# Patient Record
Sex: Female | Born: 1935 | Race: White | Hispanic: No | Smoking: Never smoker
Health system: Southern US, Community
[De-identification: ages and names within clinical notes are randomized; demographics above are authoritative.]

## PROBLEM LIST (undated history)

## (undated) DIAGNOSIS — A419 Sepsis, unspecified organism: Secondary | ICD-10-CM

## (undated) DIAGNOSIS — E039 Hypothyroidism, unspecified: Secondary | ICD-10-CM

## (undated) DIAGNOSIS — I5042 Chronic combined systolic (congestive) and diastolic (congestive) heart failure: Secondary | ICD-10-CM

## (undated) DIAGNOSIS — K219 Gastro-esophageal reflux disease without esophagitis: Secondary | ICD-10-CM

## (undated) DIAGNOSIS — R0902 Hypoxemia: Secondary | ICD-10-CM

## (undated) DIAGNOSIS — S42309A Unspecified fracture of shaft of humerus, unspecified arm, initial encounter for closed fracture: Secondary | ICD-10-CM

## (undated) DIAGNOSIS — G4700 Insomnia, unspecified: Secondary | ICD-10-CM

## (undated) DIAGNOSIS — T7840XA Allergy, unspecified, initial encounter: Secondary | ICD-10-CM

## (undated) DIAGNOSIS — I10 Essential (primary) hypertension: Secondary | ICD-10-CM

## (undated) DIAGNOSIS — E875 Hyperkalemia: Secondary | ICD-10-CM

## (undated) DIAGNOSIS — E871 Hypo-osmolality and hyponatremia: Secondary | ICD-10-CM

## (undated) DIAGNOSIS — I2510 Atherosclerotic heart disease of native coronary artery without angina pectoris: Secondary | ICD-10-CM

## (undated) DIAGNOSIS — E785 Hyperlipidemia, unspecified: Secondary | ICD-10-CM

## (undated) HISTORY — PX: CARDIAC CATHETERIZATION: SHX172

## (undated) HISTORY — DX: Insomnia, unspecified: G47.00

## (undated) HISTORY — DX: Chronic combined systolic (congestive) and diastolic (congestive) heart failure: I50.42

## (undated) HISTORY — PX: CATARACT EXTRACTION: SUR2

## (undated) HISTORY — DX: Gastro-esophageal reflux disease without esophagitis: K21.9

## (undated) HISTORY — DX: Hypothyroidism, unspecified: E03.9

## (undated) HISTORY — DX: Atherosclerotic heart disease of native coronary artery without angina pectoris: I25.10

## (undated) HISTORY — DX: Essential (primary) hypertension: I10

## (undated) HISTORY — DX: Hyperlipidemia, unspecified: E78.5

## (undated) HISTORY — DX: Allergy, unspecified, initial encounter: T78.40XA

## (undated) HISTORY — PX: OOPHORECTOMY: SHX86

## (undated) HISTORY — PX: THYROID SURGERY: SHX805

---

## 1997-10-31 ENCOUNTER — Other Ambulatory Visit: Admission: RE | Admit: 1997-10-31 | Discharge: 1997-10-31 | Payer: Self-pay | Admitting: Family Medicine

## 1998-12-24 ENCOUNTER — Other Ambulatory Visit: Admission: RE | Admit: 1998-12-24 | Discharge: 1998-12-24 | Payer: Self-pay | Admitting: Family Medicine

## 2000-07-30 ENCOUNTER — Inpatient Hospital Stay (HOSPITAL_COMMUNITY): Admission: EM | Admit: 2000-07-30 | Discharge: 2000-08-02 | Payer: Self-pay | Admitting: Emergency Medicine

## 2000-07-31 ENCOUNTER — Encounter: Payer: Self-pay | Admitting: *Deleted

## 2004-08-20 ENCOUNTER — Inpatient Hospital Stay (HOSPITAL_COMMUNITY): Admission: EM | Admit: 2004-08-20 | Discharge: 2004-08-21 | Payer: Self-pay | Admitting: Emergency Medicine

## 2004-08-20 ENCOUNTER — Ambulatory Visit: Payer: Self-pay | Admitting: Cardiology

## 2004-08-22 ENCOUNTER — Emergency Department (HOSPITAL_COMMUNITY): Admission: EM | Admit: 2004-08-22 | Discharge: 2004-08-22 | Payer: Self-pay | Admitting: Emergency Medicine

## 2004-08-28 ENCOUNTER — Ambulatory Visit: Payer: Self-pay

## 2004-09-30 ENCOUNTER — Ambulatory Visit: Payer: Self-pay | Admitting: *Deleted

## 2004-12-01 ENCOUNTER — Ambulatory Visit: Payer: Self-pay | Admitting: Family Medicine

## 2005-01-21 ENCOUNTER — Ambulatory Visit: Payer: Self-pay | Admitting: Family Medicine

## 2005-02-20 ENCOUNTER — Ambulatory Visit: Payer: Self-pay | Admitting: Family Medicine

## 2005-03-05 ENCOUNTER — Ambulatory Visit: Payer: Self-pay | Admitting: Family Medicine

## 2005-03-11 LAB — FECAL OCCULT BLOOD, GUAIAC: Fecal Occult Blood: NEGATIVE

## 2005-04-16 ENCOUNTER — Ambulatory Visit: Payer: Self-pay | Admitting: Family Medicine

## 2005-04-16 LAB — CONVERTED CEMR LAB
Hgb A1c MFr Bld: 6.2 %
TSH: 0.65 u[IU]/mL

## 2005-05-18 ENCOUNTER — Ambulatory Visit: Payer: Self-pay | Admitting: Family Medicine

## 2005-06-10 ENCOUNTER — Ambulatory Visit: Payer: Self-pay | Admitting: Family Medicine

## 2005-06-11 ENCOUNTER — Encounter: Admission: RE | Admit: 2005-06-11 | Discharge: 2005-06-11 | Payer: Self-pay | Admitting: Family Medicine

## 2005-07-02 ENCOUNTER — Other Ambulatory Visit: Admission: RE | Admit: 2005-07-02 | Discharge: 2005-07-02 | Payer: Self-pay | Admitting: Endocrinology

## 2005-09-17 ENCOUNTER — Ambulatory Visit: Payer: Self-pay | Admitting: Family Medicine

## 2007-03-29 ENCOUNTER — Encounter: Payer: Self-pay | Admitting: Family Medicine

## 2007-03-29 DIAGNOSIS — E039 Hypothyroidism, unspecified: Secondary | ICD-10-CM

## 2007-03-29 DIAGNOSIS — I2510 Atherosclerotic heart disease of native coronary artery without angina pectoris: Secondary | ICD-10-CM

## 2007-03-29 DIAGNOSIS — I447 Left bundle-branch block, unspecified: Secondary | ICD-10-CM

## 2007-03-29 DIAGNOSIS — J309 Allergic rhinitis, unspecified: Secondary | ICD-10-CM | POA: Insufficient documentation

## 2007-03-29 DIAGNOSIS — E785 Hyperlipidemia, unspecified: Secondary | ICD-10-CM

## 2007-03-29 DIAGNOSIS — E049 Nontoxic goiter, unspecified: Secondary | ICD-10-CM | POA: Insufficient documentation

## 2007-03-29 DIAGNOSIS — R7309 Other abnormal glucose: Secondary | ICD-10-CM | POA: Insufficient documentation

## 2007-03-29 DIAGNOSIS — K219 Gastro-esophageal reflux disease without esophagitis: Secondary | ICD-10-CM | POA: Insufficient documentation

## 2007-03-29 DIAGNOSIS — I10 Essential (primary) hypertension: Secondary | ICD-10-CM | POA: Insufficient documentation

## 2007-03-30 ENCOUNTER — Ambulatory Visit: Payer: Self-pay | Admitting: Family Medicine

## 2007-04-01 LAB — CONVERTED CEMR LAB
ALT: 19 units/L (ref 0–35)
AST: 25 units/L (ref 0–37)
BUN: 12 mg/dL (ref 6–23)
CO2: 27 meq/L (ref 19–32)
Calcium: 9.3 mg/dL (ref 8.4–10.5)
Chloride: 97 meq/L (ref 96–112)
Eosinophils Absolute: 0.1 10*3/uL (ref 0.0–0.6)
Eosinophils Relative: 0.7 % (ref 0.0–5.0)
GFR calc Af Amer: 89 mL/min
HDL: 29.4 mg/dL — ABNORMAL LOW (ref >39.0)
MCV: 85.1 fL (ref 78.0–100.0)
Platelets: 297 10*3/uL (ref 150–400)
Potassium: 4.3 meq/L (ref 3.5–5.1)
RBC: 3.95 M/uL (ref 3.87–5.11)
RDW: 12.7 % (ref 11.5–14.6)
TSH: 2.12 microintl units/mL (ref 0.35–5.50)
Total CHOL/HDL Ratio: 6.6
Triglycerides: 333 mg/dL (ref 0–149)
VLDL: 67 mg/dL — ABNORMAL HIGH (ref 0–40)
WBC: 7.3 10*3/uL (ref 4.5–10.5)

## 2007-04-13 ENCOUNTER — Ambulatory Visit: Payer: Self-pay | Admitting: Family Medicine

## 2007-04-14 ENCOUNTER — Encounter: Payer: Self-pay | Admitting: Family Medicine

## 2007-04-14 LAB — CONVERTED CEMR LAB
Bilirubin Urine: NEGATIVE
Casts: 0 /lpf
Protein, U semiquant: NEGATIVE
Urine crystals, microscopic: 0 /hpf

## 2007-04-15 ENCOUNTER — Encounter: Payer: Self-pay | Admitting: Family Medicine

## 2007-04-18 ENCOUNTER — Encounter: Admission: RE | Admit: 2007-04-18 | Discharge: 2007-04-18 | Payer: Self-pay | Admitting: Endocrinology

## 2007-05-03 ENCOUNTER — Ambulatory Visit: Payer: Self-pay | Admitting: Family Medicine

## 2007-05-05 ENCOUNTER — Encounter: Payer: Self-pay | Admitting: Family Medicine

## 2007-05-12 ENCOUNTER — Encounter: Payer: Self-pay | Admitting: Family Medicine

## 2007-06-02 ENCOUNTER — Ambulatory Visit: Payer: Self-pay | Admitting: Family Medicine

## 2007-06-02 DIAGNOSIS — G4700 Insomnia, unspecified: Secondary | ICD-10-CM | POA: Insufficient documentation

## 2007-12-20 ENCOUNTER — Ambulatory Visit: Payer: Self-pay | Admitting: Family Medicine

## 2007-12-23 LAB — CONVERTED CEMR LAB
Albumin: 3.9 g/dL (ref 3.5–5.2)
Alkaline Phosphatase: 104 units/L (ref 39–117)
Basophils Relative: 0.2 % (ref 0.0–1.0)
Chloride: 100 meq/L (ref 96–112)
Creatinine, Ser: 0.8 mg/dL (ref 0.4–1.2)
Eosinophils Absolute: 0.1 10*3/uL (ref 0.0–0.7)
Eosinophils Relative: 0.9 % (ref 0.0–5.0)
GFR calc Af Amer: 89 mL/min
GFR calc non Af Amer: 74 mL/min
HCT: 36.7 % (ref 36.0–46.0)
MCV: 87.8 fL (ref 78.0–100.0)
Monocytes Absolute: 0.8 10*3/uL (ref 0.1–1.0)
Phosphorus: 4 mg/dL (ref 2.3–4.6)
RBC: 4.18 M/uL (ref 3.87–5.11)
Total Protein: 7.6 g/dL (ref 6.0–8.3)
WBC: 8.5 10*3/uL (ref 4.5–10.5)

## 2008-01-05 ENCOUNTER — Telehealth: Payer: Self-pay | Admitting: Family Medicine

## 2008-01-12 ENCOUNTER — Encounter: Payer: Self-pay | Admitting: Family Medicine

## 2008-01-17 ENCOUNTER — Ambulatory Visit: Payer: Self-pay | Admitting: Family Medicine

## 2008-01-18 LAB — CONVERTED CEMR LAB
Amylase: 13 units/L — ABNORMAL LOW (ref 27–131)
Basophils Absolute: 0 10*3/uL (ref 0.0–0.1)
Basophils Relative: 0.3 % (ref 0.0–1.0)
H Pylori IgG: NEGATIVE
Lipase: 30 units/L (ref 11.0–59.0)
Lymphocytes Relative: 21.4 % (ref 12.0–46.0)
MCHC: 34.7 g/dL (ref 30.0–36.0)
Neutrophils Relative %: 67 % (ref 43.0–77.0)
Platelets: 269 10*3/uL (ref 150–400)
RBC: 3.97 M/uL (ref 3.87–5.11)
RDW: 13.2 % (ref 11.5–14.6)

## 2008-01-19 ENCOUNTER — Encounter (INDEPENDENT_AMBULATORY_CARE_PROVIDER_SITE_OTHER): Payer: Self-pay | Admitting: *Deleted

## 2008-01-24 ENCOUNTER — Telehealth (INDEPENDENT_AMBULATORY_CARE_PROVIDER_SITE_OTHER): Payer: Self-pay | Admitting: *Deleted

## 2008-01-27 ENCOUNTER — Telehealth: Payer: Self-pay | Admitting: Family Medicine

## 2008-02-07 ENCOUNTER — Telehealth (INDEPENDENT_AMBULATORY_CARE_PROVIDER_SITE_OTHER): Payer: Self-pay | Admitting: *Deleted

## 2008-02-21 ENCOUNTER — Ambulatory Visit: Payer: Self-pay | Admitting: Internal Medicine

## 2008-03-06 ENCOUNTER — Encounter: Admission: RE | Admit: 2008-03-06 | Discharge: 2008-03-06 | Payer: Self-pay | Admitting: Family Medicine

## 2008-03-06 ENCOUNTER — Ambulatory Visit: Payer: Self-pay | Admitting: Family Medicine

## 2008-03-28 ENCOUNTER — Ambulatory Visit: Payer: Self-pay | Admitting: Gastroenterology

## 2008-03-28 DIAGNOSIS — K589 Irritable bowel syndrome without diarrhea: Secondary | ICD-10-CM

## 2008-04-06 ENCOUNTER — Encounter: Payer: Self-pay | Admitting: Gastroenterology

## 2008-04-06 ENCOUNTER — Ambulatory Visit: Payer: Self-pay | Admitting: Gastroenterology

## 2008-04-11 ENCOUNTER — Encounter: Payer: Self-pay | Admitting: Gastroenterology

## 2008-04-20 ENCOUNTER — Telehealth: Payer: Self-pay | Admitting: Family Medicine

## 2008-04-20 ENCOUNTER — Telehealth (INDEPENDENT_AMBULATORY_CARE_PROVIDER_SITE_OTHER): Payer: Self-pay | Admitting: *Deleted

## 2008-04-25 ENCOUNTER — Ambulatory Visit: Payer: Self-pay | Admitting: Family Medicine

## 2008-04-25 LAB — CONVERTED CEMR LAB
Glucose, Urine, Semiquant: NEGATIVE
Specific Gravity, Urine: <1.005
pH: 5.5

## 2008-04-26 ENCOUNTER — Encounter: Payer: Self-pay | Admitting: Family Medicine

## 2008-05-28 ENCOUNTER — Ambulatory Visit: Payer: Self-pay | Admitting: Family Medicine

## 2008-06-04 ENCOUNTER — Telehealth: Payer: Self-pay | Admitting: Family Medicine

## 2008-06-18 ENCOUNTER — Telehealth (INDEPENDENT_AMBULATORY_CARE_PROVIDER_SITE_OTHER): Payer: Self-pay | Admitting: *Deleted

## 2008-06-19 ENCOUNTER — Ambulatory Visit: Payer: Self-pay | Admitting: Gastroenterology

## 2008-06-19 DIAGNOSIS — Z860100 Personal history of colon polyps, unspecified: Secondary | ICD-10-CM | POA: Insufficient documentation

## 2008-09-26 ENCOUNTER — Telehealth (INDEPENDENT_AMBULATORY_CARE_PROVIDER_SITE_OTHER): Payer: Self-pay | Admitting: Internal Medicine

## 2008-11-28 ENCOUNTER — Ambulatory Visit: Payer: Self-pay | Admitting: Family Medicine

## 2008-12-05 ENCOUNTER — Ambulatory Visit: Payer: Self-pay | Admitting: Family Medicine

## 2008-12-08 ENCOUNTER — Encounter: Payer: Self-pay | Admitting: Family Medicine

## 2008-12-10 ENCOUNTER — Telehealth: Payer: Self-pay | Admitting: Family Medicine

## 2008-12-12 ENCOUNTER — Encounter (INDEPENDENT_AMBULATORY_CARE_PROVIDER_SITE_OTHER): Payer: Self-pay | Admitting: *Deleted

## 2008-12-13 ENCOUNTER — Encounter: Payer: Self-pay | Admitting: Family Medicine

## 2008-12-17 ENCOUNTER — Ambulatory Visit: Payer: Self-pay | Admitting: Family Medicine

## 2009-02-19 ENCOUNTER — Telehealth: Payer: Self-pay | Admitting: Family Medicine

## 2009-03-06 ENCOUNTER — Telehealth: Payer: Self-pay | Admitting: Family Medicine

## 2009-03-20 ENCOUNTER — Encounter (INDEPENDENT_AMBULATORY_CARE_PROVIDER_SITE_OTHER): Payer: Self-pay | Admitting: *Deleted

## 2009-07-10 ENCOUNTER — Ambulatory Visit: Payer: Self-pay | Admitting: Family Medicine

## 2009-07-10 LAB — CONVERTED CEMR LAB
Glucose, Urine, Semiquant: NEGATIVE
Ketones, urine, test strip: NEGATIVE
Nitrite: NEGATIVE
Protein, U semiquant: NEGATIVE
RBC / HPF: 0
WBC Urine, dipstick: NEGATIVE
WBC, UA: 0 cells/hpf

## 2009-07-11 ENCOUNTER — Encounter: Payer: Self-pay | Admitting: Family Medicine

## 2009-09-10 ENCOUNTER — Telehealth: Payer: Self-pay | Admitting: Gastroenterology

## 2009-12-06 ENCOUNTER — Telehealth: Payer: Self-pay | Admitting: Family Medicine

## 2009-12-10 ENCOUNTER — Ambulatory Visit: Payer: Self-pay | Admitting: Family Medicine

## 2009-12-10 DIAGNOSIS — F419 Anxiety disorder, unspecified: Secondary | ICD-10-CM

## 2009-12-22 ENCOUNTER — Ambulatory Visit: Payer: Self-pay | Admitting: Cardiovascular Disease

## 2009-12-22 ENCOUNTER — Inpatient Hospital Stay: Payer: 59 | Admitting: Internal Medicine

## 2009-12-23 ENCOUNTER — Encounter: Payer: Self-pay | Admitting: Family Medicine

## 2009-12-31 ENCOUNTER — Encounter: Payer: Self-pay | Admitting: Family Medicine

## 2010-01-10 ENCOUNTER — Ambulatory Visit: Payer: Self-pay | Admitting: Family Medicine

## 2010-01-10 DIAGNOSIS — N390 Urinary tract infection, site not specified: Secondary | ICD-10-CM | POA: Insufficient documentation

## 2010-01-10 LAB — CONVERTED CEMR LAB
Casts: 0 /lpf
Glucose, Urine, Semiquant: NEGATIVE
Ketones, urine, test strip: NEGATIVE
Nitrite: NEGATIVE
Specific Gravity, Urine: 1.01
pH: 6.5

## 2010-01-14 ENCOUNTER — Telehealth: Payer: Self-pay | Admitting: Family Medicine

## 2010-01-21 ENCOUNTER — Encounter: Payer: Self-pay | Admitting: Family Medicine

## 2010-01-21 ENCOUNTER — Telehealth: Payer: Self-pay | Admitting: Family Medicine

## 2010-01-24 ENCOUNTER — Encounter: Payer: Self-pay | Admitting: Family Medicine

## 2010-01-27 ENCOUNTER — Telehealth: Payer: Self-pay | Admitting: Family Medicine

## 2010-03-04 ENCOUNTER — Ambulatory Visit: Payer: Self-pay | Admitting: Family Medicine

## 2010-03-04 DIAGNOSIS — K649 Unspecified hemorrhoids: Secondary | ICD-10-CM | POA: Insufficient documentation

## 2010-03-06 ENCOUNTER — Encounter: Payer: Self-pay | Admitting: Family Medicine

## 2010-03-07 LAB — CONVERTED CEMR LAB
ALT: 16 units/L (ref 0–35)
Alkaline Phosphatase: 98 units/L (ref 39–117)
Basophils Relative: 0.1 % (ref 0.0–3.0)
Bilirubin, Direct: 0.1 mg/dL (ref 0.0–0.3)
Calcium: 9.2 mg/dL (ref 8.4–10.5)
Creatinine, Ser: 0.9 mg/dL (ref 0.4–1.2)
Eosinophils Relative: 0.8 % (ref 0.0–5.0)
GFR calc non Af Amer: 66.55 mL/min (ref >60)
Glucose, Bld: 96 mg/dL (ref 70–99)
HDL: 34.2 mg/dL — ABNORMAL LOW (ref >39.00)
Lymphocytes Relative: 20 % (ref 12.0–46.0)
MCV: 86.4 fL (ref 78.0–100.0)
Monocytes Absolute: 0.8 10*3/uL (ref 0.1–1.0)
Monocytes Relative: 12.4 % — ABNORMAL HIGH (ref 3.0–12.0)
Neutrophils Relative %: 66.7 % (ref 43.0–77.0)
Phosphorus: 3.8 mg/dL (ref 2.3–4.6)
Platelets: 288 10*3/uL (ref 150.0–400.0)
Potassium: 4.3 meq/L (ref 3.5–5.1)
RBC: 3.87 M/uL (ref 3.87–5.11)
Sodium: 129 meq/L — ABNORMAL LOW (ref 135–145)
Total Bilirubin: 0.4 mg/dL (ref 0.3–1.2)
Total CHOL/HDL Ratio: 5
Total Protein: 7 g/dL (ref 6.0–8.3)
Triglycerides: 317 mg/dL — ABNORMAL HIGH (ref 0.0–149.0)
VLDL: 63.4 mg/dL — ABNORMAL HIGH (ref 0.0–40.0)
WBC: 6.8 10*3/uL (ref 4.5–10.5)

## 2010-03-10 ENCOUNTER — Telehealth: Payer: Self-pay | Admitting: Family Medicine

## 2010-03-10 ENCOUNTER — Encounter: Payer: Self-pay | Admitting: Family Medicine

## 2010-03-25 ENCOUNTER — Ambulatory Visit: Payer: Self-pay | Admitting: Family Medicine

## 2010-03-25 DIAGNOSIS — E871 Hypo-osmolality and hyponatremia: Secondary | ICD-10-CM | POA: Insufficient documentation

## 2010-03-26 LAB — CONVERTED CEMR LAB: Chloride: 94 meq/L — ABNORMAL LOW (ref 96–112)

## 2010-04-09 ENCOUNTER — Ambulatory Visit: Payer: Self-pay | Admitting: Family Medicine

## 2010-04-09 DIAGNOSIS — D649 Anemia, unspecified: Secondary | ICD-10-CM

## 2010-04-09 DIAGNOSIS — D126 Benign neoplasm of colon, unspecified: Secondary | ICD-10-CM

## 2010-05-26 ENCOUNTER — Encounter (INDEPENDENT_AMBULATORY_CARE_PROVIDER_SITE_OTHER): Payer: Self-pay | Admitting: *Deleted

## 2010-05-26 ENCOUNTER — Ambulatory Visit: Payer: Self-pay | Admitting: Gastroenterology

## 2010-06-25 ENCOUNTER — Telehealth: Payer: Self-pay | Admitting: Gastroenterology

## 2010-06-25 ENCOUNTER — Ambulatory Visit: Payer: Self-pay | Admitting: Family Medicine

## 2010-06-25 LAB — CONVERTED CEMR LAB
Nitrite: NEGATIVE
Specific Gravity, Urine: <1.005
Urobilinogen, UA: 0.2
pH: 7.5

## 2010-06-26 ENCOUNTER — Encounter: Payer: Self-pay | Admitting: Family Medicine

## 2010-07-10 ENCOUNTER — Telehealth: Payer: Self-pay | Admitting: Family Medicine

## 2010-08-13 ENCOUNTER — Encounter: Payer: Self-pay | Admitting: Gastroenterology

## 2010-08-13 ENCOUNTER — Ambulatory Visit
Admission: RE | Admit: 2010-08-13 | Discharge: 2010-08-13 | Payer: Self-pay | Source: Home / Self Care | Attending: Gastroenterology | Admitting: Gastroenterology

## 2010-08-13 LAB — HM COLONOSCOPY

## 2010-08-18 ENCOUNTER — Telehealth: Payer: Self-pay | Admitting: Family Medicine

## 2010-09-04 NOTE — Miscellaneous (Signed)
Summary: med list updated  Clinical Lists Changes  Medications: Changed medication from HYDROCHLOROTHIAZIDE 25 MG TABS (HYDROCHLOROTHIAZIDE) 1 by mouth two times a day to HYDROCHLOROTHIAZIDE 25 MG TABS (HYDROCHLOROTHIAZIDE) 1 by mouth once  a day     Current Allergies: ! * LISINOPRIL ! CIPRO ! KEFLEX PENICILLIN * TETANUS SULFA

## 2010-09-04 NOTE — Letter (Signed)
[facility]   Imported By: Beau Fanny 01/15/2010 08:30:34  _____________________________________________________________________  External Attachment:    Type:   Image     Comment:   External Document

## 2010-09-04 NOTE — Miscellaneous (Signed)
  Clinical Lists Changes  Medications: Removed medication of MACROBID 100 MG CAPS (NITROFURANTOIN MONOHYD MACRO) 1 by mouth two times a day for 7days for urinary infection Added new medication of KEFLEX 500 MG CAPS (CEPHALEXIN) take one tablet 4 times daily - Signed Rx of KEFLEX 500 MG CAPS (CEPHALEXIN) take one tablet 4 times daily;  #40 x 0;  Signed;  Entered by: Benny Lennert CMA (AAMA);  Authorized by: Hannah Beat MD;  Method used: [facility], [place], [place]  [address], Ph: [phone] or [phone], Fax: [phone]    Prescriptions: KEFLEX 500 MG CAPS (CEPHALEXIN) take one tablet 4 times daily  #40 x 0   Entered by:   Benny Lennert CMA (AAMA)   Authorized by:   Hannah Beat MD   Signed by:   Benny Lennert CMA (AAMA) on 01/24/2010   Method used:   [facility]ronically to        [id number] (retail)       [address]       [place], [place]  [address]       Ph: [phone] or [phone]       Fax: [phone]   RxID:   [id number]   Prior Medications: HYDROCHLOROTHIAZIDE 25 MG TABS (HYDROCHLOROTHIAZIDE) 1 by mouth two times a day LOPRESSOR 50 MG  TABS (METOPROLOL TARTRATE) one half by mouth two times a day ADULT ASPIRIN LOW STRENGTH 81 MG  TBDP (ASPIRIN) one by mouth daily SYNTHROID 75 MCG  TABS (LEVOTHYROXINE SODIUM) one by mouth once daily ZYRTEC ALLERGY 10 MG  TABS (CETIRIZINE HCL) 1po once daily as needed COZAAR 50 MG  TABS (LOSARTAN POTASSIUM) 1 by mouth each am AMBIEN 10 MG  TABS (ZOLPIDEM TARTRATE) 1/2 to 1 by mouth at bedtime as needed MULTIVITAMINS  TABS (MULTIPLE VITAMIN) Take 1 tablet by mouth once a day PROTONIX 40 MG TBEC (PANTOPRAZOLE SODIUM)  MUCINEX 600 MG XR12H-TAB (GUAIFENESIN) OTC As directed. ASTEPRO 0.15 % SOLN (AZELASTINE HCL) 1-2 sprays in each nostril two times a day for allergies TYLENOL EXTRA STRENGTH 500 MG TABS (ACETAMINOPHEN) OTC As directed. PROMETHAZINE HCL  12.5 MG TABS (PROMETHAZINE HCL) 1-2 by mouth up to three times a day as needed nausea- caution of sedation Current Allergies: ! * LISINOPRIL ! CIPRO PENICILLIN * TETANUS SULFA

## 2010-09-04 NOTE — Assessment & Plan Note (Signed)
Summary: F/U KIDNEY PROBLEMS-ODOR IN URINE/CLE   Vital Signs:  Patient profile:   77 year old female Height:      63 inches Weight:      187.25 pounds BMI:     33.29 Temp:     98 degrees F oral Pulse rate:   72 / minute Pulse rhythm:   regular BP sitting:   170 / 90  (left arm) Cuff size:   large  Vitals Entered By: Lewanda Rife LPN (January 10, 2010 4:38 PM) CC: Urine has odor and burns when urinates with frequency of urine and feeling of pressure in lower abdomen.   History of Present Illness: urine smells bad for 2-3 weeks burns to urinate  is hurting above her bladder  no blood in urine  low back has been hurting  some nausea/ no vom no fever   also some hemorroids this week - they act up and hurt occas  was in hosp in may for chest pain -- and all heart tests neg  is nervous in general and does not know why   bp is high today  took her med thinks it may be from nervousness  wants to check it again   Allergies: 1)  ! * Lisinopril 2)  ! Cipro 3)  Penicillin 4)  * Tetanus 5)  Sulfa  Past History:  Past Medical History: Last updated: 04/12/2008 Allergic rhinitis Coronary artery disease GERD Hyperlipidemia Hypertension Hypothyroidism insomnia Asthma  GI-- Dr Christella Hartigan  Past Surgical History: Last updated: 04/12/2008 Oophorectomy Thyroid surgery 9/09 colonoscopy adenomatous colon polyp-- re check 1 y  Family History: Last updated: 03/28/2008 Father:  Mother: heart problems, HTN Siblings:  No FH of Colon Cancer:  Social History: Last updated: 01/17/2008 Marital Status: widowed Children: 2 Occupation: retired non smoker no alcohol or caffiene  Risk Factors: Smoking Status: never (03/29/2007)  Review of Systems General:  Complains of fatigue; denies chills, fever, loss of appetite, and malaise. Eyes:  Denies blurring and eye irritation. CV:  Denies chest pain or discomfort, lightheadness, palpitations, and shortness of breath with  exertion. Resp:  Complains of excessive snoring; denies cough, shortness of breath, and wheezing. GI:  Denies abdominal pain, bloody stools, change in bowel habits, and vomiting. GU:  Complains of dysuria and urinary frequency; denies hematuria. Derm:  Denies itching, lesion(s), poor wound healing, and rash. Neuro:  Denies numbness and tingling. Psych:  Complains of anxiety; denies panic attacks, sense of great danger, and suicidal thoughts/plans. Endo:  Denies cold intolerance and heat intolerance. Heme:  Denies abnormal bruising and bleeding.  Physical Exam  General:  overweight but generally well appearing  Head:  normocephalic, atraumatic, and no abnormalities observed.   Eyes:  vision grossly intact, pupils equal, pupils round, and pupils reactive to light.   Mouth:  pharynx pink and moist.   Neck:  supple with full rom and no masses or thyromegally, no JVD or carotid bruit  Chest Wall:  No deformities, masses, or tenderness noted. Lungs:  Normal respiratory effort, chest expands symmetrically. Lungs are clear to auscultation, no crackles or wheezes. Heart:  Normal rate and regular rhythm. S1 and S2 normal without gallop, murmur, click, rub or other extra sounds. Abdomen:  suprapubic tenderness without rebound or gaurding soft, normal bowel sounds, no hepatomegaly, and no splenomegaly.   Msk:  no CVA tenderness  Pulses:  nl perfusion Extremities:  No clubbing, cyanosis, edema, or deformity noted with normal full range of motion of all joints.   Neurologic:  sensation intact to light touch, gait normal, and DTRs symmetrical and normal.   Skin:  Intact without suspicious lesions or rashes Cervical Nodes:  No lymphadenopathy noted Inguinal Nodes:  No significant adenopathy Psych:  seems nervous today- but pleasant and conversative    Impression & Recommendations:  Problem # 1:  UTI (ICD-599.0) Assessment New will cover with macrobid and cx urine  enc good fluid intake update if  worse or not imp f/u 1 mo  Her updated medication list for this problem includes:    Macrobid 100 Mg Caps (Nitrofurantoin monohyd macro) .Marland Kitchen... 1 by mouth two times a day for 7days for urinary infection  Orders: T-Culture, Urine ([id number]) Prescription Created Electronically [phone]) UA Dipstick W/ Micro (manual) ([id number])  Problem # 2:  HYPERTENSION (ICD-401.9) Assessment: Deteriorated  bp high today- pt thinks from anxiety and the uti f/u 1 mo to re check disc low salt diet / good water intake  if not imp may need to inc cozaar  Her updated medication list for this problem includes:    Hydrochlorothiazide 25 Mg Tabs (Hydrochlorothiazide) .Marland Kitchen... 1 by mouth two times a day    Lopressor 50 Mg Tabs (Metoprolol tartrate) ..... One half by mouth two times a day    Cozaar 50 Mg Tabs (Losartan potassium) .Marland Kitchen... 1 by mouth each am  BP today: 170/90 Prior BP: 148/80 (12/10/2009)  Labs Reviewed: K+: 3.7 (12/20/2007) Creat: : 0.8 (12/20/2007)   Chol: 199 (12/20/2007)   HDL: 32.0 (12/20/2007)   LDL: DEL (12/20/2007)   TG: 296 (12/20/2007)  Complete Medication List: 1)  Hydrochlorothiazide 25 Mg Tabs (Hydrochlorothiazide) .Marland Kitchen.. 1 by mouth two times a day 2)  Lopressor 50 Mg Tabs (Metoprolol tartrate) .... One half by mouth two times a day 3)  Adult Aspirin Low Strength 81 Mg Tbdp (Aspirin) .... One by mouth daily 4)  Synthroid 75 Mcg Tabs (Levothyroxine sodium) .... One by mouth once daily 5)  Zyrtec Allergy 10 Mg Tabs (Cetirizine hcl) .Marland Kitchen.. 1po once daily as needed 6)  Cozaar 50 Mg Tabs (Losartan potassium) .Marland Kitchen.. 1 by mouth each am 7)  Ambien 10 Mg Tabs (Zolpidem tartrate) .... 1/2 to 1 by mouth at bedtime as needed 8)  Multivitamins Tabs (Multiple vitamin) .... Take 1 tablet by mouth once a day 9)  Protonix 40 Mg Tbec (Pantoprazole sodium) 10)  Mucinex 600 Mg Xr12h-tab (Guaifenesin) .... Otc as directed. 11)  Astepro 0.15 % Soln (Azelastine hcl) .Marland Kitchen.. 1-2 sprays in each nostril two times a day  for allergies 12)  Tylenol Extra Strength 500 Mg Tabs (Acetaminophen) .... Otc as directed. 13)  Macrobid 100 Mg Caps (Nitrofurantoin monohyd macro) .Marland Kitchen.. 1 by mouth two times a day for 7days for urinary infection  Patient Instructions: 1)  continue drinking lots of water 2)  call or seek care is symptoms don't improve in 2-3 days or if you develop back pain, nausea, or vomiting  3)  take the macrobid (nitrofurantoin) as directed  4)  I will update you when culture returns 5)  go back to protonix if it works ok  6)  follow up with me in 1 month since your blood pressure is high  Prescriptions: MACROBID 100 MG CAPS (NITROFURANTOIN MONOHYD MACRO) 1 by mouth two times a day for 7days for urinary infection  #14 x 0   Entered and Authorized by:   Judith Part MD   Signed by:   Judith Part MD on 01/12/2010   Method used:  [facility]        [facility] (retail)       [address]       [place], [place]  [address]       Ph: [phone] or [phone]       Fax: [phone]   RxID:   [id number] SYNTHROID 75 MCG  TABS (LEVOTHYROXINE SODIUM) one by mouth once daily  #90 x 3   Entered and Authorized by:   Judith Part MD   Signed by:   Judith Part MD on 01/12/2010   Method used:   Historical   RxID:   [id number] HYDROCHLOROTHIAZIDE 25 MG TABS (HYDROCHLOROTHIAZIDE) 1 by mouth two times a day  #180 x 3   Entered and Authorized by:   Judith Part MD   Signed by:   Judith Part MD on 01/12/2010   Method used:   Historical   RxID:   [id number] AMBIEN 10 MG  TABS (ZOLPIDEM TARTRATE) 1/2 to 1 by mouth at bedtime as needed  #30 x 5   Entered and Authorized by:   Judith Part MD   Signed by:   Judith Part MD on 01/10/2010   Method used:   Print then Give to Patient   RxID:   [id number] MACROBID 100 MG CAPS (NITROFURANTOIN MONOHYD MACRO) 1 by mouth two times a day for 7days for urinary infection  #14 x 0   Entered and  Authorized by:   Judith Part MD   Signed by:   Judith Part MD on 01/10/2010   Method used:   [facility]ron[facility]        [facility] (retail)       [address]       [place], [place]  [address]       Ph: [phone] or [phone]       Fax: [phone]   RxID:   [id number]   Current Allergies (reviewed today): ! * LISINOPRIL ! CIPRO PENICILLIN * TETANUS SULFA  Laboratory Results   Urine Tests  Date/Time Received: January 10, 2010 4:39 PM  Date/Time Reported: January 10, 2010 4:40 PM   Routine Urinalysis   Color: yellow Appearance: Hazy Glucose: negative   (Normal Range: Negative) Bilirubin: negative   (Normal Range: Negative) Ketone: negative   (Normal Range: Negative) Spec. Gravity: 1.010   (Normal Range: 1.003-1.035) Blood: trace-intact   (Normal Range: Negative) pH: 6.5   (Normal Range: 5.0-8.0) Protein: trace   (Normal Range: Negative) Urobilinogen: 0.2   (Normal Range: 0-1) Nitrite: negative   (Normal Range: Negative) Leukocyte Esterace: small   (Normal Range: Negative)  Urine Microscopic WBC/HPF: many RBC/HPF: 2-3 Bacteria/HPF: mod Mucous/HPF: few Epithelial/HPF: 2-3 Crystals/HPF: 0 Casts/LPF: 0 Yeast/HPF: 0 Other: 0

## 2010-09-04 NOTE — Progress Notes (Signed)
Summary: Nausea with Macrobid  Phone Note Call from Patient Call back at Home Phone [phone]   Caller: Patient Call For: Judith Part MD Summary of Call: Patient says the Macrobid is making her very nauseated.  She hasn't vomited but she is very uncomfortable with the nausea.  She is taking it with food.  She asks if there is anything she can do to help with the nausea.  She says she'll try to bear it if she needs to. Initial call taken by: Delilah Shan CMA Duncan Dull),  January 14, 2010 9:57 AM  Follow-up for Phone Call        other option is keflex -- which is a risk of all rxn-- would rather have her finish the macrobid if poss  can try a little phenergan for nausea -- warn may sedate/ use caution px written on EMR for call in  update if not imp Follow-up by: Judith Part MD,  January 14, 2010 11:24 AM  Additional Follow-up for Phone Call Additional follow up Details #1::        Patient Advised.  Additional Follow-up by: Delilah Shan CMA (AAMA),  January 14, 2010 11:27 AM    New/Updated Medications: PROMETHAZINE HCL 12.5 MG TABS (PROMETHAZINE HCL) 1-2 by mouth up to three times a day as needed nausea- caution of sedation Prescriptions: PROMETHAZINE HCL 12.5 MG TABS (PROMETHAZINE HCL) 1-2 by mouth up to three times a day as needed nausea- caution of sedation  #20 x 0   Entered by:   Delilah Shan CMA (AAMA)   Authorized by:   Judith Part MD   Signed by:   Delilah Shan CMA (AAMA) on 01/14/2010   Method used:   [facility]ronically to        [facility] [facility]retail)       [address]       [place], [place]  [address]       Ph: [phone] or [phone]       Fax: [phone]   RxID:   [id number]

## 2010-09-04 NOTE — Miscellaneous (Signed)
Summary: med list updated  Clinical Lists Changes  Medications: Added new medication of COZAAR 50 MG TABS (LOSARTAN POTASSIUM) Take 1 tablet by mouth once a day Removed medication of COZAAR 100 MG TABS (LOSARTAN POTASSIUM) 1 by mouth once daily     Current Allergies: ! * LISINOPRIL ! CIPRO ! KEFLEX PENICILLIN * TETANUS SULFA

## 2010-09-04 NOTE — Progress Notes (Signed)
Summary: Pt is allergic to Keflex  Phone Note Call from Patient Call back at [phone]   Caller: Patient Call For: Judith Part MD Summary of Call: Dr Patsy Lager called in Keflex for UTI on 01/24/10. Pt did not start Keflex until Sun 01/26/10. Pt took 2 pills on Sunday of Keflex and on Sun night pt developed rash on arms and legs with burning sensation and itching. Pt took last Keflex on Sun night. Pt is still having burning when urinates on and off and pt states she hurts all over. No fever..[facility] if pharmacy needed. [phone]. Please advise.  Initial call taken by: Rena Isley LPN,  January 27, 2010 12:38 PM  Follow-up for Phone Call        will again note on allergy list  this will be tough to treat since she is allergic to so many things  we could try levaquin (same family as cipro -- I know she had rxn to that in past ) or could try sulfa (chart says that makes her feel bad but not dangerous reaction )  would she be willing to try either of these?  Follow-up by: Telena Peyser Ann Morningstar Toft MD,  January 27, 2010 1:16 PM  Additional Follow-up for Phone Call Additional follow up Details #1::        Patient notified as instructed by telephone. Pt said she has tried Levaquin before and does not think had any problem with the Levaquin. Pt will try Levaquin.Rena Isley LPN  January 27, 2010 1:21 PM    New Allergies: ! KEFLEX Additional Follow-up for Phone Call Additional follow up Details #2::    thanks  px written on EMR for call in - levaquin update me if no relief with this  Follow-up by: Danialle Dement Ann Leila Schuff MD,  January 27, 2010 2:12 PM  Additional Follow-up for Phone Call Additional follow up Details #3:: Details for Additional Follow-up Action Taken: Patient notified as instructed by telephone. Medication [facility]oned [facility] as instructed. Tried to leave on pharmacist v/m but phone started cutting out. Icalled pharmacist and verified he did receive rx for Levaquin 250mg. Pharamcist did  receive.; Rena Isley LPN  January 27, 2010 3:04 PM   New/Updated Medications: LEVAQUIN 250 MG TABS (LEVOFLOXACIN) 1 by mouth once daily for 7 days New Allergies: ! KEFLEXPrescriptions: LEVAQUIN 250 MG TABS (LEVOFLOXACIN) 1 by mouth once daily for 7 days  #7 x 0   Entered and Authorized by:   Lou Irigoyen Ann La Dibella MD   Signed by:   Rena Isley LPN on 01/27/2010   Method used:   Telephoned to ...       [facility] [id number]ail)       [address]       [place], [place]  [address]       Ph: [phone] or [phone]       Fax: [phone]   RxID:   [id number]   Current Allergies (reviewed today): ! * LISINOPRIL ! CIPRO ! KEFLEX PENICILLIN * TETANUS SULFA

## 2010-09-04 NOTE — Progress Notes (Signed)
Summary: Schedule Colonoscopy  Phone Note Outgoing Call Call back at [facility] Phone [phone]   Call placed by: Harlow Mares CMA Duncan Dull),  September 10, 2009 10:02 AM Call placed to: Patient Summary of Call: spoke to patient advised her time to have a colonoscopy and advised her the importance. She declined, states that she will call back if she decided.  Initial call taken by: Harlow Mares CMA (AAMA),  September 10, 2009 10:03 AM

## 2010-09-04 NOTE — Assessment & Plan Note (Signed)
Summary: ALLERGIES/CLE   Vital Signs:  Patient profile:   77 year old female Height:      63 inches Weight:      185.75 pounds BMI:     33.02 Temp:     98.2 degrees F oral Pulse rate:   68 / minute Pulse rhythm:   regular BP sitting:   148 / 80  (left arm) Cuff size:   large  Vitals Entered By: Lewanda Rife LPN (Dec 10, 2009 12:07 PM) CC: allergies, head stopped up, drainage at back of throat and h/a   History of Present Illness: horrible allergies  head feels like it is in a barrel and cannot hear well  ever since tree pollen season started  sneezing and runny and stuffy nose -- more stuffy and drainage in throat  does not think it is a cold   throat is sore   no fever or facial pain  some bad headaches  at times throat feels swollen  no new meds or foods    on zyrtec  on mucinex  saline nasal spray   in past flonase gave her bad headaches   more reflux- UC dr changed her to nexium      Allergies: 1)  ! * Lisinopril 2)  ! Cipro 3)  Penicillin 4)  * Tetanus 5)  Sulfa  Past History:  Past Medical History: Last updated: 04/12/2008 Allergic rhinitis Coronary artery disease GERD Hyperlipidemia Hypertension Hypothyroidism insomnia Asthma  GI-- Dr Christella Hartigan  Past Surgical History: Last updated: 04/12/2008 Oophorectomy Thyroid surgery 9/09 colonoscopy adenomatous colon polyp-- re check 1 y  Family History: Last updated: 03/28/2008 Father:  Mother: heart problems, HTN Siblings:  No FH of Colon Cancer:  Social History: Last updated: 01/17/2008 Marital Status: widowed Children: 2 Occupation: retired non smoker no alcohol or caffiene  Risk Factors: Smoking Status: never (03/29/2007)  Review of Systems General:  Denies chills, fatigue, fever, loss of appetite, and malaise. Eyes:  Denies discharge and eye pain. ENT:  Complains of earache, nasal congestion, postnasal drainage, and sinus pressure; denies sore throat. CV:  Denies chest pain or  discomfort, lightheadness, palpitations, and shortness of breath with exertion. Resp:  Complains of cough. GI:  Complains of indigestion; denies change in bowel habits, nausea, and vomiting. MS:  Denies joint pain. Derm:  Denies itching, lesion(s), poor wound healing, and rash. Neuro:  Complains of headaches; denies numbness and tingling. Psych:  Complains of anxiety; denies panic attacks, sense of great danger, and suicidal thoughts/plans. Endo:  Denies cold intolerance and heat intolerance. Heme:  Denies abnormal bruising and bleeding. Allergy:  Complains of itching eyes, seasonal allergies, and sneezing; denies hives or rash.  Physical Exam  General:  overweight but generally well appearing  Head:  normocephalic, atraumatic, and no abnormalities observed.  no facial tenderness  Eyes:  vision grossly intact, pupils equal, pupils round, and pupils reactive to light.  mild conj injection Ears:  R ear normal and L ear normal.   Nose:  constantly sniffling nares boggy and pale  Mouth:  pharynx pink and moist, no erythema, and no exudates.  no swelling scant clear post nasal drip Neck:  No deformities, masses, or tenderness noted. Lungs:  Normal respiratory effort, chest expands symmetrically. Lungs are clear to auscultation, no crackles or wheezes. Heart:  Normal rate and regular rhythm. S1 and S2 normal without gallop, murmur, click, rub or other extra sounds. Neurologic:  sensation intact to light touch, gait normal, and DTRs symmetrical and normal.  Skin:  Intact without suspicious lesions or rashes Cervical Nodes:  No lymphadenopathy noted Psych:  normal affect, talkative and pleasant  does not seem overly anx- but is fatigued    Impression & Recommendations:  Problem # 1:  ALLERGIC RHINITIS (ICD-477.9) Assessment Deteriorated getting much worse with time -- (used to take shots)-- seasonal from spring through fall  no wheeze / but often feels like throat is swollen  non tol of  nasal steroids trial of astepro given -- will update allergist ref  Her updated medication list for this problem includes:    Zyrtec Allergy 10 Mg Tabs (Cetirizine hcl) .Marland Kitchen... 1po once daily as needed    Flonase 50 Mcg/act Susp (Fluticasone propionate) .Marland Kitchen... 2 sprays in each nostril once daily as needed    Astepro 0.15 % Soln (Azelastine hcl) .Marland Kitchen... 1-2 sprays in each nostril two times a day for allergies  Orders: Allergy Referral  (Allergy)  Problem # 2:  ANXIETY (ICD-300.00) Assessment: Deteriorated this is worse lately- pt thinks secondary to feeling bad from allergies will f/u after all appt to disc in more detail - and also check thyroid and chronic problems  Complete Medication List: 1)  Hydrochlorothiazide 25 Mg Tabs (Hydrochlorothiazide) .Marland Kitchen.. 1 by mouth two times a day 2)  Lopressor 50 Mg Tabs (Metoprolol tartrate) .... One half by mouth two times a day 3)  Adult Aspirin Low Strength 81 Mg Tbdp (Aspirin) .... One by mouth daily 4)  Synthroid 75 Mcg Tabs (Levothyroxine sodium) .... One by mouth once daily 5)  Zyrtec Allergy 10 Mg Tabs (Cetirizine hcl) .Marland Kitchen.. 1po once daily as needed 6)  Cozaar 50 Mg Tabs (Losartan potassium) .Marland Kitchen.. 1 by mouth each am 7)  Ambien 10 Mg Tabs (Zolpidem tartrate) .... 1/2 to 1 by mouth at bedtime as needed 8)  Flonase 50 Mcg/act Susp (Fluticasone propionate) .... 2 sprays in each nostril once daily as needed 9)  Multivitamins Tabs (Multiple vitamin) .... Take 1 tablet by mouth once a day 10)  Nexium 40 Mg Cpdr (Esomeprazole magnesium) .... Take one capsule daily as needed 11)  Mucinex 600 Mg Xr12h-tab (Guaifenesin) .... Otc as directed. 12)  Astepro 0.15 % Soln (Azelastine hcl) .Marland Kitchen.. 1-2 sprays in each nostril two times a day for allergies  Patient Instructions: 1)  we will refer you to allergist at check out  2)  add the astepro as directed - if the samples help you can call for a px  3)  follow up after your allergy appt with me to discuss anxiety  4)   continue the zyrtec continue plain mucinex if it helps   Current Allergies (reviewed today): ! * LISINOPRIL ! CIPRO PENICILLIN * TETANUS SULFA

## 2010-09-04 NOTE — Progress Notes (Signed)
Summary: Update on pt's condition  Phone Note Call from Patient Call back at [phone]   Caller: Patient Call For: Judith Part MD Summary of Call: Pt called to update BP and how she is feeling. Pt said she is having no dizziness today and does have slight h/a (not as bad as last week) Pt thinks this h/a is sinus and allergy related. Pt is taking cozaar 50mg  one daily and HCTZ 25mg  one daily. Todays BP at home this AM was 128/55 and this afternoon BP was 118/49 with 62 pulse. Pt uses [facility] if pharmacy needed. Please advise.  Initial call taken by: Lewanda Rife LPN,  March 10, 2010 2:42 PM  Follow-up for Phone Call        blood pressures sound good - continue with that and f/u as schedule-- update me if any changes in meantime Follow-up by: Judith Part MD,  March 10, 2010 4:49 PM  Additional Follow-up for Phone Call Additional follow up Details #1::        Patient notified as instructed by telephone. Lewanda Rife LPN  March 10, 2010 5:17 PM

## 2010-09-04 NOTE — Progress Notes (Signed)
Summary: need order for urine culture  Phone Note Call from Patient   Caller: Patient Summary of Call: Pt is here to leave urine sample for follow up culture.  Dr. Royden Purl pt.  Can you please order culture? Initial call taken by: Lowella Petties CMA,  January 21, 2010 10:42 AM  Follow-up for Phone Call        Order received from Dr. Patsy Lager. Follow-up by: Lowella Petties CMA,  January 21, 2010 11:22 AM     Appended Document: need order for urine culture

## 2010-09-04 NOTE — Consult Note (Signed)
[facility]   Imported By: Lennie Odor 01/10/2010 15:07:51  _____________________________________________________________________  External Attachment:    Type:   Image     Comment:   External Document

## 2010-09-04 NOTE — Assessment & Plan Note (Signed)
Summary: BP CHECK PER DR TOWER/RI   Vital Signs:  Patient profile:   77 year old female Height:      63 inches Weight:      184.25 pounds BMI:     32.76 Temp:     98 degrees F oral Pulse rate:   76 / minute Pulse rhythm:   regular BP sitting:   170 / 86  (left arm) Cuff size:   large  Vitals Entered By: Lewanda Rife LPN (March 04, 2010 3:26 PM) CC: follow-up visit to rk BP   History of Present Illness: here for f/u BP - was high when sick at last visit   still hightoday at 170/86-- not much different   not feeling great - stomach is bothering her as is her hemorroids  has prolapsed internal hemorroids that so not bleed  they hurt and she can feel them  gets constipated -- just started fiber pills at times diarrea   did better with nexium-- protonix does not agree with her -- causes the bowel changes   at home her cuff is broken  bp started running high since in hospital   once in a while a mild headache -- not often at all  ankles tend to swell     Allergies: 1)  ! * Lisinopril 2)  ! Cipro 3)  ! Keflex 4)  Penicillin 5)  * Tetanus 6)  Sulfa  Past History:  Past Medical History: Last updated: 04/12/2008 Allergic rhinitis Coronary artery disease GERD Hyperlipidemia Hypertension Hypothyroidism insomnia Asthma  GI-- Dr Christella Hartigan  Past Surgical History: Last updated: 04/12/2008 Oophorectomy Thyroid surgery 9/09 colonoscopy adenomatous colon polyp-- re check 1 y  Family History: Last updated: 03/28/2008 Father:  Mother: heart problems, HTN Siblings:  No FH of Colon Cancer:  Social History: Last updated: 01/17/2008 Marital Status: widowed Children: 2 Occupation: retired non smoker no alcohol or caffiene  Risk Factors: Smoking Status: never (03/29/2007)  Review of Systems General:  Denies fatigue, fever, loss of appetite, and malaise. Eyes:  Denies blurring and eye irritation. CV:  Denies chest pain or discomfort, palpitations, and  shortness of breath with exertion. Resp:  Denies cough, shortness of breath, and wheezing. GI:  Denies abdominal pain, change in bowel habits, hemorrhoids, and indigestion. GU:  Denies dysuria and urinary frequency. MS:  Denies muscle aches, muscle, and cramps. Derm:  Denies itching, lesion(s), poor wound healing, and rash. Neuro:  Complains of headaches; denies poor balance and tingling. Psych:  Denies anxiety and depression. Endo:  Denies excessive thirst and excessive urination. Heme:  Denies abnormal bruising and bleeding.  Physical Exam  General:  overweight but generally well appearing  Head:  normocephalic, atraumatic, and no abnormalities observed.   Eyes:  vision grossly intact, pupils equal, pupils round, and pupils reactive to light.   Mouth:  pharynx pink and moist.   Neck:  supple with full rom and no masses or thyromegally, no JVD or carotid bruit  Lungs:  Normal respiratory effort, chest expands symmetrically. Lungs are clear to auscultation, no crackles or wheezes. Heart:  Normal rate and regular rhythm. S1 and S2 normal without gallop, murmur, click, rub or other extra sounds. Abdomen:  Bowel sounds positive,abdomen soft and non-tender without masses, organomegaly or hernias noted. no renal bruits  Msk:  no CVA tenderness  Pulses:  nl perfusion Extremities:  No clubbing, cyanosis, edema, or deformity noted with normal full range of motion of all joints.   Neurologic:  sensation intact to  light touch, gait normal, and DTRs symmetrical and normal.  no renal bruits Skin:  Intact without suspicious lesions or rashes Cervical Nodes:  No lymphadenopathy noted Inguinal Nodes:  No significant adenopathy Psych:  normal affect, talkative and pleasant    Impression & Recommendations:  Problem # 1:  GERD (ICD-530.81) Assessment Unchanged protonix is giving her side eff of inc ibs symptoms - so will change back to nexium px written for mail in  The following medications were  removed from the medication list:    Protonix 40 Mg Tbec (Pantoprazole sodium) .Marland Kitchen... Take 1 tablet by mouth once a day Her updated medication list for this problem includes:    Nexium 40 Mg Cpdr (Esomeprazole magnesium) .Marland Kitchen... 1 by mouth once daily  Problem # 2:  HYPERTENSION (ICD-401.9) Assessment: Deteriorated  bp continues to be elevated -- will inc cozaar to 100 mg update if side eff disc healthy diet (low simple sugar/ choose complex carbs/ low sat fat) diet and exercise in detail  disc low sodium diet  lab today f/u 1 mo  Her updated medication list for this problem includes:    Hydrochlorothiazide 25 Mg Tabs (Hydrochlorothiazide) .Marland Kitchen... 1 by mouth two times a day    Lopressor 50 Mg Tabs (Metoprolol tartrate) ..... One half by mouth two times a day    Cozaar 100 Mg Tabs (Losartan potassium) .Marland Kitchen... 1 by mouth once daily  Orders: Venipuncture (04540) TLB-Lipid Panel (80061-LIPID) TLB-Renal Function Panel (80069-RENAL) TLB-CBC Platelet - w/Differential (85025-CBCD) TLB-Hepatic/Liver Function Pnl (80076-HEPATIC) TLB-TSH (Thyroid Stimulating Hormone) (84443-TSH)  BP today: 170/86 Prior BP: 170/90 (01/10/2010)  Labs Reviewed: K+: 3.7 (12/20/2007) Creat: : 0.8 (12/20/2007)   Chol: 199 (12/20/2007)   HDL: 32.0 (12/20/2007)   LDL: DEL (12/20/2007)   TG: 296 (12/20/2007)  Problem # 3:  HEMORRHOIDS (ICD-455.6) Assessment: New primarily external and painful enc pt to avoid straining  anusol hc px - this has worked well in past  update if not imp in 1-2 wk  Complete Medication List: 1)  Hydrochlorothiazide 25 Mg Tabs (Hydrochlorothiazide) .Marland Kitchen.. 1 by mouth two times a day 2)  Lopressor 50 Mg Tabs (Metoprolol tartrate) .... One half by mouth two times a day 3)  Adult Aspirin Low Strength 81 Mg Tbdp (Aspirin) .... One by mouth daily 4)  Synthroid 75 Mcg Tabs (Levothyroxine sodium) .... One by mouth once daily 5)  Zyrtec Allergy 10 Mg Tabs (Cetirizine hcl) .Marland Kitchen.. 1po once daily as  needed 6)  Cozaar 100 Mg Tabs (Losartan potassium) .Marland Kitchen.. 1 by mouth once daily 7)  Ambien 10 Mg Tabs (Zolpidem tartrate) .... 1/2 to 1 by mouth at bedtime as needed 8)  Multivitamins Tabs (Multiple vitamin) .... Take 1 tablet by mouth once a day 9)  Mucinex 600 Mg Xr12h-tab (Guaifenesin) .... Otc as directed. 10)  Astepro 0.15 % Soln (Azelastine hcl) .Marland Kitchen.. 1-2 sprays in each nostril two times a day for allergies 11)  Tylenol Extra Strength 500 Mg Tabs (Acetaminophen) .... Otc as directed. 12)  Promethazine Hcl 12.5 Mg Tabs (Promethazine hcl) .Marland Kitchen.. 1-2 by mouth up to three times a day as needed nausea- caution of sedation 13)  Nexium 40 Mg Cpdr (Esomeprazole magnesium) .Marland Kitchen.. 1 by mouth once daily 14)  Anusol-hc 2.5 % Crea (Hydrocortisone) .... Apply to affected area up to two times a day as needed hemorroids  Patient Instructions: 1)  change back to nexium instead of protonix and let me know if GI symptoms do not improve 2)  use the anusol  hc cream for hemorroids- also let me know if  no improvement  3)  increase your cozaar to 100 mg (that's 2 of the 50 mg pills) -- new px will be for 100 mg  4)  watch salt in diet 5)  labs today 6)  follow up in 1 month  7)  stay active  Prescriptions: ANUSOL-HC 2.5 % CREA (HYDROCORTISONE) apply to affected area up to two times a day as needed hemorroids  #1 medium x 1   Entered and Authorized by:   Judith Part MD   Signed by:   Judith Part MD on 03/04/2010   Method used:   [facility]        [facility] (retail)       [address]       [place], [place]  [address]       Ph: [phone] or [phone]       Fax: [phone]   RxID:   [id number] COZAAR 100 MG TABS (LOSARTAN POTASSIUM) 1 by mouth once daily  #90 x 3   Entered and Authorized by:   Judith Part MD   Signed by:   Judith Part MD on 03/04/2010   Method used:   Print then Give to Patient   RxID:   [id number] NEXIUM 40 MG CPDR (ESOMEPRAZOLE  MAGNESIUM) 1 by mouth once daily  #90 x 3   Entered and Authorized by:   Judith Part MD   Signed by:   Judith Part MD on 03/04/2010   Method used:   Print then Give to Patient   RxID:   [id number]   Current Allergies (reviewed today): ! * LISINOPRIL ! CIPRO ! KEFLEX PENICILLIN * TETANUS SULFA

## 2010-09-04 NOTE — Letter (Signed)
Summary: [facility] Instructions  [facility]  [address] [place], [place] [address]   Phone: [phone]  Fax: [phone]       Kimberly [name]    Dec 04, 1927    MRN: [id number]        Procedure Day /Date:07/01/10 TUE     Arrival Time:1 pm     Procedure Time:2 pm     Location of Procedure:                    X  [facility] (4th Floor)   PREPARATION FOR COLONOSCOPY WITH MOVIPREP   Starting 5 days prior to your procedure 06/26/10 do not eat nuts, seeds, popcorn, corn, beans, peas,  salads, or any raw vegetables.  Do not take any fiber supplements (e.g. Metamucil, Citrucel, and Benefiber).  THE DAY BEFORE YOUR PROCEDURE         DATE:06/30/10  DAY: MON  1.  Drink clear liquids the entire day-NO SOLID FOOD  2.  Do not drink anything colored red or purple.  Avoid juices with pulp.  No orange juice.  3.  Drink at least 64 oz. (8 glasses) of fluid/clear liquids during the day to prevent dehydration and help the prep work efficiently.  CLEAR LIQUIDS INCLUDE: Water Jello Ice Popsicles Tea (sugar ok, no milk/cream) Powdered fruit flavored drinks Coffee (sugar ok, no milk/cream) Gatorade Juice: apple, white grape, white cranberry  Lemonade Clear bullion, consomm, broth Carbonated beverages (any kind) Strained chicken noodle soup Hard Candy                             4.  In the morning, mix first dose of MoviPrep solution:    Empty 1 Pouch A and 1 Pouch B into the disposable container    Add lukewarm drinking water to the top line of the container. Mix to dissolve    Refrigerate (mixed solution should be used within 24 hrs)  5.  Begin drinking the prep at 5:00 p.m. The MoviPrep container is divided by 4 marks.   Every 15 minutes drink the solution down to the next mark (approximately 8 oz) until the full liter is complete.   6.  Follow completed prep with 16 oz of clear liquid of your choice (Nothing red or purple).  Continue to drink clear  liquids until bedtime.  7.  Before going to bed, mix second dose of MoviPrep solution:    Empty 1 Pouch A and 1 Pouch B into the disposable container    Add lukewarm drinking water to the top line of the container. Mix to dissolve    Refrigerate  THE DAY OF YOUR PROCEDURE      DATE: 07/01/10 DAY: TUE  Beginning at 9  a.m. (5 hours before procedure):         1. Every 15 minutes, drink the solution down to the next mark (approx 8 oz) until the full liter is complete.  2. Follow completed prep with 16 oz. of clear liquid of your choice.    3. You may drink clear liquids until 12 noon (2 HOURS BEFORE PROCEDURE).   MEDICATION INSTRUCTIONS  Unless otherwise instructed, you should take regular prescription medications with a small sip of water   as early as possible the morning of your procedure.         OTHER INSTRUCTIONS  You will need a responsible adult at least 75 years of age  to accompany you and drive you home.   This person must remain in the waiting room during your procedure.  Wear loose fitting clothing that is easily removed.  Leave jewelry and other valuables at home.  However, you may wish to bring a book to read or  an iPod/MP3 player to listen to music as you wait for your procedure to start.  Remove all body piercing jewelry and leave at home.  Total time from sign-in until discharge is approximately 2-3 hours.  You should go home directly after your procedure and rest.  You can resume normal activities the  day after your procedure.  The day of your procedure you should not:   Drive   Make legal decisions   Operate machinery   Drink alcohol   Return to work  You will receive specific instructions about eating, activities and medications before you leave.    The above instructions have been reviewed and explained to me by   _______________________    I fully understand and can verbalize these instructions _____________________________ Date  _________

## 2010-09-04 NOTE — Progress Notes (Signed)
Summary: refill requests from [facility]  Phone Note Refill Request Message from:  Fax from Pharmacy  Refills Requested: Medication #1:  LOPRESSOR 50 MG  TABS one half by mouth two times a day  Medication #2:  HYDROCHLOROTHIAZIDE 25 MG TABS 1 by mouth once  a day  Medication #3:  SYNTHROID 75 MCG  TABS one by mouth once daily  Medication #4:  NEXIUM 40 MG CPDR 1 by mouth once daily  Also cozaar.  Faxed forms from [employer] are on your shelf.  Initial call taken by: Lowella Petties CMA, AAMA,  August 18, 2010 11:24 AM  Follow-up for Phone Call        form done and in nurse in box  Follow-up by: Judith Part MD,  August 18, 2010 1:19 PM  Additional Follow-up for Phone Call Additional follow up Details #1::        [facility]  Additional Follow-up by: Melody Comas,  August 18, 2010 2:23 PM    New/Updated Medications: SYNTHROID 75 MCG  TABS (LEVOTHYROXINE SODIUM) one by mouth once daily NEXIUM 40 MG CPDR (ESOMEPRAZOLE MAGNESIUM) 1 by mouth once daily COZAAR 50 MG TABS (LOSARTAN POTASSIUM) Take 1 tablet by mouth once a day Prescriptions: SYNTHROID 75 MCG  TABS (LEVOTHYROXINE SODIUM) one by mouth once daily  #90 x 3   Entered and Authorized by:   Judith Part MD   Signed by:   Melody Comas on 08/18/2010   Method used:   Historical   RxID:   [id number] NEXIUM 40 MG CPDR (ESOMEPRAZOLE MAGNESIUM) 1 by mouth once daily  #90 x 3   Entered and Authorized by:   Judith Part MD   Signed by:   Melody Comas on 08/18/2010   Method used:   Historical   RxID:   [id number] HYDROCHLOROTHIAZIDE 25 MG TABS (HYDROCHLOROTHIAZIDE) 1 by mouth once  a day  #90 x 3   Entered and Authorized by:   Judith Part MD   Signed by:   Melody Comas on 08/18/2010   Method used:   Historical   RxID:   [id number] LOPRESSOR 50 MG  TABS (METOPROLOL TARTRATE) one half by mouth two times a day  #180 x 3   Entered and Authorized by:   Judith Part MD   Signed by:   Melody Comas on 08/18/2010   Method used:   Historical   RxID:   [id number] COZAAR 50 MG TABS (LOSARTAN POTASSIUM) Take 1 tablet by mouth once a day  #90 x 3   Entered and Authorized by:   Judith Part MD   Signed by:   Melody Comas on 08/18/2010   Method used:   Historical   RxID:   [id number]

## 2010-09-04 NOTE — Progress Notes (Signed)
Summary: [name]  Phone Note Refill Request Message from:  Fax from Pharmacy on July 10, 2010 2:57 PM  Refills Requested: Medication #1:  AMBIEN 10 MG  TABS 1/2 to 1 by mouth at bedtime as needed   Last Refilled: 06/13/2010 [facility] [address] [phone]  Initial call taken by: Melody Comas,  July 10, 2010 2:59 PM  Follow-up for Phone Call        px written on EMR for call in  Follow-up by: Judith Part MD,  July 11, 2010 8:03 AM  Additional Follow-up for Phone Call Additional follow up Details #1::        Medication phoned to pharmacy.  Additional Follow-up by: Delilah Shan CMA Duncan Dull),  July 11, 2010 9:48 AM    Prescriptions: AMBIEN 10 MG  TABS (ZOLPIDEM TARTRATE) 1/2 to 1 by mouth at bedtime as needed  #30 x 5   Entered and Authorized by:   Judith Part MD   Signed by:   Delilah Shan CMA (AAMA) on 07/11/2010   Method used:   Telephoned to ...       [facility] (retail)       [address]       [place], [place]  [address]       Ph: [phone] or [phone]       Fax: [phone]   RxID:   [id number]

## 2010-09-04 NOTE — Assessment & Plan Note (Signed)
Summary: ? UTI   Vital Signs:  Patient profile:   77 year old female Height:      63 inches Weight:      188 pounds BMI:     33.42 Temp:     97.9 degrees F oral Pulse rate:   68 / minute Pulse rhythm:   regular BP sitting:   150 / 90  (left arm) Cuff size:   regular  Vitals Entered By: Linde Gillis CMA  Dull) (June 25, 2010 10:43 AM) CC: ? UTI and sinus problems   History of Present Illness: Here ? UTI burns to urinate  is hurting above her bladder  no blood in urine  low back has been hurting  some nausea/ no vom no fever  multiple drug allergies   Current Medications (verified): 1)  Hydrochlorothiazide 25 Mg Tabs (Hydrochlorothiazide) .Marland Kitchen.. 1 By Mouth Once  A Day 2)  Lopressor 50 Mg  Tabs (Metoprolol Tartrate) .... One Half By Mouth Two Times A Day 3)  Adult Aspirin Low Strength 81 Mg  Tbdp (Aspirin) .... One By Mouth Daily 4)  Synthroid 75 Mcg  Tabs (Levothyroxine Sodium) .... One By Mouth Once Daily 5)  Zyrtec Allergy 10 Mg  Tabs (Cetirizine Hcl) .Marland Kitchen.. 1po Once Daily As Needed 6)  Ambien 10 Mg  Tabs (Zolpidem Tartrate) .... 1/2 To 1 By Mouth At Bedtime As Needed 7)  Multivitamins  Tabs (Multiple Vitamin) .... Take 1 Tablet By Mouth Once A Day 8)  Mucinex 600 Mg Xr12h-Tab (Guaifenesin) .... Otc As Directed. 9)  Astepro 0.15 % Soln (Azelastine Hcl) .Marland Kitchen.. 1-2 Sprays in Each Nostril Two Times A Day For Allergies 10)  Tylenol Extra Strength 500 Mg Tabs (Acetaminophen) .... Otc As Directed. 11)  Nexium 40 Mg Cpdr (Esomeprazole Magnesium) .Marland Kitchen.. 1 By Mouth Once Daily 12)  Anusol-Hc 2.5 % Crea (Hydrocortisone) .... Apply To Affected Area Up To Two Times A Day As Needed Hemorroids 13)  Cozaar 50 Mg Tabs (Losartan Potassium) .... Take 1 Tablet By Mouth Once A Day 14)  Macrobid 100 Mg Caps (Nitrofurantoin Monohyd Macro) .Marland Kitchen.. 1 Tab By Mouth Two Times A Day X 7 Days  Allergies: 1)  ! * Lisinopril 2)  ! Cipro 3)  ! Keflex 4)  Penicillin 5)  * Tetanus 6)  Sulfa  Past  History:  Past Medical History: Last updated: 04/12/2008 Allergic rhinitis Coronary artery disease GERD Hyperlipidemia Hypertension Hypothyroidism insomnia Asthma  GI-- Dr Christella Hartigan  Past Surgical History: Last updated: 04/12/2008 Oophorectomy Thyroid surgery 9/09 colonoscopy adenomatous colon polyp-- re check 1 y  Family History: Last updated: 03/28/2008 Father:  Mother: heart problems, HTN Siblings:  No FH of Colon Cancer:  Social History: Last updated: 01/17/2008 Marital Status: widowed Children: 2 Occupation: retired non smoker no alcohol or caffiene  Risk Factors: Smoking Status: never (03/29/2007)  Review of Systems      See HPI General:  Denies fever. GU:  Complains of dysuria and urinary frequency; denies incontinence and nocturia.  Physical Exam  General:  overweight but generally well appearing  Abdomen:  Bowel sounds positive,abdomen soft and non-tender without masses, organomegaly or hernias noted. Msk:  No CVA tenderness Psych:  normal affect, talkative and pleasant    Impression & Recommendations:  Problem # 1:  UTI (ICD-599.0) Assessment New Given multiple drug allergies, will send for culture. Treat with Macrobid. Her updated medication list for this problem includes:    Macrobid 100 Mg Caps (Nitrofurantoin monohyd macro) .Marland Kitchen... 1 tab by mouth two  times a day x 7 days  Orders: UA Dipstick w/o Micro (manual) (81002) T-Culture, Urine ([id number]) Specimen Handling (99000) Prescription Created Electronically [id number]  Complete Medication List: 1)  Hydrochlorothiazide 25 Mg Tabs (Hydrochlorothiazide) .Marland Kitchen.. 1 by mouth once  a day 2)  Lopressor 50 Mg Tabs (Metoprolol tartrate) .... One half by mouth two times a day 3)  Adult Aspirin Low Strength 81 Mg Tbdp (Aspirin) .... One by mouth daily 4)  Synthroid 75 Mcg Tabs (Levothyroxine sodium) .... One by mouth once daily 5)  Zyrtec Allergy 10 Mg Tabs (Cetirizine hcl) .Marland Kitchen.. 1po once daily as  needed 6)  Ambien 10 Mg Tabs (Zolpidem tartrate) .... 1/2 to 1 by mouth at bedtime as needed 7)  Multivitamins Tabs (Multiple vitamin) .... Take 1 tablet by mouth once a day 8)  Mucinex 600 Mg Xr12h-tab (Guaifenesin) .... Otc as directed. 9)  Astepro 0.15 % Soln (Azelastine hcl) .Marland Kitchen.. 1-2 sprays in each nostril two times a day for allergies 10)  Tylenol Extra Strength 500 Mg Tabs (Acetaminophen) .... Otc as directed. 11)  Nexium 40 Mg Cpdr (Esomeprazole magnesium) .Marland Kitchen.. 1 by mouth once daily 12)  Anusol-hc 2.5 % Crea (Hydrocortisone) .... Apply to affected area up to two times a day as needed hemorroids 13)  Cozaar 50 Mg Tabs (Losartan potassium) .... Take 1 tablet by mouth once a day 14)  Macrobid 100 Mg Caps (Nitrofurantoin monohyd macro) .Marland Kitchen.. 1 tab by mouth two times a day x 7 days Prescriptions: MACROBID 100 MG CAPS (NITROFURANTOIN MONOHYD MACRO) 1 tab by mouth two times a day x 7 days  #14 x 0   Entered and Authorized by:   Ruthe Mannan MD   Signed by:   Ruthe Mannan MD on 06/25/2010   Method used:   [facility]        [facility] [id number]       [address]       [place], [place]  [address]       Ph: [phone] or [phone]       Fax: [phone]   RxID:   [id number]    Orders Added: 1)  UA Dipstick w/o Micro (manual) [81002] 2)  T-Culture, Urine [[id number]] 3)  Specimen Handling [99000] 4)  Prescription Created Electronically [G8553] 5)  Est. Patient Level III [89381]    Current Allergies (reviewed today): ! * LISINOPRIL ! CIPRO ! KEFLEX PENICILLIN * TETANUS SULFA  Laboratory Results   Urine Tests  Date/Time Received: June 25, 2010 10:51 AM   Routine Urinalysis   Color: yellow Appearance: Clear Glucose: negative   (Normal Range: Negative) Bilirubin: negative   (Normal Range: Negative) Ketone: negative   (Normal Range: Negative) Spec. Gravity: <1.005   (Normal Range: 1.003-1.035) Blood: trace-intact   (Normal Range:  Negative) pH: 7.5   (Normal Range: 5.0-8.0) Protein: negative   (Normal Range: Negative) Urobilinogen: 0.2   (Normal Range: 0-1) Nitrite: negative   (Normal Range: Negative) Leukocyte Esterace: trace   (Normal Range: Negative)

## 2010-09-04 NOTE — Progress Notes (Signed)
Summary: Resch'd COL  Phone Note Call from Patient   Caller: Patient Call For: Dr. Christella Hartigan Summary of Call: pt. r/s her COL from 07-01-10 to 08-13-10 b/c she is sick. Would you like pt. charged the cancelation fee? Initial call taken by: Karna Christmas,  June 25, 2010 11:48 AM  Follow-up for Phone Call        no Follow-up by: Rachael Fee MD,  June 25, 2010 3:34 PM  Additional Follow-up for Phone Call Additional follow up Details #1::        Patient NOT BILLED. Additional Follow-up by: Leanor Kail [facility],  July 04, 2010 10:33 AM

## 2010-09-04 NOTE — Progress Notes (Signed)
Summary: ? reaction  Phone Note Call from Patient Call back at Home Phone [phone]   Caller: Patient Call For: Judith Part MD Summary of Call: Pt called and said she has burning in her mouth and throat, face is swollen.  She thinks she is having a reaction to one of her meds. No problems with breathing.  No appts available today, per Dr Milinda Antis, advised pt that if she is feeling that bad she should go to to urgent care.  Pt said she might wait the weekend and call back on monday if not better. Initial call taken by: Lowella Petties CMA,  Dec 06, 2009 11:44 AM  Follow-up for Phone Call        allergic reactions make me nervous because sometimes they can lead to sob-- I would be more comfortable if she went to UC today   Follow-up by: Judith Part MD,  Dec 06, 2009 12:13 PM  Additional Follow-up for Phone Call Additional follow up Details #1::        Spoke w/ patient, still not having sob, but face is still swollen and mouth is burning. Advised her that she should go tp UC today because allergic reaction can lead to SOB, she said that she woudl try to go this evening.  Additional Follow-up by: Melody Comas,  Dec 06, 2009 3:44 PM

## 2010-09-04 NOTE — Assessment & Plan Note (Signed)
GI problem list: 1. Tubular adenomas; 3 polyps 2009, one ws 15mm piecemeal resected; next colonosocpy at one year interval. 2. Constipation.   History of Present Illness Visit Type: consult  Primary GI MD: Rob Bunting MD Primary Provider: Roxy Manns, MD Requesting Provider: Roxy Manns, MD Chief Complaint: Consult colon. History of Present Illness:     very pleasant 77 year old Kimberly [name] whom I last saw about 2 years ago at the time of a colonoscopy. See those results summarized above.  her constipation has improved, she has not had to strain to move her bowels..  Has normocytic, mild anemia.   she still has difficulty with hemorrhoids intermittently, topical ointments are very effective when they flare.           Current Medications (verified): 1)  Hydrochlorothiazide 25 Mg Tabs (Hydrochlorothiazide) .Marland Kitchen.. 1 By Mouth Once  A Day 2)  Lopressor 50 Mg  Tabs (Metoprolol Tartrate) .... One Half By Mouth Two Times A Day 3)  Adult Aspirin Low Strength 81 Mg  Tbdp (Aspirin) .... One By Mouth Daily 4)  Synthroid 75 Mcg  Tabs (Levothyroxine Sodium) .... One By Mouth Once Daily 5)  Zyrtec Allergy 10 Mg  Tabs (Cetirizine Hcl) .Marland Kitchen.. 1po Once Daily As Needed 6)  Ambien 10 Mg  Tabs (Zolpidem Tartrate) .... 1/2 To 1 By Mouth At Bedtime As Needed 7)  Multivitamins  Tabs (Multiple Vitamin) .... Take 1 Tablet By Mouth Once A Day 8)  Mucinex 600 Mg Xr12h-Tab (Guaifenesin) .... Otc As Directed. 9)  Astepro 0.15 % Soln (Azelastine Hcl) .Marland Kitchen.. 1-2 Sprays in Each Nostril Two Times A Day For Allergies 10)  Tylenol Extra Strength 500 Mg Tabs (Acetaminophen) .... Otc As Directed. 11)  Nexium 40 Mg Cpdr (Esomeprazole Magnesium) .Marland Kitchen.. 1 By Mouth Once Daily 12)  Anusol-Hc 2.5 % Crea (Hydrocortisone) .... Apply To Affected Area Up To Two Times A Day As Needed Hemorroids 13)  Cozaar 50 Mg Tabs (Losartan Potassium) .... Take 1 Tablet By Mouth Once A Day  Allergies (verified): 1)  ! * Lisinopril 2)  ! Cipro 3)   ! Keflex 4)  Penicillin 5)  * Tetanus 6)  Sulfa  Vital Signs:  Patient profile:   77 year old female Height:      Kimberly inches Weight:      184 pounds BMI:     32.71 BSA:     1.87 Pulse rate:   88 / minute Pulse rhythm:   regular BP sitting:   128 / 74  (left arm) Cuff size:   regular  Vitals Entered By: Ok Anis CMA (May 26, 2010 1:55 PM)  Physical Exam  Additional Exam:  Constitutional: generally well appearing Psychiatric: alert and oriented times 3 Abdomen: soft, non-tender, non-distended, normal bowel sounds    Impression & Recommendations:  Problem # 1:  History of adenomatous colon polyps we will schedule her for repeat colonoscopy at her soonest convenience.  Patient Instructions: 1)  You will be scheduled to have a colonoscopy. 2)  The medication list was reviewed and reconciled.  All changed / newly prescribed medications were explained.  A complete medication list was provided to the patient / caregiver.  Appended Document: Orders Update/Movi    Clinical Lists Changes  Medications: Added new medication of MOVIPREP 100 GM  SOLR (PEG-KCL-NACL-NASULF-NA ASC-C) As per prep instructions. - Signed Rx of MOVIPREP 100 GM  SOLR (PEG-KCL-NACL-NASULF-NA ASC-C) As per prep instructions.;  #1 x 0;  Signed;  Entered by: Chales Abrahams CMA (AAMA);  Authorized by: Rachael Fee MD;  Method used: [facility], [place], [place]  [address], Ph: [phone] or [phone], Fax: [phone] Orders: Added new Test order of Colonoscopy (Colon) - Signed    Prescriptions: MOVIPREP 100 GM  SOLR (PEG-KCL-NACL-NASULF-NA ASC-C) As per prep instructions.  #1 x 0   Entered by:   Chales Abrahams CMA (AAMA)   Authorized by:   Rachael Fee MD   Signed by:   Chales Abrahams CMA (AAMA) on 05/26/2010   Method used:   [facility] to        [facility] (retail)       [address]       [place], [place]  [address]        Ph: [phone] or [phone]       Fax: [phone]   RxID:   [id number]

## 2010-09-04 NOTE — Assessment & Plan Note (Signed)
Summary: FOLLOW UP APPT/RI   Vital Signs:  Patient profile:   77 year old female Height:      63 inches Weight:      184.75 pounds BMI:     32.85 Temp:     97.9 degrees F oral Pulse rate:   72 / minute Pulse rhythm:   regular BP sitting:   166 / 74  (left arm) Cuff size:   large  Vitals Entered By: Lewanda Rife LPN (April 09, 2010 3:00 PM)  Serial Vital Signs/Assessments:  Time      Position  BP       Pulse  Resp  Temp     By                     132/70                         Judith Part MD  CC: follow-up visit   History of Present Illness: here for f/u of HTN and hyponatremia and lipids and anemia   had to cut hct to once daily due to low na this is better now - in nl range had to cut cozaar by 1/2 due to headache and dizziness-- does not know if that was causing her symptoms bp was up today took med today  bp at home are better with 130s/50s-60s average   anemia with Hb of 11.3 colonosc 09 polyp- 5 y f/u no stomach pain or bleeding  no stringent diet  is overdue for colonoscopy from last fall  wants to go ahead and schedule   has been anemic in younger days -- not from menses? - had to take iron    lipids trig 317 and HDL 34 and LDL 96 is not a big sweet eater -- just occasionally     Allergies: 1)  ! * Lisinopril 2)  ! Cipro 3)  ! Keflex 4)  Penicillin 5)  * Tetanus 6)  Sulfa  Past History:  Past Medical History: Last updated: 04/12/2008 Allergic rhinitis Coronary artery disease GERD Hyperlipidemia Hypertension Hypothyroidism insomnia Asthma  GI-- Dr Christella Hartigan  Past Surgical History: Last updated: 04/12/2008 Oophorectomy Thyroid surgery 9/09 colonoscopy adenomatous colon polyp-- re check 1 y  Family History: Last updated: 03/28/2008 Father:  Mother: heart problems, HTN Siblings:  No FH of Colon Cancer:  Social History: Last updated: 01/17/2008 Marital Status: widowed Children: 2 Occupation: retired non smoker no  alcohol or caffiene  Risk Factors: Smoking Status: never (03/29/2007)  Review of Systems General:  Denies fatigue, loss of appetite, and malaise. Eyes:  Denies blurring and eye irritation. ENT:  Denies postnasal drainage, ringing in ears, and sore throat. CV:  Denies chest pain or discomfort and palpitations. Resp:  Denies cough, shortness of breath, and wheezing. GI:  Denies abdominal pain and change in bowel habits. GU:  Denies dysuria and urinary frequency. MS:  Denies cramps, muscle weakness, and stiffness. Derm:  Denies itching, lesion(s), poor wound healing, and rash. Neuro:  Complains of sensation of room spinning; denies headaches, numbness, and tingling; occasionally dizzy. Psych:  mood is ok. Endo:  Denies excessive thirst and excessive urination. Heme:  Denies abnormal bruising and bleeding.  Physical Exam  General:  overweight but generally well appearing  Head:  normocephalic, atraumatic, and no abnormalities observed.  no sinus or temporal tenderness Eyes:  vision grossly intact, pupils equal, pupils round, and pupils reactive to light.  no conjunctival  pallor, injection or icterus  Ears:  R ear normal and L ear normal.   Mouth:  pharynx pink and moist.   Neck:  supple with full rom and no masses or thyromegally, no JVD or carotid bruit  Chest Wall:  No deformities, masses, or tenderness noted. Lungs:  Normal respiratory effort, chest expands symmetrically. Lungs are clear to auscultation, no crackles or wheezes. Heart:  Normal rate and regular rhythm. S1 and S2 normal without gallop, murmur, click, rub or other extra sounds. Abdomen:  Bowel sounds positive,abdomen soft and non-tender without masses, organomegaly or hernias noted. no renal bruits  Pulses:  nl perfusion Extremities:  No clubbing, cyanosis, edema, or deformity noted with normal full range of motion of all joints.   Neurologic:  sensation intact to light touch, gait normal, and DTRs symmetrical and  normal.   Skin:  Intact without suspicious lesions or rashes Cervical Nodes:  No lymphadenopathy noted Psych:  normal affect, talkative and pleasant    Impression & Recommendations:  Problem # 1:  COLONIC POLYPS (ICD-211.3) Assessment New with mild anemia - 1 year late for 1 year f/u of incompletely removed adenomatous polyps  will sched colonosc  Orders: Gastroenterology Referral (GI)  Problem # 2:  ANEMIA, MILD (ICD-285.9) Assessment: New  see above- schedule colonosc Orders: Gastroenterology Referral (GI)  Hgb: 11.3 (03/04/2010)   Hct: 33.5 (03/04/2010)   Platelets: 288.0 (03/04/2010) RBC: 3.87 (03/04/2010)   RDW: 13.9 (03/04/2010)   WBC: 6.8 (03/04/2010) MCV: 86.4 (03/04/2010)   MCHC: 33.8 (03/04/2010) TSH: 0.41 (03/04/2010)  Problem # 3:  HYPONATREMIA (ICD-276.1) Assessment: Improved resolved with less hct  doing well and bp controlled  Problem # 4:  HYPERTENSION (ICD-401.9) Assessment: Unchanged  this is fairly good on second check today - even after cutting hct and cozaar dose will continue to monitor disc healthy diet  f/u 6 mo  Her updated medication list for this problem includes:    Hydrochlorothiazide 25 Mg Tabs (Hydrochlorothiazide) .Marland Kitchen... 1 by mouth once  a day    Lopressor 50 Mg Tabs (Metoprolol tartrate) ..... One half by mouth two times a day    Cozaar 50 Mg Tabs (Losartan potassium) .Marland Kitchen... Take 1 tablet by mouth once a day  BP today: 166/74- re check 132/70 Prior BP: 170/86 (03/04/2010)  Labs Reviewed: K+: 4.3 (03/04/2010) Creat: : 0.9 (03/04/2010)   Chol: 172 (03/04/2010)   HDL: 34.20 (03/04/2010)   LDL: DEL (12/20/2007)   TG: 317.0 (03/04/2010)  Complete Medication List: 1)  Hydrochlorothiazide 25 Mg Tabs (Hydrochlorothiazide) .Marland Kitchen.. 1 by mouth once  a day 2)  Lopressor 50 Mg Tabs (Metoprolol tartrate) .... One half by mouth two times a day 3)  Adult Aspirin Low Strength 81 Mg Tbdp (Aspirin) .... One by mouth daily 4)  Synthroid 75 Mcg Tabs  (Levothyroxine sodium) .... One by mouth once daily 5)  Zyrtec Allergy 10 Mg Tabs (Cetirizine hcl) .Marland Kitchen.. 1po once daily as needed 6)  Ambien 10 Mg Tabs (Zolpidem tartrate) .... 1/2 to 1 by mouth at bedtime as needed 7)  Multivitamins Tabs (Multiple vitamin) .... Take 1 tablet by mouth once a day 8)  Mucinex 600 Mg Xr12h-tab (Guaifenesin) .... Otc as directed. 9)  Astepro 0.15 % Soln (Azelastine hcl) .Marland Kitchen.. 1-2 sprays in each nostril two times a day for allergies 10)  Tylenol Extra Strength 500 Mg Tabs (Acetaminophen) .... Otc as directed. 11)  Promethazine Hcl 12.5 Mg Tabs (Promethazine hcl) .Marland Kitchen.. 1-2 by mouth up to three times a day  as needed nausea- caution of sedation 12)  Nexium 40 Mg Cpdr (Esomeprazole magnesium) .Marland Kitchen.. 1 by mouth once daily 13)  Anusol-hc 2.5 % Crea (Hydrocortisone) .... Apply to affected area up to two times a day as needed hemorroids 14)  Cozaar 50 Mg Tabs (Losartan potassium) .... Take 1 tablet by mouth once a day  Patient Instructions: 1)  labs look better  2)  no change in medicine  3)  blood pressure is ok - keep check of it at home 4)  minimize sugar and fat intake in diet  5)  we will schedule colonoscopy at check out  6)  follow up with me in about 6 months   Current Allergies (reviewed today): ! * LISINOPRIL ! CIPRO ! KEFLEX PENICILLIN * TETANUS SULFA

## 2010-09-04 NOTE — Procedures (Signed)
Summary: Colonoscopy  Patient: Kimberly [name] Note: All result statuses are Final unless otherwise noted.  Tests: (1) Colonoscopy (COL)   COL Colonoscopy           DONE     [facility]     [address]     [place], [place]  [address]           COLONOSCOPY PROCEDURE REPORT           PATIENT:  Kimberly [name], Kimberly [name]  MR#:  [id number]     BIRTHDATE:  1928-01-10, 82 yrs. old  GENDER:  female     ENDOSCOPIST:  Rachael Fee, MD     PROCEDURE DATE:  08/13/2010     PROCEDURE:  Diagnostic Colonoscopy     ASA CLASS:  Class II     INDICATIONS:  adenomatous polyps removed 2009 (one was piecemeal)           MEDICATIONS:  Fentanyl 100 mcg IV, Versed 10 mg IV           DESCRIPTION OF PROCEDURE:   After the risks benefits and     alternatives of the procedure were thoroughly explained, informed     consent was obtained.  Digital rectal exam was performed and     revealed no rectal masses.   The LB [id number]-[id number] endoscope     was introduced through the anus and advanced to the cecum, which     was identified by both the appendix and ileocecal valve, without     limitations.  The quality of the prep was good, using MoviPrep.     The instrument was then slowly withdrawn as the colon was fully     examined.     <<PROCEDUREIMAGES>>     FINDINGS:  Mild diverticulosis was found in the sigmoid to     descending colon segments.  Internal and external hemorrhoids were     found.  This was otherwise a normal examination of the colon (see     image1, image2, and image3).   Retroflexed views in the rectum     revealed no abnormalities.    The scope was then withdrawn from     the patient and the procedure completed.     COMPLICATIONS:  None           ENDOSCOPIC IMPRESSION:     1) Mild diverticulosis in the sigmoid to descending colon     segments     2) Internal and external hemorrhoids     3) Otherwise normal examination           RECOMMENDATIONS:     1) Given your age, you will not need  another colonoscopy for     colon cancer screening or polyp surveillance. These types of tests     usually stop around the age 5.           ______________________________     Rachael Fee, MD           cc: Roxy Manns, MD           n.     Rosalie Doctor:   Rachael Fee at 08/13/2010 11:29 AM           [place], [place], [phone]  Note: An exclamation mark (!) indicates a result that was not dispersed into the flowsheet. Document Creation Date: 08/13/2010 11:30 AM _______________________________________________________________________  (1) Order result status: Final Collection or observation date-time: 08/13/2010 11:22 Requested date-time:  Receipt  date-time:  Reported date-time:  Referring Physician:   Ordering Physician: Rob Bunting [phone]) Specimen Source:  Source: Launa Grill Order Number: [id number] Lab site:

## 2010-10-22 ENCOUNTER — Encounter: Payer: Self-pay | Admitting: Family Medicine

## 2010-10-23 ENCOUNTER — Encounter: Payer: Self-pay | Admitting: Family Medicine

## 2010-10-23 ENCOUNTER — Ambulatory Visit (INDEPENDENT_AMBULATORY_CARE_PROVIDER_SITE_OTHER): Payer: Medicare Other | Admitting: Family Medicine

## 2010-10-23 VITALS — BP 186/90 | HR 76 | Temp 97.9°F | Ht 63.0 in | Wt 188.5 lb

## 2010-10-23 DIAGNOSIS — N390 Urinary tract infection, site not specified: Secondary | ICD-10-CM

## 2010-10-23 DIAGNOSIS — R300 Dysuria: Secondary | ICD-10-CM

## 2010-10-23 LAB — POCT URINALYSIS DIPSTICK
Glucose, UA: NEGATIVE
Spec Grav, UA: 1.01
Urobilinogen, UA: 0.2
pH, UA: 7

## 2010-10-23 MED ORDER — PHENAZOPYRIDINE HCL 200 MG PO TABS: 200.0000 mg | ORAL_TABLET | Freq: Three times a day (TID) | ORAL | Status: AC | PRN

## 2010-10-23 MED ORDER — NYSTATIN 100000 UNIT/GM EX CREA: TOPICAL_CREAM | Freq: Two times a day (BID) | CUTANEOUS | Status: AC

## 2010-10-23 NOTE — Patient Instructions (Signed)
RTC 1 month. Increase metoprolol to whole pill in PM, half in AM for two weeks and then incr to whole pill twice a day. Start Nystatin cream applied twice a day for two weeks. Take Pyridium for one week twice a day.

## 2010-10-23 NOTE — Progress Notes (Signed)
  Subjective:    Kimberly [name] is a 77 y.o. female who complains of burning with urination, dysuria, frequency and incomplete bladder emptying. She has had symptoms for 4 week. Patient also complains of back pain. Patient denies back pain. Patient does have a history of recurrent UTI. Patient does not have a history of pyelonephritis.     Review of Systems Pertinent items are noted in HPI.    Objective:    General appearance: alert, cooperative, appears stated age and no distress Head: Normocephalic, without obvious abnormality, atraumatic Eyes: conjunctiva clear Back: no CVAT Abdomen: soft, non-tender; bowel sounds normal; no masses,  no organomegaly and no suprapubic tenderness  Laboratory:  Urine dipstick: negative for all components.   Micro exam: negative for WBCs or RBCs.    Assessment:    dysuria     Plan:    see below.

## 2010-10-25 LAB — URINE CULTURE: Organism ID, Bacteria: NO GROWTH

## 2010-11-26 ENCOUNTER — Ambulatory Visit (INDEPENDENT_AMBULATORY_CARE_PROVIDER_SITE_OTHER): Payer: Medicare Other | Admitting: Family Medicine

## 2010-11-26 ENCOUNTER — Encounter: Payer: Self-pay | Admitting: Family Medicine

## 2010-11-26 VITALS — BP 186/84 | HR 68 | Temp 98.2°F | Ht 63.0 in | Wt 190.0 lb

## 2010-11-26 DIAGNOSIS — R1030 Lower abdominal pain, unspecified: Secondary | ICD-10-CM

## 2010-11-26 DIAGNOSIS — R300 Dysuria: Secondary | ICD-10-CM

## 2010-11-26 DIAGNOSIS — I10 Essential (primary) hypertension: Secondary | ICD-10-CM

## 2010-11-26 DIAGNOSIS — R109 Unspecified abdominal pain: Secondary | ICD-10-CM

## 2010-11-26 LAB — POCT URINALYSIS DIPSTICK
Bilirubin, UA: NEGATIVE
Glucose, UA: NEGATIVE
Ketones, UA: NEGATIVE
Nitrite, UA: NEGATIVE

## 2010-11-26 MED ORDER — LOSARTAN POTASSIUM 100 MG PO TABS: 100.0000 mg | ORAL_TABLET | Freq: Every day | ORAL | Status: DC

## 2010-11-26 NOTE — Progress Notes (Signed)
Subjective:    Patient ID: Kimberly [name], female    DOB: January 25, 1928, 77 y.o.   MRN: [id number]  HPI HTN f/u and ? uti  bp still very high  Last visit with Dr Hetty Ely - inc her metoprolol to one whole pill bid  More headaches lately  Checks it at home  Is higher than usual at home 140s/ 60s  186/84 today here - close to last time  New cuff at home   Stays nervous - does not know why   Urine symptoms are chronic for the most part - better and then worse -- for over a year  This episode started 1 month ago - and did not find infection  Has never seen urologist  Tries to drink a lot of water - not other beverages No vaginal discharge  Frequency and burning  No blood in her urine at all  No fever or nausea   Past Medical History  Diagnosis Date  . Allergy     allergic rhinitis  . CAD (coronary artery disease)   . GERD (gastroesophageal reflux disease)   . Hyperlipidemia   . Hypertension   . Hypothyroid   . Insomnia   . Asthma    Past Surgical History  Procedure Date  . Oophorectomy   . Thyroid surgery     reports that she has never smoked. She does not have any smokeless tobacco history on file. She reports that she does not drink alcohol. Her drug history not on file. family history includes Heart disease in her mother and Hypertension in her mother. Allergies  Allergen Reactions  . Cephalexin     REACTION: rash and burning sensation to skin on arms and legs with itching.  . Ciprofloxacin     REACTION: burning of mouth and skin  . Lisinopril     REACTION: cough  . Penicillins     REACTION: rash  . Sulfonamide Derivatives     REACTION: malaise  . Tetanus Toxoid     REACTION: swelling          Review of Systems Review of Systems  Constitutional: Negative for fever, appetite change,  and unexpected weight change.  Eyes: Negative for pain and visual disturbance.  Respiratory: Negative for cough and shortness of breath.   Cardiovascular: Negative.     Gastrointestinal: Negative for nausea, diarrhea and constipation.  Genitourinary: Negative for hematuria and back pain  Skin: Negative for pallor.  Neurological: Negative for weakness, light-headedness, numbness and headaches.  Hematological: Negative for adenopathy. Does not bruise/bleed easily.  Psychiatric/Behavioral: Negative for dysphoric mood. More anxious lately, however        Objective:   Physical Exam  Constitutional: She appears well-developed and well-nourished.       Mildly anxious   HENT:  Head: Normocephalic and atraumatic.  Eyes: Conjunctivae and EOM are normal. Pupils are equal, round, and reactive to light.  Neck: Normal range of motion. Neck supple. No JVD present. Carotid bruit is not present. Erythema present.  Cardiovascular: Normal rate, regular rhythm and normal heart sounds.   Pulmonary/Chest: Effort normal and breath sounds normal. She has no wheezes. She has no rales.  Abdominal: Soft. Bowel sounds are normal. She exhibits no abdominal bruit and no mass. There is tenderness in the suprapubic area. There is no guarding.  Musculoskeletal: She exhibits no edema and no tenderness.  Lymphadenopathy:    She has no cervical adenopathy.  Neurological: She is alert. She has normal reflexes. Coordination normal.  Skin: Skin is warm and dry. No rash noted. No erythema. No pallor.  Psychiatric:       Mildly anxious Good eye contact and comm skills           Assessment & Plan:

## 2010-11-26 NOTE — Assessment & Plan Note (Signed)
Ongoing with some suprapubic discomfort and frequency Many med allergies ua dip and spin relatively clear today cx urine Disc poss of urethritis-tips given for that  Will update with result  Consider urol consult if not imp

## 2010-11-26 NOTE — Assessment & Plan Note (Signed)
This is still high /systolic in 180s after inc her beta blocker Will inc cozaar to 100 daily-= with 1 mo f/u and labs  Update if worse ha or no imp at home Will bring cuff to calibrate at next visit also

## 2010-11-26 NOTE — Patient Instructions (Signed)
Drink water and avoid other beverages We will update you with urine culture report as soon as we get it  Avoid baths  Avoid soap in the urethra area - that can irritate Increase your cozaar (losartan) from 50 to 100 mg once daily (the pills you have left take 2 at a time)  Follow up with me in about a month  Keep checking bp at home - alert me if high and bring cuff to next visit so we can make sure it is accurate

## 2010-11-27 DIAGNOSIS — R1030 Lower abdominal pain, unspecified: Secondary | ICD-10-CM | POA: Insufficient documentation

## 2010-11-27 NOTE — Assessment & Plan Note (Signed)
This seems related to urine symptoms - tender over bladder Pend urine cx Dip seemed neg

## 2010-11-29 LAB — URINE CULTURE: Colony Count: 40000

## 2010-11-30 ENCOUNTER — Telehealth: Payer: Self-pay | Admitting: Family Medicine

## 2010-11-30 DIAGNOSIS — N390 Urinary tract infection, site not specified: Secondary | ICD-10-CM

## 2010-11-30 MED ORDER — NITROFURANTOIN MONOHYD MACRO 100 MG PO CAPS: 100.0000 mg | ORAL_CAPSULE | Freq: Two times a day (BID) | ORAL | Status: AC

## 2010-11-30 NOTE — Telephone Encounter (Signed)
Please let pt know urine cx is pos for enterococcus bacteria  I want to tx her with macrobid= please make sure she is not allergic to that Drink lots of water Update me in 1-2 wk if symptoms are not gone Px written for call in

## 2010-12-01 NOTE — Telephone Encounter (Signed)
Left message for pt to call back  °

## 2010-12-01 NOTE — Telephone Encounter (Signed)
Let me know if you still cannot reach her tomorrow-thanks

## 2010-12-01 NOTE — Telephone Encounter (Signed)
Patient notified as instructed by telephone. Pt states she is not allergic to Macrobid. Medication phoned to  [facility] pharmacy as instructed.

## 2010-12-19 NOTE — H&P (Signed)
NAMEMARSENA, Kimberly [name]                   ACCOUNT NO.:  [id number]   MEDICAL RECORD NO.:  [id number]          PATIENT TYPE:  INP   LOCATION:  3708                         FACILITY:  [facility]   PHYSICIAN:  Thomas C. Wall, M.D.   DATE OF BIRTH:  31-Oct-1927   DATE OF ADMISSION:  08/20/2004  DATE OF DISCHARGE:                                HISTORY & PHYSICAL   CHIEF COMPLAINT:  Heart pounding and increase shortness of breath.   HISTORY OF PRESENT ILLNESS:  Kimberly [name] is a 77 year old widowed white female  from [place], [place] who awoke last evening at 3 a.m. with her heart  pounding.  It went in from her midchest to her back.  She had a little  nausea.  She took aspirin and Ativan and was relieved.  She has noted  recurrent symptoms this morning and went to see Dr. Aida Puffer.  She had a  new left bundle branch block pattern.  She has a history of nonobstructive  disease.  She was transferred for further evaluation.   She was catheterized for a question of septal ischemia in August 02, 2000.  Catheterization at that time demonstrating a 60% proximal LAD at the  bifurcation of a large first diagonal.  She had a 20% lesion in the obtuse  marginal-2 and a 30% proximal RCA.  She had normal left ventricular  function.  She did not have a left bundle branch block pattern at that time.   Her cardiac risk factors include age, sex, known disease, and mixed  hyperlipidemia.  She denies the use of decongestants.  She has had some  symptoms of sinus infection but has been taking some Clarinex.   ALLERGIES:  She has been intolerant of NIACIN and STATINS for what she tells  me and PENICILLIN.   PAST MEDICAL HISTORY:  1.  History of gastroesophageal reflux.  2.  History of asthma.  3.  Treated hypothyroidism.  4.  She also has a history of hypertension.   SOCIAL HISTORY:  She lives in [place], [place] alone.  She has two  children. Family is here tonight.  She has never smoke and never  drank any  alcohol.   FAMILY HISTORY:  Mother died of a stroke at age 11.  One brother has died of  a heart attack in his 73s.   REVIEW OF SYMPTOMS:  Other than the HPI is unremarkable.   MEDICATIONS:  1.  She is currently on Synthroid 88 mcg q.d.  2.  HCTZ.  3.  Triamterene.  4.  Aspirin.  5.  Nexium.  6.  Fish oil.   PHYSICAL EXAMINATION:  GENERAL:  Very pleasant.  She is in no acute  distress.  VITAL SIGNS:  Blood pressure 160/56, pulse 74 and regular, her respiratory  rate is 18, temperature 98.6.  HEENT:  PERRLA.  Extraocular movements intact.  Sclerae are clear.  NECK:  Carotid upstrokes are equal bilaterally without bruits.  There is no  JVD.  Thyroid is not enlarged.  LUNGS:  Clear.  HEART:  Regular rate and  rhythm with a split S2.  ABDOMEN:  Soft.  Good bowel sounds.  No hepatosplenomegaly.  EXTREMITIES:  No clubbing, cyanosis, or edema.  PULSES:  Present.   LABORATORY DATA:  Remarkable for hemoglobin of 11.9.  Her creatinine is 1.0,  potassium 4.0.   EKG shows sinus rhythm in a left bundle branch block pattern.  Chest x-ray  is pending.   IMPRESSION:  1.  Chest discomfort:  Palpitations and new left bundle branch block pattern      in a patient with known disease, particularly of proximal left anterior      descending artery.  Rule out progressive obstructive coronary artery      disease.  2.  New left bundle branch block pattern.  3.  History of asthma.  4.  History of gastroesophageal reflux disease.  5.  Hypothyroidism.  6.  Mixed hyperlipidemia.  7.  Abnormal Cardiolite prior to catheterization in 2001.   PLAN:  1.  Cardiac catheterization:  I have discussed the indication, risks, and      potential benefits with her and her family.  They agree to proceed.  2.  Tri-Chlor at the time of discharge to treat her mixed hyperlipidemia.      She has not tried this drug.  3.  Check TSH.      TCW/MEDQ  D:  08/20/2004  T:  08/20/2004  Job:  [id number]   cc:    Aida Puffer, M.D.  [facility]  [address] [place], [place] [address]   E. Graceann Congress, M.D.

## 2010-12-19 NOTE — Cardiovascular Report (Signed)
[place]. [facility]  Patient:    Kimberly [name], Kimberly [name]                          MRN: [id number] Proc. Date: 08/02/00 Adm. Date:  [id number] Attending:  Alric Quan CC:         Cecil Cranker, M.D. [facility]  Feliciana Rossetti, M.D.  Cardiac Cath Lab   Cardiac Catheterization  PROCEDURE:  Left heart catheterization with coronary angiography and left ventriculography.  INDICATIONS FOR PROCEDURE:  [name] is a 77 year old woman who has been experiencing exertional dyspnea and atypical chest discomfort. A stress cardiolite scan showed a question of subtle ______. She was referred for cardiac catheterization.  DESCRIPTION OF PROCEDURE:  A 6 French sheath was placed in the right femoral artery. Standard Judkins 6 French catheters were utilized. Contrast was Omnipaque. There were no complications.  RESULTS:  HEMODYNAMICS:  Left ventricular pressure 140/7, aortic pressure 140/65. There was no aortic valve gradient.  LEFT VENTRICULOGRAM:  Wall motion is normal. Ejection fraction calculated at 63%. No mitral regurgitation.  CORONARY ANGIOGRAPHY (right dominant):  The left main has a 20% stenosis.  LEFT ANTERIOR DESCENDING ARTERY:  Has a 30% stenosis at its origin and a 50-60% stenosis in the proximal vessel just at the bifurcation of a large first diagonal branch. The LAD gives rise to a large first diagonal and a small second diagonal.  LEFT CIRCUMFLEX:  Gives rise to a small OM1, large OM2, and a large OM3. OM2 has a 20% stenosis at its origin.  RIGHT CORONARY ARTERY:  Has a 30% stenosis proximally which maybe pushing the catheter into a spasm. The distal right coronary gives rise to a normal size posterior descending artery and 2 small posterolateral branches.  IMPRESSION: 1. Normal left ventricular systolic function. 2. Moderate but nonobstructive coronary artery disease. DD:  08/02/00 TD:  08/02/00 Job: [id number]

## 2010-12-19 NOTE — Discharge Summary (Signed)
Kimberly [name], Kimberly [name]                   ACCOUNT NO.:  [id number]   MEDICAL RECORD NO.:  [id number]          PATIENT TYPE:  INP   LOCATION:  3708                         FACILITY:  [facility]   PHYSICIAN:  Thomas C. Wall, M.D.   DATE OF BIRTH:  [birth year]   DATE OF ADMISSION:  08/20/2004  DATE OF DISCHARGE:  08/21/2004                                 DISCHARGE SUMMARY   PROCEDURES:  1.  Cardiac catheterization.  2.  Coronary arteriogram.  3.  Left ventriculogram.   HOSPITAL COURSE:  Kimberly [name] is a 77 year old female who went to her family  physician, Dr. Fayrene Fearing Little's office in [place], [place], for  evaluation of chest pain and tachycardia.  She had had recurrent symptoms  and when she went to see Dr. Clarene Duke, she felt some better but he felt  admission was needed for further evaluation and treatment and she was  admitted by cardiology.   Her cardiac enzymes were negative for MI.  She has a history of  nonobstructive coronary artery disease by catheterization and it was felt  that catheterization was indicated to reevaluate her anatomy.  This was  performed on August 21, 2004.   The cardiac catheterization showed no significant coronary artery disease.  She was evaluated by Dr. Juanda Chance who felt that her symptoms were possibly  secondary to arrhythmia.  If she does well post catheterization and has no  recurrent symptoms, she is tentatively considered stable for discharge and  is to get an outpatient event monitor and follow up with E. Graceann Congress,  M.D.   Ms. Arciniega has a history of hyperlipidemia but in the past has been intolerant  to statins and Niacin.  A lipid profile is pending at the time of dictation.  Tricor can be considered as medical therapy but will leave this to a follow-  up visit with Dr. Glennon Hamilton.   DISCHARGE DIAGNOSES:  1.  Chest pain, no significant coronary artery disease by catheterization      this admission with preserved left ventricular function.  2.   Gastroesophageal reflux disease symptoms.  3.  Treated hypothyroidism.  4.  History of asthma.  5.  Hyperlipidemia.  6.  Palpitations.  7.  Family history of coronary artery disease.  8.  Allergy or intolerance to penicillin, statins and Niacin.   DISCHARGE INSTRUCTIONS:  1.  She is to do no driving or strenuous activity for two days.  2.  She is to call the office for problems with the catheterization site.  3.  She is to stick to a low fat diet.  4.  She is to follow up with Dr. Clarene Duke as needed and has an appointment      with Dr. Corinda Gubler on September 30, 2004, at 11:15.  Event monitor will be      done between now and that office visit and the office will call her.   DISCHARGE MEDICATIONS:  1.  Metoprolol 25 mg b.i.d.  2.  Nexium 40 mg daily.  3.  Synthroid 88 mcg daily (TSH this admission 0.277).  4.  Hydrochlorothiazide/triamterene daily.  5.  Aspirin daily.  6.  Fish oil daily.      RB/MEDQ  D:  08/21/2004  T:  08/21/2004  Job:  [id number]   cc:   Aida Puffer  [address]  [place]  [place] [address]  Fax: [phone]   E. Graceann Congress, M.D.

## 2010-12-19 NOTE — Discharge Summary (Signed)
[place]. [facility]  Patient:    Kimberly [name], Kimberly [name]                          MRN: [id number] Adm. Date:  [id number] Disc. Date: 08/02/00 Attending:  Alric Quan Dictator:   Tereso Newcomer, P.A.                           Discharge Summary  DATE OF BIRTH:  04-Aug-1927  DISCHARGE DIAGNOSES: 1. Nonobstructive coronary artery disease. 2. Normal left ventricular ejection fraction. 3. Hyperlipidemia. 4. History of thyroid nodule with thyroidectomy. 5. Hypothyroidism.  PROCEDURES PERFORMED THIS ADMISSION: 1. Cardiac catheterization on August 02, 2000, revealing left main 20%, left    anterior descending ostial 30%, proximal 50 to 60% at diagonal bifurcation,    left circumflex 20% obtuse marginal 2 ostium, RCA proximal 30%.  Left    ventricular ejection fraction calculated at 63%, normal wall motion. 2. Gated exercise treadmill Cardiolite on July 31, 2000, revealing mild    suggestion of inferoseptal hypokinesis, subtle suggestion of potential    septal ischemia.  HISTORY OF PRESENT ILLNESS:  This 77 year old female was seen in the office on July 22, 2000, for complaints of chest pain upon referral of Dr. Quintella Reichert.  ALLERGIES:  PENICILLIN.  PHYSICAL EXAMINATION:  GENERAL:  Initial physical exam revealed a well-nourished, well-developed female in no acute distress.  VITAL SIGNS:  Blood pressure 110/72, pulse 100 and regular, weight 189 pounds.  NECK:  Without adenopathy or JVD, without bruits.  LUNGS:  Clear to auscultation.  HEART:  Regular rate and rhythm.  ABDOMEN:  Positive bowel sounds.  Slight epigastric tenderness.  EXTREMITIES:  Trace pedal edema bilaterally.  HOSPITAL COURSE:  Patient was admitted for chest pain.  She ruled out for myocardial infarction by enzymes.  She underwent a gated exercise treadmill Cardiolite on July 31, 2000 with the results noted above.  Given her risk factors and age, it was felt she should  undergo cardiac catheterization to define her coronary anatomy.  She underwent the procedure by Dr. Daisey Must on August 02, 2000.  The results are noted above.  Given the fact she had nonobstructive coronary artery disease and a normal LV function, it was felt that she could follow up with her primary care physician for further workup.  LABORATORY DATA:  White count 10,900, hemoglobin 13.6, hematocrit 4.1, platelet count 342,000.  INR 1.0.  Sodium 137, potassium 4.0, chloride 98, CO2 32, BUN 22, creatinine 1.1, glucose 120.  Total bilirubin 1.1, alkaline phosphatase 87, AST 39 - upper limit of normal range is at 37, ALT 23, total protein 7.3, albumin 3.6, calcium 8.7, amylase 27, lipase 180.  Cardiac enzymes:  Initial CK 43, CK #2 44, #3 34.  MB #1 0.9, #2 1.5, #3 0.8. Troponin I #1 0.01, #2 0.02.  Lipid profile:  Total cholesterol 340, triglycerides 344, HDL 56, LDL 215, TSH 0.559.  DISCHARGE MEDICATIONS: 1. Xanax 0.5 mg p.r.n. 2. Synthroid 88 mcg p.r.n. 3. Halcion 0.25 mg p.r.n. 4. Zocor 20 mg q.h.s. 5. Coated aspirin 325 mg q.d. 6. Prevacid 30 mg q.d.  Prescription given for Protonix 40 mg q.d., #30, two refills, for when the patient runs out of her samples of Prevacid.  ACTIVITY:  No driving, heavy lifting, exertion, or sex for three days.  DIET:  She is to eat a low-fat, low-sodium diet.  She  is to call us with concerns over groin bleeding, or swelling, or drainage.  FOLLOW-UP:  She should see Dr. Quintella Reichert in the next one to two weeks for further workup.  She should call him for an appointment.  She will need her cholesterol and liver enzymes checked in six weeks.  SPECIAL NOTE:  Patient had been on Lipitor in the past and noticed some abdominal discomfort with that.  That was the reason for choosing Zocor. DD:  08/02/00 TD:  08/02/00 Job: [id number]

## 2010-12-19 NOTE — Cardiovascular Report (Signed)
Kimberly [name], Kimberly [name]                   ACCOUNT NO.:  [id number]   MEDICAL RECORD NO.:  [id number]          PATIENT TYPE:  INP   LOCATION:  3708                         FACILITY:  [facility]   PHYSICIAN:  Charlies Constable, M.D. [facility] DATE OF BIRTH:  1928/03/20   DATE OF PROCEDURE:  08/21/2004  DATE OF DISCHARGE:                              CARDIAC CATHETERIZATION   CLINICAL HISTORY:  [name] is 77 years old and has a history of  nonobstructive coronary disease at catheterization in 2001.  She recently  developed symptoms of chest discomfort which she described as a pounding  sensation in her chest and back.  This lasted for 30 minutes and then  recurred.  She was admitted and scheduled for evaluation angiography.   PROCEDURE:  The procedure was performed via the right femoral artery, using  an arterial sheath and 4-French preformed coronary catheters.  A femoral  arterial puncture was performed and Omnipaque contrast was used.  A distal  aortogram was performed to rule out any vascular causes for hypertension.  The right femoral artery was closed with AngioSeal at the end of the  procedure.  The patient tolerated the procedure well and left the laboratory  in satisfactory condition.   RESULTS:  The aortic pressure was 161/58 with a mean of 95 and left  ventricular pressure was 161/8.   Left main coronary artery:  The left main coronary artery is free of  significant disease.   Left anterior descending artery:  The left anterior descending artery gave  rise to a diagonal branch and three septal perforators.  These vessels were  free of significant disease.   Circumflex:  The circumflex gave rise to two marginal branches, an atrial  branch and two posterolateral branches.  These vessels were free of  significant disease.   Right coronary artery:  The right coronary artery is a moderate sized vessel  that gave rise to a conus branch, a right ventricular branch, a posterior  descending branch,  and a small posterolateral branch.  This vessel was free  of significant disease.   Left ventriculogram:  The left ventriculogram was performed in the RAO  projection and showed good wall motion with no areas of hypokinesis.  The  estimated ejection fraction was 60%.   Distal aortogram:  A distal aortogram was performed that showed patent renal  arteries and no significant aortoiliac obstruction.   CONCLUSION:  Normal coronary angiography and left ventricular wall motion.   RECOMMENDATIONS:  There is no source of ischemia.  In view of these findings  I suspect that her chest discomfort may have been related to an arrhythmia  since she described rapid palpations.  We will plan treatment with a beta-  blocker and let her go home today and have an outpatient event monitor and  then follow up with Dr. Corinda Gubler and Dr. Clarene Duke.  I spoke with both Dr.  Corinda Gubler and Dr. Clarene Duke today.       BB/MEDQ  D:  08/21/2004  T:  08/21/2004  Job:  [id number]   cc:   Aida Puffer  [address]  [place]  [place] [address]  Fax: [phone]   E. Graceann Congress, M.D.

## 2010-12-30 ENCOUNTER — Ambulatory Visit (INDEPENDENT_AMBULATORY_CARE_PROVIDER_SITE_OTHER): Payer: Medicare Other | Admitting: Family Medicine

## 2010-12-30 ENCOUNTER — Encounter: Payer: Self-pay | Admitting: Family Medicine

## 2010-12-30 VITALS — BP 188/86 | HR 60 | Temp 97.8°F | Ht 63.0 in | Wt 187.2 lb

## 2010-12-30 DIAGNOSIS — I10 Essential (primary) hypertension: Secondary | ICD-10-CM

## 2010-12-30 DIAGNOSIS — N390 Urinary tract infection, site not specified: Secondary | ICD-10-CM

## 2010-12-30 MED ORDER — NITROFURANTOIN MONOHYD MACRO 100 MG PO CAPS: 100.0000 mg | ORAL_CAPSULE | Freq: Two times a day (BID) | ORAL | Status: AC

## 2010-12-30 MED ORDER — AMLODIPINE BESYLATE 5 MG PO TABS: 5.0000 mg | ORAL_TABLET | Freq: Every day | ORAL | Status: DC

## 2010-12-30 NOTE — Progress Notes (Signed)
Subjective:    Patient ID: Kimberly [name], female    DOB: 1927/10/23, 77 y.o.   MRN: [id number]  HPI Here for f/u of uti Had enterococcus and was tx with macrobid - was sensitive to that  Now still having some pressure- surprised not completely better  Usually after she gets an infection - it comes right back  Got better for 3-4 days after the antibiotic and then symptoms returned Symptoms are more mild but still present Pressure all the time - frequency with small amounts  No blood in urine No back pain or fever Generally worn out    Last visit inc her cozaar to 100 mg for high bp  Her bp at home are very labile -- at home  Her cuff calibrates well with ours  bp today is 188/86 on first check  Just not improved enough  No side eff to inc cozaar   Past Medical History  Diagnosis Date  . Allergy     allergic rhinitis  . CAD (coronary artery disease)   . GERD (gastroesophageal reflux disease)   . Hyperlipidemia   . Hypertension   . Hypothyroid   . Insomnia   . Asthma     Past Surgical History  Procedure Date  . Oophorectomy   . Thyroid surgery     History   Social History  . Marital Status: Widowed    Spouse Name: N/A    Number of Children: N/A  . Years of Education: N/A   Occupational History  . Not on file.   Social History Main Topics  . Smoking status: Never Smoker   . Smokeless tobacco: Not on file  . Alcohol Use: No  . Drug Use:   . Sexually Active:    Other Topics Concern  . Not on file   Social History Narrative  . No narrative on file    Family History  Problem Relation Age of Onset  . Heart disease Mother   . Hypertension Mother     Allergies  Allergen Reactions  . Cephalexin     REACTION: rash and burning sensation to skin on arms and legs with itching.  . Ciprofloxacin     REACTION: burning of mouth and skin  . Lisinopril     REACTION: cough  . Penicillins     REACTION: rash  . Sulfonamide Derivatives     REACTION: malaise  .  Tetanus Toxoid     REACTION: swelling        Review of Systems Review of Systems  Constitutional: Negative for fever, appetite change, fatigue and unexpected weight change.  Eyes: Negative for pain and visual disturbance.  Respiratory: Negative for cough and shortness of breath.   Cardiovascular: Negative for cp or sob or palpitations .   Gastrointestinal: Negative for nausea, diarrhea and constipation.  Genitourinary: pos for suprapubic fullness/pain and frequency/ neg for dysuria or odor Skin: Negative for pallor.or rash   Neurological: Negative for weakness, light-headedness, numbness and headaches.  Hematological: Negative for adenopathy. Does not bruise/bleed easily.  Psychiatric/Behavioral: Negative for dysphoric mood. The patient is not nervous/anxious.          Objective:   Physical Exam  Constitutional: She appears well-developed and well-nourished. No distress.       overwt and well appearing   HENT:  Head: Normocephalic and atraumatic.  Eyes: Conjunctivae and EOM are normal. Pupils are equal, round, and reactive to light.  Neck: Normal range of motion. Neck supple. No JVD present.  Carotid bruit is not present. No thyromegaly present.  Cardiovascular: Normal rate, regular rhythm, normal heart sounds and intact distal pulses.   Pulmonary/Chest: Effort normal and breath sounds normal. No respiratory distress. She has no wheezes. She exhibits no tenderness.  Abdominal: Soft. Bowel sounds are normal. She exhibits no abdominal bruit and no mass. There is tenderness.       Mild suprapubic tenderness without rebound or gaurding  Musculoskeletal: She exhibits no edema and no tenderness.       No cva tenderness  Lymphadenopathy:    She has no cervical adenopathy.  Neurological: She is alert. She has normal reflexes. She exhibits normal muscle tone. Coordination normal.  Skin: Skin is warm and dry. No rash noted. No erythema. No pallor.  Psychiatric: She has a normal mood and  affect.          Assessment & Plan:

## 2010-12-30 NOTE — Patient Instructions (Signed)
Add amlodipine 5 mg daily in am for blood pressure Follow up 6 weeks  Let me know if any side effects or problems  Take another course of antibiotic (macrobid) Get [place] urine when you can  We will do urology referral at check out

## 2010-12-31 LAB — POCT URINALYSIS DIPSTICK
Blood, UA: NEGATIVE
Glucose, UA: NEGATIVE
Nitrite, UA: NEGATIVE
Urobilinogen, UA: 0.2
pH, UA: 6

## 2010-12-31 NOTE — Progress Notes (Signed)
Pt notified u/a looked pretty good but will send for culture. Pt uses [facility] if pharmacy needed. Pt can be reached at [phone].

## 2011-01-01 NOTE — Assessment & Plan Note (Signed)
Ref to urol for further eval 

## 2011-01-01 NOTE — Assessment & Plan Note (Signed)
bp not much imp with inc in cozaar dose Will add amlodipine to further imp systolic Disc poss side eff incl edema  Start 5 mg daily F/u planned  Disc low sodium diet Will continue to also monitor bp at home

## 2011-01-01 NOTE — Assessment & Plan Note (Signed)
Improved ua but still symptomatic  Send specimen for culture Also ref to urol for frequent utis Update if not imp or if worse or fever/other symptoms Urged to drink water and avoid other beverages

## 2011-01-03 ENCOUNTER — Telehealth: Payer: Self-pay

## 2011-01-03 NOTE — Telephone Encounter (Signed)
Patient notified as instructed by telephone. Pt said she had not finished the 1st prescription of Macrobid, but she would stop it. Pt has appt to see urologist on Mon. 01/05/11.

## 2011-01-03 NOTE — Telephone Encounter (Signed)
Message copied by Patience Musca on Sat Jan 03, 2011 10:01 AM ------      Message from: Roxy Manns A      Created: Fri Jan 02, 2011  2:56 PM       Urine cx shows that the bacteria in urine is resistant to macrobid - so don't take it       Other abx it is sensitive to are on her list for allergies / intolerances      Keep drinking water - let me know if worse symptoms and f/u with urologist as planned

## 2011-01-09 ENCOUNTER — Other Ambulatory Visit: Payer: Self-pay | Admitting: *Deleted

## 2011-01-09 MED ORDER — ZOLPIDEM TARTRATE 10 MG PO TABS: ORAL_TABLET | ORAL | Status: DC

## 2011-01-09 NOTE — Telephone Encounter (Signed)
Px written for call in   

## 2011-01-09 NOTE — Telephone Encounter (Signed)
Medication phoned to [facility] pharmacy as instructed.

## 2011-01-20 ENCOUNTER — Other Ambulatory Visit: Payer: Self-pay | Admitting: *Deleted

## 2011-01-20 MED ORDER — METOPROLOL TARTRATE 50 MG PO TABS: 50.0000 mg | ORAL_TABLET | Freq: Two times a day (BID) | ORAL | Status: DC

## 2011-02-09 ENCOUNTER — Other Ambulatory Visit: Payer: Self-pay | Admitting: *Deleted

## 2011-02-09 MED ORDER — ZOLPIDEM TARTRATE 10 MG PO TABS: ORAL_TABLET | ORAL | Status: DC

## 2011-02-09 NOTE — Telephone Encounter (Signed)
Medication phoned to [facility] pharmacy as instructed.  

## 2011-02-09 NOTE — Telephone Encounter (Signed)
Received faxed refill request from pharmacy.

## 2011-02-09 NOTE — Telephone Encounter (Signed)
Px written for call in   

## 2011-02-10 ENCOUNTER — Ambulatory Visit (INDEPENDENT_AMBULATORY_CARE_PROVIDER_SITE_OTHER): Payer: Medicare Other | Admitting: Family Medicine

## 2011-02-10 ENCOUNTER — Encounter: Payer: Self-pay | Admitting: Family Medicine

## 2011-02-10 DIAGNOSIS — N390 Urinary tract infection, site not specified: Secondary | ICD-10-CM

## 2011-02-10 DIAGNOSIS — E871 Hypo-osmolality and hyponatremia: Secondary | ICD-10-CM

## 2011-02-10 DIAGNOSIS — I10 Essential (primary) hypertension: Secondary | ICD-10-CM

## 2011-02-10 DIAGNOSIS — E039 Hypothyroidism, unspecified: Secondary | ICD-10-CM

## 2011-02-10 DIAGNOSIS — E785 Hyperlipidemia, unspecified: Secondary | ICD-10-CM

## 2011-02-10 DIAGNOSIS — R7309 Other abnormal glucose: Secondary | ICD-10-CM

## 2011-02-10 LAB — CBC WITH DIFFERENTIAL/PLATELET
Basophils Absolute: 0 10*3/uL (ref 0.0–0.1)
Basophils Relative: 0.3 % (ref 0.0–3.0)
Eosinophils Absolute: 0.1 10*3/uL (ref 0.0–0.7)
HCT: 33.4 % — ABNORMAL LOW (ref 36.0–46.0)
Hemoglobin: 11.4 g/dL — ABNORMAL LOW (ref 12.0–15.0)
Lymphs Abs: 1.9 10*3/uL (ref 0.7–4.0)
MCHC: 34.2 g/dL (ref 30.0–36.0)
MCV: 85.7 fl (ref 78.0–100.0)
Neutro Abs: 3.2 10*3/uL (ref 1.4–7.7)
RBC: 3.9 Mil/uL (ref 3.87–5.11)
RDW: 13.7 % (ref 11.5–14.6)

## 2011-02-10 LAB — COMPREHENSIVE METABOLIC PANEL
ALT: 21 U/L (ref 0–35)
AST: 26 U/L (ref 0–37)
BUN: 14 mg/dL (ref 6–23)
Calcium: 8.8 mg/dL (ref 8.4–10.5)
Creatinine, Ser: 0.9 mg/dL (ref 0.4–1.2)
GFR: 67.28 mL/min (ref >60.00)
Total Bilirubin: 0.3 mg/dL (ref 0.3–1.2)

## 2011-02-10 LAB — LDL CHOLESTEROL, DIRECT: Direct LDL: 108 mg/dL

## 2011-02-10 LAB — LIPID PANEL
HDL: 35.6 mg/dL — ABNORMAL LOW (ref >39.00)
Triglycerides: 274 mg/dL — ABNORMAL HIGH (ref 0.0–149.0)

## 2011-02-10 NOTE — Assessment & Plan Note (Signed)
Seeing on urol and on proph macrobid now  This may be making her a bit dizzy ? - she will check in with urologist if symptoms worsen

## 2011-02-10 NOTE — Assessment & Plan Note (Signed)
With some white coat component- excellent at home Pt will continue to monitor(and her cuff has been tested)  Also nervous today about thunder  Lab today F/u 6 mo

## 2011-02-10 NOTE — Progress Notes (Signed)
Subjective:    Patient ID: Kimberly [name], female    DOB: 03/24/1928, 77 y.o.   MRN: [id number]  HPI  Here for f/u of HTN  Last visit bp was 188/86 with cozaar and hctz and lopressor  Added norvasc 5 Now is 170/76- still high on first check  Symptoms - none , no cp or headache or edema or palpitations   Has been checking it at home - and bp got down to 110 systolic  She got nervous and changed the norvasc to every other day  Cuff is accurate- we tested it    Lopressor cannot be advanced due to pulse of 60   No labs since last aug Low na in past - no symptoms    Is feeling tired - needs thyroid check No skin or hair changes  No sleep problems or tremor   Due for chol profile  Has been watching diet   Due for sugar check Has been careful about diet No thirst or inc urination  No meds for glucose  Check a1c today  Patient Active Problem List  Diagnoses  . COLONIC POLYPS  . GOITER  . HYPOTHYROIDISM  . HYPERLIPIDEMIA  . HYPONATREMIA  . ANEMIA, MILD  . ANXIETY  . DISORDERS, ORGANIC INSOMNIA NOS  . HYPERTENSION  . CORONARY ARTERY DISEASE  . BUNDLE BRANCH BLOCK  . HEMORRHOIDS  . ALLERGIC RHINITIS  . GERD  . IRRITABLE BOWEL SYNDROME  . HYPERGLYCEMIA  . PERSONAL HX COLONIC POLYPS  . Dysuria  . Lower abdominal pain  . UTI (lower urinary tract infection)  . Frequent UTI   Past Medical History  Diagnosis Date  . Allergy     allergic rhinitis  . CAD (coronary artery disease)   . GERD (gastroesophageal reflux disease)   . Hyperlipidemia   . Hypertension   . Hypothyroid   . Insomnia   . Asthma    Past Surgical History  Procedure Date  . Oophorectomy   . Thyroid surgery    History  Substance Use Topics  . Smoking status: Never Smoker   . Smokeless tobacco: Not on file  . Alcohol Use: No   Family History  Problem Relation Age of Onset  . Heart disease Mother   . Hypertension Mother    Allergies  Allergen Reactions  . Cephalexin     REACTION: rash  and burning sensation to skin on arms and legs with itching.  . Ciprofloxacin     REACTION: burning of mouth and skin  . Lisinopril     REACTION: cough  . Penicillins     REACTION: rash  . Sulfonamide Derivatives     REACTION: malaise  . Tetanus Toxoid     REACTION: swelling   Current Outpatient Prescriptions on File Prior to Visit  Medication Sig Dispense Refill  . aspirin 81 MG tablet Take 81 mg by mouth daily.        . cetirizine (ZYRTEC) 10 MG tablet 1 tablet by mouth daily as needed.       Marland Kitchen esomeprazole (NEXIUM) 40 MG capsule Take 40 mg by mouth daily.        Marland Kitchen guaiFENesin (MUCINEX) 600 MG 12 hr tablet OTC as directed.       . hydrochlorothiazide 25 MG tablet Take 25 mg by mouth daily.        Marland Kitchen levothyroxine (SYNTHROID, LEVOTHROID) 75 MCG tablet Take 75 mcg by mouth daily.        Marland Kitchen losartan (COZAAR) 100  MG tablet Take 1 tablet (100 mg total) by mouth daily.  90 tablet  3  . metoprolol (LOPRESSOR) 50 MG tablet Take 1 tablet (50 mg total) by mouth 2 (two) times daily. 1 tablet by mouth two times a day.  180 tablet  3  . Multiple Vitamin (MULTIVITAMIN) capsule Take 1 capsule by mouth daily.        Marland Kitchen zolpidem (AMBIEN) 10 MG tablet 1/2 to 1 tablet by mouth at bedtime as needed.  30 tablet  3  . acetaminophen (TYLENOL) 500 MG tablet OTC As directed.       . Azelastine HCl (ASTEPRO) 0.15 % SOLN 1-2 sprays in each nostril two times a day for allergies as needed.      . hydrocortisone (ANUSOL-HC) 2.5 % rectal cream Apply to affected area up to two times a day as needed for hemorrhoids.       Marland Kitchen nystatin (MYCOSTATIN) cream Apply topically 2 (two) times daily.  30 g  0  . phenazopyridine (PYRIDIUM) 200 MG tablet Take 1 tablet (200 mg total) by mouth 3 (three) times daily as needed for pain.  20 tablet  0     Review of Systems Review of Systems  Constitutional: Negative for fever, appetite change, and unexpected weight change. some fatigue  Eyes: Negative for pain and visual disturbance.    Respiratory: Negative for cough and shortness of breath.   Cardiovascular: Negative.for cp or sob    Gastrointestinal: Negative for nausea, diarrhea and constipation.  Genitourinary: Negative for urgency and frequency.  Skin: Negative for pallor. or rash  Neurological: Negative for weakness, light-headedness, numbness and headaches.  Hematological: Negative for adenopathy. Does not bruise/bleed easily.  Psychiatric/Behavioral: Negative for dysphoric mood. The patient is not nervous/anxious.         Objective:   Physical Exam  Constitutional: She appears well-developed and well-nourished. No distress.  HENT:  Head: Normocephalic.  Mouth/Throat: Oropharynx is clear and moist.  Eyes: Conjunctivae and EOM are normal. Pupils are equal, round, and reactive to light.  Neck: Normal range of motion. Neck supple. No JVD present. Carotid bruit is not present.       No change in thyroid exam   Cardiovascular: Normal rate, regular rhythm, normal heart sounds and intact distal pulses.   Pulmonary/Chest: Effort normal and breath sounds normal. No respiratory distress. She has no wheezes.  Abdominal: Soft. Bowel sounds are normal. She exhibits no distension, no abdominal bruit and no mass. There is no tenderness.  Musculoskeletal: Normal range of motion. She exhibits no edema and no tenderness.  Lymphadenopathy:    She has no cervical adenopathy.  Neurological: She is alert. She has normal reflexes. No cranial nerve deficit. Coordination normal.       No tremor   Skin: Skin is warm and dry. No rash noted. No erythema. No pallor.  Psychiatric: She has a normal mood and affect.       Not anxious           Assessment & Plan:

## 2011-02-10 NOTE — Patient Instructions (Signed)
Go back to taking amlodipine every day If bp goes below 90/60 please let me know If you think one of your medicines makes you dizzy- update me  Labs today  Please work on low salt diet and also exercise  Follow up with me in 6 months  Keep checking blood pressure at home

## 2011-02-10 NOTE — Assessment & Plan Note (Signed)
Pt is feeling more tired lately Check tsh Could also be abx for uti Will continue to monitor (and pt is seeing urologist )

## 2011-02-10 NOTE — Assessment & Plan Note (Signed)
a1c today with labs Disc imp of low glycemic diet

## 2011-02-10 NOTE — Assessment & Plan Note (Signed)
Lab today Disc optimal low sat fat diet

## 2011-02-12 ENCOUNTER — Other Ambulatory Visit: Payer: Self-pay | Admitting: Family Medicine

## 2011-02-12 ENCOUNTER — Telehealth: Payer: Self-pay

## 2011-02-12 DIAGNOSIS — E871 Hypo-osmolality and hyponatremia: Secondary | ICD-10-CM

## 2011-02-12 NOTE — Telephone Encounter (Signed)
Message copied by Patience Musca on Thu Feb 12, 2011  6:10 PM ------      Message from: Roxy Manns A      Created: Thu Feb 12, 2011 12:39 PM       Sodium is mildly low again       Sugar is higher       Cut hctz in 1/2 and just take half pill once daily       Watch sugar closely in diet       Re check sodium level in 2 weeks for hyponatremia       Follow up with me in 3 mo instead of 6 -- need to watch sugar more closely       Other labs fairly stable

## 2011-02-12 NOTE — Telephone Encounter (Signed)
Patient notified as instructed by telephone.Lab appt scheduled as instructed 02/26/11 at 12noon and 3 mth f/u appt with Dr Milinda Antis 05/26/11 at 12noon. Future lab order was made. Med list updated with HCTZ instructions.

## 2011-02-26 ENCOUNTER — Other Ambulatory Visit: Payer: Medicare Other

## 2011-03-04 ENCOUNTER — Other Ambulatory Visit (INDEPENDENT_AMBULATORY_CARE_PROVIDER_SITE_OTHER): Payer: Medicare Other | Admitting: Family Medicine

## 2011-03-04 DIAGNOSIS — E871 Hypo-osmolality and hyponatremia: Secondary | ICD-10-CM

## 2011-05-26 ENCOUNTER — Encounter: Payer: Self-pay | Admitting: Family Medicine

## 2011-05-26 ENCOUNTER — Ambulatory Visit (INDEPENDENT_AMBULATORY_CARE_PROVIDER_SITE_OTHER): Payer: Medicare Other | Admitting: Family Medicine

## 2011-05-26 VITALS — BP 140/78 | HR 68 | Temp 98.1°F | Ht 63.0 in | Wt 186.8 lb

## 2011-05-26 DIAGNOSIS — R7309 Other abnormal glucose: Secondary | ICD-10-CM

## 2011-05-26 DIAGNOSIS — R300 Dysuria: Secondary | ICD-10-CM

## 2011-05-26 DIAGNOSIS — Z23 Encounter for immunization: Secondary | ICD-10-CM

## 2011-05-26 DIAGNOSIS — F32A Depression, unspecified: Secondary | ICD-10-CM

## 2011-05-26 DIAGNOSIS — F329 Major depressive disorder, single episode, unspecified: Secondary | ICD-10-CM

## 2011-05-26 DIAGNOSIS — I10 Essential (primary) hypertension: Secondary | ICD-10-CM

## 2011-05-26 MED ORDER — AMITRIPTYLINE HCL 10 MG PO TABS: ORAL_TABLET | ORAL | Status: DC

## 2011-05-26 NOTE — Assessment & Plan Note (Signed)
Pt is becoming newly depressed by chronic bladder discomfort - feeling helpless and has lost motivation to do anything at all - is frank about that "feels terrible all the time" Will try amitriptyline in hopes that will help the bladder problem/ sleep disorder and dep Warnings given - if more dep will stop it and call Also disc sleepiness and fall risk (will hold her ambien)  Start at 10 and titrate to 20-- slowly F/u 3 mo

## 2011-05-26 NOTE — Assessment & Plan Note (Signed)
Chronic without infx, seen by urol-- ? OB or poss IC Trial of amitriptyline too see if helpful F/u 3 wk Update if fever or sympt of infx

## 2011-05-26 NOTE — Progress Notes (Signed)
Subjective:    Patient ID: Kimberly [name], female    DOB: 1927/12/13, 77 y.o.   MRN: [id number]  HPI Here for f/u of HTN and hyperglycemia  Overall is tired all the time  Has chronic urinary pressure and freq urination -sees urology -- no recent infection  No meds have helped  Is struggling with that -- thinks it is depression  Not sad or tearful however    No fever or other symptoms   Wt is down 2 lb with bmi of 33  Hyperglycemia last a1c was just into DM range at 6.5 in July  Diet - has not been watching diet at all / too tired to fix her meals  Exercise- not at all    Did cut hctz in 1/2-- but pt is still taking a whole  This resolved itself  HTN - 140/78 today No cp or palpitation or ha  Is on norvasc and hct and cozaar   Patient Active Problem List  Diagnoses  . COLONIC POLYPS  . GOITER  . HYPOTHYROIDISM  . HYPERLIPIDEMIA  . ANEMIA, MILD  . ANXIETY  . DISORDERS, ORGANIC INSOMNIA NOS  . HYPERTENSION  . CORONARY ARTERY DISEASE  . BUNDLE BRANCH BLOCK  . HEMORRHOIDS  . ALLERGIC RHINITIS  . GERD  . IRRITABLE BOWEL SYNDROME  . HYPERGLYCEMIA  . PERSONAL HX COLONIC POLYPS  . Dysuria  . Lower abdominal pain  . UTI (lower urinary tract infection)  . Frequent UTI  . Depression   Past Medical History  Diagnosis Date  . Allergy     allergic rhinitis  . CAD (coronary artery disease)   . GERD (gastroesophageal reflux disease)   . Hyperlipidemia   . Hypertension   . Hypothyroid   . Insomnia   . Asthma    Past Surgical History  Procedure Date  . Oophorectomy   . Thyroid surgery    History  Substance Use Topics  . Smoking status: Never Smoker   . Smokeless tobacco: Not on file  . Alcohol Use: No   Family History  Problem Relation Age of Onset  . Heart disease Mother   . Hypertension Mother    Allergies  Allergen Reactions  . Cephalexin     REACTION: rash and burning sensation to skin on arms and legs with itching.  . Ciprofloxacin     REACTION:  burning of mouth and skin  . Lisinopril     REACTION: cough  . Penicillins     REACTION: rash  . Sulfonamide Derivatives     REACTION: malaise  . Tetanus Toxoid     REACTION: swelling  . Vesicare (Solifenacin Succinate) Other (See Comments)    Blurred vision and weakness   Current Outpatient Prescriptions on File Prior to Visit  Medication Sig Dispense Refill  . amLODipine (NORVASC) 5 MG tablet Take 5 mg by mouth daily.       Marland Kitchen aspirin 81 MG tablet Take 81 mg by mouth daily.        . cetirizine (ZYRTEC) 10 MG tablet 1 tablet by mouth daily as needed.       Marland Kitchen esomeprazole (NEXIUM) 40 MG capsule Take 40 mg by mouth daily.        Marland Kitchen guaiFENesin (MUCINEX) 600 MG 12 hr tablet OTC as directed.       . hydrochlorothiazide 25 MG tablet Take 25 mg by mouth daily.       Marland Kitchen levothyroxine (SYNTHROID, LEVOTHROID) 75 MCG tablet Take 75 mcg by  mouth daily.        Marland Kitchen losartan (COZAAR) 100 MG tablet Take 1 tablet (100 mg total) by mouth daily.  90 tablet  3  . metoprolol (LOPRESSOR) 50 MG tablet Take 1 tablet (50 mg total) by mouth 2 (two) times daily. 1 tablet by mouth two times a day.  180 tablet  3  . Multiple Vitamin (MULTIVITAMIN) capsule Take 1 capsule by mouth daily.        Marland Kitchen zolpidem (AMBIEN) 10 MG tablet 1/2 to 1 tablet by mouth at bedtime as needed.  30 tablet  3  . acetaminophen (TYLENOL) 500 MG tablet OTC As directed.       . Azelastine HCl (ASTEPRO) 0.15 % SOLN 1-2 sprays in each nostril two times a day for allergies as needed.      . hydrocortisone (ANUSOL-HC) 2.5 % rectal cream Apply to affected area up to two times a day as needed for hemorrhoids.       . Nitrofurantoin Monohyd Macro (MACROBID PO) Take 50 mg by mouth daily.        Marland Kitchen nystatin (MYCOSTATIN) cream Apply topically 2 (two) times daily.  30 g  0  . phenazopyridine (PYRIDIUM) 200 MG tablet Take 1 tablet (200 mg total) by mouth 3 (three) times daily as needed for pain.  20 tablet  0       Review of Systems Review of Systems    Constitutional: Negative for fever, appetite change, fatigue and unexpected weight change.  Eyes: Negative for pain and visual disturbance.  Respiratory: Negative for cough and shortness of breath.   Cardiovascular: Negative for cp or palpitations    Gastrointestinal: Negative for nausea, diarrhea and constipation.  Genitourinary: Negative for hematuria or fever/ pos for bladder pain/ dysuria and frequency Skin: Negative for pallor or rash   Neurological: Negative for weakness, light-headedness, numbness and headaches.  Hematological: Negative for adenopathy. Does not bruise/bleed easily.  Psychiatric/Behavioral: pos for dysphoric mood and loss of motivation- no SI          Objective:   Physical Exam  Constitutional: She appears well-developed and well-nourished. No distress.  HENT:  Head: Normocephalic and atraumatic.  Mouth/Throat: Oropharynx is clear and moist.  Eyes: Conjunctivae and EOM are normal. Pupils are equal, round, and reactive to light.  Neck: Normal range of motion. Neck supple. No JVD present. Carotid bruit is not present. Erythema present. No thyromegaly present.  Cardiovascular: Normal rate, regular rhythm, normal heart sounds and intact distal pulses.  Exam reveals no gallop.   Pulmonary/Chest: Effort normal and breath sounds normal. No respiratory distress. She has no wheezes.  Abdominal: Soft. Bowel sounds are normal. She exhibits no distension, no abdominal bruit and no mass. There is tenderness.       Suprapubic tenderness No cva tenderness   Musculoskeletal: She exhibits no edema.  Lymphadenopathy:    She has no cervical adenopathy.  Neurological: She is alert. She has normal reflexes. She exhibits normal muscle tone. Coordination normal.  Skin: Skin is warm and dry. No rash noted. No erythema. No pallor.  Psychiatric:       Tearful today when discussing her bladder problem- seemingly depressed Fair eye contact and good comm skills Talks freely           Assessment & Plan:

## 2011-05-26 NOTE — Assessment & Plan Note (Signed)
Rev last lab with a1c of 6.5 Pt simply not motivated to eat better in light of bladder pain problem Will work on that and depresssion - then focus on the sugar issue Disc low glycemic diet to work for  [employer] shot today

## 2011-05-26 NOTE — Patient Instructions (Signed)
Start the amitriptyline at bedtime as directed  This should make you sleepy so do not take the [place] with it  This medicine should help bladder discomfort / sleep/ frequent urination/ and depression  It may cause dry mouth - drink lots of water  If you get more depressed or have suicidal thoughts- stop the medicine and call  Continue follow ups with your urologist  When motivated - cut sugar in diet and try to get some exercise for weight loss Follow up with me in about 3 weeks

## 2011-05-28 ENCOUNTER — Telehealth: Payer: Self-pay | Admitting: *Deleted

## 2011-05-28 NOTE — Telephone Encounter (Signed)
Go ahead and increase the amitriptyline now to 20 mg and see how it goes tonight -- we can keep increasing the dose until it makes her sleep

## 2011-05-28 NOTE — Telephone Encounter (Signed)
Pt states that since she was told to stop [place] she has not been able to sleep.  She was told this on Tuesday when she was given amitriptyline.  She started amitriptyline 10 mg's on Tuesday night.  Is this something that will resolve itself?

## 2011-05-28 NOTE — Telephone Encounter (Signed)
Advised pt, she will call back to let Dr. Milinda Antis know if 20 mg's works.

## 2011-06-16 ENCOUNTER — Ambulatory Visit (INDEPENDENT_AMBULATORY_CARE_PROVIDER_SITE_OTHER): Payer: Medicare Other | Admitting: Family Medicine

## 2011-06-16 ENCOUNTER — Encounter: Payer: Self-pay | Admitting: Family Medicine

## 2011-06-16 VITALS — BP 136/72 | HR 76 | Temp 97.5°F | Ht 63.0 in | Wt 185.5 lb

## 2011-06-16 DIAGNOSIS — R300 Dysuria: Secondary | ICD-10-CM

## 2011-06-16 DIAGNOSIS — F329 Major depressive disorder, single episode, unspecified: Secondary | ICD-10-CM

## 2011-06-16 MED ORDER — AMITRIPTYLINE HCL 10 MG PO TABS: ORAL_TABLET | ORAL | Status: DC

## 2011-06-16 NOTE — Progress Notes (Signed)
Subjective:    Patient ID: Kimberly [name], female    DOB: Sep 15, 1927, 77 y.o.   MRN: [id number]  HPI Here for f/u of depression (due to bladder discomfort chronic )  Still has it some but overall better    Started amitriptyline last visit  Mood is better overall  Sleeps about 4-5 hours just on the amitriptyline  Would like to advance it  Is having some dry mouth  Is helping her sleep  Wt is stable with bmi of 32  Is feeling a bit more motivated every day to do things  For example - working around her house   Has also cut sugar in diet   Patient Active Problem List  Diagnoses  . COLONIC POLYPS  . GOITER  . HYPOTHYROIDISM  . HYPERLIPIDEMIA  . ANEMIA, MILD  . ANXIETY  . DISORDERS, ORGANIC INSOMNIA NOS  . HYPERTENSION  . CORONARY ARTERY DISEASE  . BUNDLE BRANCH BLOCK  . HEMORRHOIDS  . ALLERGIC RHINITIS  . GERD  . IRRITABLE BOWEL SYNDROME  . HYPERGLYCEMIA  . PERSONAL HX COLONIC POLYPS  . Dysuria  . Lower abdominal pain  . Frequent UTI  . Depression   Past Medical History  Diagnosis Date  . Allergy     allergic rhinitis  . CAD (coronary artery disease)   . GERD (gastroesophageal reflux disease)   . Hyperlipidemia   . Hypertension   . Hypothyroid   . Insomnia   . Asthma    Past Surgical History  Procedure Date  . Oophorectomy   . Thyroid surgery    History  Substance Use Topics  . Smoking status: Never Smoker   . Smokeless tobacco: Not on file  . Alcohol Use: No   Family History  Problem Relation Age of Onset  . Heart disease Mother   . Hypertension Mother    Allergies  Allergen Reactions  . Cephalexin     REACTION: rash and burning sensation to skin on arms and legs with itching.  . Ciprofloxacin     REACTION: burning of mouth and skin  . Lisinopril     REACTION: cough  . Penicillins     REACTION: rash  . Sulfonamide Derivatives     REACTION: malaise  . Tetanus Toxoid     REACTION: swelling  . Vesicare (Solifenacin Succinate) Other (See  Comments)    Blurred vision and weakness   Current Outpatient Prescriptions on File Prior to Visit  Medication Sig Dispense Refill  . amLODipine (NORVASC) 5 MG tablet Take 5 mg by mouth daily.       Marland Kitchen aspirin 81 MG tablet Take 81 mg by mouth daily.        . cetirizine (ZYRTEC) 10 MG tablet 1 tablet by mouth daily as needed.       Marland Kitchen esomeprazole (NEXIUM) 40 MG capsule Take 40 mg by mouth daily.        Marland Kitchen guaiFENesin (MUCINEX) 600 MG 12 hr tablet OTC as directed.       . hydrochlorothiazide 25 MG tablet Take 12.5 mg by mouth daily.       Marland Kitchen levothyroxine (SYNTHROID, LEVOTHROID) 75 MCG tablet Take 75 mcg by mouth daily.        Marland Kitchen losartan (COZAAR) 100 MG tablet Take 1 tablet (100 mg total) by mouth daily.  90 tablet  3  . Multiple Vitamin (MULTIVITAMIN) capsule Take 1 capsule by mouth daily.        Marland Kitchen acetaminophen (TYLENOL) 500 MG tablet OTC  As directed.       . Azelastine HCl (ASTEPRO) 0.15 % SOLN 1-2 sprays in each nostril two times a day for allergies as needed.      . hydrocortisone (ANUSOL-HC) 2.5 % rectal cream Apply to affected area up to two times a day as needed for hemorrhoids.       . Nitrofurantoin Monohyd Macro (MACROBID PO) Take 50 mg by mouth daily.        Marland Kitchen nystatin (MYCOSTATIN) cream Apply topically 2 (two) times daily.  30 g  0  . phenazopyridine (PYRIDIUM) 200 MG tablet Take 1 tablet (200 mg total) by mouth 3 (three) times daily as needed for pain.  20 tablet  0  . zolpidem (AMBIEN) 10 MG tablet 1/2 to 1 tablet by mouth at bedtime as needed.  30 tablet  3        Review of Systems Review of Systems  Constitutional: Negative for fever, appetite change, fatigue and unexpected weight change.  Eyes: Negative for pain and visual disturbance.  Respiratory: Negative for cough and shortness of breath.   Cardiovascular: Negative for cp or palpitations    Gastrointestinal: Negative for nausea, diarrhea and constipation.  Genitourinary: pos for chronic urgency and frequency and  bladder discomfort  Skin: Negative for pallor or rash   Neurological: Negative for weakness, light-headedness, numbness and headaches.  Hematological: Negative for adenopathy. Does not bruise/bleed easily.  Psychiatric/Behavioral: pos for depression that is improving  The patient is not nervous/anxious.          Objective:   Physical Exam  Constitutional: She appears well-developed and well-nourished. No distress.       overwt and well appearing   HENT:  Head: Normocephalic and atraumatic.  Mouth/Throat: Oropharynx is clear and moist.  Eyes: Conjunctivae and EOM are normal. Pupils are equal, round, and reactive to light. No scleral icterus.  Neck: Normal range of motion. Neck supple. No JVD present. No thyromegaly present.  Cardiovascular: Normal rate, regular rhythm and normal heart sounds.   Pulmonary/Chest: Effort normal and breath sounds normal.  Abdominal: Soft. Bowel sounds are normal.       No suprapubic tenderness    Musculoskeletal:       No cva tenderness   Lymphadenopathy:    She has no cervical adenopathy.  Neurological: She is alert. She has normal reflexes.       No tremor   Skin: Skin is warm and dry. No rash noted. No erythema. No pallor.  Psychiatric:       Improved affect - smiling and talkative           Assessment & Plan:

## 2011-06-16 NOTE — Patient Instructions (Signed)
Increase your amitriptyline to 30 mg once daily for 1 week and then to 40 mg once daily if you want to  If side effects, please back down  I'm glad you are doing better  Update me if any problems  Follow up in 2 months -- and will get back on track then

## 2011-06-16 NOTE — Assessment & Plan Note (Signed)
Thrilled this is improving - with good score on dep questionnaire as well  Is interested in adv the amitriptyline - so will go up to 30 ,and then 40 if tolerated  Will continue good water intake F/u in 2 mo

## 2011-06-16 NOTE — Assessment & Plan Note (Signed)
This has also improved with the elavil dosage  Pleased Will f/u with urologist in next several mo No other changes

## 2011-07-16 ENCOUNTER — Other Ambulatory Visit: Payer: Self-pay | Admitting: *Deleted

## 2011-07-16 MED ORDER — ESOMEPRAZOLE MAGNESIUM 40 MG PO CPDR: 40.0000 mg | DELAYED_RELEASE_CAPSULE | Freq: Every day | ORAL | Status: DC

## 2011-07-16 MED ORDER — LEVOTHYROXINE SODIUM 75 MCG PO TABS: 75.0000 ug | ORAL_TABLET | Freq: Every day | ORAL | Status: DC

## 2011-07-16 MED ORDER — LOSARTAN POTASSIUM 100 MG PO TABS: 100.0000 mg | ORAL_TABLET | Freq: Every day | ORAL | Status: DC

## 2011-07-16 MED ORDER — HYDROCHLOROTHIAZIDE 25 MG PO TABS: 12.5000 mg | ORAL_TABLET | Freq: Every day | ORAL | Status: DC

## 2011-07-16 NOTE — Telephone Encounter (Signed)
rx sent to pharmacy by e-script  

## 2011-08-14 ENCOUNTER — Ambulatory Visit: Payer: Medicare Other | Admitting: Family Medicine

## 2011-08-18 ENCOUNTER — Telehealth: Payer: Self-pay | Admitting: Family Medicine

## 2011-08-18 ENCOUNTER — Encounter: Payer: Self-pay | Admitting: Family Medicine

## 2011-08-18 ENCOUNTER — Ambulatory Visit (INDEPENDENT_AMBULATORY_CARE_PROVIDER_SITE_OTHER): Payer: Medicare Other | Admitting: Family Medicine

## 2011-08-18 VITALS — BP 142/80 | HR 84 | Temp 98.5°F | Ht 63.0 in | Wt 182.2 lb

## 2011-08-18 DIAGNOSIS — F3289 Other specified depressive episodes: Secondary | ICD-10-CM

## 2011-08-18 DIAGNOSIS — F329 Major depressive disorder, single episode, unspecified: Secondary | ICD-10-CM

## 2011-08-18 DIAGNOSIS — I10 Essential (primary) hypertension: Secondary | ICD-10-CM

## 2011-08-18 DIAGNOSIS — E039 Hypothyroidism, unspecified: Secondary | ICD-10-CM

## 2011-08-18 DIAGNOSIS — R7309 Other abnormal glucose: Secondary | ICD-10-CM

## 2011-08-18 LAB — COMPREHENSIVE METABOLIC PANEL
ALT: 17 U/L (ref 0–35)
CO2: 26 mEq/L (ref 19–32)
Calcium: 9.1 mg/dL (ref 8.4–10.5)
Chloride: 98 mEq/L (ref 96–112)
GFR: 52.22 mL/min — ABNORMAL LOW (ref >60.00)
Glucose, Bld: 110 mg/dL — ABNORMAL HIGH (ref 70–99)
Sodium: 135 mEq/L (ref 135–145)
Total Protein: 7.4 g/dL (ref 6.0–8.3)

## 2011-08-18 LAB — TSH: TSH: 0.14 u[IU]/mL — ABNORMAL LOW (ref 0.35–5.50)

## 2011-08-18 MED ORDER — LEVOTHYROXINE SODIUM 50 MCG PO TABS
50.0000 ug | ORAL_TABLET | Freq: Every day | ORAL | Status: DC
Start: 2011-08-18 — End: 2012-08-04

## 2011-08-18 NOTE — Assessment & Plan Note (Signed)
bp in fair control at this time  No changes needed  Disc lifstyle change with low sodium diet and exercise  Lab today  F/u 6 mo  

## 2011-08-18 NOTE — Assessment & Plan Note (Signed)
a1c today Diet is stable  Enc more exercise No excessive thirst or urination Needs to loose wt

## 2011-08-18 NOTE — Patient Instructions (Signed)
Drop back down on the amitriptyline from 40 to 20 mg each night (that is just 2 pills a night) I'm glad your mood is better - but I wonder if the higher dose was causing heart to race If symptoms persist more than 2 weeks after dropping dose- call us and let me know  Labs today  Try to stay active and eat healthy diet  Follow up in 6 months

## 2011-08-18 NOTE — Assessment & Plan Note (Signed)
Doing about the same, overall inc the elavil to 40 has not helped and may be causing side eff of dry mouth and racing heart in middle of the night (no problem before on 20) Will drop back to 20  Call in 2 wk if not imp  Mood is good Enc to stay active

## 2011-08-18 NOTE — Assessment & Plan Note (Signed)
tsh today in light of night time palpitation  Goiter unchanged slt hand tremor

## 2011-08-18 NOTE — Progress Notes (Signed)
Subjective:    Patient ID: Kimberly [name], female    DOB: [birth year], 78 y.o.   MRN: [id number]  HPI Here for f/u of depression and hyperglycemia Has been feeling pretty good in general   Sometimes wakes up with palpitations- and pulse will go to 90s    bp is 142/80 today Wt is down 3 lb with bmi of 32   a1c last (july) was 6.5  Diet is overall good - avoid sweets and starches   Depression- did inc her amitriptyline to 40 mg at bedtime No really difference from the 20 mg  Is sleeping ok at night  Appetite is ok   Patient Active Problem List  Diagnoses  . COLONIC POLYPS  . GOITER  . HYPOTHYROIDISM  . HYPERLIPIDEMIA  . ANEMIA, MILD  . ANXIETY  . DISORDERS, ORGANIC INSOMNIA NOS  . HYPERTENSION  . CORONARY ARTERY DISEASE  . BUNDLE BRANCH BLOCK  . HEMORRHOIDS  . ALLERGIC RHINITIS  . GERD  . IRRITABLE BOWEL SYNDROME  . HYPERGLYCEMIA  . PERSONAL HX COLONIC POLYPS  . Dysuria  . Lower abdominal pain  . Frequent UTI  . Depression   Past Medical History  Diagnosis Date  . Allergy     allergic rhinitis  . CAD (coronary artery disease)   . GERD (gastroesophageal reflux disease)   . Hyperlipidemia   . Hypertension   . Hypothyroid   . Insomnia   . Asthma    Past Surgical History  Procedure Date  . Oophorectomy   . Thyroid surgery    History  Substance Use Topics  . Smoking status: Never Smoker   . Smokeless tobacco: Not on file  . Alcohol Use: No   Family History  Problem Relation Age of Onset  . Heart disease Mother   . Hypertension Mother    Allergies  Allergen Reactions  . Cephalexin     REACTION: rash and burning sensation to skin on arms and legs with itching.  . Ciprofloxacin     REACTION: burning of mouth and skin  . Lisinopril     REACTION: cough  . Penicillins     REACTION: rash  . Sulfonamide Derivatives     REACTION: malaise  . Tetanus Toxoid     REACTION: swelling  . Vesicare (Solifenacin Succinate) Other (See Comments)    Blurred  vision and weakness   Current Outpatient Prescriptions on File Prior to Visit  Medication Sig Dispense Refill  . acetaminophen (TYLENOL) 500 MG tablet OTC As directed.       Marland Kitchen amLODipine (NORVASC) 5 MG tablet Take 5 mg by mouth daily.       Marland Kitchen aspirin 81 MG tablet Take 81 mg by mouth daily.        . cetirizine (ZYRTEC) 10 MG tablet 1 tablet by mouth daily as needed.       Marland Kitchen esomeprazole (NEXIUM) 40 MG capsule Take 1 capsule (40 mg total) by mouth daily.  90 capsule  3  . guaiFENesin (MUCINEX) 600 MG 12 hr tablet OTC as directed.       . hydrochlorothiazide (HYDRODIURIL) 25 MG tablet Take 0.5 tablets (12.5 mg total) by mouth daily.  90 tablet  3  . levothyroxine (SYNTHROID, LEVOTHROID) 75 MCG tablet Take 1 tablet (75 mcg total) by mouth daily.  90 tablet  3  . losartan (COZAAR) 100 MG tablet Take 1 tablet (100 mg total) by mouth daily.  90 tablet  3  . metoprolol (LOPRESSOR) 50 MG tablet  Take 50 mg by mouth 2 (two) times daily.        . Multiple Vitamin (MULTIVITAMIN) capsule Take 1 capsule by mouth daily.        . Azelastine HCl (ASTEPRO) 0.15 % SOLN 1-2 sprays in each nostril two times a day for allergies as needed.      . hydrocortisone (ANUSOL-HC) 2.5 % rectal cream Apply to affected area up to two times a day as needed for hemorrhoids.       . Nitrofurantoin Monohyd Macro (MACROBID PO) Take 50 mg by mouth daily.        Marland Kitchen nystatin (MYCOSTATIN) cream Apply topically 2 (two) times daily.  30 g  0  . phenazopyridine (PYRIDIUM) 200 MG tablet Take 1 tablet (200 mg total) by mouth 3 (three) times daily as needed for pain.  20 tablet  0  . zolpidem (AMBIEN) 10 MG tablet 1/2 to 1 tablet by mouth at bedtime as needed.  30 tablet  3      Review of Systems Review of Systems  Constitutional: Negative for fever, appetite change, fatigue and unexpected weight change.  Eyes: Negative for pain and visual disturbance.  Respiratory: Negative for cough and shortness of breath.   Cardiovascular: Negative  for cp and pos for palpitations  Gastrointestinal: Negative for nausea, diarrhea and constipation.  Genitourinary: Negative for urgency and frequency.  Skin: Negative for pallor or rash   Neurological: Negative for weakness, light-headedness, numbness and headaches.  Hematological: Negative for adenopathy. Does not bruise/bleed easily.  Psychiatric/Behavioral: Negative for dysphoric mood (depression is improved). The patient is not nervous/anxious.          Objective:   Physical Exam  Constitutional: She appears well-developed and well-nourished. No distress.       overwt and well appearing   HENT:  Head: Normocephalic and atraumatic.  Mouth/Throat: Oropharynx is clear and moist.  Eyes: Conjunctivae and EOM are normal. Pupils are equal, round, and reactive to light. No scleral icterus.  Neck: Normal range of motion. Neck supple. No JVD present. Carotid bruit is not present. Thyromegaly present.  Cardiovascular: Normal rate, regular rhythm, normal heart sounds and intact distal pulses.  Exam reveals no gallop.   Pulmonary/Chest: Effort normal and breath sounds normal. No respiratory distress. She has no rales. She exhibits no tenderness.  Abdominal: Soft. Bowel sounds are normal. She exhibits no distension, no abdominal bruit and no mass. There is no tenderness.  Musculoskeletal: Normal range of motion. She exhibits no edema and no tenderness.  Lymphadenopathy:    She has no cervical adenopathy.  Neurological: She is alert. She has normal reflexes. She displays no tremor. No cranial nerve deficit. She exhibits normal muscle tone. Coordination normal.  Skin: Skin is warm and dry. No rash noted. No erythema. No pallor.  Psychiatric: She has a normal mood and affect.       Fairly good mood Nl comm skills and eye contact  Alert and mentally sharp          Assessment & Plan:

## 2011-08-18 NOTE — Telephone Encounter (Signed)
Labs ok but she has too much thyroid hormone so I need to cut her dose This could be another reason her heart has been racing Current dose 75 mcg and we are going to cut it to 50 Re check tsh for hypothyroidism in 1 mo please  Px written for call in

## 2011-08-19 ENCOUNTER — Other Ambulatory Visit: Payer: Self-pay | Admitting: Family Medicine

## 2011-08-19 DIAGNOSIS — E039 Hypothyroidism, unspecified: Secondary | ICD-10-CM

## 2011-08-19 NOTE — Telephone Encounter (Signed)
Patient notified as instructed by telephone. Medication phoned to [facility] as instructed.Lab appt scheduled as instructed 09/22/11 at 11am..

## 2011-08-31 ENCOUNTER — Telehealth: Payer: Self-pay | Admitting: Family Medicine

## 2011-08-31 NOTE — Telephone Encounter (Signed)
Triage Record Num: [id number] Operator: Valene Bors Patient Name: Kimberly [name] Call Date & Time: 08/31/2011 12:40:44PM Patient Phone: [phone] PCP: Audrie Gallus. Tower Patient Gender: Female PCP Fax : Patient DOB: 07/30/1928 Practice Name: [facility] Reason for Call: Caller: Kwana/Patient; PCP: Roxy Manns A.; CB#: [phone]; Call regarding Pt Had Rapid HR Issues Was Instructed to decrease Antidressant Medication: Amitriptyline and Call Back in two weeks and Let Dr Milinda Antis Know How She Is Doing; Heart is still racing at times and skips beats. Heart Rate=96 this morning and BP 95/55- after first woke up. No other sx. Care advice per Irregular HeartRate Protocol /[facility] [place]/ Disp: Call Provider Immediately. PLEASE LET DR. Milinda Antis KNOW THAT HER SX PERSIST AND FURTHER INSTRUCTION NEEDED. Marland Kitchen Protocol(s) Used: Irregular Heartbeat Recommended Outcome per Protocol: Call Provider Immediately Reason for Outcome: Symptoms began after changing dose or starting new prescription, nonprescription, or alternative medication AND now has an irregular pulse or pulse rate more than 90 beats/minute at rest Care Advice: Avoid caffeine (coffee, tea, cola drinks, or chocolate), alcohol, and nicotine (use of tobacco), as use of these substances may worsen symptoms. ~ Change position slowly. Sit for a couple of minutes before standing to walk. Quick position changes may cause or worsen symptoms. ~ Avoid any use of non-prescribed stimulants including over-the-counter (diet pills, no-doze pills, pseudoephedrine); herbal (ephedra, ma-huang, yohimbi, dong-quai); prescription (Adderall, Dexedrine, Ritalin); or illegal (cocaine, crack, amphetamines, methamphetamines) drugs. ~ ~ IMMEDIATE ACTION ~ CAUTIONS Call EMS 911 if having chest pain, sudden severe shortness of breath, loss of consciousness, or signs of shock (such as unable to stand due to faintness, dizziness, or lightheadedness;  new onset of confusion; slow to respond or difficult to awaken; skin is pale, gray, cool, or moist to touch; severe weakness). ~ See another provider immediately if unable to talk with your provider within 1 hour. Consider the closest urgent care or ED if an office or clinic is not available. Another adult should drive. ~ Medication Advice: - Discontinue all nonprescription and alternative medications, especially stimulants, until evaluated by provider. - Take prescribed medications as directed, following label instructions for the medication. - Do not change medications or dosing regimen until provider is consulted. - Know possible side effects of medication and what to do if they occur. - Tell provider all prescription, nonprescription or alternative medications that you take ~ 08/31/2011 12:58:02PM Page 1 of 1 [id number]

## 2011-08-31 NOTE — Telephone Encounter (Signed)
Patient notified as instructed by telephone. Kimberly [name] unblocked 10 am on 09/01/11 and scheduled appt.

## 2011-08-31 NOTE — Telephone Encounter (Signed)
Since symptoms persist- please have her follow up when able to re check / do ekg May also be due to thyroid- but should be improving with lessened dose

## 2011-09-01 ENCOUNTER — Encounter: Payer: Self-pay | Admitting: Family Medicine

## 2011-09-01 ENCOUNTER — Ambulatory Visit (INDEPENDENT_AMBULATORY_CARE_PROVIDER_SITE_OTHER): Payer: Medicare Other | Admitting: Family Medicine

## 2011-09-01 ENCOUNTER — Ambulatory Visit: Payer: Medicare Other | Admitting: Internal Medicine

## 2011-09-01 VITALS — BP 122/68 | HR 84 | Temp 98.1°F | Ht 63.0 in | Wt 183.2 lb

## 2011-09-01 DIAGNOSIS — G4700 Insomnia, unspecified: Secondary | ICD-10-CM

## 2011-09-01 DIAGNOSIS — E039 Hypothyroidism, unspecified: Secondary | ICD-10-CM

## 2011-09-01 DIAGNOSIS — F329 Major depressive disorder, single episode, unspecified: Secondary | ICD-10-CM

## 2011-09-01 DIAGNOSIS — R002 Palpitations: Secondary | ICD-10-CM | POA: Insufficient documentation

## 2011-09-01 DIAGNOSIS — R000 Tachycardia, unspecified: Secondary | ICD-10-CM

## 2011-09-01 MED ORDER — ZOLPIDEM TARTRATE 10 MG PO TABS: ORAL_TABLET | ORAL | Status: DC

## 2011-09-01 NOTE — Assessment & Plan Note (Signed)
Did decrease that dose for low tsh- too early to check now Has not helped her palpitations

## 2011-09-01 NOTE — Patient Instructions (Signed)
Stop your elavil (amitriptyline) for the next 1-2 weeks -then call and let me know if the palpitations or burning sensations improve If not improved - will refer you to cardiology If improved - we can try something else instead of the elavil  Update me also if you feel bad in any other [place] or depressed

## 2011-09-01 NOTE — Assessment & Plan Note (Signed)
Though elavil helped much- pt thinks it is causing her palpitations and also a burning sensation of her skin Will stop it and update in 1-2 weeks If improved - may need to disc another med

## 2011-09-01 NOTE — Progress Notes (Signed)
Subjective:    Patient ID: Kimberly [name], female    DOB: 05/18/1928, 78 y.o.   MRN: [id number]  HPI Here for palpitations No imp with dec in elavil or thyroid dose  Her pulse tends to go up and down  Fast in the am -- was 100 this am   ( before lopressor)  Through the day -- goes up and down with no reason - even at just at rest  Lowest is generally in 50s   She does take lopressor bid   Feels like a pounding sensation  Very seldom skips a beat  No cp or sob or diaphoresis or nausea   Depression had improved  More stressed now due to pulse   Last cardiac work up was in [place] -- a year ago -- for some sharp pains- was reassured and everything checked out all right   ? If elavil was the cause  Skin felt burning at night   ekg - shows LBBB- her baseline   Cardiac cath nl 06  Cardiac w/u hosp 5/12 - Dr Daleen Squibb - was ok  Has had mild/ non obs CAD in past  Patient Active Problem List  Diagnoses  . COLONIC POLYPS  . GOITER  . HYPOTHYROIDISM  . HYPERLIPIDEMIA  . ANEMIA, MILD  . ANXIETY  . DISORDERS, ORGANIC INSOMNIA NOS  . HYPERTENSION  . CORONARY ARTERY DISEASE  . BUNDLE BRANCH BLOCK  . HEMORRHOIDS  . ALLERGIC RHINITIS  . GERD  . IRRITABLE BOWEL SYNDROME  . HYPERGLYCEMIA  . PERSONAL HX COLONIC POLYPS  . Dysuria  . Lower abdominal pain  . Frequent UTI  . Depression  . Heart palpitations   Past Medical History  Diagnosis Date  . Allergy     allergic rhinitis  . CAD (coronary artery disease)   . GERD (gastroesophageal reflux disease)   . Hyperlipidemia   . Hypertension   . Hypothyroid   . Insomnia   . Asthma    Past Surgical History  Procedure Date  . Oophorectomy   . Thyroid surgery    History  Substance Use Topics  . Smoking status: Never Smoker   . Smokeless tobacco: Not on file  . Alcohol Use: No   Family History  Problem Relation Age of Onset  . Heart disease Mother   . Hypertension Mother    Allergies  Allergen Reactions  . Cephalexin      REACTION: rash and burning sensation to skin on arms and legs with itching.  . Ciprofloxacin     REACTION: burning of mouth and skin  . Lisinopril     REACTION: cough  . Penicillins     REACTION: rash  . Sulfonamide Derivatives     REACTION: malaise  . Tetanus Toxoid     REACTION: swelling  . Vesicare (Solifenacin Succinate) Other (See Comments)    Blurred vision and weakness   Current Outpatient Prescriptions on File Prior to Visit  Medication Sig Dispense Refill  . acetaminophen (TYLENOL) 500 MG tablet OTC As directed.       Marland Kitchen amLODipine (NORVASC) 5 MG tablet Take 5 mg by mouth daily.       Marland Kitchen aspirin 81 MG tablet Take 81 mg by mouth daily.        . cetirizine (ZYRTEC) 10 MG tablet 1 tablet by mouth daily as needed.       Marland Kitchen esomeprazole (NEXIUM) 40 MG capsule Take 1 capsule (40 mg total) by mouth daily.  90 capsule  3  . guaiFENesin (MUCINEX) 600 MG 12 hr tablet OTC as directed.       . hydrochlorothiazide (HYDRODIURIL) 25 MG tablet Take 0.5 tablets (12.5 mg total) by mouth daily.  90 tablet  3  . levothyroxine (SYNTHROID) 50 MCG tablet Take 1 tablet (50 mcg total) by mouth daily.  30 tablet  11  . losartan (COZAAR) 100 MG tablet Take 1 tablet (100 mg total) by mouth daily.  90 tablet  3  . metoprolol (LOPRESSOR) 50 MG tablet Take 50 mg by mouth 2 (two) times daily.        . Multiple Vitamin (MULTIVITAMIN) capsule Take 1 capsule by mouth daily.        . Azelastine HCl (ASTEPRO) 0.15 % SOLN 1-2 sprays in each nostril two times a day for allergies as needed.      . hydrocortisone (ANUSOL-HC) 2.5 % rectal cream Apply to affected area up to two times a day as needed for hemorrhoids.       . Nitrofurantoin Monohyd Macro (MACROBID PO) Take 50 mg by mouth daily.        Marland Kitchen nystatin (MYCOSTATIN) cream Apply topically 2 (two) times daily.  30 g  0  . phenazopyridine (PYRIDIUM) 200 MG tablet Take 1 tablet (200 mg total) by mouth 3 (three) times daily as needed for pain.  20 tablet  0           Review of Systems Review of Systems  Constitutional: Negative for fever, appetite change, fatigue and unexpected weight change.  Eyes: Negative for pain and visual disturbance.  Respiratory: Negative for cough and shortness of breath.   Cardiovascular: Negative for cp and pos for palpitations  Gastrointestinal: Negative for nausea, diarrhea and constipation.  Genitourinary: Negative for urgency and frequency.  Skin: Negative for pallor or rash   Neurological: Negative for weakness, light-headedness, numbness and headaches.  Hematological: Negative for adenopathy. Does not bruise/bleed easily.  Psychiatric/Behavioral: depression improved with elavil, some anxiety, however           Objective:   Physical Exam  Constitutional: She appears well-developed and well-nourished. No distress.  HENT:  Head: Normocephalic and atraumatic.  Mouth/Throat: Oropharynx is clear and moist.  Eyes: Conjunctivae and EOM are normal. Pupils are equal, round, and reactive to light. No scleral icterus.  Neck: Normal range of motion. Neck supple. No JVD present. Carotid bruit is not present. No thyromegaly present.  Cardiovascular: Normal rate, regular rhythm, normal heart sounds and intact distal pulses.  Exam reveals no gallop.   Pulmonary/Chest: Effort normal and breath sounds normal. No respiratory distress. She has no wheezes. She exhibits no tenderness.  Abdominal: Soft. She exhibits no distension, no abdominal bruit and no mass. There is no tenderness.  Musculoskeletal: Normal range of motion. She exhibits no edema and no tenderness.  Lymphadenopathy:    She has no cervical adenopathy.  Neurological: She is alert. She has normal reflexes. No cranial nerve deficit. She exhibits normal muscle tone. Coordination normal.  Skin: Skin is warm and dry. No rash noted. No erythema. No pallor.  Psychiatric: Her behavior is normal. Judgment and thought content normal.       Seems overall mildly  anxious Pleasant with good eye contact and comm skills           Assessment & Plan:

## 2011-09-01 NOTE — Assessment & Plan Note (Addendum)
At first thought due to higher dose of elavil- no imp with dec dose Last tsh low and dose was changed recently EKG today sows sinus rhythm rate 72 with LBBB (her baseline) Rev neg cardiac cath 06 Rev another neg cardiac w/u 5/12 Her symptoms are atypical  She thinks due to elavil- will hold it and update If not imp- will consider ref cardiol for a holter or other eval

## 2011-09-15 ENCOUNTER — Telehealth: Payer: Self-pay | Admitting: Family Medicine

## 2011-09-15 MED ORDER — MIRTAZAPINE 15 MG PO TABS: 15.0000 mg | ORAL_TABLET | Freq: Every day | ORAL | Status: DC

## 2011-09-15 NOTE — Telephone Encounter (Signed)
Patient notified as instructed by telephone. Pt said her mood is not good, she feels depressed and doesn't want to do anything. I advised pt I would call back with Dr Royden Purl response.

## 2011-09-15 NOTE — Telephone Encounter (Signed)
Patient was seen a couple weeks ago and was told to call back to let you know how she's feeling.  Patient said she's feeling better.

## 2011-09-15 NOTE — Telephone Encounter (Signed)
I'm glad she is feeling better - update me if symptoms return or worsen

## 2011-09-15 NOTE — Telephone Encounter (Signed)
Only if her depression is worse - let me know how her mood is - if ok , will not start anything new

## 2011-09-15 NOTE — Telephone Encounter (Signed)
Patient notified as instructed by telephone. Pt said when last seen was told if did get [facility] after stopping Elavil Dr Milinda Antis might try another med instead of Elavil. Please advise. [facility] uses [facility] [place] if pharmacy needed.

## 2011-09-15 NOTE — Telephone Encounter (Signed)
I want to go ahead and try her on mirtazapine - this will make her sleepy/ help her sleep and mood and may increase appetite also  Will send electronically Folllow  Up with me in 1-2 mo if not already planned to see how she is doing  Update me if depression worsens or other side eff  .

## 2011-09-17 NOTE — Telephone Encounter (Signed)
Patient notified as instructed by telephone. Pt said she would call back for F/U appt because she may get someone to come with her.

## 2011-09-22 ENCOUNTER — Other Ambulatory Visit: Payer: Medicare Other

## 2011-12-04 ENCOUNTER — Ambulatory Visit (INDEPENDENT_AMBULATORY_CARE_PROVIDER_SITE_OTHER): Payer: Medicare Other | Admitting: Family Medicine

## 2011-12-04 ENCOUNTER — Encounter: Payer: Self-pay | Admitting: Family Medicine

## 2011-12-04 VITALS — BP 136/62 | HR 68 | Temp 98.1°F | Ht 63.0 in | Wt 187.8 lb

## 2011-12-04 DIAGNOSIS — F411 Generalized anxiety disorder: Secondary | ICD-10-CM

## 2011-12-04 DIAGNOSIS — E669 Obesity, unspecified: Secondary | ICD-10-CM

## 2011-12-04 DIAGNOSIS — E039 Hypothyroidism, unspecified: Secondary | ICD-10-CM

## 2011-12-04 DIAGNOSIS — I10 Essential (primary) hypertension: Secondary | ICD-10-CM

## 2011-12-04 DIAGNOSIS — F329 Major depressive disorder, single episode, unspecified: Secondary | ICD-10-CM

## 2011-12-04 DIAGNOSIS — K2970 Gastritis, unspecified, without bleeding: Secondary | ICD-10-CM | POA: Insufficient documentation

## 2011-12-04 MED ORDER — SERTRALINE HCL 25 MG PO TABS: 25.0000 mg | ORAL_TABLET | Freq: Every day | ORAL | Status: DC

## 2011-12-04 MED ORDER — RANITIDINE HCL 150 MG PO TABS: 150.0000 mg | ORAL_TABLET | Freq: Two times a day (BID) | ORAL | Status: DC

## 2011-12-04 NOTE — Progress Notes (Signed)
Subjective:    Patient ID: Kimberly [name], female    DOB: August 10, 1927, 78 y.o.   MRN: [id number]  HPI Here for f/u of chronic conditions including mood disorder/ hypothyroid and HTN  Also new abd pain   Doing ok overall   Upset stomach - since yesterday -- pain in upper abd / is dull  Couple of days  Took maalox and pepto - that helps  Was very constipated from remeron - had to stop it - took MOM  Off remeron for 2 weeks - is better now   bp is  136/62   Today No cp or palpitations or headaches or edema  No side effects to medicines    Wt is up 4 lb with bmi of 33  Lab Results  Component Value Date   TSH 0.14* 08/18/2011   dose was adj Due for that   Off elavil now - palpitations Tried remeron - too constipated  Mood - still feels very nervous all the time - is a worrier   Patient Active Problem List  Diagnoses  . COLONIC POLYPS  . GOITER  . HYPOTHYROIDISM  . HYPERLIPIDEMIA  . ANEMIA, MILD  . ANXIETY  . DISORDERS, ORGANIC INSOMNIA NOS  . HYPERTENSION  . CORONARY ARTERY DISEASE  . BUNDLE BRANCH BLOCK  . HEMORRHOIDS  . ALLERGIC RHINITIS  . GERD  . IRRITABLE BOWEL SYNDROME  . HYPERGLYCEMIA  . PERSONAL HX COLONIC POLYPS  . Dysuria  . Lower abdominal pain  . Frequent UTI  . Depression  . Heart palpitations  . Obesity  . Gastritis   Past Medical History  Diagnosis Date  . Allergy     allergic rhinitis  . CAD (coronary artery disease)   . GERD (gastroesophageal reflux disease)   . Hyperlipidemia   . Hypertension   . Hypothyroid   . Insomnia   . Asthma    Past Surgical History  Procedure Date  . Oophorectomy   . Thyroid surgery    History  Substance Use Topics  . Smoking status: Never Smoker   . Smokeless tobacco: Not on file  . Alcohol Use: No   Family History  Problem Relation Age of Onset  . Heart disease Mother   . Hypertension Mother    Allergies  Allergen Reactions  . Cephalexin     REACTION: rash and burning sensation to skin on arms  and legs with itching.  . Ciprofloxacin     REACTION: burning of mouth and skin  . Elavil (Amitriptyline)     palpitation  . Lisinopril     REACTION: cough  . Penicillins     REACTION: rash  . Remeron (Mirtazapine)     constipation  . Sulfonamide Derivatives     REACTION: malaise  . Tetanus Toxoid     REACTION: swelling  . Vesicare (Solifenacin Succinate) Other (See Comments)    Blurred vision and weakness   Current Outpatient Prescriptions on File Prior to Visit  Medication Sig Dispense Refill  . acetaminophen (TYLENOL) 500 MG tablet OTC As directed.       Marland Kitchen amLODipine (NORVASC) 5 MG tablet Take 5 mg by mouth daily.       Marland Kitchen aspirin 81 MG tablet Take 81 mg by mouth daily.        . cetirizine (ZYRTEC) 10 MG tablet 1 tablet by mouth daily as needed.       Marland Kitchen esomeprazole (NEXIUM) 40 MG capsule Take 1 capsule (40 mg total) by mouth daily.  90 capsule  3  . guaiFENesin (MUCINEX) 600 MG 12 hr tablet OTC as directed.       . hydrochlorothiazide (HYDRODIURIL) 25 MG tablet Take 0.5 tablets (12.5 mg total) by mouth daily.  90 tablet  3  . levothyroxine (SYNTHROID) 50 MCG tablet Take 1 tablet (50 mcg total) by mouth daily.  30 tablet  11  . losartan (COZAAR) 100 MG tablet Take 1 tablet (100 mg total) by mouth daily.  90 tablet  3  . metoprolol (LOPRESSOR) 50 MG tablet Take 50 mg by mouth 2 (two) times daily.        . Multiple Vitamin (MULTIVITAMIN) capsule Take 1 capsule by mouth daily.        Marland Kitchen zolpidem (AMBIEN) 10 MG tablet 1/2 to 1 tablet by mouth at bedtime as needed.  30 tablet  3  . ranitidine (ZANTAC) 150 MG tablet Take 1 tablet (150 mg total) by mouth 2 (two) times daily.  60 tablet  5  . sertraline (ZOLOFT) 25 MG tablet Take 1 tablet (25 mg total) by mouth daily.  30 tablet  3       Review of Systems Review of Systems  Constitutional: Negative for fever, appetite change, and unexpected weight change. pos for fatigue  Eyes: Negative for pain and visual disturbance.    Respiratory: Negative for cough and shortness of breath.   Cardiovascular: Negative for cp or palpitations    Gastrointestinal: Negative for nausea, diarrhea and constipation.  Genitourinary: Negative for urgency and frequency.  Skin: Negative for pallor or rash   Neurological: Negative for weakness, light-headedness, numbness and headaches.  Hematological: Negative for adenopathy. Does not bruise/bleed easily.  Psychiatric/Behavioral: pos for depression and anxiety         Objective:   Physical Exam  Constitutional: She appears well-developed and well-nourished. No distress.  HENT:  Head: Normocephalic and atraumatic.  Mouth/Throat: Oropharynx is clear and moist.  Eyes: Conjunctivae and EOM are normal. Pupils are equal, round, and reactive to light. Right eye exhibits no discharge. Left eye exhibits no discharge. No scleral icterus.  Neck: Normal range of motion. Neck supple. No JVD present. Carotid bruit is not present. No thyromegaly present.  Cardiovascular: Normal rate, regular rhythm, normal heart sounds and intact distal pulses.  Exam reveals no gallop.   Pulmonary/Chest: Effort normal and breath sounds normal. No respiratory distress. She has no wheezes.  Abdominal: Soft. Bowel sounds are normal. She exhibits no distension and no mass. There is tenderness.       Mild epigastric tenderness  Musculoskeletal: Normal range of motion. She exhibits no edema and no tenderness.  Lymphadenopathy:    She has no cervical adenopathy.  Neurological: She is alert. She has normal reflexes. No cranial nerve deficit. She exhibits normal muscle tone. Coordination normal.  Skin: Skin is warm and dry. No rash noted. No erythema. No pallor.  Psychiatric: Her speech is normal and behavior is normal. Judgment normal. Her mood appears anxious. Her affect is not blunt and not labile. Cognition and memory are normal. She exhibits a depressed mood.       Overall slt improved affect Mildly depressed and  anxious           Assessment & Plan:

## 2011-12-04 NOTE — Assessment & Plan Note (Signed)
tsh low last time and dose adj No sympt except anxiety Nl exam tsh today and update

## 2011-12-04 NOTE — Assessment & Plan Note (Signed)
Discussed how this problem influences overall health and the risks it imposes  Reviewed plan for weight loss with lower calorie diet (via better food choices and also portion control or program like weight watchers) and exercise building up to or more than 30 minutes 5 days per week including some aerobic activity    

## 2011-12-04 NOTE — Assessment & Plan Note (Signed)
No success with elavil or remeron due to side eff Will try zoloft 25 at bedtime Poss sid eff disc No particular stressors Also work on getting tsh theraputic

## 2011-12-04 NOTE — Patient Instructions (Signed)
Checking thyroid today  Try zoloft 25 mg at bedtime- if side effects or problems- stop it and let me know (this is for anxiety) -- if anxiety worsens/ also stop it  Take zantac 150 mg twice daily for stomach - update me if no improvement  Follow up in 4-6 weeks

## 2011-12-04 NOTE — Assessment & Plan Note (Signed)
bp in fair control at this time  No changes needed  Disc lifstyle change with low sodium diet and exercise   

## 2011-12-04 NOTE — Assessment & Plan Note (Signed)
This is new- may be from recent constipation and meds for that  Disc diet Will start zantac 150 bid and update  Will continue until next appt 4-6 wk Update if not starting to improve in a week or if worsening

## 2011-12-04 NOTE — Assessment & Plan Note (Signed)
See assessment for anx Trial of zoloft low dose F/u 4-6 wk

## 2011-12-07 ENCOUNTER — Encounter: Payer: Self-pay | Admitting: *Deleted

## 2011-12-26 ENCOUNTER — Other Ambulatory Visit: Payer: Self-pay | Admitting: Family Medicine

## 2012-01-07 ENCOUNTER — Other Ambulatory Visit: Payer: Self-pay | Admitting: Family Medicine

## 2012-01-07 NOTE — Telephone Encounter (Signed)
Done

## 2012-01-08 ENCOUNTER — Ambulatory Visit: Payer: Medicare Other | Admitting: Family Medicine

## 2012-01-08 ENCOUNTER — Other Ambulatory Visit: Payer: Self-pay | Admitting: *Deleted

## 2012-01-08 MED ORDER — ZOLPIDEM TARTRATE 10 MG PO TABS: ORAL_TABLET | ORAL | Status: DC

## 2012-01-08 NOTE — Telephone Encounter (Signed)
Rx phoned in to pharmacy/[facility] 

## 2012-01-08 NOTE — Telephone Encounter (Signed)
Px written for call in   

## 2012-01-08 NOTE — Telephone Encounter (Signed)
Zolpidem request [last refill 01.29.13 #[id number]], Rx pending/[facility]. Please advise.

## 2012-01-19 ENCOUNTER — Ambulatory Visit (INDEPENDENT_AMBULATORY_CARE_PROVIDER_SITE_OTHER): Payer: Medicare Other | Admitting: Family Medicine

## 2012-01-19 ENCOUNTER — Encounter: Payer: Self-pay | Admitting: Family Medicine

## 2012-01-19 VITALS — BP 126/68 | HR 61 | Temp 97.2°F | Ht 63.0 in | Wt 186.8 lb

## 2012-01-19 DIAGNOSIS — K2970 Gastritis, unspecified, without bleeding: Secondary | ICD-10-CM

## 2012-01-19 DIAGNOSIS — K589 Irritable bowel syndrome without diarrhea: Secondary | ICD-10-CM

## 2012-01-19 DIAGNOSIS — F329 Major depressive disorder, single episode, unspecified: Secondary | ICD-10-CM

## 2012-01-19 MED ORDER — SERTRALINE HCL 50 MG PO TABS: 50.0000 mg | ORAL_TABLET | Freq: Every day | ORAL | Status: DC

## 2012-01-19 NOTE — Assessment & Plan Note (Signed)
With  Constipation predominant Will start regimen of citrucel and miralax-which has worked in past Rev last colonosc  Will update if not imp or if pain or other symptoms

## 2012-01-19 NOTE — Patient Instructions (Signed)
Increase zoloft to 50 mg once daily - if any side effects or problems let me know  Stop zantac  Start citurcel fiber supplement mixed with water once daily Try miralax once daily until regular bms - then decide how often you need it  Stay active and drink enough water Follow up in 3 months  If bowel problems worsen or do not improve before then please call

## 2012-01-19 NOTE — Assessment & Plan Note (Signed)
Improved on zoloft with improved anxiety and sleep also- very encouraging Tolerating med  Will inc to 50 mg daily-update if any problems Will call if side effects or problems Enc to stay active

## 2012-01-19 NOTE — Progress Notes (Signed)
Subjective:    Patient ID: Kimberly [name], female    DOB: February 21, 1928, 78 y.o.   MRN: [id number]  HPI Here for f/u of dep/ anx and also gastritis   Is having constipation again - and thinks that is causing much of her abdominal pain  Zantac did not help too much  No blood in stool  bm- sometimes small amounts every time she urinates   Pain - whole abdomen and bloated  No heartburn No belching Some flatus  No n/v  zoloft is doing well for her - mood is better and sleeping much better  Is interested in inc dose  Last colonosc ok   Fiber and miralax worked in past  Patient Active Problem List  Diagnosis  . COLONIC POLYPS  . GOITER  . HYPOTHYROIDISM  . HYPERLIPIDEMIA  . ANEMIA, MILD  . ANXIETY  . DISORDERS, ORGANIC INSOMNIA NOS  . HYPERTENSION  . CORONARY ARTERY DISEASE  . BUNDLE BRANCH BLOCK  . HEMORRHOIDS  . ALLERGIC RHINITIS  . GERD  . IRRITABLE BOWEL SYNDROME  . HYPERGLYCEMIA  . PERSONAL HX COLONIC POLYPS  . Dysuria  . Lower abdominal pain  . Frequent UTI  . Depression  . Heart palpitations  . Obesity  . Gastritis   Past Medical History  Diagnosis Date  . Allergy     allergic rhinitis  . CAD (coronary artery disease)   . GERD (gastroesophageal reflux disease)   . Hyperlipidemia   . Hypertension   . Hypothyroid   . Insomnia   . Asthma    Past Surgical History  Procedure Date  . Oophorectomy   . Thyroid surgery    History  Substance Use Topics  . Smoking status: Never Smoker   . Smokeless tobacco: Not on file  . Alcohol Use: No   Family History  Problem Relation Age of Onset  . Heart disease Mother   . Hypertension Mother    Allergies  Allergen Reactions  . Cephalexin     REACTION: rash and burning sensation to skin on arms and legs with itching.  . Ciprofloxacin     REACTION: burning of mouth and skin  . Elavil (Amitriptyline)     palpitation  . Lisinopril     REACTION: cough  . Penicillins     REACTION: rash  . Remeron (Mirtazapine)      constipation  . Sulfonamide Derivatives     REACTION: malaise  . Tetanus Toxoid     REACTION: swelling  . Vesicare (Solifenacin Succinate) Other (See Comments)    Blurred vision and weakness   Current Outpatient Prescriptions on File Prior to Visit  Medication Sig Dispense Refill  . acetaminophen (TYLENOL) 500 MG tablet OTC As directed.       Marland Kitchen amLODipine (NORVASC) 5 MG tablet TAKE 1 TABLET BY MOUTH ONCE A DAY  30 tablet  5  . aspirin 81 MG tablet Take 81 mg by mouth daily.        . cetirizine (ZYRTEC) 10 MG tablet 1 tablet by mouth daily as needed.       Marland Kitchen esomeprazole (NEXIUM) 40 MG capsule Take 1 capsule (40 mg total) by mouth daily.  90 capsule  3  . guaiFENesin (MUCINEX) 600 MG 12 hr tablet OTC as directed.       . hydrochlorothiazide (HYDRODIURIL) 25 MG tablet Take 0.5 tablets (12.5 mg total) by mouth daily.  90 tablet  3  . levothyroxine (SYNTHROID) 50 MCG tablet Take 1 tablet (50 mcg  total) by mouth daily.  30 tablet  11  . losartan (COZAAR) 100 MG tablet Take 1 tablet (100 mg total) by mouth daily.  90 tablet  3  . metoprolol (LOPRESSOR) 50 MG tablet TAKE 1 TABLET TWICE A DAY  180 tablet  3  . Multiple Vitamin (MULTIVITAMIN) capsule Take 1 capsule by mouth daily.        . ranitidine (ZANTAC) 150 MG tablet Take 1 tablet (150 mg total) by mouth 2 (two) times daily.  60 tablet  5  . sertraline (ZOLOFT) 25 MG tablet Take 1 tablet (25 mg total) by mouth daily.  30 tablet  3  . zolpidem (AMBIEN) 10 MG tablet 1/2 to 1 tablet by mouth at bedtime as needed.  30 tablet  3  . amLODipine (NORVASC) 5 MG tablet Take 5 mg by mouth daily.            Review of Systems Patient Active Problem List  Diagnosis  . COLONIC POLYPS  . GOITER  . HYPOTHYROIDISM  . HYPERLIPIDEMIA  . ANEMIA, MILD  . ANXIETY  . DISORDERS, ORGANIC INSOMNIA NOS  . HYPERTENSION  . CORONARY ARTERY DISEASE  . BUNDLE BRANCH BLOCK  . HEMORRHOIDS  . ALLERGIC RHINITIS  . GERD  . IRRITABLE BOWEL SYNDROME  .  HYPERGLYCEMIA  . PERSONAL HX COLONIC POLYPS  . Dysuria  . Lower abdominal pain  . Frequent UTI  . Depression  . Heart palpitations  . Obesity  . Gastritis   Past Medical History  Diagnosis Date  . Allergy     allergic rhinitis  . CAD (coronary artery disease)   . GERD (gastroesophageal reflux disease)   . Hyperlipidemia   . Hypertension   . Hypothyroid   . Insomnia   . Asthma    Past Surgical History  Procedure Date  . Oophorectomy   . Thyroid surgery    History  Substance Use Topics  . Smoking status: Never Smoker   . Smokeless tobacco: Not on file  . Alcohol Use: No   Family History  Problem Relation Age of Onset  . Heart disease Mother   . Hypertension Mother    Allergies  Allergen Reactions  . Cephalexin     REACTION: rash and burning sensation to skin on arms and legs with itching.  . Ciprofloxacin     REACTION: burning of mouth and skin  . Elavil (Amitriptyline)     palpitation  . Lisinopril     REACTION: cough  . Penicillins     REACTION: rash  . Remeron (Mirtazapine)     constipation  . Sulfonamide Derivatives     REACTION: malaise  . Tetanus Toxoid     REACTION: swelling  . Vesicare (Solifenacin Succinate) Other (See Comments)    Blurred vision and weakness   Current Outpatient Prescriptions on File Prior to Visit  Medication Sig Dispense Refill  . acetaminophen (TYLENOL) 500 MG tablet OTC As directed.       Marland Kitchen amLODipine (NORVASC) 5 MG tablet TAKE 1 TABLET BY MOUTH ONCE A DAY  30 tablet  5  . aspirin 81 MG tablet Take 81 mg by mouth daily.        . cetirizine (ZYRTEC) 10 MG tablet 1 tablet by mouth daily as needed.       Marland Kitchen esomeprazole (NEXIUM) 40 MG capsule Take 1 capsule (40 mg total) by mouth daily.  90 capsule  3  . guaiFENesin (MUCINEX) 600 MG 12 hr tablet OTC as directed.       Marland Kitchen  hydrochlorothiazide (HYDRODIURIL) 25 MG tablet Take 0.5 tablets (12.5 mg total) by mouth daily.  90 tablet  3  . levothyroxine (SYNTHROID) 50 MCG tablet Take  1 tablet (50 mcg total) by mouth daily.  30 tablet  11  . losartan (COZAAR) 100 MG tablet Take 1 tablet (100 mg total) by mouth daily.  90 tablet  3  . metoprolol (LOPRESSOR) 50 MG tablet TAKE 1 TABLET TWICE A DAY  180 tablet  3  . Multiple Vitamin (MULTIVITAMIN) capsule Take 1 capsule by mouth daily.        Marland Kitchen zolpidem (AMBIEN) 10 MG tablet 1/2 to 1 tablet by mouth at bedtime as needed.  30 tablet  3  . amLODipine (NORVASC) 5 MG tablet Take 5 mg by mouth daily.             Objective:   Physical Exam  Constitutional: She appears well-developed and well-nourished. No distress.  HENT:  Head: Normocephalic and atraumatic.  Mouth/Throat: Oropharynx is clear and moist.  Eyes: Conjunctivae and EOM are normal. Pupils are equal, round, and reactive to light. No scleral icterus.  Neck: Normal range of motion. Neck supple. No JVD present. No thyromegaly present.  Cardiovascular: Normal rate, regular rhythm, normal heart sounds and intact distal pulses.  Exam reveals no gallop.   Pulmonary/Chest: Effort normal and breath sounds normal. No respiratory distress. She has no wheezes.  Abdominal: Soft. Bowel sounds are normal. She exhibits no distension and no mass. There is no tenderness.       Mild tenderness bilat LQ today No rebound or gaurding   Musculoskeletal: Normal range of motion. She exhibits no edema and no tenderness.  Lymphadenopathy:    She has no cervical adenopathy.  Neurological: She is alert. She has normal reflexes. She displays no tremor. No cranial nerve deficit. She exhibits normal muscle tone. Coordination normal.  Skin: Skin is warm and dry. No rash noted.  Psychiatric: She has a normal mood and affect.       Improved affect More bright  Talkative  At ease  Not tearful    Review of Systems  Constitutional: Negative for fever, appetite change, fatigue and unexpected weight change.  Eyes: Negative for pain and visual disturbance.  Respiratory: Negative for cough and  shortness of breath.   Cardiovascular: Negative for cp or palpitations    Gastrointestinal: Negative for nausea, diarrhea and pos for constipation/ bloating , and incomplete bm, neg for blood in stool or dark stool  Genitourinary: Negative for urgency and frequency.  Skin: Negative for pallor or rash   Neurological: Negative for weakness, light-headedness, numbness and headaches.  Hematological: Negative for adenopathy. Does not bruise/bleed easily.  Psychiatric/Behavioral: pos for anxiety and depression that are quite improved so far on zolfot           Assessment & Plan:

## 2012-01-23 NOTE — Assessment & Plan Note (Signed)
Per pt epigastric pain is better, now c/o more of bloating/ constipation- IBS Will tx that and re eval if needed

## 2012-02-16 ENCOUNTER — Encounter: Payer: Self-pay | Admitting: Gastroenterology

## 2012-04-20 ENCOUNTER — Encounter: Payer: Self-pay | Admitting: Family Medicine

## 2012-04-20 ENCOUNTER — Ambulatory Visit (INDEPENDENT_AMBULATORY_CARE_PROVIDER_SITE_OTHER): Payer: Medicare Other | Admitting: Family Medicine

## 2012-04-20 VITALS — BP 136/64 | HR 66 | Temp 97.6°F | Ht 63.0 in | Wt 181.2 lb

## 2012-04-20 DIAGNOSIS — F329 Major depressive disorder, single episode, unspecified: Secondary | ICD-10-CM

## 2012-04-20 DIAGNOSIS — R7309 Other abnormal glucose: Secondary | ICD-10-CM

## 2012-04-20 DIAGNOSIS — E039 Hypothyroidism, unspecified: Secondary | ICD-10-CM

## 2012-04-20 DIAGNOSIS — I10 Essential (primary) hypertension: Secondary | ICD-10-CM

## 2012-04-20 DIAGNOSIS — E785 Hyperlipidemia, unspecified: Secondary | ICD-10-CM

## 2012-04-20 DIAGNOSIS — Z23 Encounter for immunization: Secondary | ICD-10-CM

## 2012-04-20 LAB — TSH: TSH: 1.19 u[IU]/mL (ref 0.35–5.50)

## 2012-04-20 LAB — HEMOGLOBIN A1C: Hgb A1c MFr Bld: 6.2 % (ref 4.6–6.5)

## 2012-04-20 NOTE — Assessment & Plan Note (Signed)
Has been intol to multiple meds Will stop zoloft for GI upset and update  Mood is stable Pt declines further tx at this time

## 2012-04-20 NOTE — Assessment & Plan Note (Signed)
Treated with lifestyle change  Disc low glycemic diet  a1c today

## 2012-04-20 NOTE — Assessment & Plan Note (Deleted)
Treated with diet / exercise-pt suspects not as good lately a1c today  Rev low glycemic diet

## 2012-04-20 NOTE — Assessment & Plan Note (Signed)
bp in fair control at this time  No changes needed  Disc lifstyle change with low sodium diet and exercise   

## 2012-04-20 NOTE — Progress Notes (Signed)
  Subjective:    Patient ID: Kimberly [name], female    DOB: 03-14-1928, 78 y.o.   MRN: [id number]  HPI Here for f/u of chronic medical problems  Mood On zoloft- thought it caused nausea - cut it down to 1/2 Feels worse on it --no imp in mood  Has not tol any meds so far Does not want to see a psychiatrist   Needs a flu shot   GI- gastritis/ IBS On nexium- thinks it helps, stomach stays aggrivated for 2-3 weeks  Is a little nauseated every day - ? From the zoloft   Hypothyroid No clinical changes  Lab Results  Component Value Date   TSH 0.92 12/04/2011    bp is stable today  No cp or palpitations or headaches or edema  No side effects to medicines  BP Readings from Last 3 Encounters:  04/20/12 136/64  01/19/12 126/68  12/04/11 136/62      Wt is dow 5 lb with bmi of 32 Is happy about that  Not a lot of exercise   Hyperglycemia -due for labs  Diet has not been good lately - eating too much bread (sandwhiches) ,less vegetables due to her GI trouble  Lab Results  Component Value Date   HGBA1C 6.4 08/18/2011      Review of Systems Review of Systems  Constitutional: Negative for fever, appetite change, fatigue and unexpected weight change.  Eyes: Negative for pain and visual disturbance.  Respiratory: Negative for cough and shortness of breath.   Cardiovascular: Negative for cp or palpitations    Gastrointestinal: Negative for  diarrhea and constipation. pos for nausea and epigastric discomfort Genitourinary: Negative for urgency and frequency.  Skin: Negative for pallor or rash   Neurological: Negative for weakness, light-headedness, numbness and headaches.  Hematological: Negative for adenopathy. Does not bruise/bleed easily.  Psychiatric/Behavioral: Negative for dysphoric mood. Pos for anxiety           Objective:   Physical Exam  Constitutional: She appears well-developed and well-nourished. No distress.  HENT:  Head: Normocephalic and atraumatic.  Mouth/Throat:  Oropharynx is clear and moist.  Eyes: Conjunctivae normal and EOM are normal. No scleral icterus.  Neck: Normal range of motion. Neck supple. No JVD present. Carotid bruit is not present. No thyromegaly present.  Cardiovascular: Normal rate, regular rhythm, normal heart sounds and intact distal pulses.  Exam reveals no gallop.   Pulmonary/Chest: Effort normal and breath sounds normal. No respiratory distress. She has no wheezes.  Abdominal: Soft. Bowel sounds are normal. She exhibits no distension, no abdominal bruit and no mass. There is no tenderness.  Musculoskeletal: Normal range of motion. She exhibits no edema and no tenderness.  Lymphadenopathy:    She has no cervical adenopathy.  Neurological: She is alert. She has normal reflexes. No cranial nerve deficit. She exhibits normal muscle tone. Coordination normal.  Skin: Skin is warm and dry. No rash noted. No erythema. No pallor.  Psychiatric: She has a normal mood and affect.       Did not seem overtly depressed or anxious today          Assessment & Plan:

## 2012-04-20 NOTE — Assessment & Plan Note (Signed)
Hypothyroidism  Pt has no clinical changes No change in energy level/ hair or skin/ edema and no tremor Lab Results  Component Value Date   TSH 0.92 12/04/2011    Draw tsh today

## 2012-04-20 NOTE — Patient Instructions (Addendum)
Flu shot today Labs today for sugar and thyroid  Stop the zoloft at the end of the week and then let me know if stomach symptoms do not improve

## 2012-05-06 ENCOUNTER — Other Ambulatory Visit: Payer: Self-pay | Admitting: Family Medicine

## 2012-05-09 NOTE — Telephone Encounter (Signed)
Ok to refill 

## 2012-05-09 NOTE — Telephone Encounter (Signed)
Px written for call in   

## 2012-05-09 NOTE — Telephone Encounter (Signed)
Rx called in as prescribed 

## 2012-06-08 ENCOUNTER — Other Ambulatory Visit: Payer: Self-pay | Admitting: Family Medicine

## 2012-07-08 ENCOUNTER — Other Ambulatory Visit: Payer: Self-pay | Admitting: Family Medicine

## 2012-07-18 ENCOUNTER — Other Ambulatory Visit: Payer: Self-pay

## 2012-07-18 MED ORDER — LOSARTAN POTASSIUM 100 MG PO TABS: 100.0000 mg | ORAL_TABLET | Freq: Every day | ORAL | Status: DC

## 2012-07-18 NOTE — Telephone Encounter (Signed)
Pt request refill losartan to [facility]. Advised refill done while pt on phone.

## 2012-08-02 ENCOUNTER — Ambulatory Visit (INDEPENDENT_AMBULATORY_CARE_PROVIDER_SITE_OTHER): Payer: Medicare Other | Admitting: Family Medicine

## 2012-08-02 ENCOUNTER — Encounter: Payer: Self-pay | Admitting: Family Medicine

## 2012-08-02 VITALS — BP 142/72 | HR 76 | Temp 98.0°F | Ht 63.0 in | Wt 183.0 lb

## 2012-08-02 DIAGNOSIS — E039 Hypothyroidism, unspecified: Secondary | ICD-10-CM

## 2012-08-02 DIAGNOSIS — E049 Nontoxic goiter, unspecified: Secondary | ICD-10-CM

## 2012-08-02 LAB — TSH: TSH: 0.31 u[IU]/mL — ABNORMAL LOW (ref 0.35–5.50)

## 2012-08-02 NOTE — Progress Notes (Signed)
  Subjective:    Patient ID: Kimberly [name], female    DOB: Jun 24, 1928, 78 y.o.   MRN: [id number]  HPI Here for thyroid issue  Feels like her thyroid is swelling- larger than it was  Worse for the past week  Some swelling of the L side of her neck also - is not sore    Lab Results  Component Value Date   TSH 1.19 04/20/2012    Saw Dr Juleen China in the past - does not see him any more  Has goiter Has had thyroid surgery -- took 75% of thyroid in past for nodule that was not cancerous  [place] 08- nodules and goiter = thinks she has had another one since then - ? [facility]  ? If she went back to Dr Dagoberto Ligas after that   Feels tired and jittery and her mouth and eyes are dry  Was loosing hair 2 mo ago - that leveled off however  Skin is very dry   Some stress - had a house problem with ducts/ heating   Wt is up 2 lb with bmi of 32   Review of Systems Review of Systems  Constitutional: Negative for fever, appetite change,  and unexpected weight change. pos for jitteriness Eyes: Negative for pain and visual disturbance.  Respiratory: Negative for cough and shortness of breath.   Cardiovascular: Negative for cp or palpitations    Gastrointestinal: Negative for nausea, diarrhea and constipation.  Genitourinary: Negative for urgency and frequency.  Skin: Negative for pallor or rash   Neurological: Negative for weakness, light-headedness, numbness and headaches.  Hematological: Negative for adenopathy. Does not bruise/bleed easily.  Psychiatric/Behavioral: Negative for dysphoric mood. The patient is nervous/anxious.         Objective:   Physical Exam  Constitutional: She appears well-developed and well-nourished. No distress.       obese and well appearing   Eyes: Conjunctivae normal and EOM are normal. Pupils are equal, round, and reactive to light. No scleral icterus.  Neck: Normal range of motion. Neck supple. Thyromegaly present.       L sided thyromegally  Surgical changes noted No  thyroid bruit  Cardiovascular: Normal rate, regular rhythm and normal heart sounds.   Pulmonary/Chest: Effort normal and breath sounds normal. No respiratory distress. She has no wheezes. She has no rales.  Musculoskeletal: She exhibits no edema and no tenderness.  Lymphadenopathy:    She has no cervical adenopathy.  Neurological: She is alert. She has normal reflexes. She displays no tremor.  Skin: Skin is warm and dry. No rash noted. No erythema. No pallor.  Psychiatric: She has a normal mood and affect.          Assessment & Plan:

## 2012-08-02 NOTE — Patient Instructions (Addendum)
Labs today for thyroid  We will schedule thyroid ultrasound at check out  We will make a plan based on results

## 2012-08-02 NOTE — Assessment & Plan Note (Signed)
Pt thinks the L side of her thyroid is enlarging (has had most of the R side removed in past )  Could not access last note from dr Juleen China or last [place] in epic today -files would not open up  sched [place]  Lab today Will likely then need to see endocrinology

## 2012-08-02 NOTE — Assessment & Plan Note (Signed)
Pt feels sluggish and jittery and thinks her goiter is bigger Lab and [place] ordered  Will likely need to see endocrine depending on result Hx of partial thyroidectomy for nodule in past

## 2012-08-04 ENCOUNTER — Telehealth: Payer: Self-pay | Admitting: Family Medicine

## 2012-08-04 ENCOUNTER — Ambulatory Visit
Admission: RE | Admit: 2012-08-04 | Discharge: 2012-08-04 | Disposition: A | Discharge status: Home / Self Care | Payer: Medicare Other | Source: Ambulatory Visit | Attending: Family Medicine | Admitting: Family Medicine

## 2012-08-04 DIAGNOSIS — E049 Nontoxic goiter, unspecified: Secondary | ICD-10-CM

## 2012-08-04 DIAGNOSIS — E039 Hypothyroidism, unspecified: Secondary | ICD-10-CM

## 2012-08-04 DIAGNOSIS — E041 Nontoxic single thyroid nodule: Secondary | ICD-10-CM | POA: Insufficient documentation

## 2012-08-04 MED ORDER — LEVOTHYROXINE SODIUM 25 MCG PO TABS: 25.0000 ug | ORAL_TABLET | Freq: Every day | ORAL | Status: DC

## 2012-08-04 NOTE — Telephone Encounter (Signed)
Pt notified of change of med, new Rx called in as prescribed and lab appt scheduled for 09/13/12

## 2012-08-04 NOTE — Telephone Encounter (Signed)
Need to decrease her synthroid dose  Px written for call in   Re check tsh in 6 weeks please

## 2012-08-04 NOTE — Telephone Encounter (Signed)
Ultrasound shows L thyroid nodule to have enlarged  I will refer to endocrinology She has seen Dr Juleen China in the past - unsure if she will want to return there or go to another practice

## 2012-08-04 NOTE — Telephone Encounter (Signed)
Left voicemail requesting pt to call office, will try to call  Back later

## 2012-08-05 NOTE — Telephone Encounter (Signed)
Let pt know [place] results and pt agrees with endocrinology referral, pt doesn't want to go back to Dr. Juleen China, pt will be at home for a while, I advise her that Shirlee Limerick will call her to set up referral

## 2012-08-08 NOTE — Telephone Encounter (Signed)
Appt made with Dr Ernest Haber on 08/09/12. MK

## 2012-08-09 ENCOUNTER — Encounter: Payer: Self-pay | Admitting: Internal Medicine

## 2012-08-09 ENCOUNTER — Ambulatory Visit (INDEPENDENT_AMBULATORY_CARE_PROVIDER_SITE_OTHER): Payer: Medicare Other | Admitting: Internal Medicine

## 2012-08-09 VITALS — BP 140/72 | HR 98 | Temp 98.3°F | Resp 16 | Ht 63.5 in | Wt 181.0 lb

## 2012-08-09 DIAGNOSIS — E049 Nontoxic goiter, unspecified: Secondary | ICD-10-CM

## 2012-08-09 DIAGNOSIS — E041 Nontoxic single thyroid nodule: Secondary | ICD-10-CM

## 2012-08-09 DIAGNOSIS — E039 Hypothyroidism, unspecified: Secondary | ICD-10-CM

## 2012-08-09 NOTE — Patient Instructions (Addendum)
Please return in a year. I will call you with the results of your biopsy.

## 2012-08-09 NOTE — Progress Notes (Addendum)
Subjective:     Patient ID: Kimberly [name], female   DOB: 05-23-1928, 79 y.o.   MRN: [id number]  HPI Kimberly [name] is a 79 y/o pleasant woman referred by PCP, Dr. Milinda Antis, for evaluation and treatment of Left thyroid nodule, enlarging.  She had 75% of her right thyroid lobe surgically removed 40 years ago for an enlarging nodule. She started to take thyroid medicine since then. She was very well controlled on the medication, and only had to have her daily dose changed twice in these 40 years. At last visit with Dr. Milinda Antis, she was taking 50 mcg of Synthroid per day, but TSH returned 0.31 (08/02/2012), so her Synthroid was decreased to 25 mcg daily.   She saw Dr. Ane Payment before 2008 and had a FNA >> benign (cannot remember the year), then she saw Dr. Juleen China around 2008. We reviewed together the images from the thyroid U/S from 04/2007 and from 08/2012: - in both: right thyroid lobe absent - in 2008: large left nodule 49 x 23 x 38 mm - "could represent a coalescence of several adjacent nodules", largest one 22 x 21 x 33 mm - in 2014: largest left nodule 52 x 24 x 42 mm, plus 2 hypoechoic nodules: one sup. 8 x 11.5 x 13 mm - with spongious aspect, and one 4.8 x 4.4 x 3.6 mm, hypoechoic. None of them have internal blood flow  Of note, the U/S report compared the 52 mm nodule from 2014 with the 22 mm nodule from 2008 (!) and reported significant growth.  She has fullness in her throat recently (in last few weeks), no dysphagia, mild hoarseness, feels lumps in left side of lower neck.   No FH of ThyCa and no h/o radiation tx.   She takes Nexium in am and Synthroid before lunch with the rest of her meds.  I reviewed her chart including office notes, telephone notes, labs, U/S images and reports, and available scanned records.  Review of Systems Constitutional: no weight gain/loss, + fatigue, decreased appetite, + subjective hyperthermia and hypothermia, poor sleep, nocturia Eyes: no blurry vision, no  xerophthalmia ENT: no sore throat, + L nodule palpated in throat, no dysphagia/odynophagia, + hoarseness Cardiovascular: no CP/SOB/palpitations/ + occasional leg swelling Respiratory: no cough/SOB Gastrointestinal: no V/D, + C, occasional N Musculoskeletal: + muscle, + joint aches, + joint swelling Skin: no rashes Neurological: no tremors/numbness/tingling/dizziness Psychiatric: no depression/ + anxiety  Past Medical History  Diagnosis Date  . Allergy     allergic rhinitis  . CAD (coronary artery disease)   . GERD (gastroesophageal reflux disease)   . Hyperlipidemia   . Hypertension   . Hypothyroid   . Insomnia    Past Surgical History  Procedure Date  . Oophorectomy   . Thyroid surgery    History   Social History  . Marital Status: Widowed    Number of Children: 3  Menopausal since 34 y/o.  Occupational History  . retired   Social History Main Topics  . Smoking status: Never Smoker   . Smokeless tobacco: No  . Alcohol Use: No  . Drug Use: No   Current Outpatient Prescriptions on File Prior to Visit  Medication Sig Dispense Refill  . acetaminophen (TYLENOL) 500 MG tablet OTC As directed.       Marland Kitchen amLODipine (NORVASC) 5 MG tablet TAKE 1 TABLET BY MOUTH ONCE A DAY  30 tablet  5  . aspirin 81 MG tablet Take 81 mg by mouth daily.        Marland Kitchen  cetirizine (ZYRTEC) 10 MG tablet 1 tablet by mouth daily as needed.       Marland Kitchen guaiFENesin (MUCINEX) 600 MG 12 hr tablet OTC as directed.       . hydrochlorothiazide (HYDRODIURIL) 25 MG tablet TAKE 1/2 TABLET (=12.5MG )  DAILY  45 tablet  1  . levothyroxine (LEVOTHROID) 25 MCG tablet Take 1 tablet (25 mcg total) by mouth daily.  30 tablet  3  . losartan (COZAAR) 100 MG tablet Take 1 tablet (100 mg total) by mouth daily.  90 tablet  1  . metoprolol (LOPRESSOR) 50 MG tablet TAKE 1 TABLET TWICE A DAY  180 tablet  3  . Multiple Vitamin (MULTIVITAMIN) capsule Take 1 capsule by mouth daily.        Marland Kitchen NEXIUM 40 MG capsule TAKE 1 CAPSULE DAILY   90 each  1  . zolpidem (AMBIEN) 10 MG tablet TAKE 1/2 TO 1 TABLET BY MOUTH AT BEDTIME AS NEEDED  30 tablet  5   Allergies  Allergen Reactions  . Cephalexin     REACTION: rash and burning sensation to skin on arms and legs with itching.  . Ciprofloxacin     REACTION: burning of mouth and skin  . Elavil (Amitriptyline)     palpitation  . Lisinopril     REACTION: cough  . Penicillins     REACTION: rash  . Remeron (Mirtazapine)     constipation  . Sulfonamide Derivatives     REACTION: malaise  . Tetanus Toxoid     REACTION: swelling  . Vesicare (Solifenacin Succinate) Other (See Comments)    Blurred vision and weakness  . Zoloft (Sertraline Hcl)     GI upset and nausea   Family History  Problem Relation Age of Onset  . Heart disease Mother   . Hypertension Mother   No FH of ThyCa  Objective:   Physical Exam BP 140/72  Pulse 98  Temp 98.3 F (36.8 C) (Oral)  Resp 16  Ht 5' 3.5" (1.613 m)  Wt 181 lb (82.101 kg)  BMI 31.56 kg/m2  SpO2 98%  LMP 08/03/1978  Wt Readings from Last 3 Encounters:  08/09/12 181 lb (82.101 kg)  08/02/12 183 lb (83.008 kg)  04/20/12 181 lb 4 oz (82.214 kg)  Constitutional: overweight, in NAD Eyes: PERRLA, EOMI, no exophthalmos ENT: moist mucous membranes, L thyromegaly - smooth at palpation, mobile with swallowing, no cervical lymphadenopathy Cardiovascular: RRR, No MRG, bilat. periankle edema, non-pitting Respiratory: CTA B Gastrointestinal: abdomen soft, NT, ND, BS+ Musculoskeletal: no deformities, strength intact in all 4 Skin: moist, warm, no rashes Neurological: no tremor with outstretched hands, DTR normal in all 4     Assessment:     1. Left thyroid nodule, ?enlarging - subjective neck compression spx  Thyroid U/S from 04/2007 compared to U/S from 08/2012: - in both: right thyroid lobe absent - in 2008: large left nodule 49 x 23 x 38 mm - "could represent a coalescence of several adjacent nodules", largest one 22 x 21 x 33  mm - in 2014: largest left nodule 52 x 24 x 42 mm, plus 2 hypoechoic nodules: one sup. 8 x 11.5 x 13 mm - with spongious aspect, and one 4.8 x 4.4 x 3.6 mm, hypoechoic. None of them have internal blood flow  Of note, the U/S report compared the 52 mm nodule from 2014 with the 22 mm nodule from 2008 (!) and reported significant growth.  2. S/p R thyroidectomy for MNG, enlarging - postsurgical  mild hypothyroidism    Plan:   1. Left thyroid nodule - we reviewed the images for the left dominant nodule from 2008 and 2014 side by side. I believe the U/S report from 2014 compared the whole coalescence of nodules to only a part of it that was measured separately in 2008. If the images of the large coalescent nodule are put side by side, they are not very different in size.  - we discussed the fact that the main concern is for thyroid cancer. The fact that she is a woman and the fact that she did not have radiation tx to her head and neck or FH of thyroid cancer confers her a lower cancer risk, but the nodule is large and she is 79 y/o, so I think it deserves another FNA, which we will schedule for her.  - even if this is cancerous, thyroid cancer is usually very indolent and will impact her life in 10-20 years. However, she is a healthy 79 y/o and she is bothered by the nodule, so I suggested that she had an FNA and then will decide further plan based on the results.  - if this is benign, I will see her in a year and will do an U/S then if signs of growth, if not, in 2 years. If the nodule started to affect her swallowing she will let me know and might need to do thyroidectomy. - if this is malignant, I would refer her to Sx for completion thyroidectomy - if it is indeterminate, would likely repeat FNA in 6 mo-1 year - Pt agrees to plan - I will call her with the results of the biopsy - I also advised her to start taking the Synthroid in am on an empty stomach, separated by b'fast by >30 min and take Nexium  and MVI at night - RTC in 1 year  FNA result:  Adequacy Reason Satisfactory For Evaluation.  Diagnosis THYROID, FINE NEEDLE ASPIRATION, [facility]  BENIGN THYROID NODULE WITH CYSTIC CHANGE SEE COMMENT COMMENT: THERE ARE ABUNDANT STRIPPED BANAL FOLLICULAR CELL NUCLEI AND LYMPHOCYTES PRESENT. THE FINDINGS MAY REPRESENT LYMPHOCYTIC THYROIDITIS, BUT THECYTOPATHOLOGY IS NOT DIAGNOSTIC. THERE ARE NO FEATURES OF MALIGNANCY PRESENT. THE CASE WAS REVIEWED WITH DR Raynald Blend, WHO CONCURS.  [place] RUND DO Pathologist, Electronic Signature (Case signed 08/17/2012)  Called and informed pt. Advised to try to take Ibuprofen for her congestion. RTC in 1 year, or will call me with compression spx in the interim.

## 2012-08-16 ENCOUNTER — Ambulatory Visit
Admission: RE | Admit: 2012-08-16 | Discharge: 2012-08-16 | Disposition: A | Discharge status: Home / Self Care | Payer: Medicare Other | Source: Ambulatory Visit | Attending: Internal Medicine | Admitting: Internal Medicine

## 2012-08-16 ENCOUNTER — Other Ambulatory Visit (HOSPITAL_COMMUNITY)
Admission: RE | Admit: 2012-08-16 | Discharge: 2012-08-16 | Disposition: A | Discharge status: Home / Self Care | Payer: Medicare Other | Source: Ambulatory Visit | Attending: Interventional Radiology | Admitting: Interventional Radiology

## 2012-08-16 DIAGNOSIS — E041 Nontoxic single thyroid nodule: Secondary | ICD-10-CM | POA: Insufficient documentation

## 2012-08-18 ENCOUNTER — Encounter: Payer: Self-pay | Admitting: Internal Medicine

## 2012-09-13 ENCOUNTER — Other Ambulatory Visit: Payer: Medicare Other

## 2012-09-20 ENCOUNTER — Other Ambulatory Visit (INDEPENDENT_AMBULATORY_CARE_PROVIDER_SITE_OTHER): Payer: Medicare Other

## 2012-09-20 DIAGNOSIS — E039 Hypothyroidism, unspecified: Secondary | ICD-10-CM

## 2012-09-20 LAB — TSH: TSH: 1.4 u[IU]/mL (ref 0.35–5.50)

## 2012-09-21 ENCOUNTER — Encounter: Payer: Self-pay | Admitting: *Deleted

## 2012-10-28 ENCOUNTER — Other Ambulatory Visit: Payer: Self-pay | Admitting: Family Medicine

## 2012-10-28 NOTE — Telephone Encounter (Signed)
Px written for call in   

## 2012-10-28 NOTE — Telephone Encounter (Signed)
Ok to refill 

## 2012-10-28 NOTE — Telephone Encounter (Signed)
Rx called in as prescribed 

## 2012-12-12 ENCOUNTER — Other Ambulatory Visit: Payer: Self-pay | Admitting: Family Medicine

## 2012-12-13 ENCOUNTER — Ambulatory Visit (INDEPENDENT_AMBULATORY_CARE_PROVIDER_SITE_OTHER): Payer: Medicare Other | Admitting: Family Medicine

## 2012-12-13 ENCOUNTER — Encounter: Payer: Self-pay | Admitting: Family Medicine

## 2012-12-13 VITALS — BP 148/88 | HR 74 | Temp 98.2°F | Ht 63.5 in | Wt 186.0 lb

## 2012-12-13 DIAGNOSIS — N390 Urinary tract infection, site not specified: Secondary | ICD-10-CM

## 2012-12-13 DIAGNOSIS — R300 Dysuria: Secondary | ICD-10-CM

## 2012-12-13 LAB — POCT URINALYSIS DIPSTICK
Nitrite, UA: NEGATIVE
Urobilinogen, UA: 0.2
pH, UA: 6.5

## 2012-12-13 LAB — POCT UA - MICROSCOPIC ONLY
Casts, Ur, LPF, POC: 0
Crystals, Ur, HPF, POC: 0
Yeast, UA: 0

## 2012-12-13 MED ORDER — NITROFURANTOIN MONOHYD MACRO 100 MG PO CAPS: 100.0000 mg | ORAL_CAPSULE | Freq: Two times a day (BID) | ORAL | Status: DC

## 2012-12-13 NOTE — Progress Notes (Signed)
Subjective:    Patient ID: Kimberly [name], female    DOB: 08/28/1927, 79 y.o.   MRN: [id number]  HPI Has hx of frequent utis  About 2-3 weeks with this episode Pain over bladder area and in low back  Burning to urinate  Some frequency and great urgency - occ leaking but not much No blood in urine Urine smells badly  Sometimes feels feverish No flank pain   Patient Active Problem List   Diagnosis Date Noted  . UTI (urinary tract infection) 12/13/2012  . Left thyroid nodule 08/04/2012  . Obesity 12/04/2011  . Gastritis 12/04/2011  . Heart palpitations 09/01/2011  . Depression 05/26/2011  . Frequent UTI 12/30/2010  . Lower abdominal pain 11/27/2010  . Dysuria 11/26/2010  . COLONIC POLYPS 04/09/2010  . ANEMIA, MILD 04/09/2010  . HEMORRHOIDS 03/04/2010  . ANXIETY 12/10/2009  . PERSONAL HX COLONIC POLYPS 06/19/2008  . IRRITABLE BOWEL SYNDROME 03/28/2008  . 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 GOITER 03/29/2007  . HYPOTHYROIDISM 03/29/2007  . HYPERLIPIDEMIA 03/29/2007  . HYPERTENSION 03/29/2007  . CORONARY ARTERY DISEASE 03/29/2007  . BUNDLE BRANCH BLOCK 03/29/2007  . ALLERGIC RHINITIS 03/29/2007  . GERD 03/29/2007  . HYPERGLYCEMIA 03/29/2007   Past Medical History  Diagnosis Date  . Allergy     allergic rhinitis  . CAD (coronary artery disease)   . GERD (gastroesophageal reflux disease)   . Hyperlipidemia   . Hypertension   . Hypothyroid   . Insomnia    Past Surgical History  Procedure Laterality Date  . Oophorectomy    . Thyroid surgery     History  Substance Use Topics  . Smoking status: Never Smoker   . Smokeless tobacco: Not on file  . Alcohol Use: No   Family History  Problem Relation Age of Onset  . Heart disease Mother   . Hypertension Mother    Allergies  Allergen Reactions  . Cephalexin     REACTION: rash and burning sensation to skin on arms and legs with itching.  . Ciprofloxacin     REACTION: burning of mouth and skin  . Elavil  (Amitriptyline)     palpitation  . Lisinopril     REACTION: cough  . Penicillins     REACTION: rash  . Remeron (Mirtazapine)     constipation  . Sulfonamide Derivatives     REACTION: malaise  . Tetanus Toxoid     REACTION: swelling  . Vesicare (Solifenacin Succinate) Other (See Comments)    Blurred vision and weakness  . Zoloft (Sertraline Hcl)     GI upset and nausea   Current Outpatient Prescriptions on File Prior to Visit  Medication Sig Dispense Refill  . acetaminophen (TYLENOL) 500 MG tablet OTC As directed.       Marland Kitchen amLODipine (NORVASC) 5 MG tablet TAKE 1 TABLET BY MOUTH ONCE A DAY  30 tablet  5  . aspirin 81 MG tablet Take 81 mg by mouth daily.        . cetirizine (ZYRTEC) 10 MG tablet 1 tablet by mouth daily as needed.       Marland Kitchen guaiFENesin (MUCINEX) 600 MG 12 hr tablet OTC as directed.       . hydrochlorothiazide (HYDRODIURIL) 25 MG tablet TAKE 1/2 TABLET (=12.5MG )  DAILY  45 tablet  1  . levothyroxine (SYNTHROID, LEVOTHROID) 25 MCG tablet TAKE 1 TABLET BY MOUTH ONCE A DAY  30 tablet  7  . losartan (COZAAR) 100 MG tablet Take 1 tablet (100  mg total) by mouth daily.  90 tablet  1  . metoprolol (LOPRESSOR) 50 MG tablet TAKE 1 TABLET TWICE A DAY  180 tablet  3  . Multiple Vitamin (MULTIVITAMIN) capsule Take 1 capsule by mouth daily.        Marland Kitchen NEXIUM 40 MG capsule TAKE 1 CAPSULE DAILY  90 each  1  . zolpidem (AMBIEN) 10 MG tablet TAKE 1/2-1 TABLET BY MOUTH AT BEDTIME AS NEEDED  30 tablet  5   No current facility-administered medications on file prior to visit.    Review of Systems Review of Systems  Constitutional: Negative for fever, appetite change, fatigue and unexpected weight change.  Eyes: Negative for pain and visual disturbance.  Respiratory: Negative for cough and shortness of breath.   Cardiovascular: Negative for cp or palpitations    Gastrointestinal: Negative for nausea, diarrhea and constipation.  Genitourinary: pos for urgency and frequency. neg for flank pain   Skin: Negative for pallor or rash   Neurological: Negative for weakness, light-headedness, numbness and headaches.  Hematological: Negative for adenopathy. Does not bruise/bleed easily.  Psychiatric/Behavioral: Negative for dysphoric mood. The patient is not nervous/anxious.         Objective:   Physical Exam  Constitutional: She appears well-developed and well-nourished. No distress.  obese and well appearing   HENT:  Head: Normocephalic and atraumatic.  Eyes: Conjunctivae and EOM are normal. Pupils are equal, round, and reactive to light.  Neck: Normal range of motion. Neck supple.  Cardiovascular: Normal rate and regular rhythm.   Pulmonary/Chest: Effort normal and breath sounds normal.  Abdominal: Soft. Bowel sounds are normal. She exhibits no distension and no mass. There is tenderness. There is no rebound and no guarding.  Mild suprapubic tenderness No cva tenderness   Neurological: She is alert.  Skin: Skin is warm and dry. No rash noted.  Psychiatric: She has a normal mood and affect.          Assessment & Plan:

## 2012-12-13 NOTE — Assessment & Plan Note (Signed)
Prev had frequent utis - this had slowed down  ua pos -sent for cx  tx with macrobid (had prev been on this for prophylaxis- will use caution for dizziness) Is intol/ all to many other abx Urged to inc water intake  Update if worse or no imp

## 2012-12-13 NOTE — Patient Instructions (Addendum)
You have a uti  Take the antibiotic as ordered - be careful of dizziness or other side effects and keep me posted Update me if symptoms do not improve We will call you with urine culture result when it returns

## 2012-12-15 LAB — URINE CULTURE: Colony Count: 80000

## 2012-12-19 ENCOUNTER — Other Ambulatory Visit: Payer: Self-pay | Admitting: Family Medicine

## 2012-12-19 NOTE — Telephone Encounter (Signed)
Electronic refill request, ok to refill? 

## 2012-12-19 NOTE — Telephone Encounter (Signed)
Please see results notes regarding pt's symptoms

## 2012-12-19 NOTE — Telephone Encounter (Signed)
Pt notified abx refilled one time and to f/u if no improvement after 2nd round of abx

## 2012-12-19 NOTE — Telephone Encounter (Signed)
Noted- symptoms are improved but not gone - please refil times one and f/u if still not resolved, thanks

## 2012-12-20 ENCOUNTER — Other Ambulatory Visit: Payer: Self-pay | Admitting: Family Medicine

## 2013-01-11 ENCOUNTER — Other Ambulatory Visit: Payer: Self-pay

## 2013-01-11 MED ORDER — LOSARTAN POTASSIUM 100 MG PO TABS: 100.0000 mg | ORAL_TABLET | Freq: Every day | ORAL | Status: DC

## 2013-01-11 NOTE — Telephone Encounter (Signed)
Kimberly [name] left v/m requesting refill Losartan to [facility]; request call pt when refilled. Pt notified refill done and pt will call for appt for f/u prior to needing another refill.

## 2013-01-13 ENCOUNTER — Other Ambulatory Visit: Payer: Self-pay | Admitting: Family Medicine

## 2013-03-20 ENCOUNTER — Encounter: Payer: Self-pay | Admitting: Radiology

## 2013-03-21 ENCOUNTER — Ambulatory Visit (INDEPENDENT_AMBULATORY_CARE_PROVIDER_SITE_OTHER): Payer: Medicare Other | Admitting: Family Medicine

## 2013-03-21 ENCOUNTER — Encounter: Payer: Self-pay | Admitting: Family Medicine

## 2013-03-21 VITALS — BP 124/80 | HR 76 | Temp 98.4°F | Ht 63.5 in | Wt 184.5 lb

## 2013-03-21 DIAGNOSIS — I10 Essential (primary) hypertension: Secondary | ICD-10-CM

## 2013-03-21 DIAGNOSIS — B370 Candidal stomatitis: Secondary | ICD-10-CM

## 2013-03-21 DIAGNOSIS — J309 Allergic rhinitis, unspecified: Secondary | ICD-10-CM

## 2013-03-21 MED ORDER — FLUTICASONE PROPIONATE 50 MCG/ACT NA SUSP: 2.0000 | Freq: Every day | NASAL | Status: DC

## 2013-03-21 MED ORDER — LOSARTAN POTASSIUM 100 MG PO TABS: 100.0000 mg | ORAL_TABLET | Freq: Every day | ORAL | Status: DC

## 2013-03-21 MED ORDER — NYSTATIN 100000 UNIT/ML MT SUSP: OROMUCOSAL | Status: DC

## 2013-03-21 NOTE — Assessment & Plan Note (Signed)
Worse lately with congestion/ sneezing/ rhinorrhea and sinus pressure No fever or purulent nasal drainage  Will continue zyrtec Add flonase ns-inst in how to use  Update if not starting to improve in a week or if worsening  -esp if facial pain or fever

## 2013-03-21 NOTE — Assessment & Plan Note (Signed)
With burning in mouth/ bad taste and non scrapable coating  tx with nystatin mouthwash tid  Update if not starting to improve in a week or if worsening

## 2013-03-21 NOTE — Progress Notes (Signed)
Subjective:    Patient ID: Kimberly [name], female    DOB: February 03, 1928, 79 y.o.   MRN: [id number]  HPI Here with sinus symptoms  Also mouth and throat burn  Nasal stuffiness / and nose burns with a little burning  Clear nasal discharge  Not much runniness  Ears feel stopped up - whole head feels pressure Pain in face (pressure too) around eyes  Taking zyrtec Taking mucinex  A little night time cough - phlegm from her throat- also clear   Thinks this is allergies   Needs to leave urine screen/tox today-she takes her ambien at night  Needs refill for losartan bp is stable today  No cp or palpitations or headaches or edema  No side effects to medicines  BP Readings from Last 3 Encounters:  03/21/13 124/80  12/13/12 148/88  08/09/12 140/72       Patient Active Problem List   Diagnosis Date Noted  . UTI (urinary tract infection) 12/13/2012  . Left thyroid nodule 08/04/2012  . Obesity 12/04/2011  . Gastritis 12/04/2011  . Heart palpitations 09/01/2011  . Depression 05/26/2011  . Frequent UTI 12/30/2010  . Lower abdominal pain 11/27/2010  . Dysuria 11/26/2010  . COLONIC POLYPS 04/09/2010  . ANEMIA, MILD 04/09/2010  . HEMORRHOIDS 03/04/2010  . ANXIETY 12/10/2009  . PERSONAL HX COLONIC POLYPS 06/19/2008  . IRRITABLE BOWEL SYNDROME 03/28/2008  . 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 GOITER 03/29/2007  . HYPOTHYROIDISM 03/29/2007  . HYPERLIPIDEMIA 03/29/2007  . HYPERTENSION 03/29/2007  . CORONARY ARTERY DISEASE 03/29/2007  . BUNDLE BRANCH BLOCK 03/29/2007  . ALLERGIC RHINITIS 03/29/2007  . GERD 03/29/2007  . HYPERGLYCEMIA 03/29/2007   Past Medical History  Diagnosis Date  . Allergy     allergic rhinitis  . CAD (coronary artery disease)   . GERD (gastroesophageal reflux disease)   . Hyperlipidemia   . Hypertension   . Hypothyroid   . Insomnia    Past Surgical History  Procedure Laterality Date  . Oophorectomy    . Thyroid surgery     History   Substance Use Topics  . Smoking status: Never Smoker   . Smokeless tobacco: Not on file  . Alcohol Use: No   Family History  Problem Relation Age of Onset  . Heart disease Mother   . Hypertension Mother    Allergies  Allergen Reactions  . Cephalexin     REACTION: rash and burning sensation to skin on arms and legs with itching.  . Ciprofloxacin     REACTION: burning of mouth and skin  . Elavil [Amitriptyline]     palpitation  . Lisinopril     REACTION: cough  . Penicillins     REACTION: rash  . Remeron [Mirtazapine]     constipation  . Sulfonamide Derivatives     REACTION: malaise  . Tetanus Toxoid     REACTION: swelling  . Vesicare [Solifenacin Succinate] Other (See Comments)    Blurred vision and weakness  . Zoloft [Sertraline Hcl]     GI upset and nausea   Current Outpatient Prescriptions on File Prior to Visit  Medication Sig Dispense Refill  . acetaminophen (TYLENOL) 500 MG tablet OTC As directed.       Marland Kitchen amLODipine (NORVASC) 5 MG tablet TAKE 1 TABLET BY MOUTH ONCE A DAY  30 tablet  3  . aspirin 81 MG tablet Take 81 mg by mouth daily.        . cetirizine (ZYRTEC) 10 MG tablet 1 tablet  by mouth daily as needed.       Marland Kitchen guaiFENesin (MUCINEX) 600 MG 12 hr tablet OTC as directed.       . hydrochlorothiazide (HYDRODIURIL) 25 MG tablet TAKE 1/2 TABLET (=12.5MG )  DAILY  45 tablet  1  . levothyroxine (SYNTHROID, LEVOTHROID) 25 MCG tablet TAKE 1 TABLET BY MOUTH ONCE A DAY  30 tablet  7  . losartan (COZAAR) 100 MG tablet Take 1 tablet (100 mg total) by mouth daily.  90 tablet  0  . metoprolol (LOPRESSOR) 50 MG tablet TAKE 1 TABLET TWICE A DAY  180 tablet  1  . Multiple Vitamin (MULTIVITAMIN) capsule Take 1 capsule by mouth daily.        Marland Kitchen NEXIUM 40 MG capsule TAKE 1 CAPSULE DAILY  90 capsule  1  . zolpidem (AMBIEN) 10 MG tablet TAKE 1/2-1 TABLET BY MOUTH AT BEDTIME AS NEEDED  30 tablet  5   No current facility-administered medications on file prior to visit.       Review of Systems Review of Systems  Constitutional: Negative for fever, appetite change, fatigue and unexpected weight change.  Eyes: Negative for pain and visual disturbance.  ENt pos for facial pressure and ear pressure , neg for purulent drainage  Respiratory: Negative for shortness of breath. Neg for wheeze   Cardiovascular: Negative for cp or palpitations    Gastrointestinal: Negative for nausea, diarrhea and constipation.  Genitourinary: Negative for urgency and frequency.  Skin: Negative for pallor or rash  neg for itching  Neurological: Negative for weakness, light-headedness, numbness and headaches.  Hematological: Negative for adenopathy. Does not bruise/bleed easily.  Psychiatric/Behavioral: Negative for dysphoric mood. The patient is not nervous/anxious.         Objective:   Physical Exam  Constitutional: She appears well-developed and well-nourished. No distress.  HENT:  Head: Normocephalic and atraumatic.  Right Ear: External ear normal.  Left Ear: External ear normal.  Mouth/Throat: Oropharynx is clear and moist. No oropharyngeal exudate.  Nares are injected and congested /boggy with clear rhinorrhea  Clear post nasal drip  White non scrap able coating on tongue No sinus tenderness   Eyes: Conjunctivae and EOM are normal. Pupils are equal, round, and reactive to light. Right eye exhibits no discharge. Left eye exhibits no discharge. No scleral icterus.  Neck: Normal range of motion. Neck supple.  Cardiovascular: Normal rate and regular rhythm.   Pulmonary/Chest: Effort normal and breath sounds normal. No respiratory distress. She has no wheezes. She has no rales.  Lymphadenopathy:    She has no cervical adenopathy.  Neurological: She is alert. No cranial nerve deficit.  Skin: Skin is warm and dry. No rash noted.  Psychiatric: She has a normal mood and affect.          Assessment & Plan:

## 2013-03-21 NOTE — Assessment & Plan Note (Signed)
bp in fair control at this time  No changes needed  Disc lifstyle change with low sodium diet and exercise  Last labs reviewed Losartan refilled today

## 2013-03-21 NOTE — Patient Instructions (Addendum)
Continue zyrtec for allergies Add flonase nasal spray once daily  If no improvement in 1-2 weeks or if worse or fever please let me know  For thrush - use the nystatin mouthwash as directed

## 2013-04-10 ENCOUNTER — Encounter: Payer: Self-pay | Admitting: Family Medicine

## 2013-04-23 ENCOUNTER — Other Ambulatory Visit: Payer: Self-pay | Admitting: Family Medicine

## 2013-04-24 NOTE — Telephone Encounter (Signed)
Rx called in as prescribed 

## 2013-04-24 NOTE — Telephone Encounter (Signed)
Px written for call in   

## 2013-05-12 ENCOUNTER — Other Ambulatory Visit: Payer: Self-pay | Admitting: Family Medicine

## 2013-05-18 ENCOUNTER — Ambulatory Visit: Payer: Medicare Other | Admitting: Family Medicine

## 2013-05-19 ENCOUNTER — Encounter: Payer: Self-pay | Admitting: Family Medicine

## 2013-05-19 ENCOUNTER — Ambulatory Visit (INDEPENDENT_AMBULATORY_CARE_PROVIDER_SITE_OTHER): Payer: Medicare Other | Admitting: Family Medicine

## 2013-05-19 VITALS — BP 128/74 | HR 71 | Temp 98.5°F | Ht 63.5 in | Wt 186.8 lb

## 2013-05-19 DIAGNOSIS — J209 Acute bronchitis, unspecified: Secondary | ICD-10-CM

## 2013-05-19 MED ORDER — AZITHROMYCIN 250 MG PO TABS: ORAL_TABLET | ORAL | Status: DC

## 2013-05-19 MED ORDER — GUAIFENESIN-CODEINE 100-10 MG/5ML PO SYRP: 5.0000 mL | ORAL_SOLUTION | Freq: Four times a day (QID) | ORAL | Status: DC | PRN

## 2013-05-19 NOTE — Progress Notes (Signed)
Subjective:    Patient ID: Kimberly [name], female    DOB: September 18, 1927, 79 y.o.   MRN: [id number]  HPI Here with uri symptoms  Started about 3 weeks ago  Cough and rib soreness   Now is coughing up mucous - is usually clear / a little yellow at times  No wheeze or sob  Hoarse Nasal stuffiness  ST from cough No ear pain  No facial pain   No fever or chills    Patient Active Problem List   Diagnosis Date Noted  . Thrush, oral 03/21/2013  . UTI (urinary tract infection) 12/13/2012  . Left thyroid nodule 08/04/2012  . Obesity 12/04/2011  . Gastritis 12/04/2011  . Heart palpitations 09/01/2011  . Depression 05/26/2011  . Frequent UTI 12/30/2010  . Lower abdominal pain 11/27/2010  . Dysuria 11/26/2010  . COLONIC POLYPS 04/09/2010  . ANEMIA, MILD 04/09/2010  . HEMORRHOIDS 03/04/2010  . ANXIETY 12/10/2009  . PERSONAL HX COLONIC POLYPS 06/19/2008  . IRRITABLE BOWEL SYNDROME 03/28/2008  . 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 GOITER 03/29/2007  . HYPOTHYROIDISM 03/29/2007  . HYPERLIPIDEMIA 03/29/2007  . HYPERTENSION 03/29/2007  . CORONARY ARTERY DISEASE 03/29/2007  . BUNDLE BRANCH BLOCK 03/29/2007  . ALLERGIC RHINITIS 03/29/2007  . GERD 03/29/2007  . HYPERGLYCEMIA 03/29/2007   Past Medical History  Diagnosis Date  . Allergy     allergic rhinitis  . CAD (coronary artery disease)   . GERD (gastroesophageal reflux disease)   . Hyperlipidemia   . Hypertension   . Hypothyroid   . Insomnia    Past Surgical History  Procedure Laterality Date  . Oophorectomy    . Thyroid surgery     History  Substance Use Topics  . Smoking status: Never Smoker   . Smokeless tobacco: Not on file  . Alcohol Use: No   Family History  Problem Relation Age of Onset  . Heart disease Mother   . Hypertension Mother    Allergies  Allergen Reactions  . Cephalexin     REACTION: rash and burning sensation to skin on arms and legs with itching.  . Ciprofloxacin     REACTION:  burning of mouth and skin  . Elavil [Amitriptyline]     palpitation  . Lisinopril     REACTION: cough  . Penicillins     REACTION: rash  . Remeron [Mirtazapine]     constipation  . Sulfonamide Derivatives     REACTION: malaise  . Tetanus Toxoid     REACTION: swelling  . Vesicare [Solifenacin Succinate] Other (See Comments)    Blurred vision and weakness  . Zoloft [Sertraline Hcl]     GI upset and nausea   Current Outpatient Prescriptions on File Prior to Visit  Medication Sig Dispense Refill  . acetaminophen (TYLENOL) 500 MG tablet OTC As directed.       Marland Kitchen amLODipine (NORVASC) 5 MG tablet TAKE 1 TABLET BY MOUTH ONCE A DAY  30 tablet  3  . aspirin 81 MG tablet Take 81 mg by mouth daily.        . cetirizine (ZYRTEC) 10 MG tablet 1 tablet by mouth daily as needed.       . fluticasone (FLONASE) 50 MCG/ACT nasal spray Place 2 sprays into the nose daily.  16 g  11  . guaiFENesin (MUCINEX) 600 MG 12 hr tablet OTC as directed.       . hydrochlorothiazide (HYDRODIURIL) 25 MG tablet TAKE 1/2 TABLET (=12.5MG )  DAILY  45 tablet  1  . levothyroxine (SYNTHROID, LEVOTHROID) 25 MCG tablet TAKE 1 TABLET BY MOUTH ONCE A DAY  30 tablet  7  . losartan (COZAAR) 100 MG tablet Take 1 tablet (100 mg total) by mouth daily.  90 tablet  3  . metoprolol (LOPRESSOR) 50 MG tablet TAKE 1 TABLET TWICE A DAY  180 tablet  1  . Multiple Vitamin (MULTIVITAMIN) capsule Take 1 capsule by mouth daily.        Marland Kitchen NEXIUM 40 MG capsule TAKE 1 CAPSULE DAILY  90 capsule  1  . nystatin (MYCOSTATIN) 100000 UNIT/ML suspension Swish and swallow 1 teaspoon three times daily for a week for thrush  60 mL  0  . zolpidem (AMBIEN) 10 MG tablet TAKE 1/2-1 TABLET BY MOUTH AT BEDTIME AS NEEDED  30 tablet  5   No current facility-administered medications on file prior to visit.    Review of Systems    Review of Systems  Constitutional: Negative for fever, appetite change, fatigue and unexpected weight change.  Eyes: Negative for pain  and visual disturbance.  ENT pos for cong and rhinorrhea/ no ear pain  Respiratory: Negative for wheeze  and shortness of breath.   Cardiovascular: Negative for cp or palpitations    Gastrointestinal: Negative for nausea, diarrhea and constipation.  Genitourinary: Negative for urgency and frequency.  Skin: Negative for pallor or rash   Neurological: Negative for weakness, light-headedness, numbness and headaches.  Hematological: Negative for adenopathy. Does not bruise/bleed easily.  Psychiatric/Behavioral: Negative for dysphoric mood. The patient is not nervous/anxious.      Objective:   Physical Exam  Constitutional: She appears well-developed and well-nourished. No distress.  obese and well appearing   HENT:  Head: Normocephalic and atraumatic.  Right Ear: External ear normal.  Left Ear: External ear normal.  Mouth/Throat: Oropharynx is clear and moist. No oropharyngeal exudate.  Nares are injected and congested  No sinus tenderness Post nasal drip  Eyes: Conjunctivae and EOM are normal. Pupils are equal, round, and reactive to light. Right eye exhibits no discharge. Left eye exhibits no discharge.  Neck: Normal range of motion. Neck supple.  Cardiovascular: Normal rate and regular rhythm.   Pulmonary/Chest: Effort normal and breath sounds normal. No respiratory distress. She has no wheezes. She has no rales.  Harsh bs Few scattered rhonchi  Lymphadenopathy:    She has no cervical adenopathy.  Neurological: She is alert.  Skin: Skin is warm and dry. No rash noted.  Psychiatric: She has a normal mood and affect.          Assessment & Plan:

## 2013-05-19 NOTE — Patient Instructions (Signed)
Take zithromax for bronchitis Get rest and fluids Try codeine cough medicine- careful of sedation  Update if not starting to improve in a week or if worsening

## 2013-05-19 NOTE — Assessment & Plan Note (Signed)
Will cover with zpak in light of length of illness - 3 wk with prod cough  Trial of robitussin ac with caution of sedation  Disc symptomatic care - see instructions on AVS  Update if not starting to improve in a week or if worsening

## 2013-05-26 ENCOUNTER — Telehealth: Payer: Self-pay

## 2013-05-26 DIAGNOSIS — J209 Acute bronchitis, unspecified: Secondary | ICD-10-CM

## 2013-05-26 NOTE — Telephone Encounter (Signed)
It sounds like she is coming around - it may take a while to get the congestion under control- but because of her relative lack of imp - I would like to check a cxr - can she come today for one?  (I will put in the order if she can)

## 2013-05-26 NOTE — Telephone Encounter (Signed)
Pt advised of Dr. Royden Purl comments, pt said she is going to try to come in today for xray but it may be Monday

## 2013-05-26 NOTE — Telephone Encounter (Signed)
Ok- let me know when she comes and I will order the xr

## 2013-05-26 NOTE — Telephone Encounter (Signed)
Pt seen 05/19/13 and is slightly better but still prod cough with yellow mucus and a lot of congestion that make is hard to breathe.No wheezing or SOB and no fever. Cough med is not helping a lot and pt has finished antibiotic.Please advise. [facility].

## 2013-05-29 ENCOUNTER — Ambulatory Visit (INDEPENDENT_AMBULATORY_CARE_PROVIDER_SITE_OTHER)
Admission: RE | Admit: 2013-05-29 | Discharge: 2013-05-29 | Disposition: A | Discharge status: Home / Self Care | Payer: Medicare Other | Source: Ambulatory Visit | Attending: Family Medicine | Admitting: Family Medicine

## 2013-05-29 DIAGNOSIS — J209 Acute bronchitis, unspecified: Secondary | ICD-10-CM

## 2013-06-03 ENCOUNTER — Other Ambulatory Visit: Payer: Self-pay | Admitting: Family Medicine

## 2013-06-07 ENCOUNTER — Telehealth: Payer: Self-pay

## 2013-06-07 NOTE — Telephone Encounter (Signed)
Pt was checking on HCTZ refill; advised pt HCTZ was approved. Pt will ck [facility] about refill.

## 2013-06-07 NOTE — Telephone Encounter (Signed)
Pt left v/m requesting cb about refills being denied; did not leave name of med. Phone system down now. Will try pt later.

## 2013-07-10 ENCOUNTER — Ambulatory Visit: Payer: Medicare Other | Admitting: Internal Medicine

## 2013-08-02 ENCOUNTER — Other Ambulatory Visit: Payer: Self-pay | Admitting: Family Medicine

## 2013-08-09 ENCOUNTER — Other Ambulatory Visit: Payer: Self-pay | Admitting: Family Medicine

## 2013-08-09 NOTE — Telephone Encounter (Signed)
Electronic refill request, no recent f/u appt with you and pt cancelled her appt with Dr. Cruzita Lederer and never rescheduled, please advise

## 2013-08-09 NOTE — Telephone Encounter (Signed)
Please schedule f/u and refill until then, thanks 

## 2013-08-17 ENCOUNTER — Encounter: Payer: Self-pay | Admitting: Internal Medicine

## 2013-08-17 ENCOUNTER — Ambulatory Visit (INDEPENDENT_AMBULATORY_CARE_PROVIDER_SITE_OTHER): Payer: Medicare Other | Admitting: Internal Medicine

## 2013-08-17 VITALS — BP 122/64 | HR 76 | Temp 97.7°F | Resp 12 | Wt 187.4 lb

## 2013-08-17 DIAGNOSIS — E041 Nontoxic single thyroid nodule: Secondary | ICD-10-CM

## 2013-08-17 DIAGNOSIS — E039 Hypothyroidism, unspecified: Secondary | ICD-10-CM

## 2013-08-17 LAB — TSH: TSH: 0.42 u[IU]/mL (ref 0.35–5.50)

## 2013-08-17 LAB — T4, FREE: Free T4: 1.15 ng/dL (ref 0.60–1.60)

## 2013-08-17 NOTE — Progress Notes (Signed)
Subjective:     Patient ID: Kimberly [name], female   DOB: May 09, 1928, 80 y.o.   MRN: [id number]  HPI Kimberly [name] is a 80 y.o. pleasant woman returning for management of a Left thyroid nodule and hypothyroidism.  Reviewed hx: She had 75% of her right thyroid lobe surgically removed 40 years ago for an enlarging nodule. She started to take thyroid medicine since then. She was very well controlled on the medication, and only had to have her daily dose changed twice in these 40 years. At last visit with Dr. Glori Bickers, she was taking 50 mcg of Synthroid per day, but TSH returned 0.31 (08/02/2012), so her Synthroid was decreased to 25 mcg daily.   She saw Dr. Rondel Baton before 2008 and had a FNA >> benign (cannot remember the year), then she saw Dr. Wilson Singer around 2008. We reviewed together the images from the thyroid U/S from 04/2007 and from 08/2012: - in both: right thyroid lobe absent - in 2008: large left nodule 49 x 23 x 38 mm - "could represent a coalescence of several adjacent nodules", largest one 22 x 21 x 33 mm - in 2014: largest left nodule 52 x 24 x 42 mm, plus 2 hypoechoic nodules: one sup. 8 x 11.5 x 13 mm - with spongious aspect, and one 4.8 x 4.4 x 3.6 mm, hypoechoic. None of them have internal blood flow  Of note, the U/S report compared the 52 mm nodule from 2014 with the 22 mm nodule from 2008 (!) and reported significant growth.  Reviewed recent thyroid tests: Lab Results  Component Value Date   TSH 1.40 09/20/2012   TSH 0.31* 08/02/2012   TSH 1.19 04/20/2012   TSH 0.92 12/04/2011   TSH 0.14* 08/18/2011   FREET4 1.27 08/02/2012   FREET4 1.1 11/28/2008   FREET4 1.2 04/25/2008   She has fullness in her throat off and off, no dysphagia, mild hoarseness, feels lumps in left side of lower neck.   She takes Levothyroxine in am and takes Nexium before lunch. Takes MVI mid-morning.   I reviewed pt's medications, allergies, PMH, social hx, family hx and no changes required, except as mentioned  above.  Review of Systems Constitutional: no weight gain/loss, + fatigue, decreased appetite, no subjective hyperthermia and hypothermia, + nocturia Eyes: no blurry vision, no xerophthalmia ENT: no sore throat, + L nodule palpated in throat, no dysphagia/odynophagia, + occas. hoarseness Cardiovascular: no CP/SOB/palpitations/leg swelling Respiratory: no cough/SOB Gastrointestinal: no V/D/C/N Musculoskeletal: + muscle aches, no joint aches Skin: no rashes, + hair loss  Objective:   Physical Exam BP 122/64  Pulse 76  Temp(Src) 97.7 F (36.5 C) (Oral)  Resp 12  Wt 187 lb 6.4 oz (85.004 kg)  SpO2 96%  LMP 08/03/1978  Wt Readings from Last 3 Encounters:  08/17/13 187 lb 6.4 oz (85.004 kg)  05/19/13 186 lb 12 oz (84.709 kg)  03/21/13 184 lb 8 oz (83.689 kg)  Constitutional: overweight, in NAD Eyes: PERRLA, EOMI, no exophthalmos ENT: moist mucous membranes, L thyromegaly - smooth at palpation, mobile with swallowing, no cervical lymphadenopathy Cardiovascular: RRR, No MRG, bilat. periankle edema, non-pitting Respiratory: CTA B Gastrointestinal: abdomen soft, NT, ND, BS+ Musculoskeletal: no deformities, strength intact in all 4 Skin: moist, warm, no rashes     Assessment:     1. Left thyroid nodule - subjective neck compression spx   Thyroid U/S from 04/2007 compared to U/S from 08/2012: - in both: right thyroid lobe absent - in 2008: large left  nodule 49 x 23 x 38 mm - "could represent a coalescence of several adjacent nodules", largest one 22 x 21 x 33 mm - in 2014: largest left nodule 52 x 24 x 42 mm, plus 2 hypoechoic nodules: one sup. 8 x 11.5 x 13 mm - with spongious aspect, and one 4.8 x 4.4 x 3.6 mm, hypoechoic. None of them have internal blood flow  Of note, the U/S report compared the 52 mm nodule from 2014 with the 22 mm nodule from 2008 (!) and reported significant growth.  08/16/2012: FNA Left dominant nodule: BENIGN THYROID NODULE WITH CYSTIC CHANGE   2. S/p R  thyroidectomy for MNG - postsurgical mild hypothyroidism    Plan:   1. Left thyroid nodule - pt has a left dominant nodule, ~ stable in size (see above) from 2008 and 2014. The nodule is cystic/spongiform >> she has ocasions when she feels the nodule is larger and is compressing her neck, and periods in which is not bothered by it. I believe these correspond to fluctuations in the volume of colloid fluid within the nodule. She does not have problems with swallowing or breathing from the nodule, and agrees to continue to monitor it rather than refer to surgery for resection.  - Since the nodule is benign per the latest biopsy a year ago, and does not impair her breathing or swallowing, I think that this can be monitored clinically. I advised her to let me know if the nodules does bothering her more in the future. Would only repeat ultrasounds if significant clinical change. - we discussed about further followup, and she asks me whether she needs to continue to see me or she can continue with Dr. Glori Bickers. It is more convenient for her to see Dr Glori Bickers for her thyroid - she will return to see me on an as-needed basis.   2. Mild hypothyroidism - after our last visit, she started taking the levothyroxine in am on an empty stomach, separated by b'fast by >30 min. She takes Nexium at lunchtime, but she takes multivitamins midmorning. I advised her to date the multivitamins at night. - we will recheck a TSH, free T4 today - If they are normal, she will need to have TFTs monitored on a yearly basis  Office Visit on 08/17/2013  Component Date Value Range Status  . TSH 08/17/2013 0.42  0.35 - 5.50 uIU/mL Final  . Free T4 08/17/2013 1.15  0.60 - 1.60 ng/dL Final   Labs normal >> continue current Levothyroxine dose.

## 2013-08-17 NOTE — Patient Instructions (Signed)
Please stop at the lab. You can continue to follow with Dr Glori Bickers - with thyroid tests checked once a year - and return to see me as needed.

## 2013-10-10 ENCOUNTER — Ambulatory Visit: Payer: Medicare Other | Admitting: Family Medicine

## 2013-10-13 ENCOUNTER — Ambulatory Visit (INDEPENDENT_AMBULATORY_CARE_PROVIDER_SITE_OTHER): Payer: Medicare Other | Admitting: Family Medicine

## 2013-10-13 ENCOUNTER — Encounter: Payer: Self-pay | Admitting: Family Medicine

## 2013-10-13 VITALS — BP 130/78 | HR 64 | Temp 97.7°F | Ht 63.5 in | Wt 188.0 lb

## 2013-10-13 DIAGNOSIS — J019 Acute sinusitis, unspecified: Secondary | ICD-10-CM | POA: Insufficient documentation

## 2013-10-13 DIAGNOSIS — B9689 Other specified bacterial agents as the cause of diseases classified elsewhere: Secondary | ICD-10-CM

## 2013-10-13 MED ORDER — AZITHROMYCIN 250 MG PO TABS: ORAL_TABLET | ORAL | Status: DC

## 2013-10-13 MED ORDER — LEVOTHYROXINE SODIUM 25 MCG PO TABS: ORAL_TABLET | ORAL | Status: DC

## 2013-10-13 MED ORDER — AMLODIPINE BESYLATE 5 MG PO TABS: ORAL_TABLET | ORAL | Status: DC

## 2013-10-13 MED ORDER — FLUTICASONE PROPIONATE 50 MCG/ACT NA SUSP: 2.0000 | Freq: Every day | NASAL | Status: DC

## 2013-10-13 MED ORDER — LOSARTAN POTASSIUM 100 MG PO TABS: 100.0000 mg | ORAL_TABLET | Freq: Every day | ORAL | Status: DC

## 2013-10-13 MED ORDER — ZOLPIDEM TARTRATE 10 MG PO TABS: ORAL_TABLET | ORAL | Status: DC

## 2013-10-13 MED ORDER — ESOMEPRAZOLE MAGNESIUM 40 MG PO CPDR: DELAYED_RELEASE_CAPSULE | ORAL | Status: DC

## 2013-10-13 MED ORDER — METOPROLOL TARTRATE 50 MG PO TABS: ORAL_TABLET | ORAL | Status: DC

## 2013-10-13 MED ORDER — HYDROCHLOROTHIAZIDE 25 MG PO TABS: ORAL_TABLET | ORAL | Status: DC

## 2013-10-13 NOTE — Progress Notes (Signed)
Pre visit review using our clinic review tool, if applicable. No additional management support is needed unless otherwise documented below in the visit note. 

## 2013-10-13 NOTE — Patient Instructions (Signed)
I think you have a sinus infection  Rest and drink fluids Continue flonase Take zithromax as directed  Breathe steam / use warm compresses on face over sinuses  Also you can try afrin 12 hour nasal spray over the counter as directed for 2 days (no longer than 2 days)  Update if not starting to improve in a week or if worsening

## 2013-10-13 NOTE — Progress Notes (Signed)
Subjective:    Patient ID: Kimberly [name], female    DOB: 01/09/1928, 80 y.o.   MRN: [id number]  HPI Here with nasal congestion  Post nasal drip is making stomach upset  Pressure and pain in her face - both side  Her mucous is not yellow or green  Not much cough- just a little  Hoarse voice and has to clear throat a lot   Kind of achey  ? Fever  No chills or sweats    mucinex and zyrtec are not helping  No sneezing  Not a lot of runny nose   Is using her flonase   Still struggling with thyroid issue  Lab Results  Component Value Date   TSH 0.42 08/17/2013   still feeling tired and drained (though that could be from her uri) Will f/u with endocrinologist Had a partial thyroidectomy   Patient Active Problem List   Diagnosis Date Noted  . Acute bacterial sinusitis 10/13/2013  . Thrush, oral 03/21/2013  . UTI (urinary tract infection) 12/13/2012  . Left thyroid nodule 08/04/2012  . Obesity 12/04/2011  . Gastritis 12/04/2011  . Heart palpitations 09/01/2011  . Depression 05/26/2011  . Frequent UTI 12/30/2010  . Lower abdominal pain 11/27/2010  . Dysuria 11/26/2010  . COLONIC POLYPS 04/09/2010  . ANEMIA, MILD 04/09/2010  . HEMORRHOIDS 03/04/2010  . ANXIETY 12/10/2009  . PERSONAL HX COLONIC POLYPS 06/19/2008  . IRRITABLE BOWEL SYNDROME 03/28/2008  . 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 GOITER 03/29/2007  . HYPOTHYROIDISM 03/29/2007  . HYPERLIPIDEMIA 03/29/2007  . HYPERTENSION 03/29/2007  . CORONARY ARTERY DISEASE 03/29/2007  . BUNDLE BRANCH BLOCK 03/29/2007  . ALLERGIC RHINITIS 03/29/2007  . GERD 03/29/2007  . HYPERGLYCEMIA 03/29/2007   Past Medical History  Diagnosis Date  . Allergy     allergic rhinitis  . CAD (coronary artery disease)   . GERD (gastroesophageal reflux disease)   . Hyperlipidemia   . Hypertension   . Hypothyroid   . Insomnia    Past Surgical History  Procedure Laterality Date  . Oophorectomy    . Thyroid surgery      History  Substance Use Topics  . Smoking status: Never Smoker   . Smokeless tobacco: Not on file  . Alcohol Use: No   Family History  Problem Relation Age of Onset  . Heart disease Mother   . Hypertension Mother    Allergies  Allergen Reactions  . Cephalexin     REACTION: rash and burning sensation to skin on arms and legs with itching.  . Ciprofloxacin     REACTION: burning of mouth and skin  . Elavil [Amitriptyline]     palpitation  . Lisinopril     REACTION: cough  . Penicillins     REACTION: rash  . Remeron [Mirtazapine]     constipation  . Sulfonamide Derivatives     REACTION: malaise  . Tetanus Toxoid     REACTION: swelling  . Vesicare [Solifenacin Succinate] Other (See Comments)    Blurred vision and weakness  . Zoloft [Sertraline Hcl]     GI upset and nausea   Current Outpatient Prescriptions on File Prior to Visit  Medication Sig Dispense Refill  . acetaminophen (TYLENOL) 500 MG tablet OTC As directed.       Marland Kitchen aspirin 81 MG tablet Take 81 mg by mouth daily.        . cetirizine (ZYRTEC) 10 MG tablet 1 tablet by mouth daily as needed.       Marland Kitchen  guaiFENesin (MUCINEX) 600 MG 12 hr tablet OTC as directed.       Marland Kitchen guaiFENesin-codeine (ROBITUSSIN AC) 100-10 MG/5ML syrup Take 5 mLs by mouth 4 (four) times daily as needed for cough (watch out for sedation).  120 mL  0  . Multiple Vitamin (MULTIVITAMIN) capsule Take 1 capsule by mouth daily.        Marland Kitchen nystatin (MYCOSTATIN) 100000 UNIT/ML suspension Swish and swallow 1 teaspoon three times daily for a week for thrush  60 mL  0   No current facility-administered medications on file prior to visit.     Review of Systems Review of Systems  Constitutional: Negative for fever, appetite change,  and unexpected weight change.  Eyes: Negative for pain and visual disturbance.  ENT pos for congestion /rhinorrhea and facial pressure Respiratory: Negative for wheeze and shortness of breath.   Cardiovascular: Negative for cp or  palpitations    Gastrointestinal: Negative for nausea, diarrhea and constipation.  Genitourinary: Negative for urgency and frequency.  Skin: Negative for pallor or rash   Neurological: Negative for weakness, light-headedness, numbness and headaches.  Hematological: Negative for adenopathy. Does not bruise/bleed easily.  Psychiatric/Behavioral: Negative for dysphoric mood. The patient is not nervous/anxious.         Objective:   Physical Exam  Constitutional: She appears well-developed and well-nourished. No distress.  HENT:  Head: Normocephalic and atraumatic.  Right Ear: External ear normal.  Left Ear: External ear normal.  Mouth/Throat: Oropharynx is clear and moist. No oropharyngeal exudate.  Nares are injected and congested  Bilateral maxillary sinus tenderness   Eyes: Conjunctivae and EOM are normal. Pupils are equal, round, and reactive to light.  Neck: Normal range of motion. Neck supple.  Cardiovascular: Normal rate and regular rhythm.   Pulmonary/Chest: Effort normal and breath sounds normal. No respiratory distress. She has no wheezes. She has no rales.  Musculoskeletal: She exhibits no edema.  Lymphadenopathy:    She has no cervical adenopathy.  Neurological: She is alert. She has normal reflexes. No cranial nerve deficit.  Skin: Skin is warm and dry. No rash noted.  Psychiatric: She has a normal mood and affect.          Assessment & Plan:

## 2013-10-15 NOTE — Assessment & Plan Note (Signed)
Cover with zithromax Disc symptomatic care - see instructions on AVS Update if not starting to improve in a week or if worsening   Continue allergy medicines

## 2013-11-03 ENCOUNTER — Other Ambulatory Visit: Payer: Self-pay | Admitting: Family Medicine

## 2014-01-31 ENCOUNTER — Observation Stay (HOSPITAL_COMMUNITY): Payer: Medicare Other

## 2014-01-31 ENCOUNTER — Observation Stay (HOSPITAL_COMMUNITY)
Admission: EM | Admit: 2014-01-31 | Discharge: 2014-02-02 | Disposition: A | Discharge status: Home / Self Care | Payer: Medicare Other | Attending: Internal Medicine | Admitting: Internal Medicine

## 2014-01-31 ENCOUNTER — Encounter (HOSPITAL_COMMUNITY): Payer: Self-pay | Admitting: Emergency Medicine

## 2014-01-31 ENCOUNTER — Emergency Department (HOSPITAL_COMMUNITY): Payer: Medicare Other

## 2014-01-31 DIAGNOSIS — Z880 Allergy status to penicillin: Secondary | ICD-10-CM | POA: Insufficient documentation

## 2014-01-31 DIAGNOSIS — N390 Urinary tract infection, site not specified: Secondary | ICD-10-CM

## 2014-01-31 DIAGNOSIS — R5383 Other fatigue: Secondary | ICD-10-CM

## 2014-01-31 DIAGNOSIS — M549 Dorsalgia, unspecified: Secondary | ICD-10-CM | POA: Insufficient documentation

## 2014-01-31 DIAGNOSIS — R0789 Other chest pain: Principal | ICD-10-CM

## 2014-01-31 DIAGNOSIS — K2970 Gastritis, unspecified, without bleeding: Secondary | ICD-10-CM

## 2014-01-31 DIAGNOSIS — E669 Obesity, unspecified: Secondary | ICD-10-CM

## 2014-01-31 DIAGNOSIS — I10 Essential (primary) hypertension: Secondary | ICD-10-CM

## 2014-01-31 DIAGNOSIS — Z860100 Personal history of colon polyps, unspecified: Secondary | ICD-10-CM

## 2014-01-31 DIAGNOSIS — E041 Nontoxic single thyroid nodule: Secondary | ICD-10-CM

## 2014-01-31 DIAGNOSIS — R52 Pain, unspecified: Secondary | ICD-10-CM

## 2014-01-31 DIAGNOSIS — R1030 Lower abdominal pain, unspecified: Secondary | ICD-10-CM

## 2014-01-31 DIAGNOSIS — K219 Gastro-esophageal reflux disease without esophagitis: Secondary | ICD-10-CM | POA: Insufficient documentation

## 2014-01-31 DIAGNOSIS — J309 Allergic rhinitis, unspecified: Secondary | ICD-10-CM

## 2014-01-31 DIAGNOSIS — R1084 Generalized abdominal pain: Secondary | ICD-10-CM

## 2014-01-31 DIAGNOSIS — Z7982 Long term (current) use of aspirin: Secondary | ICD-10-CM | POA: Insufficient documentation

## 2014-01-31 DIAGNOSIS — I5033 Acute on chronic diastolic (congestive) heart failure: Secondary | ICD-10-CM | POA: Diagnosis present

## 2014-01-31 DIAGNOSIS — F329 Major depressive disorder, single episode, unspecified: Secondary | ICD-10-CM

## 2014-01-31 DIAGNOSIS — R0602 Shortness of breath: Secondary | ICD-10-CM | POA: Insufficient documentation

## 2014-01-31 DIAGNOSIS — J019 Acute sinusitis, unspecified: Secondary | ICD-10-CM

## 2014-01-31 DIAGNOSIS — I447 Left bundle-branch block, unspecified: Secondary | ICD-10-CM | POA: Diagnosis present

## 2014-01-31 DIAGNOSIS — R300 Dysuria: Secondary | ICD-10-CM

## 2014-01-31 DIAGNOSIS — D649 Anemia, unspecified: Secondary | ICD-10-CM

## 2014-01-31 DIAGNOSIS — Z79899 Other long term (current) drug therapy: Secondary | ICD-10-CM | POA: Insufficient documentation

## 2014-01-31 DIAGNOSIS — Z792 Long term (current) use of antibiotics: Secondary | ICD-10-CM | POA: Insufficient documentation

## 2014-01-31 DIAGNOSIS — E871 Hypo-osmolality and hyponatremia: Secondary | ICD-10-CM | POA: Diagnosis present

## 2014-01-31 DIAGNOSIS — K589 Irritable bowel syndrome without diarrhea: Secondary | ICD-10-CM

## 2014-01-31 DIAGNOSIS — E049 Nontoxic goiter, unspecified: Secondary | ICD-10-CM

## 2014-01-31 DIAGNOSIS — IMO0002 Reserved for concepts with insufficient information to code with codable children: Secondary | ICD-10-CM | POA: Insufficient documentation

## 2014-01-31 DIAGNOSIS — I509 Heart failure, unspecified: Secondary | ICD-10-CM

## 2014-01-31 DIAGNOSIS — K5900 Constipation, unspecified: Secondary | ICD-10-CM | POA: Diagnosis present

## 2014-01-31 DIAGNOSIS — I454 Nonspecific intraventricular block: Secondary | ICD-10-CM

## 2014-01-31 DIAGNOSIS — R5381 Other malaise: Secondary | ICD-10-CM | POA: Insufficient documentation

## 2014-01-31 DIAGNOSIS — G4700 Insomnia, unspecified: Secondary | ICD-10-CM

## 2014-01-31 DIAGNOSIS — Z882 Allergy status to sulfonamides status: Secondary | ICD-10-CM | POA: Insufficient documentation

## 2014-01-31 DIAGNOSIS — Z888 Allergy status to other drugs, medicaments and biological substances status: Secondary | ICD-10-CM | POA: Insufficient documentation

## 2014-01-31 DIAGNOSIS — B9689 Other specified bacterial agents as the cause of diseases classified elsewhere: Secondary | ICD-10-CM

## 2014-01-31 DIAGNOSIS — E785 Hyperlipidemia, unspecified: Secondary | ICD-10-CM

## 2014-01-31 DIAGNOSIS — R002 Palpitations: Secondary | ICD-10-CM

## 2014-01-31 DIAGNOSIS — F411 Generalized anxiety disorder: Secondary | ICD-10-CM

## 2014-01-31 DIAGNOSIS — E039 Hypothyroidism, unspecified: Secondary | ICD-10-CM

## 2014-01-31 DIAGNOSIS — R7309 Other abnormal glucose: Secondary | ICD-10-CM

## 2014-01-31 DIAGNOSIS — K649 Unspecified hemorrhoids: Secondary | ICD-10-CM

## 2014-01-31 DIAGNOSIS — F32A Depression, unspecified: Secondary | ICD-10-CM

## 2014-01-31 DIAGNOSIS — B370 Candidal stomatitis: Secondary | ICD-10-CM

## 2014-01-31 DIAGNOSIS — D126 Benign neoplasm of colon, unspecified: Secondary | ICD-10-CM

## 2014-01-31 DIAGNOSIS — I2510 Atherosclerotic heart disease of native coronary artery without angina pectoris: Secondary | ICD-10-CM

## 2014-01-31 DIAGNOSIS — I5031 Acute diastolic (congestive) heart failure: Secondary | ICD-10-CM

## 2014-01-31 DIAGNOSIS — Z881 Allergy status to other antibiotic agents status: Secondary | ICD-10-CM | POA: Insufficient documentation

## 2014-01-31 LAB — COMPREHENSIVE METABOLIC PANEL
ALT: 12 U/L (ref 0–35)
ANION GAP: 17 — AB (ref 5–15)
AST: 21 U/L (ref 0–37)
Albumin: 3.8 g/dL (ref 3.5–5.2)
Alkaline Phosphatase: 115 U/L (ref 39–117)
BILIRUBIN TOTAL: 0.5 mg/dL (ref 0.3–1.2)
BUN: 9 mg/dL (ref 6–23)
CALCIUM: 8.9 mg/dL (ref 8.4–10.5)
CHLORIDE: 86 meq/L — AB (ref 96–112)
CO2: 24 mEq/L (ref 19–32)
CREATININE: 0.75 mg/dL (ref 0.50–1.10)
GFR, EST AFRICAN AMERICAN: 88 mL/min — AB (ref >90)
GFR, EST NON AFRICAN AMERICAN: 76 mL/min — AB (ref >90)
GLUCOSE: 124 mg/dL — AB (ref 70–99)
Potassium: 3.4 mEq/L — ABNORMAL LOW (ref 3.7–5.3)
Sodium: 127 mEq/L — ABNORMAL LOW (ref 137–147)
Total Protein: 7.3 g/dL (ref 6.0–8.3)

## 2014-01-31 LAB — CBC
HCT: 34.4 % — ABNORMAL LOW (ref 36.0–46.0)
Hemoglobin: 11.6 g/dL — ABNORMAL LOW (ref 12.0–15.0)
MCH: 28.2 pg (ref 26.0–34.0)
MCHC: 33.7 g/dL (ref 30.0–36.0)
MCV: 83.7 fL (ref 78.0–100.0)
Platelets: 260 10*3/uL (ref 150–400)
RBC: 4.11 MIL/uL (ref 3.87–5.11)
RDW: 13.7 % (ref 11.5–15.5)
WBC: 4.5 10*3/uL (ref 4.0–10.5)

## 2014-01-31 LAB — TSH: TSH: 1.3 u[IU]/mL (ref 0.350–4.500)

## 2014-01-31 LAB — I-STAT TROPONIN, ED: Troponin i, poc: 0 ng/mL (ref 0.00–0.08)

## 2014-01-31 LAB — PRO B NATRIURETIC PEPTIDE: Pro B Natriuretic peptide (BNP): 1965 pg/mL — ABNORMAL HIGH (ref 0–450)

## 2014-01-31 LAB — I-STAT CG4 LACTIC ACID, ED: Lactic Acid, Venous: 0.99 mmol/L (ref 0.5–2.2)

## 2014-01-31 MED ORDER — FENTANYL CITRATE 0.05 MG/ML IJ SOLN
25.0000 ug | Freq: Once | INTRAMUSCULAR | Status: AC
  Administered 2014-01-31: 25 ug via INTRAVENOUS
  Filled 2014-01-31: qty 2

## 2014-01-31 MED ORDER — AMLODIPINE BESYLATE 5 MG PO TABS
5.0000 mg | ORAL_TABLET | Freq: Every day | ORAL | Status: DC
  Administered 2014-01-31 – 2014-02-01 (×2): 5 mg via ORAL
  Filled 2014-01-31 (×3): qty 1

## 2014-01-31 MED ORDER — SODIUM CHLORIDE 0.9 % IJ SOLN: 3.0000 mL | INTRAMUSCULAR | Status: DC | PRN

## 2014-01-31 MED ORDER — SODIUM CHLORIDE 0.9 % IJ SOLN
3.0000 mL | Freq: Two times a day (BID) | INTRAMUSCULAR | Status: DC
  Administered 2014-01-31 – 2014-02-01 (×3): 3 mL via INTRAVENOUS

## 2014-01-31 MED ORDER — SODIUM CHLORIDE 0.9 % IV BOLUS (SEPSIS)
500.0000 mL | INTRAVENOUS | Status: AC
  Administered 2014-01-31: 500 mL via INTRAVENOUS

## 2014-01-31 MED ORDER — HYDROMORPHONE HCL PF 1 MG/ML IJ SOLN: 0.5000 mg | INTRAMUSCULAR | Status: AC | PRN

## 2014-01-31 MED ORDER — HYDROMORPHONE HCL PF 1 MG/ML IJ SOLN
0.5000 mg | Freq: Once | INTRAMUSCULAR | Status: AC
  Administered 2014-01-31: 0.5 mg via INTRAVENOUS
  Filled 2014-01-31: qty 1

## 2014-01-31 MED ORDER — METOPROLOL TARTRATE 50 MG PO TABS
50.0000 mg | ORAL_TABLET | Freq: Two times a day (BID) | ORAL | Status: DC
  Administered 2014-01-31 – 2014-02-02 (×4): 50 mg via ORAL
  Filled 2014-01-31 (×6): qty 1

## 2014-01-31 MED ORDER — GI COCKTAIL ~~LOC~~
30.0000 mL | Freq: Once | ORAL | Status: AC
  Administered 2014-01-31: 30 mL via ORAL
  Filled 2014-01-31: qty 30

## 2014-01-31 MED ORDER — ACETAMINOPHEN 325 MG PO TABS: 650.0000 mg | ORAL_TABLET | ORAL | Status: DC | PRN

## 2014-01-31 MED ORDER — ONDANSETRON 4 MG PO TBDP
4.0000 mg | ORAL_TABLET | Freq: Once | ORAL | Status: AC
  Administered 2014-01-31: 4 mg via ORAL
  Filled 2014-01-31: qty 1

## 2014-01-31 MED ORDER — FLUTICASONE PROPIONATE 50 MCG/ACT NA SUSP
2.0000 | Freq: Every day | NASAL | Status: DC
Start: 2014-01-31 — End: 2014-02-02
  Administered 2014-01-31 – 2014-02-02 (×3): 2 via NASAL
  Filled 2014-01-31: qty 16

## 2014-01-31 MED ORDER — FUROSEMIDE 20 MG PO TABS
20.0000 mg | ORAL_TABLET | Freq: Two times a day (BID) | ORAL | Status: DC
  Administered 2014-01-31 – 2014-02-01 (×3): 20 mg via ORAL
  Filled 2014-01-31 (×6): qty 1

## 2014-01-31 MED ORDER — SODIUM CHLORIDE 0.9 % IV SOLN: 250.0000 mL | INTRAVENOUS | Status: DC | PRN

## 2014-01-31 MED ORDER — ONDANSETRON HCL 4 MG/2ML IJ SOLN: 4.0000 mg | Freq: Four times a day (QID) | INTRAMUSCULAR | Status: DC | PRN

## 2014-01-31 MED ORDER — ONDANSETRON HCL 4 MG/2ML IJ SOLN: 4.0000 mg | Freq: Three times a day (TID) | INTRAMUSCULAR | Status: DC | PRN

## 2014-01-31 MED ORDER — ZOLPIDEM TARTRATE 5 MG PO TABS
5.0000 mg | ORAL_TABLET | Freq: Every evening | ORAL | Status: DC | PRN
  Administered 2014-01-31 – 2014-02-01 (×2): 5 mg via ORAL
  Filled 2014-01-31 (×2): qty 1

## 2014-01-31 MED ORDER — LEVOTHYROXINE SODIUM 25 MCG PO TABS
25.0000 ug | ORAL_TABLET | Freq: Every day | ORAL | Status: DC
  Administered 2014-02-01 – 2014-02-02 (×2): 25 ug via ORAL
  Filled 2014-01-31 (×3): qty 1

## 2014-01-31 MED ORDER — ASPIRIN 81 MG PO CHEW
81.0000 mg | CHEWABLE_TABLET | Freq: Every day | ORAL | Status: DC
  Administered 2014-02-01 – 2014-02-02 (×2): 81 mg via ORAL
  Filled 2014-01-31 (×2): qty 1

## 2014-01-31 MED ORDER — PANTOPRAZOLE SODIUM 40 MG PO TBEC
80.0000 mg | DELAYED_RELEASE_TABLET | Freq: Every day | ORAL | Status: DC
Start: 2014-02-01 — End: 2014-02-02
  Administered 2014-02-01 – 2014-02-02 (×2): 80 mg via ORAL
  Filled 2014-01-31 (×2): qty 2

## 2014-01-31 MED ORDER — LOSARTAN POTASSIUM 50 MG PO TABS
100.0000 mg | ORAL_TABLET | Freq: Every day | ORAL | Status: DC
  Administered 2014-02-01 – 2014-02-02 (×2): 100 mg via ORAL
  Filled 2014-01-31 (×2): qty 2

## 2014-01-31 MED ORDER — ENOXAPARIN SODIUM 40 MG/0.4ML ~~LOC~~ SOLN
40.0000 mg | SUBCUTANEOUS | Status: DC
  Administered 2014-01-31 – 2014-02-01 (×2): 40 mg via SUBCUTANEOUS
  Filled 2014-01-31 (×3): qty 0.4

## 2014-01-31 NOTE — ED Notes (Signed)
Dr. Lockwood at the bedside. 

## 2014-01-31 NOTE — H&P (Signed)
[facility] History and Physical  Kimberly [name] PQZ:[id number] DOB: [birth year] DOA: 01/31/2014  Referring physician: Carmin Muskrat, ER physician PCP: Loura Pardon, MD   Chief Complaint: Abdominal pain  HPI: Kimberly [name] is a 80 y.o. female  With past medical history of hypothyroidism and hypertension who had noted over the last week that she's been having increased problems with dyspnea on exertion, cramping abdominal pain and overall feeling of malaise and poor appetite. She continued to worsen she came into the emergency room today for further evaluation. Her chest x-ray in the emergency room was unremarkable, however a BNP was noted to be elevated at 1900. Patient has a previous history of heart failure. She is also noted to have a slightly low sodium level of 127. The rest of her lab work was unremarkable. Hospitals were called for further evaluation and admission.   Review of Systems:  Patient seen after arrival to floor. Feeling a little bit better. He feels mildly nauseated and not much appetite. Denies any headaches, vision changes, dysphagia, chest pain, palpitations. She does complain of some overall shortness of breath with dyspnea on exertion. Denies any wheezing or coughing. Does complain of crampy generalized abdominal pain most notably in the midepigastric region, but also radiates down to the bilateral lower quadrants. She denies any dysuria or diarrhea. She probably chronic constipation and she says that a few days she's had a bowel movement. She does complain of lower extremity swelling. She denies any focal extremity numbness or weakness or pain. Review of systems otherwise negative.  Past Medical History  Diagnosis Date  . Allergy     allergic rhinitis  . CAD (coronary artery disease)   . GERD (gastroesophageal reflux disease)   . Hyperlipidemia   . Hypertension   . Hypothyroid   . Insomnia    Past Surgical History  Procedure Laterality Date  . Oophorectomy    .  Thyroid surgery     Social History:  reports that she has never smoked. She does not have any smokeless tobacco history on file. She reports that she does not drink alcohol or use illicit drugs. patient lives at home by herself with family close by. She is normally able to ambulate without the use of a walker or cane  Allergies  Allergen Reactions  . Cephalexin     REACTION: rash and burning sensation to skin on arms and legs with itching.  . Ciprofloxacin     REACTION: burning of mouth and skin  . Elavil [Amitriptyline]     palpitation  . Lisinopril     REACTION: cough  . Penicillins     REACTION: rash  . Remeron [Mirtazapine]     constipation  . Sulfonamide Derivatives     REACTION: malaise  . Tetanus Toxoid     REACTION: swelling  . Vesicare [Solifenacin Succinate] Other (See Comments)    Blurred vision and weakness  . Zoloft [Sertraline Hcl]     GI upset and nausea    Family History  Problem Relation Age of Onset  . Heart disease Mother   . Hypertension Mother    confirmed with patient  Prior to Admission medications   Medication Sig Start Date End Date Taking? Authorizing Provider  amLODipine (NORVASC) 5 MG tablet Take 5 mg by mouth at bedtime.   Yes Historical Provider, MD  aspirin 81 MG tablet Take 81 mg by mouth daily.     Yes Historical Provider, MD  cetirizine (ZYRTEC) 10 MG tablet Take  10 mg by mouth daily as needed for allergies.    Yes Historical Provider, MD  esomeprazole (NEXIUM) 40 MG capsule TAKE 1 CAPSULE DAILY 10/13/13  Yes Abner Greenspan, MD  fluticasone (FLONASE) 50 MCG/ACT nasal spray Place 2 sprays into both nostrils daily. 10/13/13  Yes Abner Greenspan, MD  guaiFENesin (MUCINEX) 600 MG 12 hr tablet Take 600 mg by mouth 2 (two) times daily as needed for cough or to loosen phlegm.    Yes Historical Provider, MD  hydrochlorothiazide (HYDRODIURIL) 25 MG tablet TAKE 1/2 TABLET (=12.5MG )  DAILY 10/13/13  Yes Abner Greenspan, MD  levothyroxine (SYNTHROID,  LEVOTHROID) 25 MCG tablet TAKE 1 TABLET BY MOUTH ONCE A DAY 10/13/13  Yes Abner Greenspan, MD  losartan (COZAAR) 100 MG tablet Take 1 tablet (100 mg total) by mouth daily. 10/13/13 10/13/14 Yes Abner Greenspan, MD  metoprolol (LOPRESSOR) 50 MG tablet TAKE 1 TABLET TWICE A DAY 10/13/13  Yes Abner Greenspan, MD  Multiple Vitamin (MULTIVITAMIN) capsule Take 1 capsule by mouth daily.     Yes Historical Provider, MD  zolpidem (AMBIEN) 10 MG tablet TAKE 1/2-1 TABLET BY MOUTH AT BEDTIME AS NEEDED 10/13/13  Yes Abner Greenspan, MD   Physical Exam: Filed Vitals:   01/31/14 1642  BP: 167/57  Pulse: 67  Temp: 98.3 F (36.8 C)  Resp: 16    BP 167/57  Pulse 67  Temp(Src) 98.3 F (36.8 C) (Oral)  Resp 16  SpO2 93%  LMP 08/03/1978  General:  Alert and oriented x3, no acute distress Eyes: Sclera nonicteric, extraocular movements are intact ENT: Normocephalic, atraumatic, mucous membranes are slightly dry Neck: No JVD Cardiovascular: Regular rate and rhythm, occasional ectopic beat Respiratory: Clear to auscultation bilaterally Abdomen: Soft, obese, nontender, hypoactive bowel sounds Skin: Number of varicose veins on lower extremities Musculoskeletal: grossly normal tone BUE/BLE Psychiatric: Patient is appropriate, no evidence of psychoses Neurologic: No focal deficits           Labs on Admission:  Basic Metabolic Panel:  Recent Labs Lab 01/31/14 1137  NA 127*  K 3.4*  CL 86*  CO2 24  GLUCOSE 124*  BUN 9  CREATININE 0.75  CALCIUM 8.9   Liver Function Tests:  Recent Labs Lab 01/31/14 1137  AST 21  ALT 12  ALKPHOS 115  BILITOT 0.5  PROT 7.3  ALBUMIN 3.8   No results found for this basename: LIPASE, AMYLASE,  in the last 168 hours No results found for this basename: AMMONIA,  in the last 168 hours CBC:  Recent Labs Lab 01/31/14 1137  WBC 4.5  HGB 11.6*  HCT 34.4*  MCV 83.7  PLT 260   Cardiac Enzymes: No results found for this basename: CKTOTAL, CKMB, CKMBINDEX,  TROPONINI,  in the last 168 hours  BNP (last 3 results)  Recent Labs  01/31/14 1137  PROBNP 1965.0*   CBG: No results found for this basename: GLUCAP,  in the last 168 hours  Radiological Exams on Admission: Dg Chest 2 View  01/31/2014   CLINICAL DATA:  Shortness of breath and pain  EXAM: CHEST  2 VIEW  COMPARISON:  May 29, 2013  FINDINGS: There is stable eventration of the right hemidiaphragm. There is no edema or consolidation. The heart size and pulmonary vascularity are normal. No adenopathy. There is atherosclerotic change in the aorta.  IMPRESSION: No edema or consolidation.   Electronically Signed   By: Lowella Grip M.D.   On: 01/31/2014 11:07  EKG: Independently reviewed. Left bundle branch block, occasional PVCs  Assessment/Plan Principal Problem:  Acute diastolic heart failure: Principal problem. New diagnosis. Elevated BNP. No previous history. Check echocardiogram. Have started gentle Lasix. Strict input and output and daily weights.   Active Problems:  HYPOTHYROIDISM: Continue Synthroid. Check TSH given issues with constipation as well as heart failure   HYPERTENSION : Continue beta blocker and ACE inhibitor. Using Lasix instead of HCTZ  BUNDLE BRANCH BLOCK: Patient's previous history of left bundle branch block. Unchanged   Obesity: Patient has a previous history of. We'll check weights to confirm BMI   Abdominal pain, acute, generalized: Patient has history of constipation. Do not have bowel movement in several days. Check abdominal x-ray.   Hyponatremia: Suspect that this is dilutional given signs of heart failure. Could also be from dehydration given poor by mouth intake. Patient received gentle IV fluids in the emergency room. Have started Lasix and no IV fluids now. Recheck labs in the morning.   Unspecified constipation: As above.     Code Status: DO NOT RESUSCITATE as confirmed by patient  Family Communication: Daughter and son at the  bedside.  Disposition Plan: Home in the next few days once workup complete and patient diuresed  Time spent: 35 minutes  [facility] Pager [phone]  **Disclaimer: This note may have been dictated with voice recognition software. Similar sounding words can inadvertently be transcribed and this note may contain transcription errors which may not have been corrected upon publication of note.**

## 2014-01-31 NOTE — ED Notes (Signed)
Pt c/o generalized chest and back pain with SOB and cough and congestion x 3 days that is worse today; pt appears tachyponic at present

## 2014-01-31 NOTE — ED Notes (Signed)
NOTIFIED DR. LOCKWOOD FOR PATIENTS LAB RESULTS OF CG4+ LACTIC ACID ,@12 :15 PM ,01/31/2014

## 2014-01-31 NOTE — ED Notes (Signed)
Pt placed on monitor after returning from restroom. Pt denies any shortness of breath or dizziness while ambulating.

## 2014-01-31 NOTE — ED Provider Notes (Signed)
CSN: [id number]     Arrival date & time 01/31/14  1032 History   First MD Initiated Contact with Patient 01/31/14 1105     Chief Complaint  Patient presents with  . Shortness of Breath  . Chest Pain  . Back Pain     (Consider location/radiation/quality/duration/timing/severity/associated sxs/prior Treatment) HPI Patient presents with concern of ongoing chest pain, back pain, dyspnea, generalized fatigue. Symptoms began approximately 4 days ago.  Prior to that time the patient has episodic dyspnea. Since onset patient has had ongoing cough, congestion, dyspnea, with mild back discomfort, anterior chest pressure. No clear alleviating, exacerbating, perspective factors. No no vomiting, diarrhea, though the patient is nauseous.  Past Medical History  Diagnosis Date  . Allergy     allergic rhinitis  . CAD (coronary artery disease)   . GERD (gastroesophageal reflux disease)   . Hyperlipidemia   . Hypertension   . Hypothyroid   . Insomnia    Past Surgical History  Procedure Laterality Date  . Oophorectomy    . Thyroid surgery     Family History  Problem Relation Age of Onset  . Heart disease Mother   . Hypertension Mother    History  Substance Use Topics  . Smoking status: Never Smoker   . Smokeless tobacco: Not on file  . Alcohol Use: No   OB History   Grav Para Term Preterm Abortions TAB SAB Ect Mult Living                 Review of Systems  Constitutional:       Per HPI, otherwise negative  HENT:       Per HPI, otherwise negative  Respiratory:       Per HPI, otherwise negative  Cardiovascular:       Per HPI, otherwise negative  Gastrointestinal: Negative for vomiting.  Endocrine:       Negative aside from HPI  Genitourinary:       Neg aside from HPI   Musculoskeletal:       Per HPI, otherwise negative  Skin: Negative.   Neurological: Negative for syncope.      Allergies  Cephalexin; Ciprofloxacin; Elavil; Lisinopril; Penicillins; Remeron;  Sulfonamide derivatives; Tetanus toxoid; Vesicare; and Zoloft  Home Medications   Prior to Admission medications   Medication Sig Start Date End Date Taking? Authorizing Provider  acetaminophen (TYLENOL) 500 MG tablet OTC As directed.     Historical Provider, MD  amLODipine (NORVASC) 5 MG tablet TAKE 1 TABLET BY MOUTH ONCE A DAY 10/13/13   Abner Greenspan, MD  aspirin 81 MG tablet Take 81 mg by mouth daily.      Historical Provider, MD  azithromycin (ZITHROMAX Z-PAK) 250 MG tablet Take 2 pills by mouth today and then 1 pill daily for 4 days 10/13/13   Abner Greenspan, MD  cetirizine (ZYRTEC) 10 MG tablet 1 tablet by mouth daily as needed.     Historical Provider, MD  esomeprazole (NEXIUM) 40 MG capsule TAKE 1 CAPSULE DAILY 10/13/13   Abner Greenspan, MD  fluticasone [facility]) 50 MCG/ACT nasal spray Place 2 sprays into both nostrils daily. 10/13/13   Abner Greenspan, MD  guaiFENesin (MUCINEX) 600 MG 12 hr tablet OTC as directed.     Historical Provider, MD  guaiFENesin-codeine (ROBITUSSIN AC) 100-10 MG/5ML syrup Take 5 mLs by mouth 4 (four) times daily as needed for cough (watch out for sedation). 05/19/13   Abner Greenspan, MD  hydrochlorothiazide (HYDRODIURIL) 25 MG  tablet TAKE 1/2 TABLET (=12.5MG )  DAILY 10/13/13   Abner Greenspan, MD  levothyroxine (SYNTHROID, LEVOTHROID) 25 MCG tablet TAKE 1 TABLET BY MOUTH ONCE A DAY 10/13/13   Abner Greenspan, MD  losartan (COZAAR) 100 MG tablet Take 1 tablet (100 mg total) by mouth daily. 10/13/13 10/13/14  Abner Greenspan, MD  metoprolol (LOPRESSOR) 50 MG tablet TAKE 1 TABLET TWICE A DAY 10/13/13   Abner Greenspan, MD  Multiple Vitamin (MULTIVITAMIN) capsule Take 1 capsule by mouth daily.      Historical Provider, MD  nystatin (MYCOSTATIN) 100000 UNIT/ML suspension Swish and swallow 1 teaspoon three times daily for a week for thrush 03/21/13   Abner Greenspan, MD  zolpidem (AMBIEN) 10 MG tablet TAKE 1/2-1 TABLET BY MOUTH AT BEDTIME AS NEEDED 10/13/13   Abner Greenspan, MD   BP 168/76   Pulse 65  Temp(Src) 97.8 F (36.6 C) (Oral)  Resp 16  SpO2 94%  LMP 08/03/1978 Physical Exam  Nursing note and vitals reviewed. Constitutional: She is oriented to person, place, and time. She appears well-developed and well-nourished. No distress.  Obese elderly female resting in bed, in no distress  HENT:  Head: Normocephalic and atraumatic.  Eyes: Conjunctivae and EOM are normal.  Cardiovascular: Normal rate and regular rhythm.   Pulmonary/Chest: Effort normal and breath sounds normal. No stridor. No respiratory distress.  Abdominal: She exhibits no distension. There is no tenderness.  Musculoskeletal: She exhibits no edema.  Neurological: She is alert and oriented to person, place, and time. No cranial nerve deficit.  Skin: Skin is warm and dry.  Psychiatric: She has a normal mood and affect.    ED Course  Procedures (including critical care time) Labs Review Labs Reviewed  CBC - Abnormal; Notable for the following:    Hemoglobin 11.6 (*)    HCT 34.4 (*)    All other components within normal limits  PRO B NATRIURETIC PEPTIDE - Abnormal; Notable for the following:    Pro B Natriuretic peptide (BNP) 1965.0 (*)    All other components within normal limits  COMPREHENSIVE METABOLIC PANEL - Abnormal; Notable for the following:    Sodium 127 (*)    Potassium 3.4 (*)    Chloride 86 (*)    Glucose, Bld 124 (*)    GFR calc non Af Amer 76 (*)    GFR calc Af Amer 88 (*)    Anion gap 17 (*)    All other components within normal limits  I-STAT TROPOININ, ED  I-STAT CG4 LACTIC ACID, ED    Imaging Review Dg Chest 2 View  01/31/2014   CLINICAL DATA:  Shortness of breath and pain  EXAM: CHEST  2 VIEW  COMPARISON:  May 29, 2013  FINDINGS: There is stable eventration of the right hemidiaphragm. There is no edema or consolidation. The heart size and pulmonary vascularity are normal. No adenopathy. There is atherosclerotic change in the aorta.  IMPRESSION: No edema or consolidation.    Electronically Signed   By: Lowella Grip M.D.   On: 01/31/2014 11:07     O2- 99% ra, nml    EKG Interpretation   Date/Time:  Wednesday January 31 2014 10:35:24 EDT Ventricular Rate:  79 PR Interval:  262 QRS Duration: 152 QT Interval:  440 QTC Calculation: 504 R Axis:   -67 Text Interpretation:  Sinus rhythm with 1st degree A-V block with frequent  Premature ventricular complexes Left axis deviation Left bundle branch  block Abnormal ECG  Sinus rhythm Premature ventricular complexes Left  bundle branch block Left axis deviation Abnormal ekg Confirmed by  Carmin Muskrat  MD ([id number]) on 01/31/2014 11:24:40 AM     2:24 PM Respiratory difficulty has decreased. Patient describes onset of abdomen. Patient has a history of IBS, and on initial exam, she had a soft, non-peritoneal abdomen. Patient's labs are reassuring, she is afebrile, she is not vomiting, no diarrhea. She received antiemetics, analgesics. MDM   Patient presents with dyspnea, chest pain, back pain for several days. Patient's evaluation is most notable for demonstration of hyponatremia, elevated BNP Patient has no diagnosed history of congestive heart failure, and prior electrolyte ve been essentially unremarkable.  patient improved here with therapy, though with her elevated BNP, hyponatremia, age, she was admitted for further evaluation and management of this episode of chest pain.     Carmin Muskrat, MD 01/31/14 1426

## 2014-02-01 ENCOUNTER — Encounter (HOSPITAL_COMMUNITY): Payer: Self-pay | Admitting: *Deleted

## 2014-02-01 DIAGNOSIS — R109 Unspecified abdominal pain: Secondary | ICD-10-CM

## 2014-02-01 DIAGNOSIS — I059 Rheumatic mitral valve disease, unspecified: Secondary | ICD-10-CM

## 2014-02-01 DIAGNOSIS — R0789 Other chest pain: Secondary | ICD-10-CM

## 2014-02-01 DIAGNOSIS — E669 Obesity, unspecified: Secondary | ICD-10-CM

## 2014-02-01 DIAGNOSIS — I454 Nonspecific intraventricular block: Secondary | ICD-10-CM

## 2014-02-01 LAB — BASIC METABOLIC PANEL
ANION GAP: 15 (ref 5–15)
BUN: 11 mg/dL (ref 6–23)
CALCIUM: 8.9 mg/dL (ref 8.4–10.5)
CO2: 27 meq/L (ref 19–32)
Chloride: 89 mEq/L — ABNORMAL LOW (ref 96–112)
Creatinine, Ser: 1.01 mg/dL (ref 0.50–1.10)
GFR calc Af Amer: 58 mL/min — ABNORMAL LOW (ref >90)
GFR calc non Af Amer: 50 mL/min — ABNORMAL LOW (ref >90)
Glucose, Bld: 94 mg/dL (ref 70–99)
Potassium: 3.1 mEq/L — ABNORMAL LOW (ref 3.7–5.3)
Sodium: 131 mEq/L — ABNORMAL LOW (ref 137–147)

## 2014-02-01 MED ORDER — FLEET ENEMA 7-19 GM/118ML RE ENEM
1.0000 | ENEMA | Freq: Once | RECTAL | Status: AC
  Administered 2014-02-01: 1 via RECTAL
  Filled 2014-02-01: qty 1

## 2014-02-01 MED ORDER — POTASSIUM CHLORIDE CRYS ER 20 MEQ PO TBCR
40.0000 meq | EXTENDED_RELEASE_TABLET | Freq: Every day | ORAL | Status: DC
  Administered 2014-02-01 – 2014-02-02 (×2): 40 meq via ORAL
  Filled 2014-02-01 (×3): qty 2

## 2014-02-01 MED ORDER — POLYETHYLENE GLYCOL 3350 17 G PO PACK
17.0000 g | PACK | Freq: Once | ORAL | Status: AC
  Administered 2014-02-01: 17 g via ORAL
  Filled 2014-02-01: qty 1

## 2014-02-01 MED ORDER — DOCUSATE SODIUM 100 MG PO CAPS
100.0000 mg | ORAL_CAPSULE | Freq: Two times a day (BID) | ORAL | Status: DC
  Administered 2014-02-01 – 2014-02-02 (×3): 100 mg via ORAL
  Filled 2014-02-01 (×4): qty 1

## 2014-02-01 NOTE — Progress Notes (Signed)
UR Completed.  Kimberly [name] Jane [phone] 02/01/2014  

## 2014-02-01 NOTE — Evaluation (Signed)
Physical Therapy Evaluation Patient Details Name: Kimberly [name] MRN: [id number] DOB: 09-26-1927 Today's Date: 02/01/2014   History of Present Illness  Patient is a 80 y/o female with PMH of HTN, hypothyroidism and heart failure admitted with complaints of abdominal pain, dyspnea on exertion, cramping and feeling of malaise. Chest X-ray (-). BNP 1900.   Clinical Impression  Patient is a pleasant female that presents with mild balance deficits while performing higher level balance challenges and fatigues quickly during exercise. Pt exhibits SOB on exertion, however vitals WFL. Pt cooperative with therapy. Pt would benefit from skilled therapy in acute setting to maximize independence so pt can safely return to PLOF.   Follow Up Recommendations No PT follow up;Supervision - Intermittent    Equipment Recommendations  None recommended by PT    Recommendations for Other Services       Precautions / Restrictions Precautions Precautions: None      Mobility  Bed Mobility Overal bed mobility: Independent                Transfers Overall transfer level: Needs assistance Equipment used: None Transfers: Sit to/from Stand Sit to Stand: Supervision            Ambulation/Gait Ambulation/Gait assistance: Supervision Ambulation Distance (Feet): 500 Feet Assistive device: None Gait Pattern/deviations: Wide base of support;Drifts right/left Gait velocity: 1.7 ft/sec   General Gait Details: Mild unsteadiness noted however no LOB even with higher level balance challenges.   Stairs            Wheelchair Mobility    Modified Rankin (Stroke Patients Only)       Balance Overall balance assessment: Needs assistance Sitting-balance support: No upper extremity supported;Feet unsupported Sitting balance-Leahy Scale: Normal     Standing balance support: No upper extremity supported;During functional activity Standing balance-Leahy Scale: Good       Tandem Stance - Right  Leg: 3 (LOB with support required to get into proper foot positioning.) Tandem Stance - Left Leg: 5 (LOB with support required to get into proper foot positioning.) Rhomberg - Eyes Opened: 30 Rhomberg - Eyes Closed: 30 High level balance activites: Direction changes;Turns;Sudden stops;Head turns High Level Balance Comments: No LOB noted with above balance activities during ambulation. Changes in gait speed performed during ambulation with no LOB.             Pertinent Vitals/Pain 0/10 pain. Sp02 decreased to 90% during ambulation on RA however improved to mid 90s post ambulation bout. Mild SOB present.    Home Living Family/patient expects to be discharged to:: Private residence Living Arrangements: Alone Available Help at Discharge: Family;Neighbor;Available PRN/intermittently (Daughter lives down the block and grandaughter lives across the street, "she watches me like a Engineer, drilling.") Type of Home: House Home Access: Level entry     Home Layout: One level Home Equipment: None      Prior Function Level of Independence: Independent         Comments: Pt reports her family drives her to the grocery store. She does not leave the house much. When she does go into the community, family is with her. Enjoys gardening.     Hand Dominance        Extremity/Trunk Assessment   Upper Extremity Assessment: Overall WFL for tasks assessed           Lower Extremity Assessment: Overall WFL for tasks assessed         Communication   Communication: No difficulties  Cognition Arousal/Alertness: Awake/alert Behavior During  Therapy: WFL for tasks assessed/performed Overall Cognitive Status: Within Functional Limits for tasks assessed                      General Comments      Exercises General Exercises - Lower Extremity Ankle Circles/Pumps: Both;20 reps;Seated Heel Raises: Both;15 reps;Standing (x3 sets with BUE support progressing to 1 UE support to no UE support for  last set.)      Assessment/Plan    PT Assessment Patient needs continued PT services  PT Diagnosis Difficulty walking   PT Problem List Decreased balance;Decreased activity tolerance  PT Treatment Interventions Balance training;Functional mobility training;Patient/family education;Therapeutic activities;Therapeutic exercise;Gait training   PT Goals (Current goals can be found in the Care Plan section) Acute Rehab PT Goals Patient Stated Goal: to go home. PT Goal Formulation: With patient Time For Goal Achievement: 02/08/14 Potential to Achieve Goals: Good    Frequency Min 3X/week   Barriers to discharge        Co-evaluation               End of Session Equipment Utilized During Treatment: Gait belt Activity Tolerance: Patient tolerated treatment well Patient left: in bed;with call bell/phone within reach;with family/visitor present Nurse Communication: Mobility status    Functional Assessment Tool Used: Clinical judgment Functional Limitation: Mobility: Walking and moving around Mobility: Walking and Moving Around Current Status (U1103): At least 1 percent but less than 20 percent impaired, limited or restricted Mobility: Walking and Moving Around Goal Status [phone]): 0 percent impaired, limited or restricted    Time: 1534-1557 PT Time Calculation (min): 23 min   Charges:   PT Evaluation $Initial PT Evaluation Tier I: 1 Procedure PT Treatments $Neuromuscular Re-education: 8-22 mins   PT G Codes:   Functional Assessment Tool Used: Clinical judgment Functional Limitation: Mobility: Walking and moving around    [place], [place] A 02/01/2014, 4:10 PM Candy Sledge, PT, DPT [phone]

## 2014-02-01 NOTE — Progress Notes (Signed)
Patient ID: Kimberly [name]  female  QPY:[id number]    DOB: Feb 15, 1928    DOA: 01/31/2014  PCP: Loura Pardon, MD  Assessment/Plan: Principal Problem:  Acute diastolic CHF (congestive heart failure) - Patient feeling better today, lisinopril shortness of breath, I/O's with negative balance  - Troponin 0.0, TSH 1.3, 2-D echo pending - Continue gentle diuresis  Active Problems:   HYPOTHYROIDISM - TSH 1.3, continue Synthroid    HYPERTENSION - Currently stable    Abdominal pain, acute, generalized: Abdominal pain improving, likely due to constipation, patient reports no significant bowel movement in last week. Per patient she has seen [facility] for the same and was recommended Metamucil and MiraLax however she stopped using them and she did not like the taste. - Placed on MiraLax and Colace, will attempt enema later today if no BM - Abdominal x-ray shows no obstruction    Hyponatremia - Improving likely due to hypovolemia  Hypokalemia: Potassium replaced   DVT Prophylaxis:  Code Status:  Family Communication: Discussed in detail with the patient and her daughter at the bedside  Disposition: Hopefully tomorrow Consultants:  None  Procedures:  2-D echo  Antibiotics:  None    Subjective: Patient seen and examined, denies any specific complaints at this time except intermittent abdominal discomfort, constipation  Objective: Weight change:   Intake/Output Summary (Last 24 hours) at 02/01/14 1207 Last data filed at 02/01/14 1011  Gross per 24 hour  Intake    840 ml  Output    850 ml  Net    -10 ml   Blood pressure 130/60, pulse 78, temperature 98.2 F (36.8 C), temperature source Oral, resp. rate 18, height 5' 3.5" (1.613 m), weight 81.647 kg (180 lb), last menstrual period 08/03/1978, SpO2 93.00%.  Physical Exam: General: Alert and awake, oriented x3, not in any acute distress. CVS: S1-S2 clear, no murmur rubs or gallops Chest: clear to auscultation  bilaterally, no wheezing, rales or rhonchi Abdomen: soft nontender, nondistended, normal bowel sounds  Extremities: no cyanosis, clubbing or edema noted bilaterally Neuro: Cranial nerves II-XII intact, no focal neurological deficits  Lab Results: Basic Metabolic Panel:  Recent Labs Lab 01/31/14 1137 02/01/14 0300  NA 127* 131*  K 3.4* 3.1*  CL 86* 89*  CO2 24 27  GLUCOSE 124* 94  BUN 9 11  CREATININE 0.75 1.01  CALCIUM 8.9 8.9   Liver Function Tests:  Recent Labs Lab 01/31/14 1137  AST 21  ALT 12  ALKPHOS 115  BILITOT 0.5  PROT 7.3  ALBUMIN 3.8   No results found for this basename: LIPASE, AMYLASE,  in the last 168 hours No results found for this basename: AMMONIA,  in the last 168 hours CBC:  Recent Labs Lab 01/31/14 1137  WBC 4.5  HGB 11.6*  HCT 34.4*  MCV 83.7  PLT 260   Cardiac Enzymes: No results found for this basename: CKTOTAL, CKMB, CKMBINDEX, TROPONINI,  in the last 168 hours BNP: No components found with this basename: POCBNP,  CBG: No results found for this basename: GLUCAP,  in the last 168 hours   Micro Results: No results found for this or any previous visit (from the past 240 hour(s)).  Studies/Results: Dg Chest 2 View  01/31/2014   CLINICAL DATA:  Shortness of breath and pain  EXAM: CHEST  2 VIEW  COMPARISON:  May 29, 2013  FINDINGS: There is stable eventration of the right hemidiaphragm. There is no edema or consolidation. The heart size and pulmonary vascularity  are normal. No adenopathy. There is atherosclerotic change in the aorta.  IMPRESSION: No edema or consolidation.   Electronically Signed   By: Lowella Grip M.D.   On: 01/31/2014 11:07   Dg Abd Portable 1v  02/01/2014   CLINICAL DATA:  Abdominal pain  EXAM: PORTABLE ABDOMEN - 1 VIEW  COMPARISON:  None.  FINDINGS: Normal bowel gas pattern.  Calcification left pelvis is likely within a fibroid. There are aortic vascular calcifications. Soft tissues are otherwise unremarkable.   Degenerative changes are noted along the visible spine. Bones are demineralized.  IMPRESSION: No acute findings.  No obstruction.   Electronically Signed   By: Lajean Manes M.D.   On: 02/01/2014 08:13    Medications: Scheduled Meds: . amLODipine  5 mg Oral QHS  . aspirin  81 mg Oral Daily  . docusate sodium  100 mg Oral BID  . enoxaparin (LOVENOX) injection  40 mg Subcutaneous Q24H  . fluticasone  2 spray Each Nare Daily  . furosemide  20 mg Oral BID  . levothyroxine  25 mcg Oral QAC breakfast  . losartan  100 mg Oral Daily  . metoprolol tartrate  50 mg Oral BID  . pantoprazole  80 mg Oral Q1200  . polyethylene glycol  17 g Oral Once  . potassium chloride  40 mEq Oral Daily  . sodium chloride  3 mL Intravenous Q12H      LOS: 1 day   Haileyann Staiger M.D. [facility] 02/01/2014, 12:07 PM Pager: [phone]  If 7PM-7AM, please contact night-coverage [email or web address] Password [email or web address]  **Disclaimer: This note was dictated with voice recognition software. Similar sounding words can inadvertently be transcribed and this note may contain transcription errors which may not have been corrected upon publication of note.**

## 2014-02-01 NOTE — Progress Notes (Signed)
  Echocardiogram 2D Echocardiogram has been performed.  Kimberly [name] FRANCES 02/01/2014, 12:16 PM

## 2014-02-02 DIAGNOSIS — F411 Generalized anxiety disorder: Secondary | ICD-10-CM

## 2014-02-02 LAB — BASIC METABOLIC PANEL
Anion gap: 18 — ABNORMAL HIGH (ref 5–15)
BUN: 17 mg/dL (ref 6–23)
CHLORIDE: 86 meq/L — AB (ref 96–112)
CO2: 25 meq/L (ref 19–32)
Calcium: 8.7 mg/dL (ref 8.4–10.5)
Creatinine, Ser: 1.27 mg/dL — ABNORMAL HIGH (ref 0.50–1.10)
GFR calc non Af Amer: 38 mL/min — ABNORMAL LOW (ref >90)
GFR, EST AFRICAN AMERICAN: 44 mL/min — AB (ref >90)
Glucose, Bld: 102 mg/dL — ABNORMAL HIGH (ref 70–99)
POTASSIUM: 3.4 meq/L — AB (ref 3.7–5.3)
Sodium: 129 mEq/L — ABNORMAL LOW (ref 137–147)

## 2014-02-02 MED ORDER — DSS 100 MG PO CAPS: 100.0000 mg | ORAL_CAPSULE | Freq: Two times a day (BID) | ORAL | Status: AC

## 2014-02-02 MED ORDER — POTASSIUM CHLORIDE CRYS ER 20 MEQ PO TBCR: 20.0000 meq | EXTENDED_RELEASE_TABLET | Freq: Every day | ORAL | Status: DC

## 2014-02-02 MED ORDER — FUROSEMIDE 20 MG PO TABS: 20.0000 mg | ORAL_TABLET | Freq: Every day | ORAL | Status: DC

## 2014-02-02 MED ORDER — FUROSEMIDE 20 MG PO TABS
20.0000 mg | ORAL_TABLET | Freq: Every day | ORAL | Status: DC
  Filled 2014-02-02: qty 1

## 2014-02-02 NOTE — Progress Notes (Signed)
UR Completed.  Lashe Oliveira Jane [phone] 02/02/2014  

## 2014-02-02 NOTE — Discharge Summary (Signed)
Physician Discharge Summary  Patient ID: DERHONDA EASTLICK MRN: [id number] DOB/AGE: 1928/02/04 80 y.o.  Admit date: 01/31/2014 Discharge date: 02/02/2014  Primary Care Physician:  Loura Pardon, MD  Discharge Diagnoses:   . Acute diastolic CHF (congestive heart failure) . Abdominal pain, acute, generalized resolved likely due to constipation   . Hypokalemia  . HYPERTENSION . HYPOTHYROIDISM . Obesity . Hyponatremia . Unspecified constipation   Consults:  None   Recommendations for Outpatient Follow-up:  Please note patient is started on low dose of Lasix 20 mg daily with potassium replacement, please check labs for renal function, potassium and sodium at the time of follow up appointment.    Allergies:   Allergies  Allergen Reactions  . Cephalexin     REACTION: rash and burning sensation to skin on arms and legs with itching.  . Ciprofloxacin     REACTION: burning of mouth and skin  . Elavil [Amitriptyline]     palpitation  . Lisinopril     REACTION: cough  . Penicillins     REACTION: rash  . Remeron [Mirtazapine]     constipation  . Sulfonamide Derivatives     REACTION: malaise  . Tetanus Toxoid     REACTION: swelling  . Vesicare [Solifenacin Succinate] Other (See Comments)    Blurred vision and weakness  . Zoloft [Sertraline Hcl]     GI upset and nausea     Discharge Medications:   Medication List    STOP taking these medications       hydrochlorothiazide 25 MG tablet  Commonly known as:  HYDRODIURIL      TAKE these medications       amLODipine 5 MG tablet  Commonly known as:  NORVASC  Take 5 mg by mouth at bedtime.     aspirin 81 MG tablet  Take 81 mg by mouth daily.     cetirizine 10 MG tablet  Commonly known as:  ZYRTEC  Take 10 mg by mouth daily as needed for allergies.     DSS 100 MG Caps  Take 100 mg by mouth 2 (two) times daily. For constipation     esomeprazole 40 MG capsule  Commonly known as:  NEXIUM  TAKE 1 CAPSULE DAILY     fluticasone 50 MCG/ACT nasal spray  Commonly known as:  FLONASE  Place 2 sprays into both nostrils daily.     furosemide 20 MG tablet  Commonly known as:  LASIX  Take 1 tablet (20 mg total) by mouth daily.  Start taking on:  02/03/2014     levothyroxine 25 MCG tablet  Commonly known as:  SYNTHROID, LEVOTHROID  TAKE 1 TABLET BY MOUTH ONCE A DAY     losartan 100 MG tablet  Commonly known as:  COZAAR  Take 1 tablet (100 mg total) by mouth daily.     metoprolol 50 MG tablet  Commonly known as:  LOPRESSOR  TAKE 1 TABLET TWICE A DAY     MUCINEX 600 MG 12 hr tablet  Generic drug:  guaiFENesin  Take 600 mg by mouth 2 (two) times daily as needed for cough or to loosen phlegm.     multivitamin capsule  Take 1 capsule by mouth daily.     potassium chloride SA 20 MEQ tablet  Commonly known as:  K-DUR,KLOR-CON  Take 1 tablet (20 mEq total) by mouth daily.     zolpidem 10 MG tablet  Commonly known as:  AMBIEN  TAKE 1/2-1 TABLET BY MOUTH AT BEDTIME AS  NEEDED         Brief H and P: For complete details please refer to admission H and P, but in brief Kimberly [name] is a 80 y.o. female  With past medical history of hypothyroidism and hypertension who had noted over the last week that she's been having increased problems with dyspnea on exertion, cramping abdominal pain and overall feeling of malaise and poor appetite. She continued to worsen she came into the emergency room today for further evaluation. Her chest x-ray in the emergency room was unremarkable, however a BNP was noted to be elevated at 1900. Patient has a previous history of heart failure. She is also noted to have a slightly low sodium level of 127. The rest of her lab work was unremarkable   Hospital Course:   Acute diastolic CHF (congestive heart failure) : Improving Patient was admitted to telemetry, ruled out for acute ACS. BNP was elevated at 1965. The patient was placed on CHF protocol, started on low dose of Lasix 20mg   BID for diuresis. Troponins were negative. TSH 1.3.  2-D echo was obtained which showed EF of 55% with grade 1 diastolic dysfunction , no regional wall motion abnormalities. Her symptoms have significantly improved, with no dyspnea on exertion, trace peripheral edema, Lasix dose was decreased to 20 mg daily with potassium replacement. Patient was recommended to followup with her primary care physician to obtain labs for renal function, sodium and potassium. This was also explained to the patient's son at the bedside.   HYPOTHYROIDISM - TSH 1.3, continue Synthroid   HYPERTENSION - Currently stable   Abdominal pain, acute, generalized: resolved, likely due to constipation. Patient reported no significant bowel movement in the last week prior to admission.  Per patient she has seen [facility] for the same and was recommended Metamucil and MiraLax however she stopped using them as she did not like the taste. Patient was placed on Colace and given one Fleet enema. Patient had a good bowel movement and resolution of her symptoms. Abdominal x-ray shows no obstruction   Hyponatremia Patient presented with a sodium of 127, appears to be chronically low, she is alert and oriented, it improved to 131 with Lasix, however then trended down to 129 at discharge. This will need to be monitored closely with Lasix.   Hypokalemia: Potassium replaced    Day of Discharge BP 134/46  Pulse 58  Temp(Src) 98.5 F (36.9 C) (Oral)  Resp 18  Ht 5' 3.5" (1.613 m)  Wt 82.438 kg (181 lb 11.9 oz)  BMI 31.69 kg/m2  SpO2 91%  LMP 08/03/1978  Physical Exam: General: Alert and awake oriented x3 not in any acute distress. CVS: S1-S2 clear no murmur rubs or gallops Chest: clear to auscultation bilaterally, no wheezing rales or rhonchi Abdomen: soft nontender, nondistended, normal bowel sounds Extremities: no cyanosis, clubbing, trace edema noted bilaterally Neuro: Cranial nerves II-XII intact, no focal  neurological deficits   The results of significant diagnostics from this hospitalization (including imaging, microbiology, ancillary and laboratory) are listed below for reference.    LAB RESULTS: Basic Metabolic Panel:  Recent Labs Lab 02/01/14 0300 02/02/14 0425  NA 131* 129*  K 3.1* 3.4*  CL 89* 86*  CO2 27 25  GLUCOSE 94 102*  BUN 11 17  CREATININE 1.01 1.27*  CALCIUM 8.9 8.7   Liver Function Tests:  Recent Labs Lab 01/31/14 1137  AST 21  ALT 12  ALKPHOS 115  BILITOT 0.5  PROT 7.3  ALBUMIN  3.8   No results found for this basename: LIPASE, AMYLASE,  in the last 168 hours No results found for this basename: AMMONIA,  in the last 168 hours CBC:  Recent Labs Lab 01/31/14 1137  WBC 4.5  HGB 11.6*  HCT 34.4*  MCV 83.7  PLT 260   Cardiac Enzymes: No results found for this basename: CKTOTAL, CKMB, CKMBINDEX, TROPONINI,  in the last 168 hours BNP: No components found with this basename: POCBNP,  CBG: No results found for this basename: GLUCAP,  in the last 168 hours  Significant Diagnostic Studies:  Dg Chest 2 View  01/31/2014   CLINICAL DATA:  Shortness of breath and pain  EXAM: CHEST  2 VIEW  COMPARISON:  May 29, 2013  FINDINGS: There is stable eventration of the right hemidiaphragm. There is no edema or consolidation. The heart size and pulmonary vascularity are normal. No adenopathy. There is atherosclerotic change in the aorta.  IMPRESSION: No edema or consolidation.   Electronically Signed   By: Lowella Grip M.D.   On: 01/31/2014 11:07   Dg Abd Portable 1v  02/01/2014   CLINICAL DATA:  Abdominal pain  EXAM: PORTABLE ABDOMEN - 1 VIEW  COMPARISON:  None.  FINDINGS: Normal bowel gas pattern.  Calcification left pelvis is likely within a fibroid. There are aortic vascular calcifications. Soft tissues are otherwise unremarkable.  Degenerative changes are noted along the visible spine. Bones are demineralized.  IMPRESSION: No acute findings.  No  obstruction.   Electronically Signed   By: Lajean Manes M.D.   On: 02/01/2014 08:13    2D ECHO: Study Conclusions  - Left ventricle: The cavity size was normal. There was mild focal basal and mild concentric hypertrophy of the septum. Systolic function was normal. The estimated ejection fraction was in the range of 50% to 55%. Wall motion was normal; there were no regional wall motion abnormalities. Doppler parameters are consistent with abnormal left ventricular relaxation (grade 1 diastolic dysfunction). - Aortic valve: There was trivial regurgitation. - Mitral valve: Calcified annulus. There was mild regurgitation. - Left atrium: The atrium was mildly dilated.     Disposition and Follow-up:     Discharge Instructions   Diet - low sodium heart healthy    Complete by:  As directed      Discharge instructions    Complete by:  As directed   Please note that you have been started on Lasix. If you feel dizzy or your SBP is low (less than 100), then hold the dose. Please see your primary physician in 7-10 days to check labs (for kidney function, potassium).     Increase activity slowly    Complete by:  As directed             DISPOSITION:  home  DIET: Heart healthy diet    TESTS THAT NEED FOLLOW-UP  BMET   DISCHARGE FOLLOW-UP Follow-up Information   Follow up with Loura Pardon, MD. Schedule an appointment as soon as possible for a visit in 10 days. (for hospital follow-up, obtain labs (for renal function, potassium))    Specialties:  Family Medicine, Radiology   Contact information:   [address] [place] [address] [phone]       Time spent on Discharge:  35 minutes   Signed:   Brytani Voth M.D. [facility] 02/02/2014, 8:46 AM Pager: [phone]   **Disclaimer: This note was dictated with voice recognition software. Similar sounding words can inadvertently be  transcribed and this note may contain transcription  errors which may not have been corrected upon publication of note.**

## 2014-02-02 NOTE — Progress Notes (Signed)
Pt discharged to home per MD order. Pt received and reviewed all discharge instructions and medication information including follow-up appointments and prescription information. Pt verbalized understanding. Pt alert and oriented at discharge. Pt IV and telemetry box removed prior to discharge. Pt escorted to private vehicle via wheelchair by nurse tech. Lenna Sciara

## 2014-02-06 ENCOUNTER — Telehealth: Payer: Self-pay | Admitting: Family Medicine

## 2014-02-06 NOTE — Telephone Encounter (Signed)
Transitional Care Call.  Pt d/c'd from hospital on 02/02/14.  D/C dx: Acute diastolic CHF (congestive heart failure), Abdominal pain, acute, generalized resolved likely due to constipation, Hypokalemia, HYPERTENSION, HYPOTHYROIDISM, Obesity, Hyponatremia and Unspecified constipation.  Spoke with pt.  She says that she is feeling much better and is trying to get her strength back.  She lives alone but her son has been staying with her since d/c to assist her as needed.  She ambulates and performs ADLs independently.  Appetite is good.  No chest pain or SOB.  Afebrile.  Denies questions for Dr. Glori Bickers at this time.  Denies questions r/t d/c instructions or med list.  Hospital follow-up scheduled for 02/12/14.

## 2014-02-12 ENCOUNTER — Ambulatory Visit (INDEPENDENT_AMBULATORY_CARE_PROVIDER_SITE_OTHER): Payer: Medicare Other | Admitting: Family Medicine

## 2014-02-12 ENCOUNTER — Encounter: Payer: Self-pay | Admitting: Family Medicine

## 2014-02-12 VITALS — BP 142/80 | HR 68 | Temp 98.2°F | Ht 63.5 in | Wt 180.0 lb

## 2014-02-12 DIAGNOSIS — I10 Essential (primary) hypertension: Secondary | ICD-10-CM

## 2014-02-12 DIAGNOSIS — I5031 Acute diastolic (congestive) heart failure: Secondary | ICD-10-CM

## 2014-02-12 DIAGNOSIS — K5900 Constipation, unspecified: Secondary | ICD-10-CM

## 2014-02-12 DIAGNOSIS — E871 Hypo-osmolality and hyponatremia: Secondary | ICD-10-CM

## 2014-02-12 LAB — COMPREHENSIVE METABOLIC PANEL
ALBUMIN: 4 g/dL (ref 3.5–5.2)
ALT: 16 U/L (ref 0–35)
AST: 19 U/L (ref 0–37)
Alkaline Phosphatase: 93 U/L (ref 39–117)
BUN: 19 mg/dL (ref 6–23)
CALCIUM: 8.9 mg/dL (ref 8.4–10.5)
CO2: 27 meq/L (ref 19–32)
Chloride: 97 mEq/L (ref 96–112)
Creatinine, Ser: 1.1 mg/dL (ref 0.4–1.2)
GFR: 51.9 mL/min — AB (ref >60.00)
Glucose, Bld: 114 mg/dL — ABNORMAL HIGH (ref 70–99)
POTASSIUM: 4.7 meq/L (ref 3.5–5.1)
Sodium: 134 mEq/L — ABNORMAL LOW (ref 135–145)
Total Bilirubin: 0.4 mg/dL (ref 0.2–1.2)
Total Protein: 7.7 g/dL (ref 6.0–8.3)

## 2014-02-12 NOTE — Progress Notes (Signed)
Pre visit review using our clinic review tool, if applicable. No additional management support is needed unless otherwise documented below in the visit note. 

## 2014-02-12 NOTE — Assessment & Plan Note (Signed)
She pred colace to miralax Disc imp of fiber and fluids also

## 2014-02-12 NOTE — Assessment & Plan Note (Signed)
Lab today She stays baseline low -but now with furosemide we need to watch more closely

## 2014-02-12 NOTE — Progress Notes (Signed)
Subjective:    Patient ID: Kimberly [name], female    DOB: 06/02/1928, 80 y.o.   MRN: [id number]  HPI Here for f/u hosp 7/1-7/3  hosp primarily for CHF BNP 1900 , was r/o for MI Echo showed EG 55%  Grade 1 DD  ? Hx of CAD- pt cannot remember  She does not see cardiologist regularly  (per pt in [facility])-retired   Also had constipation  Fleet enema and also colace helped a lot   Now on lasix 20 mg daily  Per pt she is better  Sob not as bad  No sob with walking  No orthopnea or PND  No CP  Overall she is gaining strength back  Still nervous/anxious after hosp-also getting better  Patient Active Problem List   Diagnosis Date Noted  . Abdominal pain, acute, generalized 01/31/2014  . Acute diastolic heart failure 28/78/6767  . Hyponatremia 01/31/2014  . Unspecified constipation 01/31/2014  . Acute diastolic CHF (congestive heart failure) 01/31/2014  . Acute bacterial sinusitis 10/13/2013  . Thrush, oral 03/21/2013  . UTI (urinary tract infection) 12/13/2012  . Left thyroid nodule 08/04/2012  . Obesity 12/04/2011  . Gastritis 12/04/2011  . Heart palpitations 09/01/2011  . Depression 05/26/2011  . Frequent UTI 12/30/2010  . Lower abdominal pain 11/27/2010  . Dysuria 11/26/2010  . COLONIC POLYPS 04/09/2010  . ANEMIA, MILD 04/09/2010  . HEMORRHOIDS 03/04/2010  . ANXIETY 12/10/2009  . PERSONAL HX COLONIC POLYPS 06/19/2008  . IRRITABLE BOWEL SYNDROME 03/28/2008  . 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 GOITER 03/29/2007  . HYPOTHYROIDISM 03/29/2007  . HYPERLIPIDEMIA 03/29/2007  . HYPERTENSION 03/29/2007  . CORONARY ARTERY DISEASE 03/29/2007  . BUNDLE BRANCH BLOCK 03/29/2007  . ALLERGIC RHINITIS 03/29/2007  . GERD 03/29/2007  . HYPERGLYCEMIA 03/29/2007   Past Medical History  Diagnosis Date  . Allergy     allergic rhinitis  . CAD (coronary artery disease)   . GERD (gastroesophageal reflux disease)   . Hyperlipidemia   . Hypertension   . Hypothyroid    . Insomnia    Past Surgical History  Procedure Laterality Date  . Oophorectomy    . Thyroid surgery     History  Substance Use Topics  . Smoking status: Never Smoker   . Smokeless tobacco: Not on file  . Alcohol Use: No   Family History  Problem Relation Age of Onset  . Heart disease Mother   . Hypertension Mother    Allergies  Allergen Reactions  . Cephalexin     REACTION: rash and burning sensation to skin on arms and legs with itching.  . Ciprofloxacin     REACTION: burning of mouth and skin  . Elavil [Amitriptyline]     palpitation  . Lisinopril     REACTION: cough  . Penicillins     REACTION: rash  . Remeron [Mirtazapine]     constipation  . Sulfonamide Derivatives     REACTION: malaise  . Tetanus Toxoid     REACTION: swelling  . Vesicare [Solifenacin Succinate] Other (See Comments)    Blurred vision and weakness  . Zoloft [Sertraline Hcl]     GI upset and nausea   Current Outpatient Prescriptions on File Prior to Visit  Medication Sig Dispense Refill  . amLODipine (NORVASC) 5 MG tablet Take 5 mg by mouth at bedtime.      Marland Kitchen aspirin 81 MG tablet Take 81 mg by mouth daily.        . cetirizine (ZYRTEC)  10 MG tablet Take 10 mg by mouth daily as needed for allergies.       Marland Kitchen docusate sodium 100 MG CAPS Take 100 mg by mouth 2 (two) times daily. For constipation  60 capsule  3  . esomeprazole (NEXIUM) 40 MG capsule TAKE 1 CAPSULE DAILY  90 capsule  3  . fluticasone (FLONASE) 50 MCG/ACT nasal spray Place 2 sprays into both nostrils daily.  48 g  3  . furosemide (LASIX) 20 MG tablet Take 1 tablet (20 mg total) by mouth daily.  30 tablet  3  . guaiFENesin (MUCINEX) 600 MG 12 hr tablet Take 600 mg by mouth 2 (two) times daily as needed for cough or to loosen phlegm.       Marland Kitchen levothyroxine (SYNTHROID, LEVOTHROID) 25 MCG tablet TAKE 1 TABLET BY MOUTH ONCE A DAY  30 tablet  5  . losartan (COZAAR) 100 MG tablet Take 1 tablet (100 mg total) by mouth daily.  90 tablet  3  .  metoprolol (LOPRESSOR) 50 MG tablet TAKE 1 TABLET TWICE A DAY  180 tablet  3  . Multiple Vitamin (MULTIVITAMIN) capsule Take 1 capsule by mouth daily.        . potassium chloride SA (K-DUR,KLOR-CON) 20 MEQ tablet Take 1 tablet (20 mEq total) by mouth daily.  30 tablet  3  . zolpidem (AMBIEN) 10 MG tablet TAKE 1/2-1 TABLET BY MOUTH AT BEDTIME AS NEEDED  30 tablet  5   No current facility-administered medications on file prior to visit.    Review of Systems    Review of Systems  Constitutional: Negative for fever, appetite change,  and unexpected weight change. pos for fatigue  Eyes: Negative for pain and visual disturbance.  Respiratory: Negative for cough and shortness of breath.   Cardiovascular: Negative for cp or palpitations  Neg for PND or orthopnea   Gastrointestinal: Negative for nausea, diarrhea and pos for chronic  constipation.  Genitourinary: Negative for urgency and frequency.  Skin: Negative for pallor or rash   MSK neg for muscle cramping Neurological: Negative for weakness, light-headedness, numbness and headaches.  Hematological: Negative for adenopathy. Does not bruise/bleed easily.  Psychiatric/Behavioral: Negative for dysphoric mood. The patient is occas nervous/anxious.      Objective:   Physical Exam  Constitutional: She appears well-developed and well-nourished. No distress.  obese and well appearing   HENT:  Head: Normocephalic and atraumatic.  Mouth/Throat: Oropharynx is clear and moist.  Eyes: Conjunctivae and EOM are normal. Pupils are equal, round, and reactive to light. No scleral icterus.  Neck: Normal range of motion. Neck supple. No JVD present. Carotid bruit is not present. No thyromegaly present.  Cardiovascular: Normal rate, regular rhythm, normal heart sounds and intact distal pulses.  Exam reveals no gallop.   Pulmonary/Chest: Effort normal and breath sounds normal. No respiratory distress. She has no wheezes. She has no rales.  No crackles     Abdominal: Soft. Bowel sounds are normal. She exhibits no distension and no mass. There is no tenderness.  Musculoskeletal: She exhibits no edema and no tenderness.  Lymphadenopathy:    She has no cervical adenopathy.  Neurological: She is alert. She has normal reflexes. No cranial nerve deficit. She exhibits normal muscle tone. Coordination normal.  Skin: Skin is warm and dry. No rash noted. No erythema. No pallor.  Psychiatric: Her speech is normal and behavior is normal. Thought content normal. Her mood appears anxious. Her affect is not blunt, not labile and not  inappropriate. Cognition and memory are normal.  Mildly anxious - but pleasant and talkative           Assessment & Plan:   Problem List Items Addressed This Visit     Cardiovascular and Mediastinum   HYPERTENSION - Primary      bp in fair control at this time  BP Readings from Last 1 Encounters:  02/12/14 142/80   No changes needed Disc lifstyle change with low sodium diet and exercise     BP Readings from Last 3 Encounters:  02/12/14 142/80  02/02/14 136/56  10/13/13 130/78       Relevant Medications      hydrochlorothiazide (HYDRODIURIL) 25 MG tablet   Other Relevant Orders      Comprehensive metabolic panel (Completed)   Acute diastolic CHF (congestive heart failure)     Recent hosp for diastolic dysfunction - rev hosp records and studies in detail  Symptoms are better on lasix with K suppl Disc watching sodium in diet and gradually adv activity as tolerated  Will ref to cardiol to get est again (remote hx of CAD and has not been seen in a long time) Reassuring exam     Relevant Medications      hydrochlorothiazide (HYDRODIURIL) 25 MG tablet   Other Relevant Orders      Comprehensive metabolic panel (Completed)      Ambulatory referral to Cardiology     Digestive   Unspecified constipation (Chronic)     She pred colace to miralax Disc imp of fiber and fluids also       Other   Hyponatremia      Lab today She stays baseline low -but now with furosemide we need to watch more closely    Relevant Orders      Comprehensive metabolic panel (Completed)

## 2014-02-12 NOTE — Assessment & Plan Note (Signed)
Recent hosp for diastolic dysfunction - rev hosp records and studies in detail  Symptoms are better on lasix with K suppl Disc watching sodium in diet and gradually adv activity as tolerated  Will ref to cardiol to get est again (remote hx of CAD and has not been seen in a long time) Reassuring exam

## 2014-02-12 NOTE — Patient Instructions (Signed)
Gradually advance activity day by day to get your energy level back  Continue colace if it helps Labs today  Stop at check out so we can get you set up with a cardiologist

## 2014-02-12 NOTE — Assessment & Plan Note (Signed)
bp in fair control at this time  BP Readings from Last 1 Encounters:  02/12/14 142/80   No changes needed Disc lifstyle change with low sodium diet and exercise     BP Readings from Last 3 Encounters:  02/12/14 142/80  02/02/14 136/56  10/13/13 130/78

## 2014-02-13 ENCOUNTER — Telehealth: Payer: Self-pay | Admitting: Family Medicine

## 2014-02-13 ENCOUNTER — Encounter: Payer: Self-pay | Admitting: *Deleted

## 2014-02-13 NOTE — Telephone Encounter (Signed)
Relevant patient education mailed to patient.  

## 2014-02-22 ENCOUNTER — Ambulatory Visit (INDEPENDENT_AMBULATORY_CARE_PROVIDER_SITE_OTHER): Payer: Medicare Other | Admitting: Cardiovascular Disease

## 2014-02-22 ENCOUNTER — Encounter: Payer: Self-pay | Admitting: Cardiovascular Disease

## 2014-02-22 VITALS — BP 153/79 | HR 68 | Ht 63.0 in | Wt 182.0 lb

## 2014-02-22 DIAGNOSIS — R0602 Shortness of breath: Secondary | ICD-10-CM

## 2014-02-22 DIAGNOSIS — I454 Nonspecific intraventricular block: Secondary | ICD-10-CM

## 2014-02-22 DIAGNOSIS — I2510 Atherosclerotic heart disease of native coronary artery without angina pectoris: Secondary | ICD-10-CM

## 2014-02-22 DIAGNOSIS — I509 Heart failure, unspecified: Secondary | ICD-10-CM

## 2014-02-22 DIAGNOSIS — E669 Obesity, unspecified: Secondary | ICD-10-CM

## 2014-02-22 DIAGNOSIS — I10 Essential (primary) hypertension: Secondary | ICD-10-CM

## 2014-02-22 DIAGNOSIS — I5031 Acute diastolic (congestive) heart failure: Secondary | ICD-10-CM

## 2014-02-22 MED ORDER — AMLODIPINE BESYLATE 10 MG PO TABS: 10.0000 mg | ORAL_TABLET | Freq: Every day | ORAL | Status: DC

## 2014-02-22 NOTE — Assessment & Plan Note (Signed)
We have encouraged continued exercise, careful diet management in an effort to lose weight. 

## 2014-02-22 NOTE — Assessment & Plan Note (Signed)
Minimal coronary artery disease by prior catheterization. Currently with no symptoms concerning for angina. We'll hold off on ischemia workup

## 2014-02-22 NOTE — Assessment & Plan Note (Signed)
Left bundle branch block dating back to early 2013

## 2014-02-22 NOTE — Progress Notes (Signed)
  HPI Comments: Kimberly [name] is a pleasant 80 year old woman with prior history of chest pain, cardiac catheterization in 2001 and2006 with no significant CAD,negative stress test in may 2011, with recent admission to [facility] July 1 with discharge 07/37/1062 with diastolic CHF, acute.  She reports that for 3 weeks prior to her admission she had worsening ankle swelling, shortness of breath symptoms. She was drinking significant water at home. She was not monitoring her weight. She woke up the morning of 01/31/2014 with tightness in her chest, shortness of breath. She had significant diuresis in the hospital. BNP 1900, cardiac enzymes negative for MI Echocardiogram showed normal LV systolic function, diastolic dysfunction noted  She was discharged on Lasix 20 mg daily. Weight when she left the hospital was 178 to 180 pounds She reports that her weight continues to be within this range. She's cut back on her fluids daily and is taking Lasix 20 mg daily  She denies having any significant lower extremity edema Her blood pressure has been running out on a consistent basis, typically [id number] systolic She states that she's not sleeping well. Fatigue since she left the hospital  EKG today shows normal sinus rhythm with rate 68 beats per minute, left bundle branch block    Review of Systems  Constitutional: Positive for fatigue.  HENT: Negative.   Eyes: Negative.   Respiratory: Negative.   Cardiovascular: Negative.   Gastrointestinal: Negative.   Endocrine: Negative.   Musculoskeletal: Negative.   Skin: Negative.   Allergic/Immunologic: Negative.   Neurological: Negative.   Hematological: Negative.   Psychiatric/Behavioral: Negative.   All other systems reviewed and are negative.    Objective:   Physical Exam  Constitutional: She appears well-developed and well-nourished. No distress.  obese and well appearing   HENT:  Head: Normocephalic and atraumatic.  Mouth/Throat: Oropharynx is  clear and moist.  Eyes: Conjunctivae and EOM are normal. Pupils are equal, round, and reactive to light. No scleral icterus.  Neck: Normal range of motion. Neck supple. No JVD present. Carotid bruit is not present. No thyromegaly present.  Cardiovascular: Normal rate, regular rhythm, normal heart sounds and intact distal pulses.  Exam reveals no gallop.   Pulmonary/Chest: Effort normal and breath sounds normal. No respiratory distress. She has no wheezes. She has no rales.  No crackles   Abdominal: Soft. Bowel sounds are normal. She exhibits no distension and no mass. There is no tenderness.  Musculoskeletal: She exhibits no edema and no tenderness.  Lymphadenopathy:    She has no cervical adenopathy.  Neurological: She is alert. She has normal reflexes. No cranial nerve deficit. She exhibits normal muscle tone. Coordination normal.  Skin: Skin is warm and dry. No rash noted. No erythema. No pallor.  Psychiatric: Her speech is normal and behavior is normal. Thought content normal. Her mood appears anxious. Her affect is not blunt, not labile and not inappropriate. Cognition and memory are normal.  Mildly anxious - but pleasant and talkative

## 2014-02-22 NOTE — Assessment & Plan Note (Signed)
We have recommended she stay on Lasix 20 mg daily. Goal weight 178 up to 180 pounds here she will monitor her weight at home. We have recommended extra Lasix for more than 3 pound weight gain

## 2014-02-22 NOTE — Patient Instructions (Addendum)
You are doing well.  Please try to keep your weight between 178 and 180 pounds Cut the potassium in 1/2 daily (when you take lasix)  Call the office if your weight goes too high or too low  Please increase the amlodipine up to 10 mg a day for blood pressure  Please call us if you have new issues that need to be addressed before your next appt.  Your physician wants you to follow-up in: 3 months.  You will receive a reminder letter in the mail two months in advance. If you don't receive a letter, please call our office to schedule the follow-up appointment.

## 2014-02-22 NOTE — Assessment & Plan Note (Signed)
Blood pressure continues to run high at home. We have suggested she increase the amlodipine to 10 mg daily. Recommended she call our office if systolic pressures continue to run high or if she develops significant leg edema

## 2014-03-01 ENCOUNTER — Other Ambulatory Visit: Payer: Self-pay | Admitting: Family Medicine

## 2014-03-03 DIAGNOSIS — K5900 Constipation, unspecified: Secondary | ICD-10-CM

## 2014-03-03 DIAGNOSIS — I5031 Acute diastolic (congestive) heart failure: Secondary | ICD-10-CM

## 2014-03-03 DIAGNOSIS — E871 Hypo-osmolality and hyponatremia: Secondary | ICD-10-CM

## 2014-03-03 DIAGNOSIS — I509 Heart failure, unspecified: Secondary | ICD-10-CM

## 2014-04-11 ENCOUNTER — Encounter: Payer: Self-pay | Admitting: Gastroenterology

## 2014-04-14 ENCOUNTER — Other Ambulatory Visit: Payer: Self-pay | Admitting: Family Medicine

## 2014-04-20 ENCOUNTER — Other Ambulatory Visit: Payer: Self-pay | Admitting: Family Medicine

## 2014-04-24 ENCOUNTER — Other Ambulatory Visit: Payer: Self-pay | Admitting: Family Medicine

## 2014-04-24 NOTE — Telephone Encounter (Signed)
Rx called in as prescribed 

## 2014-04-24 NOTE — Telephone Encounter (Signed)
Px written for call in   

## 2014-04-24 NOTE — Telephone Encounter (Signed)
Electronic refill request, please advise  

## 2014-04-25 ENCOUNTER — Other Ambulatory Visit: Payer: Self-pay

## 2014-04-25 MED ORDER — ESOMEPRAZOLE MAGNESIUM 40 MG PO CPDR: DELAYED_RELEASE_CAPSULE | ORAL | Status: DC

## 2014-04-25 MED ORDER — LOSARTAN POTASSIUM 100 MG PO TABS: 100.0000 mg | ORAL_TABLET | Freq: Every day | ORAL | Status: DC

## 2014-04-25 MED ORDER — METOPROLOL TARTRATE 50 MG PO TABS: ORAL_TABLET | ORAL | Status: DC

## 2014-04-25 NOTE — Telephone Encounter (Signed)
Pt having problems getting med from [facility] and request 90 day rx for nexium, losartan and metoprolol sent to [facility]. Advised pt done. Pt said [facility] told her her refills with them were outdated and pt is not going to get any more refills from [facility].

## 2014-04-27 ENCOUNTER — Encounter: Payer: Self-pay | Admitting: Family Medicine

## 2014-04-27 ENCOUNTER — Ambulatory Visit (INDEPENDENT_AMBULATORY_CARE_PROVIDER_SITE_OTHER): Payer: Medicare Other | Admitting: Family Medicine

## 2014-04-27 ENCOUNTER — Ambulatory Visit (INDEPENDENT_AMBULATORY_CARE_PROVIDER_SITE_OTHER)
Admission: RE | Admit: 2014-04-27 | Discharge: 2014-04-27 | Disposition: A | Discharge status: Home / Self Care | Payer: Medicare Other | Source: Ambulatory Visit | Attending: Family Medicine | Admitting: Family Medicine

## 2014-04-27 VITALS — BP 130/70 | HR 68 | Temp 98.0°F | Ht 63.0 in | Wt 181.2 lb

## 2014-04-27 DIAGNOSIS — R300 Dysuria: Secondary | ICD-10-CM

## 2014-04-27 DIAGNOSIS — R071 Chest pain on breathing: Secondary | ICD-10-CM

## 2014-04-27 DIAGNOSIS — I2510 Atherosclerotic heart disease of native coronary artery without angina pectoris: Secondary | ICD-10-CM

## 2014-04-27 DIAGNOSIS — R0789 Other chest pain: Secondary | ICD-10-CM

## 2014-04-27 LAB — POCT URINALYSIS DIPSTICK
Blood, UA: NEGATIVE
Glucose, UA: NEGATIVE
KETONES UA: NEGATIVE
LEUKOCYTES UA: NEGATIVE
Nitrite, UA: NEGATIVE
Spec Grav, UA: 1.015
UROBILINOGEN UA: 0.2
pH, UA: 5.5

## 2014-04-27 NOTE — Patient Instructions (Signed)
Leave a  Urine specimen on the [place] out  Chest xray today  Drink lots of fluids  Use gentle heat on the painful area for 10 minutes at a time  We will update you with results  Call if symptoms worsen in the meantime

## 2014-04-27 NOTE — Progress Notes (Signed)
Subjective:    Patient ID: Kimberly [name], female    DOB: [birth year], 80 y.o.   MRN: [id number]  HPI Here with pain in ribs  Has some moles on her back also   Started 1-2 weeks ago  Both sides - back / ribs - not pleuritic Dull pain / sharp if she rolls over at night -cannot get comfortable Not on R side (left side is better)  No triggers  No comfortable positions   Off and on it burns to urinate- but this is common for her  Drinking lots of water   Nausea on and off No vomiting or abd pain  No change in bowels/ no blood in stool  No sob or cp  Is tired all the time -not new either    Patient Active Problem List   Diagnosis Date Noted  . Abdominal pain, acute, generalized 01/31/2014  . Acute diastolic heart failure 40/98/1191  . Hyponatremia 01/31/2014  . Unspecified constipation 01/31/2014  . Acute diastolic CHF (congestive heart failure) 01/31/2014  . Acute bacterial sinusitis 10/13/2013  . Thrush, oral 03/21/2013  . UTI (urinary tract infection) 12/13/2012  . Left thyroid nodule 08/04/2012  . Obesity 12/04/2011  . Gastritis 12/04/2011  . Heart palpitations 09/01/2011  . Depression 05/26/2011  . Frequent UTI 12/30/2010  . Lower abdominal pain 11/27/2010  . Dysuria 11/26/2010  . COLONIC POLYPS 04/09/2010  . ANEMIA, MILD 04/09/2010  . HEMORRHOIDS 03/04/2010  . ANXIETY 12/10/2009  . PERSONAL HX COLONIC POLYPS 06/19/2008  . IRRITABLE BOWEL SYNDROME 03/28/2008  . 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 GOITER 03/29/2007  . HYPOTHYROIDISM 03/29/2007  . HYPERLIPIDEMIA 03/29/2007  . HYPERTENSION 03/29/2007  . CORONARY ARTERY DISEASE 03/29/2007  . BUNDLE BRANCH BLOCK 03/29/2007  . ALLERGIC RHINITIS 03/29/2007  . GERD 03/29/2007  . HYPERGLYCEMIA 03/29/2007   Past Medical History  Diagnosis Date  . Allergy     allergic rhinitis  . CAD (coronary artery disease)   . GERD (gastroesophageal reflux disease)   . Hyperlipidemia   . Hypertension   .  Hypothyroid   . Insomnia   . CHF (congestive heart failure)    Past Surgical History  Procedure Laterality Date  . Oophorectomy    . Thyroid surgery    . Cardiac catheterization      [facility]  . Cardiac catheterization      [facility]   History  Substance Use Topics  . Smoking status: Never Smoker   . Smokeless tobacco: Not on file  . Alcohol Use: No   Family History  Problem Relation Age of Onset  . Heart disease Mother   . Hypertension Mother    Allergies  Allergen Reactions  . Cephalexin     REACTION: rash and burning sensation to skin on arms and legs with itching.  . Ciprofloxacin     REACTION: burning of mouth and skin  . Elavil [Amitriptyline]     palpitation  . Lisinopril     REACTION: cough  . Penicillins     REACTION: rash  . Remeron [Mirtazapine]     constipation  . Sulfonamide Derivatives     REACTION: malaise  . Tetanus Toxoid     REACTION: swelling  . Vesicare [Solifenacin Succinate] Other (See Comments)    Blurred vision and weakness  . Zoloft [Sertraline Hcl]     GI upset and nausea   Current Outpatient Prescriptions on File Prior to Visit  Medication Sig Dispense Refill  . amLODipine (NORVASC) 10 MG  tablet Take 1 tablet (10 mg total) by mouth at bedtime.  30 tablet  11  . aspirin 81 MG tablet Take 81 mg by mouth daily.        . cetirizine (ZYRTEC) 10 MG tablet Take 10 mg by mouth daily as needed for allergies.       Marland Kitchen docusate sodium 100 MG CAPS Take 100 mg by mouth 2 (two) times daily. For constipation  60 capsule  3  . esomeprazole (NEXIUM) 40 MG capsule TAKE 1 CAPSULE DAILY  90 capsule  1  . fluticasone (FLONASE) 50 MCG/ACT nasal spray Place 2 sprays into both nostrils daily.  48 g  3  . furosemide (LASIX) 20 MG tablet Take 1 tablet (20 mg total) by mouth daily.  30 tablet  3  . guaiFENesin (MUCINEX) 600 MG 12 hr tablet Take 600 mg by mouth 2 (two) times daily as needed for cough or to loosen phlegm.       . hydrochlorothiazide (HYDRODIURIL) 25 MG tablet  Take 0.5 tablets by mouth daily.      Marland Kitchen levothyroxine (SYNTHROID, LEVOTHROID) 25 MCG tablet TAKE 1 TABLET BY MOUTH ONCE A DAY  30 tablet  5  . losartan (COZAAR) 100 MG tablet Take 1 tablet (100 mg total) by mouth daily.  90 tablet  1  . metoprolol (LOPRESSOR) 50 MG tablet TAKE 1 TABLET TWICE A DAY  180 tablet  1  . Multiple Vitamin (MULTIVITAMIN) capsule Take 1 capsule by mouth daily.        . potassium chloride SA (K-DUR,KLOR-CON) 20 MEQ tablet Take 1 tablet (20 mEq total) by mouth daily.  30 tablet  3  . zolpidem (AMBIEN) 10 MG tablet TAKE 1/2 TO 1 TABLET BY MOUTH AT BEDTIME AS NEEDED  30 tablet  5   No current facility-administered medications on file prior to visit.     Review of Systems Review of Systems  Constitutional: Negative for fever, appetite change,  and unexpected weight change.  Eyes: Negative for pain and visual disturbance.  Respiratory: Negative for cough and shortness of breath.   Cardiovascular: Negative for palpitations  Pos for R lateral and pos chest wall pain and tenderness    Gastrointestinal: Negative for nausea, diarrhea and constipation.  Genitourinary: Negative for urgency and frequency.  Skin: Negative for pallor or rash  pos for moles/keratoses  Neurological: Negative for weakness, light-headedness, numbness and headaches.  Hematological: Negative for adenopathy. Does not bruise/bleed easily.  Psychiatric/Behavioral: Negative for dysphoric mood. The patient is not nervous/anxious.         Objective:   Physical Exam  Constitutional: She appears well-developed and well-nourished. No distress.  HENT:  Head: Normocephalic and atraumatic.  Mouth/Throat: Oropharynx is clear and moist.  Eyes: Conjunctivae and EOM are normal. Pupils are equal, round, and reactive to light. Right eye exhibits no discharge. Left eye exhibits no discharge. No scleral icterus.  Neck: Normal range of motion. Neck supple. No JVD present. Carotid bruit is not present.    Cardiovascular: Normal rate, regular rhythm, normal heart sounds and intact distal pulses.  Exam reveals no gallop.   No murmur heard. Pulmonary/Chest: Effort normal and breath sounds normal. No respiratory distress. She has no wheezes. She has no rales. She exhibits tenderness.  No crackles  Tender in R lateral/ post ribs  Mild cva tenderness No step off deformity/ skin change or crepitus noted    Abdominal: Soft. Bowel sounds are normal. She exhibits no distension and no mass. There  is no tenderness.  No suprapubic tenderness or fullness    Musculoskeletal: She exhibits tenderness. She exhibits no edema.  No spinal tenderness   Lymphadenopathy:    She has no cervical adenopathy.  Neurological: She is alert. She has normal reflexes. No cranial nerve deficit. She exhibits normal muscle tone. Coordination normal.  Skin: Skin is warm and dry. No rash noted. No erythema. No pallor.  Several large SKs on back and trunk   Psychiatric: She has a normal mood and affect.          Assessment & Plan:   Problem List Items Addressed This Visit     Other   Dysuria     Also some pain in cva area  Not unusual for her -is somewhat chronic Enc fluid intake ua today    Relevant Orders      POCT urinalysis dipstick (Completed)   Chest wall pain - Primary     Right sided mostly in the back (but tenderness all the [place] around) No cough or new activity Check cxr today and udpate ? If poss costochonditis - use heat gently/ analgesics  Update if not starting to improve in a week or if worsening    Also ua today    Relevant Orders      DG Chest 2 View (Completed)

## 2014-04-27 NOTE — Assessment & Plan Note (Signed)
Right sided mostly in the back (but tenderness all the [place] around) No cough or new activity Check cxr today and udpate ? If poss costochonditis - use heat gently/ analgesics  Update if not starting to improve in a week or if worsening    Also ua today

## 2014-04-27 NOTE — Assessment & Plan Note (Addendum)
Also some pain in cva area  Not unusual for her -is somewhat chronic Enc fluid intake ua today- some bilirubin and protein

## 2014-04-27 NOTE — Progress Notes (Signed)
Pre visit review using our clinic review tool, if applicable. No additional management support is needed unless otherwise documented below in the visit note. 

## 2014-05-18 ENCOUNTER — Other Ambulatory Visit: Payer: Self-pay | Admitting: Internal Medicine

## 2014-05-25 ENCOUNTER — Other Ambulatory Visit: Payer: Self-pay | Admitting: Family Medicine

## 2014-05-28 ENCOUNTER — Encounter: Payer: Self-pay | Admitting: Cardiovascular Disease

## 2014-05-28 ENCOUNTER — Ambulatory Visit (INDEPENDENT_AMBULATORY_CARE_PROVIDER_SITE_OTHER): Payer: Medicare Other | Admitting: Cardiovascular Disease

## 2014-05-28 VITALS — BP 150/72 | HR 66 | Ht 63.0 in | Wt 180.8 lb

## 2014-05-28 DIAGNOSIS — E876 Hypokalemia: Secondary | ICD-10-CM

## 2014-05-28 DIAGNOSIS — E039 Hypothyroidism, unspecified: Secondary | ICD-10-CM

## 2014-05-28 DIAGNOSIS — E038 Other specified hypothyroidism: Secondary | ICD-10-CM

## 2014-05-28 DIAGNOSIS — I5031 Acute diastolic (congestive) heart failure: Secondary | ICD-10-CM

## 2014-05-28 DIAGNOSIS — E871 Hypo-osmolality and hyponatremia: Secondary | ICD-10-CM | POA: Insufficient documentation

## 2014-05-28 DIAGNOSIS — R002 Palpitations: Secondary | ICD-10-CM

## 2014-05-28 DIAGNOSIS — I2510 Atherosclerotic heart disease of native coronary artery without angina pectoris: Secondary | ICD-10-CM

## 2014-05-28 DIAGNOSIS — R110 Nausea: Secondary | ICD-10-CM

## 2014-05-28 DIAGNOSIS — I10 Essential (primary) hypertension: Secondary | ICD-10-CM

## 2014-05-28 NOTE — Assessment & Plan Note (Signed)
She appears relatively euvolemic on today's visit No changes to her medications 

## 2014-05-28 NOTE — Assessment & Plan Note (Signed)
She declined an EKG today. Ectopy occurred on clinical exam in a trigeminal pattern. Either APCs or PVCs.

## 2014-05-28 NOTE — Patient Instructions (Addendum)
You are doing well. No medication changes were made.  Please monitor your heart rate and blood pressure Drop off your numbers anytime, or call  We will check blood work today, BMP and TSH  Please call us if you have new issues that need to be addressed before your next appt.  Your physician wants you to follow-up in: 6 months.  You will receive a reminder letter in the mail two months in advance. If you don't receive a letter, please call our office to schedule the follow-up appointment.  Your next appointment will be scheduled in our new office located at :  [facility]  [address] [place]  [place], [place] [address]

## 2014-05-28 NOTE — Assessment & Plan Note (Signed)
We have ordered a repeat basic metabolic panel given prior lab work in July 2015 and also some general malaise on today's visit

## 2014-05-28 NOTE — Progress Notes (Signed)
HPI Comments: Kimberly [name] is a pleasant 80 year old woman with prior history of chest pain, cardiac catheterization in 2001 and 2006 with no significant CAD,negative stress test in may 2011, with recent admission to [facility] July 1 with discharge 86/57/8469 with diastolic CHF, acute. She presents for routine followup  In followup today, she reports that her blood pressure has been labile, sometimes down to 90/40. She's been having more headaches Also with some nausea and GI disturbance for the past week    01/31/2014 with tightness in her chest, shortness of breath.  diagnosed with diastolic CHF, She had significant diuresis in the hospital. BNP 1900, cardiac enzymes negative for MI Echocardiogram showed normal LV systolic function, diastolic dysfunction noted  She was discharged on Lasix 20 mg daily. Weight when she left the hospital was 178 to 180 pounds She denies having any significant lower extremity edema She states that she's not sleeping well. Fatigue since she left the hospital  Previous basic metabolic panel in July 6295 showing sodium 127, low potassium   Outpatient Encounter Prescriptions as of 05/28/2014  Medication Sig  . amLODipine (NORVASC) 10 MG tablet Take 1 tablet (10 mg total) by mouth at bedtime.  Marland Kitchen aspirin 81 MG tablet Take 81 mg by mouth daily.    . cetirizine (ZYRTEC) 10 MG tablet Take 10 mg by mouth daily as needed for allergies.   Marland Kitchen docusate sodium 100 MG CAPS Take 100 mg by mouth 2 (two) times daily. For constipation  . esomeprazole (NEXIUM) 40 MG capsule TAKE 1 CAPSULE DAILY  . fluticasone (FLONASE) 50 MCG/ACT nasal spray Place 2 sprays into both nostrils daily.  . furosemide (LASIX) 20 MG tablet TAKE 1 TABLET BY MOUTH DAILY  . guaiFENesin (MUCINEX) 600 MG 12 hr tablet Take 600 mg by mouth 2 (two) times daily as needed for cough or to loosen phlegm.   . hydrochlorothiazide (HYDRODIURIL) 25 MG tablet Take 0.5 tablets by mouth daily.  Marland Kitchen levothyroxine  (SYNTHROID, LEVOTHROID) 25 MCG tablet TAKE 1 TABLET BY MOUTH ONCE A DAY  . losartan (COZAAR) 100 MG tablet Take 1 tablet (100 mg total) by mouth daily.  . metoprolol (LOPRESSOR) 50 MG tablet TAKE 1 TABLET TWICE A DAY  . Multiple Vitamin (MULTIVITAMIN) capsule Take 1 capsule by mouth daily.    . potassium chloride SA (K-DUR,KLOR-CON) 20 MEQ tablet Take 1 tablet (20 mEq total) by mouth daily.  Marland Kitchen zolpidem (AMBIEN) 10 MG tablet TAKE 1/2 TO 1 TABLET BY MOUTH AT BEDTIME AS NEEDED     Review of Systems  Constitutional: Positive for fatigue.  HENT: Negative.   Eyes: Negative.   Respiratory: Negative.   Cardiovascular: Negative.   Gastrointestinal: Positive for nausea.  Endocrine: Negative.   Musculoskeletal: Negative.   Skin: Negative.   Allergic/Immunologic: Negative.   Neurological: Negative.   Hematological: Negative.   Psychiatric/Behavioral: Negative.   All other systems reviewed and are negative.  BP 150/72  Pulse 66  Ht 5\' 3"  (1.6 m)  Wt 180 lb 12 oz (81.988 kg)  BMI 32.03 kg/m2  LMP 08/03/1978   Objective:   Physical Exam  Nursing note and vitals reviewed. Constitutional: She is oriented to person, place, and time. She appears well-developed and well-nourished. No distress.  obese and well appearing   HENT:  Head: Normocephalic and atraumatic.  Nose: Nose normal.  Mouth/Throat: Oropharynx is clear and moist.  Eyes: Conjunctivae and EOM are normal. Pupils are equal, round, and reactive to light. No scleral icterus.  Neck: Normal  range of motion. Neck supple. No JVD present. Carotid bruit is not present. No thyromegaly present.  Cardiovascular: Normal rate, regular rhythm, S1 normal, S2 normal, normal heart sounds and intact distal pulses.  Exam reveals no gallop and no friction rub.   No murmur heard. Pulmonary/Chest: Effort normal and breath sounds normal. No respiratory distress. She has no wheezes. She has no rales. She exhibits no tenderness.  No crackles    Abdominal: Soft. Bowel sounds are normal. She exhibits no distension and no mass. There is no tenderness.  Musculoskeletal: Normal range of motion. She exhibits no edema and no tenderness.  Lymphadenopathy:    She has no cervical adenopathy.  Neurological: She is alert and oriented to person, place, and time. She has normal reflexes. No cranial nerve deficit. She exhibits normal muscle tone. Coordination normal.  Skin: Skin is warm and dry. No rash noted. No erythema. No pallor.  Psychiatric: Her speech is normal and behavior is normal. Judgment and thought content normal. Her mood appears anxious. Her affect is not blunt, not labile and not inappropriate. Cognition and memory are normal.  Mildly anxious - but pleasant and talkative

## 2014-05-28 NOTE — Assessment & Plan Note (Signed)
She is requesting repeat TSH given her nausea, malaise. This will be done in the office today

## 2014-05-28 NOTE — Assessment & Plan Note (Signed)
She is on potassium. We'll check a basic metabolic panel

## 2014-05-28 NOTE — Assessment & Plan Note (Signed)
Symptoms for the past week. She does not want a nausea medication. She'll monitor her symptoms for now, continue a bland diet

## 2014-05-28 NOTE — Assessment & Plan Note (Signed)
Blood pressure mildly elevated today. Suggested she monitor her blood pressure at home and call our office with numbers. She reports episodes of hypotension. Unclear if this is a miss reading or if she has labile blood pressure.

## 2014-05-29 LAB — BASIC METABOLIC PANEL
BUN/Creatinine Ratio: 20 (ref 11–26)
BUN: 22 mg/dL (ref 8–27)
CALCIUM: 9.2 mg/dL (ref 8.7–10.3)
CHLORIDE: 95 mmol/L — AB (ref 97–108)
CO2: 17 mmol/L — AB (ref 18–29)
CREATININE: 1.11 mg/dL — AB (ref 0.57–1.00)
GFR calc Af Amer: 53 mL/min/{1.73_m2} — ABNORMAL LOW (ref >59)
GFR calc non Af Amer: 46 mL/min/{1.73_m2} — ABNORMAL LOW (ref >59)
Glucose: 112 mg/dL — ABNORMAL HIGH (ref 65–99)
POTASSIUM: 4.5 mmol/L (ref 3.5–5.2)
SODIUM: 137 mmol/L (ref 134–144)

## 2014-05-29 LAB — TSH: TSH: 0.476 u[IU]/mL (ref 0.450–4.500)

## 2014-07-05 ENCOUNTER — Other Ambulatory Visit: Payer: Self-pay | Admitting: Internal Medicine

## 2014-07-19 ENCOUNTER — Other Ambulatory Visit: Payer: Self-pay | Admitting: *Deleted

## 2014-07-19 MED ORDER — POTASSIUM CHLORIDE CRYS ER 20 MEQ PO TBCR: 20.0000 meq | EXTENDED_RELEASE_TABLET | Freq: Every day | ORAL | Status: DC

## 2014-07-19 NOTE — Telephone Encounter (Signed)
Rx was prescribed when pt was at hospital in July and we have never refilled Rx, please advise

## 2014-07-19 NOTE — Telephone Encounter (Signed)
done

## 2014-07-19 NOTE — Telephone Encounter (Signed)
Please refill times 5  I think she is supposed to be on it - per last cardiology note

## 2014-08-18 ENCOUNTER — Other Ambulatory Visit: Payer: Self-pay | Admitting: Internal Medicine

## 2014-08-18 ENCOUNTER — Other Ambulatory Visit: Payer: Self-pay | Admitting: Family Medicine

## 2014-10-02 ENCOUNTER — Other Ambulatory Visit: Payer: Self-pay | Admitting: Family Medicine

## 2014-10-23 ENCOUNTER — Other Ambulatory Visit: Payer: Self-pay | Admitting: Family Medicine

## 2014-10-23 ENCOUNTER — Ambulatory Visit (INDEPENDENT_AMBULATORY_CARE_PROVIDER_SITE_OTHER): Payer: Medicare Other | Admitting: Family Medicine

## 2014-10-23 ENCOUNTER — Encounter: Payer: Self-pay | Admitting: Family Medicine

## 2014-10-23 VITALS — BP 190/90 | HR 74 | Temp 97.5°F | Ht 63.0 in | Wt 182.0 lb

## 2014-10-23 DIAGNOSIS — I10 Essential (primary) hypertension: Secondary | ICD-10-CM

## 2014-10-23 DIAGNOSIS — E039 Hypothyroidism, unspecified: Secondary | ICD-10-CM | POA: Diagnosis not present

## 2014-10-23 MED ORDER — ESOMEPRAZOLE MAGNESIUM 40 MG PO CPDR: 40.0000 mg | DELAYED_RELEASE_CAPSULE | Freq: Every day | ORAL | Status: DC

## 2014-10-23 MED ORDER — POTASSIUM CHLORIDE CRYS ER 20 MEQ PO TBCR: 20.0000 meq | EXTENDED_RELEASE_TABLET | Freq: Every day | ORAL | Status: DC

## 2014-10-23 MED ORDER — METOPROLOL TARTRATE 50 MG PO TABS: 50.0000 mg | ORAL_TABLET | Freq: Two times a day (BID) | ORAL | Status: DC

## 2014-10-23 MED ORDER — FUROSEMIDE 20 MG PO TABS: 20.0000 mg | ORAL_TABLET | Freq: Every day | ORAL | Status: DC

## 2014-10-23 NOTE — Assessment & Plan Note (Signed)
bp is high today  Much lower in afternoons at home  Rev med and [place] she takes them  rec f/u 10-14 d with her cuff- to test it (afternoon appt)  Disc water intake Disc sodium intake and exercise Copy of DASH eating plan given

## 2014-10-23 NOTE — Progress Notes (Signed)
Pre visit review using our clinic review tool, if applicable. No additional management support is needed unless otherwise documented below in the visit note. 

## 2014-10-23 NOTE — Progress Notes (Signed)
Subjective:    Patient ID: Kimberly [name], female    DOB: Jul 27, 1928, 81 y.o.   MRN: [id number]  HPI Here for f/u of chronic health problems   Having allergy trouble - otherwise feeling fine    bp is up today  Pt states at home it has gone up to 166A systolic - then 630 systolic after taking her medicine   (it goes down as the day goes on )  Took med 30 min ago - metoprolol bid  and amlodipine at night , lasix and losartan in am  ? If her cuff is accurate  No cp or palpitations or headaches or edema  No side effects to medicines  BP Readings from Last 3 Encounters:  10/23/14 190/90  05/28/14 150/72  04/27/14 130/70    Re check sitting today 180/80  Hx of CHF sees Dr Rockey Situ -no symptoms lately  Hx of hypothyroidism Lab Results  Component Value Date   TSH 0.476 05/28/2014   no clinical changes   GERD= on nexium, well controlled   Wt is up 2 lb with bmi of 32   Not enough exercise  Tries to avoid sodium   Patient Active Problem List   Diagnosis Date Noted  . Hyposmolality and/or hyponatremia 05/28/2014  . Hypokalemia 05/28/2014  . Nausea 05/28/2014  . Hypothyroid 05/28/2014  . Chest wall pain 04/27/2014  . Abdominal pain, acute, generalized 01/31/2014  . Acute diastolic heart failure 16/08/930  . Hyponatremia 01/31/2014  . Unspecified constipation 01/31/2014  . Acute diastolic CHF (congestive heart failure) 01/31/2014  . Acute bacterial sinusitis 10/13/2013  . Thrush, oral 03/21/2013  . Left thyroid nodule 08/04/2012  . Obesity 12/04/2011  . Gastritis 12/04/2011  . Heart palpitations 09/01/2011  . Depression 05/26/2011  . Frequent UTI 12/30/2010  . Lower abdominal pain 11/27/2010  . Dysuria 11/26/2010  . COLONIC POLYPS 04/09/2010  . ANEMIA, MILD 04/09/2010  . HEMORRHOIDS 03/04/2010  . ANXIETY 12/10/2009  . PERSONAL HX COLONIC POLYPS 06/19/2008  . IRRITABLE BOWEL SYNDROME 03/28/2008  . 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 GOITER 03/29/2007  .  Hypothyroidism 03/29/2007  . HYPERLIPIDEMIA 03/29/2007  . Essential hypertension 03/29/2007  . CORONARY ARTERY DISEASE 03/29/2007  . BUNDLE BRANCH BLOCK 03/29/2007  . ALLERGIC RHINITIS 03/29/2007  . GERD 03/29/2007  . HYPERGLYCEMIA 03/29/2007   Past Medical History  Diagnosis Date  . Allergy     allergic rhinitis  . CAD (coronary artery disease)   . GERD (gastroesophageal reflux disease)   . Hyperlipidemia   . Hypertension   . Hypothyroid   . Insomnia   . CHF (congestive heart failure)    Past Surgical History  Procedure Laterality Date  . Oophorectomy    . Thyroid surgery    . Cardiac catheterization      [facility]  . Cardiac catheterization      [facility]   History  Substance Use Topics  . Smoking status: Never Smoker   . Smokeless tobacco: Not on file  . Alcohol Use: No   Family History  Problem Relation Age of Onset  . Heart disease Mother   . Hypertension Mother    Allergies  Allergen Reactions  . Cephalexin     REACTION: rash and burning sensation to skin on arms and legs with itching.  . Ciprofloxacin     REACTION: burning of mouth and skin  . Elavil [Amitriptyline]     palpitation  . Lisinopril     REACTION: cough  . Penicillins  REACTION: rash  . Remeron [Mirtazapine]     constipation  . Sulfonamide Derivatives     REACTION: malaise  . Tetanus Toxoid     REACTION: swelling  . Vesicare [Solifenacin Succinate] Other (See Comments)    Blurred vision and weakness  . Zoloft [Sertraline Hcl]     GI upset and nausea   Current Outpatient Prescriptions on File Prior to Visit  Medication Sig Dispense Refill  . amLODipine (NORVASC) 10 MG tablet Take 1 tablet (10 mg total) by mouth at bedtime. 30 tablet 11  . aspirin 81 MG tablet Take 81 mg by mouth daily.      . cetirizine (ZYRTEC) 10 MG tablet Take 10 mg by mouth daily as needed for allergies.     Marland Kitchen docusate sodium 100 MG CAPS Take 100 mg by mouth 2 (two) times daily. For constipation 60 capsule 3  .  esomeprazole (NEXIUM) 40 MG capsule Take 1 capsule (40 mg total) by mouth daily. * Needs to schedule a physical with Dr for refills* 90 capsule 0  . fluticasone (FLONASE) 50 MCG/ACT nasal spray Place 2 sprays into both nostrils daily. 48 g 3  . furosemide (LASIX) 20 MG tablet TAKE 1 TABLET BY MOUTH DAILY 30 tablet 5  . guaiFENesin (MUCINEX) 600 MG 12 hr tablet Take 600 mg by mouth 2 (two) times daily as needed for cough or to loosen phlegm.     . hydrochlorothiazide (HYDRODIURIL) 25 MG tablet Take 0.5 tablets by mouth daily.    Marland Kitchen levothyroxine (SYNTHROID, LEVOTHROID) 25 MCG tablet Take 1 tablet (25 mcg total) by mouth daily. * Needs to schedule a physical with Dr for refills* 90 tablet 0  . losartan (COZAAR) 100 MG tablet Take 1 tablet (100 mg total) by mouth daily. * Needs to schedule a physical with Dr for refills* 90 tablet 0  . metoprolol (LOPRESSOR) 50 MG tablet Take 1 tablet (50 mg total) by mouth 2 (two) times daily. * Needs to schedule a physical with Dr for refills* 180 tablet 0  . Multiple Vitamin (MULTIVITAMIN) capsule Take 1 capsule by mouth daily.      . potassium chloride SA (K-DUR,KLOR-CON) 20 MEQ tablet Take 1 tablet (20 mEq total) by mouth daily. 30 tablet 4  . zolpidem (AMBIEN) 10 MG tablet TAKE 1/2 TO 1 TABLET BY MOUTH AT BEDTIME AS NEEDED 30 tablet 5   No current facility-administered medications on file prior to visit.      Review of Systems Review of Systems  Constitutional: Negative for fever, appetite change, fatigue and unexpected weight change.  Eyes: Negative for pain and visual disturbance.  Respiratory: Negative for cough and shortness of breath.   Cardiovascular: Negative for cp or palpitations    Gastrointestinal: Negative for nausea, diarrhea and constipation.  Genitourinary: Negative for urgency and frequency.  Skin: Negative for pallor or rash   Neurological: Negative for weakness, light-headedness, numbness and headaches.  Hematological: Negative for  adenopathy. Does not bruise/bleed easily.  Psychiatric/Behavioral: Negative for dysphoric mood. The patient is not nervous/anxious.         Objective:   Physical Exam  Constitutional: She appears well-developed and well-nourished. No distress.  obese and well appearing   HENT:  Head: Normocephalic and atraumatic.  Mouth/Throat: Oropharynx is clear and moist.  Eyes: Conjunctivae and EOM are normal. Pupils are equal, round, and reactive to light. No scleral icterus.  Neck: Normal range of motion. Neck supple. No JVD present. Carotid bruit is not present. No thyromegaly  present.  Cardiovascular: Normal rate, regular rhythm, normal heart sounds and intact distal pulses.  Exam reveals no gallop.   Pulmonary/Chest: Effort normal and breath sounds normal. No respiratory distress. She has no wheezes. She has no rales.  No crackles   Abdominal: Soft. Bowel sounds are normal. She exhibits no distension and no mass. There is no tenderness.  No renal bruits   Musculoskeletal: She exhibits no edema or tenderness.  Lymphadenopathy:    She has no cervical adenopathy.  Neurological: She is alert. She has normal reflexes. No cranial nerve deficit. She exhibits normal muscle tone. Coordination normal.  Skin: Skin is warm and dry. No rash noted. No erythema. No pallor.  Psychiatric: She has a normal mood and affect.          Assessment & Plan:   Problem List Items Addressed This Visit      Cardiovascular and Mediastinum   Essential hypertension - Primary    bp is high today  Much lower in afternoons at home  Rev med and [place] she takes them  rec f/u 10-14 d with her cuff- to test it (afternoon appt)  Disc water intake Disc sodium intake and exercise Copy of DASH eating plan given       Relevant Medications   metoprolol (LOPRESSOR) tablet   furosemide (LASIX) tablet     Endocrine   Hypothyroidism     Pt has no clinical changes No change in energy level/ hair or skin/ edema and no  tremor Lab Results  Component Value Date   TSH 0.476 05/28/2014          Relevant Medications   metoprolol (LOPRESSOR) tablet

## 2014-10-23 NOTE — Assessment & Plan Note (Signed)
  Pt has no clinical changes No change in energy level/ hair or skin/ edema and no tremor Lab Results  Component Value Date   TSH 0.476 05/28/2014

## 2014-10-23 NOTE — Patient Instructions (Signed)
Your blood pressure is high today  Keep checking it at home  Avoid excess sodium- take a look at the dash diet  Be as active as you can/exercise helps Follow up in about 14 days for afternoon appointment and bring your cuff - we will test it

## 2014-11-05 ENCOUNTER — Ambulatory Visit: Payer: Medicare Other | Admitting: Family Medicine

## 2014-11-06 ENCOUNTER — Encounter: Payer: Self-pay | Admitting: Family Medicine

## 2014-11-06 ENCOUNTER — Ambulatory Visit (INDEPENDENT_AMBULATORY_CARE_PROVIDER_SITE_OTHER): Payer: Medicare Other | Admitting: Family Medicine

## 2014-11-06 VITALS — BP 170/72 | HR 63 | Temp 97.5°F | Ht 63.0 in | Wt 181.8 lb

## 2014-11-06 DIAGNOSIS — N390 Urinary tract infection, site not specified: Secondary | ICD-10-CM | POA: Diagnosis not present

## 2014-11-06 DIAGNOSIS — I10 Essential (primary) hypertension: Secondary | ICD-10-CM | POA: Diagnosis not present

## 2014-11-06 DIAGNOSIS — R109 Unspecified abdominal pain: Secondary | ICD-10-CM | POA: Diagnosis not present

## 2014-11-06 LAB — POCT URINALYSIS DIPSTICK
BILIRUBIN UA: NEGATIVE
Blood, UA: NEGATIVE
Glucose, UA: NEGATIVE
Ketones, UA: NEGATIVE
NITRITE UA: NEGATIVE
PH UA: 6
PROTEIN UA: NEGATIVE
Spec Grav, UA: 1.015
UROBILINOGEN UA: 0.2

## 2014-11-06 MED ORDER — NITROFURANTOIN MONOHYD MACRO 100 MG PO CAPS: 100.0000 mg | ORAL_CAPSULE | Freq: Two times a day (BID) | ORAL | Status: DC

## 2014-11-06 NOTE — Progress Notes (Signed)
Pre visit review using our clinic review tool, if applicable. No additional management support is needed unless otherwise documented below in the visit note. 

## 2014-11-06 NOTE — Patient Instructions (Signed)
Keep drinking water Take macrobid as directed for uti  Update me if no improvement  We will contact you when urine culture is back  Follow up with Dr Rockey Situ as planned and take your cuff to that visit also  I am re assured that your blood pressure is lower at home and meter seems pretty accurate

## 2014-11-06 NOTE — Assessment & Plan Note (Signed)
After testing her home cuff for accuracy (fairly close to our readings)- re assured that this is whitecoat affect Will continue to check at home when relaxed-rev technique Continue watching sodium in diet F/u cardiology as planned -also told her to take her bp cuff to that visit  F/u 6 mo annual exam

## 2014-11-06 NOTE — Progress Notes (Signed)
Subjective:    Patient ID: Kimberly [name], female    DOB: 10/23/1927, 81 y.o.   MRN: [id number]  HPI Here for HTN and also urinary symptoms   Urine symptoms started last week Bladder pressure  Low back pain (no flank pain)  Burns to urinate  Frequency/urgency No fever or n/v   ua pos for leukocytes    bp home 140s/50s -- or less   (sometimes in am - even lower )  Now with our cuff 200/78  Pt states her cuff is similar   Pt cuff L arm sitting with arm at heart level   - is 176/84 Our cuff 170/72 after sitting  Similar readings overall Pt states she is not aware of any anxiety       Chemistry      Component Value Date/Time   NA 137 05/28/2014 1549   NA 134* 02/12/2014 1229   K 4.5 05/28/2014 1549   CL 95* 05/28/2014 1549   CO2 17* 05/28/2014 1549   BUN 22 05/28/2014 1549   BUN 19 02/12/2014 1229   CREATININE 1.11* 05/28/2014 1549      Component Value Date/Time   CALCIUM 9.2 05/28/2014 1549   ALKPHOS 93 02/12/2014 1229   AST 19 02/12/2014 1229   ALT 16 02/12/2014 1229   BILITOT 0.4 02/12/2014 1229        Review of Systems Review of Systems  Constitutional: Negative for fever, appetite change, fatigue and unexpected weight change.  Eyes: Negative for pain and visual disturbance.  Respiratory: Negative for cough and shortness of breath.   Cardiovascular: Negative for cp or palpitations    Gastrointestinal: Negative for nausea, diarrhea and constipation.  Genitourinary: pos for urgency and frequency. neg for gross hematuria  Skin: Negative for pallor or rash   Neurological: Negative for weakness, light-headedness, numbness and headaches.  Hematological: Negative for adenopathy. Does not bruise/bleed easily.  Psychiatric/Behavioral: Negative for dysphoric mood. The patient is not nervous/anxious.         Objective:   Physical Exam  Constitutional: She appears well-developed and well-nourished. No distress.  HENT:  Head: Normocephalic and atraumatic.    Mouth/Throat: Oropharynx is clear and moist.  Eyes: Conjunctivae and EOM are normal. Pupils are equal, round, and reactive to light.  Neck: Normal range of motion. Neck supple. No JVD present. Carotid bruit is not present. No thyromegaly present.  Cardiovascular: Normal rate, regular rhythm, normal heart sounds and intact distal pulses.  Exam reveals no gallop.   Pulmonary/Chest: Effort normal and breath sounds normal. No respiratory distress. She has no wheezes. She has no rales.  No crackles  Abdominal: Soft. Bowel sounds are normal. She exhibits no distension, no abdominal bruit and no mass. There is tenderness. There is no rebound and no CVA tenderness.  Mild suprapubic tenderness  Musculoskeletal: She exhibits no edema.  Lymphadenopathy:    She has no cervical adenopathy.  Neurological: She is alert. She has normal reflexes.  Skin: Skin is warm and dry. No rash noted.  Psychiatric: She has a normal mood and affect.          Assessment & Plan:   Problem List Items Addressed This Visit      Cardiovascular and Mediastinum   Essential hypertension - Primary    After testing her home cuff for accuracy (fairly close to our readings)- re assured that this is whitecoat affect Will continue to check at home when relaxed-rev technique Continue watching sodium in diet F/u cardiology as  planned -also told her to take her bp cuff to that visit  F/u 6 mo annual exam        Genitourinary   Frequent UTI    Pos UA today with voiding symptoms  Cover with macrobid (she has many abx all)  Fluids enc  Ucx pend  F/u if no improvement       Relevant Medications   MACROBID 100 MG PO CAPS   Other Relevant Orders   Urine culture (Completed)    Other Visit Diagnoses    Abdominal pain, unspecified abdominal location        Relevant Orders    POCT urinalysis dipstick (Completed)

## 2014-11-06 NOTE — Assessment & Plan Note (Signed)
Pos UA today with voiding symptoms  Cover with macrobid (she has many abx all)  Fluids enc  Ucx pend  F/u if no improvement

## 2014-11-09 LAB — URINE CULTURE: Colony Count: >=100000

## 2014-11-12 ENCOUNTER — Telehealth: Payer: Self-pay

## 2014-11-12 NOTE — Telephone Encounter (Signed)
-----   Message from Abner Greenspan, MD sent at 11/11/2014  5:43 PM EDT ----- Urine culture shows bacteria that is "intermediately" sensitive to macrobid (abx she is on) So let me know if symptoms are not improved

## 2014-11-12 NOTE — Telephone Encounter (Signed)
Patient says she is feeling better and will call if doesn't improve.

## 2014-11-24 ENCOUNTER — Other Ambulatory Visit: Payer: Self-pay | Admitting: Family Medicine

## 2014-11-26 ENCOUNTER — Ambulatory Visit (INDEPENDENT_AMBULATORY_CARE_PROVIDER_SITE_OTHER): Payer: Medicare Other | Admitting: Cardiovascular Disease

## 2014-11-26 ENCOUNTER — Encounter: Payer: Self-pay | Admitting: Cardiovascular Disease

## 2014-11-26 VITALS — BP 144/60 | HR 70 | Ht 63.0 in | Wt 182.0 lb

## 2014-11-26 DIAGNOSIS — E669 Obesity, unspecified: Secondary | ICD-10-CM

## 2014-11-26 DIAGNOSIS — E785 Hyperlipidemia, unspecified: Secondary | ICD-10-CM

## 2014-11-26 DIAGNOSIS — I10 Essential (primary) hypertension: Secondary | ICD-10-CM

## 2014-11-26 DIAGNOSIS — I454 Nonspecific intraventricular block: Secondary | ICD-10-CM | POA: Diagnosis not present

## 2014-11-26 DIAGNOSIS — I25119 Atherosclerotic heart disease of native coronary artery with unspecified angina pectoris: Secondary | ICD-10-CM | POA: Diagnosis not present

## 2014-11-26 DIAGNOSIS — I5031 Acute diastolic (congestive) heart failure: Secondary | ICD-10-CM | POA: Diagnosis not present

## 2014-11-26 NOTE — Telephone Encounter (Signed)
Rx called to pharmacy

## 2014-11-26 NOTE — Assessment & Plan Note (Signed)
She appears relatively euvolemic on today's visit. Edema likely noncardiac, more venous insufficiency. Nonpitting. Could possibly hold the HCTZ, continue on Lasix daily. This change was not made today as we decreased her amlodipine

## 2014-11-26 NOTE — Assessment & Plan Note (Signed)
Currently not on a statin °

## 2014-11-26 NOTE — Telephone Encounter (Signed)
Last OV 11/06/14 for acute symptoms.  Rx last filled 10/23/14 #30 x0 RF.  Okay to refill?

## 2014-11-26 NOTE — Assessment & Plan Note (Signed)
She reports blood pressure is relatively well-controlled at home. She did not bring her blood pressure cuff with her today. Blood pressure on my check was 579 systolic. She is concerned about worsening leg edema. This appears to be venous insufficiency oh possible mild exacerbation by the amlodipine. We have discussed this with her and suggested she decrease the amlodipine down to 5 mg, from 10 mg. Suggested she closely monitor her blood pressure and call our office if this runs high. Additional options for blood pressure medication include Cardura, clonidine

## 2014-11-26 NOTE — Patient Instructions (Signed)
Please cut the amlodipine in 1/2 (5 mg) daily,  Lower dose to see if leg swelling gets better  Monitor blood pressure Call if leg swelling continues  Please call us if you have new issues that need to be addressed before your next appt.  Your physician wants you to follow-up in: 6 months.  You will receive a reminder letter in the mail two months in advance. If you don't receive a letter, please call our office to schedule the follow-up appointment.

## 2014-11-26 NOTE — Telephone Encounter (Signed)
Px written for call in   

## 2014-11-26 NOTE — Assessment & Plan Note (Signed)
We have encouraged continued exercise, careful diet management in an effort to lose weight. 

## 2014-11-26 NOTE — Progress Notes (Signed)
HPI Comments: [name] is a pleasant 81 year-old woman with prior history of chest pain, cardiac catheterization in 2001 and 2006 with no significant CAD,negative stress test in may 2011, with recent admission to [place] July 1 with discharge 58/52/7782 with diastolic CHF, acute. She presents for routine followup of her chest pain and diastolic CHF  In follow-up today, she reports that her blood pressure has been well controlled at home, typically in the 117 up to 120 range. She has had worsening lower extremity edema. This is new for her.  Recently seen by Dr. Glori Bickers, blood pressure was elevated but she attributes this to a urinary tract infection. She continues to take Lasix 20 mg daily, also taking HCTZ Otherwise active, no other complaints  EKG on today's visit shows normal sinus rhythm with rate 70 bpm, left bundle branch block, PVCs noted  Other past medical history  01/31/2014 with tightness in her chest, shortness of breath.  diagnosed with diastolic CHF, She had significant diuresis in the hospital. BNP 1900, cardiac enzymes negative for MI Echocardiogram showed normal LV systolic function, diastolic dysfunction noted  She was discharged on Lasix 20 mg daily. Weight when she left the hospital was 178 to 180 pounds She denies having any significant lower extremity edema She states that she's not sleeping well. Fatigue since she left the hospital  Previous basic metabolic panel in July 4235 showing sodium 127, low potassium   Allergies  Allergen Reactions  . Cephalexin     REACTION: rash and burning sensation to skin on arms and legs with itching.  . Ciprofloxacin     REACTION: burning of mouth and skin  . Elavil [Amitriptyline]     palpitation  . Lisinopril     REACTION: cough  . Penicillins     REACTION: rash  . Remeron [Mirtazapine]     constipation  . Sulfonamide Derivatives     REACTION: malaise  . Tetanus Toxoid     REACTION: swelling  . Vesicare  [Solifenacin Succinate] Other (See Comments)    Blurred vision and weakness  . Zoloft [Sertraline Hcl]     GI upset and nausea    Outpatient Encounter Prescriptions as of 11/26/2014  Medication Sig  . amLODipine (NORVASC) 10 MG tablet Take 1 tablet (10 mg total) by mouth at bedtime.  Marland Kitchen aspirin 81 MG tablet Take 81 mg by mouth daily.    . cetirizine (ZYRTEC) 10 MG tablet Take 10 mg by mouth daily as needed for allergies.   Marland Kitchen docusate sodium 100 MG CAPS Take 100 mg by mouth 2 (two) times daily. For constipation  . esomeprazole (NEXIUM) 40 MG capsule Take 1 capsule (40 mg total) by mouth daily.  . fluticasone (FLONASE) 50 MCG/ACT nasal spray Place 2 sprays into both nostrils daily.  . furosemide (LASIX) 20 MG tablet Take 1 tablet (20 mg total) by mouth daily.  Marland Kitchen guaiFENesin (MUCINEX) 600 MG 12 hr tablet Take 600 mg by mouth 2 (two) times daily as needed for cough or to loosen phlegm.   . hydrochlorothiazide (HYDRODIURIL) 25 MG tablet Take 0.5 tablets by mouth daily.  Marland Kitchen levothyroxine (SYNTHROID, LEVOTHROID) 25 MCG tablet Take 1 tablet (25 mcg total) by mouth daily. * Needs to schedule a physical with Dr for refills*  . losartan (COZAAR) 100 MG tablet Take 1 tablet (100 mg total) by mouth daily. * Needs to schedule a physical with Dr for refills*  . metoprolol (LOPRESSOR) 50 MG tablet Take 1 tablet (50 mg  total) by mouth 2 (two) times daily.  . Multiple Vitamin (MULTIVITAMIN) capsule Take 1 capsule by mouth daily.    . nitrofurantoin, macrocrystal-monohydrate, (MACROBID) 100 MG capsule Take 1 capsule (100 mg total) by mouth 2 (two) times daily.  . potassium chloride SA (K-DUR,KLOR-CON) 20 MEQ tablet Take 1 tablet (20 mEq total) by mouth daily.  Marland Kitchen zolpidem (AMBIEN) 10 MG tablet TAKE ONE-HALF(1/2) TO ONE(1) TABLET BY MOUTH AT BEDTIME AS NEEDED    Past Medical History  Diagnosis Date  . Allergy     allergic rhinitis  . CAD (coronary artery disease)   . GERD (gastroesophageal reflux disease)    . Hyperlipidemia   . Hypertension   . Hypothyroid   . Insomnia   . CHF (congestive heart failure)     Past Surgical History  Procedure Laterality Date  . Oophorectomy    . Thyroid surgery    . Cardiac catheterization      [facility]  . Cardiac catheterization      [facility]    Social History  reports that she has never smoked. She does not have any smokeless tobacco history on file. She reports that she does not drink alcohol or use illicit drugs.  Family History family history includes Heart disease in her mother; Hypertension in her mother.   Review of Systems  Constitutional: Positive for fatigue.  HENT: Negative.   Respiratory: Negative.   Cardiovascular: Negative.   Gastrointestinal: Positive for nausea.  Musculoskeletal: Negative.   Neurological: Negative.   Hematological: Negative.   Psychiatric/Behavioral: Negative.   All other systems reviewed and are negative.  BP 144/60 mmHg  Pulse 70  Ht 5\' 3"  (1.6 m)  Wt 182 lb (82.555 kg)  BMI 32.25 kg/m2  LMP 08/03/1978   Objective:   Physical Exam  Constitutional: She is oriented to person, place, and time. She appears well-developed and well-nourished. No distress.  obese and well appearing   HENT:  Head: Normocephalic and atraumatic.  Nose: Nose normal.  Mouth/Throat: Oropharynx is clear and moist.  Eyes: Conjunctivae and EOM are normal. Pupils are equal, round, and reactive to light. No scleral icterus.  Neck: Normal range of motion. Neck supple. No JVD present. Carotid bruit is not present. No thyromegaly present.  Cardiovascular: Normal rate, regular rhythm, S1 normal, S2 normal, normal heart sounds and intact distal pulses.  Exam reveals no gallop and no friction rub.   No murmur heard. Pulmonary/Chest: Effort normal and breath sounds normal. No respiratory distress. She has no wheezes. She has no rales. She exhibits no tenderness.  No crackles   Abdominal: Soft. Bowel sounds are normal. She exhibits no distension and  no mass. There is no tenderness.  Musculoskeletal: Normal range of motion. She exhibits no edema or tenderness.  Lymphadenopathy:    She has no cervical adenopathy.  Neurological: She is alert and oriented to person, place, and time. She has normal reflexes. No cranial nerve deficit. She exhibits normal muscle tone. Coordination normal.  Skin: Skin is warm and dry. No rash noted. No erythema. No pallor.  Psychiatric: Her speech is normal and behavior is normal. Judgment and thought content normal. Her mood appears anxious. Her affect is not blunt, not labile and not inappropriate. Cognition and memory are normal.  Mildly anxious - but pleasant and talkative   Nursing note and vitals reviewed.

## 2015-01-06 ENCOUNTER — Other Ambulatory Visit: Payer: Self-pay | Admitting: Family Medicine

## 2015-01-06 ENCOUNTER — Other Ambulatory Visit: Payer: Self-pay | Admitting: Internal Medicine

## 2015-01-28 ENCOUNTER — Other Ambulatory Visit: Payer: Self-pay

## 2015-01-28 MED ORDER — AMLODIPINE BESYLATE 10 MG PO TABS: 10.0000 mg | ORAL_TABLET | Freq: Every day | ORAL | Status: DC

## 2015-01-28 NOTE — Telephone Encounter (Signed)
Refill sent for amlodipine 10 mg   

## 2015-03-01 ENCOUNTER — Other Ambulatory Visit: Payer: Self-pay | Admitting: Family Medicine

## 2015-03-17 ENCOUNTER — Other Ambulatory Visit: Payer: Self-pay | Admitting: Family Medicine

## 2015-03-18 ENCOUNTER — Other Ambulatory Visit: Payer: Self-pay | Admitting: Family Medicine

## 2015-03-19 ENCOUNTER — Other Ambulatory Visit: Payer: Self-pay | Admitting: Family Medicine

## 2015-03-26 ENCOUNTER — Other Ambulatory Visit: Payer: Self-pay

## 2015-03-26 MED ORDER — ZOLPIDEM TARTRATE 10 MG PO TABS: ORAL_TABLET | ORAL | Status: DC

## 2015-03-26 NOTE — Telephone Encounter (Signed)
Rx called to pharmacy as instructed. Kim notified by telephone that script has been called in.

## 2015-03-26 NOTE — Telephone Encounter (Signed)
Px written for call in   

## 2015-03-26 NOTE — Telephone Encounter (Signed)
Kim,pts daughter (DPR signed) left v/m requesting refill zolpidem to [facility]. Pt is out of med. rx last refilled # 30 x 3 on 11/26/14. Last seen 11/06/14. Kim request cb.

## 2015-04-05 ENCOUNTER — Other Ambulatory Visit: Payer: Self-pay | Admitting: Family Medicine

## 2015-04-19 ENCOUNTER — Other Ambulatory Visit: Payer: Self-pay | Admitting: Family Medicine

## 2015-05-19 ENCOUNTER — Telehealth: Payer: Self-pay | Admitting: Family Medicine

## 2015-05-19 DIAGNOSIS — R7309 Other abnormal glucose: Secondary | ICD-10-CM

## 2015-05-19 DIAGNOSIS — E785 Hyperlipidemia, unspecified: Secondary | ICD-10-CM

## 2015-05-19 DIAGNOSIS — I10 Essential (primary) hypertension: Secondary | ICD-10-CM

## 2015-05-19 DIAGNOSIS — E039 Hypothyroidism, unspecified: Secondary | ICD-10-CM

## 2015-05-19 NOTE — Telephone Encounter (Signed)
-----   Message from Ellamae Sia sent at 05/15/2015  3:40 PM EDT ----- Regarding: Lab orders for Tuesday, 10.18.16 Patient is scheduled for CPX labs, please order future labs, Thanks , Karna Christmas

## 2015-05-21 ENCOUNTER — Other Ambulatory Visit: Payer: Medicare Other

## 2015-05-21 ENCOUNTER — Other Ambulatory Visit (INDEPENDENT_AMBULATORY_CARE_PROVIDER_SITE_OTHER): Payer: Medicare Other

## 2015-05-21 DIAGNOSIS — R7309 Other abnormal glucose: Secondary | ICD-10-CM

## 2015-05-21 DIAGNOSIS — E039 Hypothyroidism, unspecified: Secondary | ICD-10-CM | POA: Diagnosis not present

## 2015-05-21 DIAGNOSIS — E785 Hyperlipidemia, unspecified: Secondary | ICD-10-CM

## 2015-05-21 DIAGNOSIS — I10 Essential (primary) hypertension: Secondary | ICD-10-CM | POA: Diagnosis not present

## 2015-05-21 LAB — CBC WITH DIFFERENTIAL/PLATELET
BASOS PCT: 0.6 % (ref 0.0–3.0)
Basophils Absolute: 0 10*3/uL (ref 0.0–0.1)
EOS PCT: 2.6 % (ref 0.0–5.0)
Eosinophils Absolute: 0.2 10*3/uL (ref 0.0–0.7)
HEMATOCRIT: 33 % — AB (ref 36.0–46.0)
Hemoglobin: 11.2 g/dL — ABNORMAL LOW (ref 12.0–15.0)
LYMPHS PCT: 17.4 % (ref 12.0–46.0)
Lymphs Abs: 1.2 10*3/uL (ref 0.7–4.0)
MCHC: 33.9 g/dL (ref 30.0–36.0)
MCV: 84.9 fl (ref 78.0–100.0)
MONOS PCT: 12.1 % — AB (ref 3.0–12.0)
Monocytes Absolute: 0.9 10*3/uL (ref 0.1–1.0)
Neutro Abs: 4.7 10*3/uL (ref 1.4–7.7)
Neutrophils Relative %: 67.3 % (ref 43.0–77.0)
Platelets: 321 10*3/uL (ref 150.0–400.0)
RBC: 3.89 Mil/uL (ref 3.87–5.11)
RDW: 13.9 % (ref 11.5–15.5)
WBC: 7.1 10*3/uL (ref 4.0–10.5)

## 2015-05-21 LAB — COMPREHENSIVE METABOLIC PANEL
ALBUMIN: 4 g/dL (ref 3.5–5.2)
ALT: 13 U/L (ref 0–35)
AST: 21 U/L (ref 0–37)
Alkaline Phosphatase: 118 U/L — ABNORMAL HIGH (ref 39–117)
BILIRUBIN TOTAL: 0.4 mg/dL (ref 0.2–1.2)
BUN: 18 mg/dL (ref 6–23)
CHLORIDE: 86 meq/L — AB (ref 96–112)
CO2: 28 mEq/L (ref 19–32)
Calcium: 9 mg/dL (ref 8.4–10.5)
Creatinine, Ser: 1.2 mg/dL (ref 0.40–1.20)
GFR: 45.33 mL/min — ABNORMAL LOW (ref >60.00)
Glucose, Bld: 141 mg/dL — ABNORMAL HIGH (ref 70–99)
POTASSIUM: 3.4 meq/L — AB (ref 3.5–5.1)
Sodium: 124 mEq/L — ABNORMAL LOW (ref 135–145)
Total Protein: 7.6 g/dL (ref 6.0–8.3)

## 2015-05-21 LAB — LIPID PANEL
CHOL/HDL RATIO: 5
CHOLESTEROL: 162 mg/dL (ref 0–200)
HDL: 34 mg/dL — AB (ref >39.00)
NONHDL: 127.96
TRIGLYCERIDES: 237 mg/dL — AB (ref 0.0–149.0)
VLDL: 47.4 mg/dL — AB (ref 0.0–40.0)

## 2015-05-21 LAB — TSH: TSH: 1.46 u[IU]/mL (ref 0.35–4.50)

## 2015-05-21 LAB — HEMOGLOBIN A1C: Hgb A1c MFr Bld: 6.2 % (ref 4.6–6.5)

## 2015-05-21 LAB — LDL CHOLESTEROL, DIRECT: Direct LDL: 96 mg/dL

## 2015-05-27 ENCOUNTER — Ambulatory Visit (INDEPENDENT_AMBULATORY_CARE_PROVIDER_SITE_OTHER): Payer: Medicare Other | Admitting: Family Medicine

## 2015-05-27 ENCOUNTER — Encounter: Payer: Self-pay | Admitting: Family Medicine

## 2015-05-27 VITALS — BP 128/68 | HR 64 | Temp 98.0°F | Ht 62.25 in | Wt 188.5 lb

## 2015-05-27 DIAGNOSIS — R7309 Other abnormal glucose: Secondary | ICD-10-CM | POA: Diagnosis not present

## 2015-05-27 DIAGNOSIS — I10 Essential (primary) hypertension: Secondary | ICD-10-CM

## 2015-05-27 DIAGNOSIS — E785 Hyperlipidemia, unspecified: Secondary | ICD-10-CM

## 2015-05-27 DIAGNOSIS — Z0000 Encounter for general adult medical examination without abnormal findings: Secondary | ICD-10-CM

## 2015-05-27 DIAGNOSIS — Z23 Encounter for immunization: Secondary | ICD-10-CM

## 2015-05-27 DIAGNOSIS — E871 Hypo-osmolality and hyponatremia: Secondary | ICD-10-CM | POA: Diagnosis not present

## 2015-05-27 DIAGNOSIS — E039 Hypothyroidism, unspecified: Secondary | ICD-10-CM

## 2015-05-27 DIAGNOSIS — E669 Obesity, unspecified: Secondary | ICD-10-CM

## 2015-05-27 DIAGNOSIS — Z1211 Encounter for screening for malignant neoplasm of colon: Secondary | ICD-10-CM | POA: Insufficient documentation

## 2015-05-27 DIAGNOSIS — Z7189 Other specified counseling: Secondary | ICD-10-CM | POA: Insufficient documentation

## 2015-05-27 LAB — BASIC METABOLIC PANEL
BUN: 13 mg/dL (ref 6–23)
CALCIUM: 9.3 mg/dL (ref 8.4–10.5)
CO2: 28 mEq/L (ref 19–32)
Chloride: 91 mEq/L — ABNORMAL LOW (ref 96–112)
Creatinine, Ser: 0.87 mg/dL (ref 0.40–1.20)
GFR: 65.7 mL/min (ref >60.00)
GLUCOSE: 100 mg/dL — AB (ref 70–99)
Potassium: 4.6 mEq/L (ref 3.5–5.1)
SODIUM: 127 meq/L — AB (ref 135–145)

## 2015-05-27 NOTE — Patient Instructions (Addendum)
Please do a stool kit for screening  Labs today to re check sodium and potassium  Flu shot today prevnar shot today  If you are interested in a shingles/zoster vaccine - call your insurance to check on coverage,( you should not get it within 1 month of other vaccines) , then call us for a prescription  for it to take to a pharmacy that gives the shot , or make a nurse visit to get it here depending on your coverage Don't forget to work on your advanced directive    Do limit fat and sugar in diet  Get back to using your exercise bike- for help with weight loss also

## 2015-05-27 NOTE — Assessment & Plan Note (Signed)
Stable with diet control Lab Results  Component Value Date   HGBA1C 6.2 05/21/2015   Enc wt loss and low glycemic diet to prevent DM

## 2015-05-27 NOTE — Assessment & Plan Note (Signed)
Remote hx of colon polyps  At 79 -does not want to do a colonoscopy Given IFOB kit

## 2015-05-27 NOTE — Progress Notes (Signed)
Pre visit review using our clinic review tool, if applicable. No additional management support is needed unless otherwise documented below in the visit note. 

## 2015-05-27 NOTE — Assessment & Plan Note (Signed)
Reviewed health habits including diet and exercise and skin cancer prevention Reviewed appropriate screening tests for age  Also reviewed health mt list, fam hx and immunization status , as well as social and family history   See HPI Labs reviewed PleLabs today to re check sodium and potassium  Flu shot today prevnar shot today  If you are interested in a shingles/zoster vaccine - call your insurance to check on coverage,( you should not get it within 1 month of other vaccines) , then call us for a prescription  for it to take to a pharmacy that gives the shot , or make a nurse visit to get it here depending on your coverage Don't forget to work on your advanced directive  Do limit fat and sugar in diet  Get back to using your exercise bike- for help with weight loss also ase do a stool kit for screening

## 2015-05-27 NOTE — Assessment & Plan Note (Signed)
Discussed how this problem influences overall health and the risks it imposes  Reviewed plan for weight loss with lower calorie diet (via better food choices and also portion control or program like weight watchers) and exercise building up to or more than 30 minutes 5 days per week including some aerobic activity    

## 2015-05-27 NOTE — Assessment & Plan Note (Signed)
No symptoms  Hope for imp off of hctz  Has to stay on lasix for CHF  Re check today

## 2015-05-27 NOTE — Assessment & Plan Note (Signed)
Disc goals for lipids and reasons to control them Rev labs with pt Rev low sat fat diet in detail Enc to watch sugar in diet for triglycerides

## 2015-05-27 NOTE — Assessment & Plan Note (Signed)
bp in fair control at this time  BP Readings from Last 1 Encounters:  05/27/15 128/68   No changes needed Disc lifstyle change with low sodium diet and exercise  This is stable even off hctz Bmp today for hyponatremia and hypokalemia

## 2015-05-27 NOTE — Assessment & Plan Note (Signed)
Hypothyroidism  Pt has no clinical changes No change in energy level/ hair or skin/ edema and no tremor Lab Results  Component Value Date   TSH 1.46 05/21/2015

## 2015-05-27 NOTE — Progress Notes (Signed)
Subjective:    Patient ID: Kimberly [name], female    DOB: 1927/08/26, 81 y.o.   MRN: [id number]  HPI  Here for annual medicare wellness visit as well as chronic/acute medical problems and also annual preventative exam  I have personally reviewed the Medicare Annual Wellness questionnaire and have noted 1. The patient's medical and social history 2. Their use of alcohol, tobacco or illicit drugs 3. Their current medications and supplements 4. The patient's functional ability including ADL's, fall risks, home safety risks and hearing or visual             impairment. 5. Diet and physical activities 6. Evidence for depression or mood disorders  The patients weight, height, BMI have been recorded in the chart and visual acuity is per eye clinic.  I have made referrals, counseling and provided education to the patient based review of the above and I have provided the pt with a written personalized care plan for preventive services. Reviewed and updated provider list, see scanned forms.  Is doing ok overall   See scanned forms.  Routine anticipatory guidance given to patient.  See health maintenance. Colon cancer screening 1/12 last one (routine - had polyps in the past)  Breast cancer screening - declines mammograms at her age  Self breast exam- no lumps  Flu vaccine-will get today  Tetanus vaccine - ? Due  Pneumovax 08, but prevnar is due now  Zoster vaccine - unsure if interested in a shingles shot  dexa - had one years ago / not interested in another one  Advance directive-does not have living will or POA  Cognitive function addressed- see scanned forms- and if abnormal then additional documentation follows.  Memory is fair overall - no major concerns   PMH and SH reviewed  Meds, vitals, and allergies reviewed.   ROS: See HPI.  Otherwise negative.    Wt is up about 6 lb  bmi of 34 Obese range Has not been exercising   Her son has cancer - stressed out / he is doing better -  she was helping out (? What kind of cancer)   Hypothyroidism  Pt has no clinical changes No change in energy level/ hair or skin/ edema and no tremor Lab Results  Component Value Date   TSH 1.46 05/21/2015     Hyperlipidemia  Lab Results  Component Value Date   CHOL 162 05/21/2015   CHOL 173 02/10/2011   CHOL 172 03/04/2010   Lab Results  Component Value Date   HDL 34.00* 05/21/2015   HDL 35.60* 02/10/2011   HDL 34.20* 03/04/2010   No results found for: [facility] Lab Results  Component Value Date   TRIG 237.0* 05/21/2015   TRIG 274.0* 02/10/2011   TRIG 317.0* 03/04/2010   Lab Results  Component Value Date   CHOLHDL 5 05/21/2015   CHOLHDL 5 02/10/2011   CHOLHDL 5 03/04/2010   Lab Results  Component Value Date   LDLDIRECT 96.0 05/21/2015   LDLDIRECT 108.0 02/10/2011   LDLDIRECT 96.1 03/04/2010    Will get back on her stationary bike  Does not eat a lot of sugar and fat   Hyperglycemia Lab Results  Component Value Date   HGBA1C 6.2 05/21/2015  overall stable  Does watch her sugar intake  bp is stable today  No cp or palpitations or headaches or edema  No side effects to medicines  BP Readings from Last 3 Encounters:  05/27/15 128/68  11/26/14 144/60  11/06/14  170/72     Sodium level was too low  inst to hold her hctz  A little swelling in ankles   K was slt low Pt does take it daily    Patient Active Problem List   Diagnosis Date Noted  . Medicare annual wellness visit, initial 05/27/2015  . Routine general medical examination at a health care facility 05/27/2015  . Colon cancer screening 05/27/2015  . Hyposmolality and/or hyponatremia 05/28/2014  . Hypokalemia 05/28/2014  . Nausea 05/28/2014  . Hypothyroid 05/28/2014  . Chest wall pain 04/27/2014  . Abdominal pain, acute, generalized 01/31/2014  . Acute diastolic heart failure ([place]) 01/31/2014  . Hyponatremia 01/31/2014  . Unspecified constipation 01/31/2014  . Acute diastolic CHF  (congestive heart failure) ([place]) 01/31/2014  . Thrush, oral 03/21/2013  . Left thyroid nodule 08/04/2012  . Obesity 12/04/2011  . Gastritis 12/04/2011  . Heart palpitations 09/01/2011  . Depression 05/26/2011  . Frequent UTI 12/30/2010  . Lower abdominal pain 11/27/2010  . Dysuria 11/26/2010  . COLONIC POLYPS 04/09/2010  . ANEMIA, MILD 04/09/2010  . HEMORRHOIDS 03/04/2010  . ANXIETY 12/10/2009  . PERSONAL HX COLONIC POLYPS 06/19/2008  . IRRITABLE BOWEL SYNDROME 03/28/2008  . 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 GOITER 03/29/2007  . Hypothyroidism 03/29/2007  . Hyperlipidemia 03/29/2007  . Essential hypertension 03/29/2007  . Coronary atherosclerosis 03/29/2007  . Bundle branch block 03/29/2007  . ALLERGIC RHINITIS 03/29/2007  . GERD 03/29/2007  . HYPERGLYCEMIA 03/29/2007   Past Medical History  Diagnosis Date  . Allergy     allergic rhinitis  . CAD (coronary artery disease)   . GERD (gastroesophageal reflux disease)   . Hyperlipidemia   . Hypertension   . Hypothyroid   . Insomnia   . CHF (congestive heart failure) [facility])    Past Surgical History  Procedure Laterality Date  . Oophorectomy    . Thyroid surgery    . Cardiac catheterization      [facility]  . Cardiac catheterization      [facility]   Social History  Substance Use Topics  . Smoking status: Never Smoker   . Smokeless tobacco: None  . Alcohol Use: No   Family History  Problem Relation Age of Onset  . Heart disease Mother   . Hypertension Mother    Allergies  Allergen Reactions  . Cephalexin     REACTION: rash and burning sensation to skin on arms and legs with itching.  . Ciprofloxacin     REACTION: burning of mouth and skin  . Elavil [Amitriptyline]     palpitation  . Lisinopril     REACTION: cough  . Penicillins     REACTION: rash  . Remeron [Mirtazapine]     constipation  . Sulfonamide Derivatives     REACTION: malaise  . Tetanus Toxoid     REACTION: swelling  . Vesicare [Solifenacin  Succinate] Other (See Comments)    Blurred vision and weakness  . Zoloft [Sertraline Hcl]     GI upset and nausea   Current Outpatient Prescriptions on File Prior to Visit  Medication Sig Dispense Refill  . amLODipine (NORVASC) 10 MG tablet Take 1 tablet (10 mg total) by mouth at bedtime. 30 tablet 11  . aspirin 81 MG tablet Take 81 mg by mouth daily.      . cetirizine (ZYRTEC) 10 MG tablet Take 10 mg by mouth daily as needed for allergies.     Marland Kitchen docusate sodium 100 MG CAPS Take 100 mg by mouth  2 (two) times daily. For constipation 60 capsule 3  . esomeprazole (NEXIUM) 40 MG capsule Take 1 capsule (40 mg total) by mouth daily. 90 capsule 3  . fluticasone (FLONASE) 50 MCG/ACT nasal spray Place 2 sprays into both nostrils daily. 48 g 3  . furosemide (LASIX) 20 MG tablet Take 1 tablet (20 mg total) by mouth daily. 90 tablet 3  . guaiFENesin (MUCINEX) 600 MG 12 hr tablet Take 600 mg by mouth 2 (two) times daily as needed for cough or to loosen phlegm.     Marland Kitchen levothyroxine (SYNTHROID, LEVOTHROID) 25 MCG tablet TAKE 1 TABLET BY MOUTH ONCE A DAY 30 tablet 1  . losartan (COZAAR) 100 MG tablet Take 1 tablet (100 mg total) by mouth daily. 90 tablet 1  . metoprolol (LOPRESSOR) 50 MG tablet Take 1 tablet (50 mg total) by mouth 2 (two) times daily. 180 tablet 3  . Multiple Vitamin (MULTIVITAMIN) capsule Take 1 capsule by mouth daily.      . nitrofurantoin, macrocrystal-monohydrate, (MACROBID) 100 MG capsule Take 1 capsule (100 mg total) by mouth 2 (two) times daily. 14 capsule 0  . potassium chloride SA (K-DUR,KLOR-CON) 20 MEQ tablet Take 1 tablet (20 mEq total) by mouth daily. 90 tablet 3  . zolpidem (AMBIEN) 10 MG tablet TAKE ONE-HALF(1/2) TO ONE(1) TABLET BY MOUTH AT BEDTIME AS NEEDED 30 tablet 3   No current facility-administered medications on file prior to visit.    Review of Systems Review of Systems  Constitutional: Negative for fever, appetite change, fatigue and unexpected weight change.    Eyes: Negative for pain and visual disturbance.  Respiratory: Negative for cough and shortness of breath.   Cardiovascular: Negative for cp or palpitations    Gastrointestinal: Negative for nausea, diarrhea and constipation.  Genitourinary: Negative for urgency and frequency.  Skin: Negative for pallor or rash   Neurological: Negative for weakness, light-headedness, numbness and headaches.  Hematological: Negative for adenopathy. Does not bruise/bleed easily.  Psychiatric/Behavioral: Negative for dysphoric mood. The patient is not nervous/anxious.         Objective:   Physical Exam  Constitutional: She appears well-developed and well-nourished. No distress.  Obese well appearing elderly female   HENT:  Head: Normocephalic and atraumatic.  Right Ear: External ear normal.  Left Ear: External ear normal.  Mouth/Throat: Oropharynx is clear and moist.  Eyes: Conjunctivae and EOM are normal. Pupils are equal, round, and reactive to light. No scleral icterus.  Neck: Normal range of motion. Neck supple. No JVD present. Carotid bruit is not present.  S/p R thyroidectomy  Cardiovascular: Normal rate, regular rhythm, normal heart sounds and intact distal pulses.  Exam reveals no gallop.   Pulmonary/Chest: Effort normal and breath sounds normal. No respiratory distress. She has no wheezes. She exhibits no tenderness.  Abdominal: Soft. Bowel sounds are normal. She exhibits no distension, no abdominal bruit and no mass. There is no tenderness.  Genitourinary: No breast swelling, tenderness, discharge or bleeding.  Musculoskeletal: Normal range of motion. She exhibits no edema or tenderness.  Lymphadenopathy:    She has no cervical adenopathy.  Neurological: She is alert. She has normal reflexes. No cranial nerve deficit. She exhibits normal muscle tone. Coordination normal.  Skin: Skin is warm and dry. No rash noted. No erythema. No pallor.  Psychiatric: She has a normal mood and affect.           Assessment & Plan:   Problem List Items Addressed This Visit  Cardiovascular and Mediastinum   Essential hypertension    bp in fair control at this time  BP Readings from Last 1 Encounters:  05/27/15 128/68   No changes needed Disc lifstyle change with low sodium diet and exercise  This is stable even off hctz Bmp today for hyponatremia and hypokalemia         Endocrine   Hypothyroidism    Hypothyroidism  Pt has no clinical changes No change in energy level/ hair or skin/ edema and no tremor Lab Results  Component Value Date   TSH 1.46 05/21/2015            Other   Colon cancer screening    Remote hx of colon polyps  At 87 -does not want to do a colonoscopy Given IFOB kit       Relevant Orders   Fecal occult blood, imunochemical   HYPERGLYCEMIA    Stable with diet control Lab Results  Component Value Date   HGBA1C 6.2 05/21/2015   Enc wt loss and low glycemic diet to prevent DM      Hyperlipidemia    Disc goals for lipids and reasons to control them Rev labs with pt Rev low sat fat diet in detail Enc to watch sugar in diet for triglycerides       Hyponatremia    No symptoms  Hope for imp off of hctz  Has to stay on lasix for CHF  Re check today      Relevant Orders   Basic metabolic panel (Completed)   Medicare annual wellness visit, initial - Primary    Reviewed health habits including diet and exercise and skin cancer prevention Reviewed appropriate screening tests for age  Also reviewed health mt list, fam hx and immunization status , as well as social and family history   See HPI Labs reviewed PleLabs today to re check sodium and potassium  Flu shot today prevnar shot today  If you are interested in a shingles/zoster vaccine - call your insurance to check on coverage,( you should not get it within 1 month of other vaccines) , then call us for a prescription  for it to take to a pharmacy that gives the shot , or make a  nurse visit to get it here depending on your coverage Don't forget to work on your advanced directive  Do limit fat and sugar in diet  Get back to using your exercise bike- for help with weight loss also ase do a stool kit for screening       Obesity    Discussed how this problem influences overall health and the risks it imposes  Reviewed plan for weight loss with lower calorie diet (via better food choices and also portion control or program like weight watchers) and exercise building up to or more than 30 minutes 5 days per week including some aerobic activity         Routine general medical examination at a health care facility    Reviewed health habits including diet and exercise and skin cancer prevention Reviewed appropriate screening tests for age  Also reviewed health mt list, fam hx and immunization status , as well as social and family history   See HPI Labs reviewed PleLabs today to re check sodium and potassium  Flu shot today prevnar shot today  If you are interested in a shingles/zoster vaccine - call your insurance to check on coverage,( you should not get it within 1 month of other  vaccines) , then call us for a prescription  for it to take to a pharmacy that gives the shot , or make a nurse visit to get it here depending on your coverage Don't forget to work on your advanced directive  Do limit fat and sugar in diet  Get back to using your exercise bike- for help with weight loss also ase do a stool kit for screening         Other Visit Diagnoses    Need for vaccination with 13-polyvalent pneumococcal conjugate vaccine        Relevant Orders    Pneumococcal conjugate vaccine 13-valent (Completed)    Need for influenza vaccination        Relevant Orders    Flu Vaccine QUAD 36+ mos PF IM (Fluarix & Fluzone Quad PF) (Completed)

## 2015-06-15 ENCOUNTER — Other Ambulatory Visit: Payer: Self-pay | Admitting: Family Medicine

## 2015-06-17 ENCOUNTER — Ambulatory Visit: Payer: Medicare Other | Admitting: Cardiovascular Disease

## 2015-06-20 ENCOUNTER — Other Ambulatory Visit: Payer: Self-pay | Admitting: Family Medicine

## 2015-06-20 DIAGNOSIS — E871 Hypo-osmolality and hyponatremia: Secondary | ICD-10-CM

## 2015-06-21 ENCOUNTER — Ambulatory Visit (INDEPENDENT_AMBULATORY_CARE_PROVIDER_SITE_OTHER): Payer: Medicare Other | Admitting: Cardiovascular Disease

## 2015-06-21 ENCOUNTER — Encounter: Payer: Self-pay | Admitting: Cardiovascular Disease

## 2015-06-21 VITALS — BP 120/84 | HR 71 | Ht 63.5 in | Wt 184.0 lb

## 2015-06-21 DIAGNOSIS — E785 Hyperlipidemia, unspecified: Secondary | ICD-10-CM

## 2015-06-21 DIAGNOSIS — I25119 Atherosclerotic heart disease of native coronary artery with unspecified angina pectoris: Secondary | ICD-10-CM | POA: Diagnosis not present

## 2015-06-21 DIAGNOSIS — I5031 Acute diastolic (congestive) heart failure: Secondary | ICD-10-CM

## 2015-06-21 DIAGNOSIS — I454 Nonspecific intraventricular block: Secondary | ICD-10-CM

## 2015-06-21 DIAGNOSIS — E669 Obesity, unspecified: Secondary | ICD-10-CM | POA: Diagnosis not present

## 2015-06-21 DIAGNOSIS — E871 Hypo-osmolality and hyponatremia: Secondary | ICD-10-CM

## 2015-06-21 DIAGNOSIS — I10 Essential (primary) hypertension: Secondary | ICD-10-CM

## 2015-06-21 NOTE — Assessment & Plan Note (Signed)
Cholesterol at reasonable range, not on a statin #Been decreasing over the past 8 years

## 2015-06-21 NOTE — Assessment & Plan Note (Signed)
Currently with no symptoms of angina. No further workup at this time. Continue current medication regimen. 

## 2015-06-21 NOTE — Patient Instructions (Signed)
You are doing well. No medication changes were made.  Please take extra lasix as needed with potassium after lunch for shortness of breath, hand swelling  Please call us if you have new issues that need to be addressed before your next appt.  Your physician wants you to follow-up in: 12 months.  You will receive a reminder letter in the mail two months in advance. If you don't receive a letter, please call our office to schedule the follow-up appointment.

## 2015-06-21 NOTE — Progress Notes (Signed)
HPI Comments: Ms. Covault is a pleasant 81 year-old woman with prior history of chest pain, cardiac catheterization in 2001 and 2006 with no significant CAD,negative stress test in may 2011, with recent admission to [facility] July 1 with discharge [id number] with diastolic CHF, acute. She presents for routine followup of her chest pain and diastolic CHF  In follow-up today, She is doing relatively well Hands are more swollen, increasing shortness of breath since she stopped the HCTZ HCTZ was held for low sodium. Without HCTZ, sodium has slowly trended upwards Recheck scheduled for 11 days from now Otherwise she denies any significant problems, minimal leg edema Not very active, reports that she feels her weight is going up but review of prior weights shows minimal weight change, still 182 up to 184 pounds  EKG on today's visit shows normal sinus rhythm with rate 71 bpm, left bundle branch block  Other past medical history  01/31/2014 with tightness in her chest, shortness of breath.  diagnosed with diastolic CHF, She had significant diuresis in the hospital. BNP 1900, cardiac enzymes negative for MI Echocardiogram showed normal LV systolic function, diastolic dysfunction noted  She was discharged on Lasix 20 mg daily. Weight when she left the hospital was 178 to 180 pounds She denies having any significant lower extremity edema She states that she's not sleeping well. Fatigue since she left the hospital  Previous basic metabolic panel in July [id number] showing sodium 127, low potassium   Allergies  Allergen Reactions  . Cephalexin     REACTION: rash and burning sensation to skin on arms and legs with itching.  . Ciprofloxacin     REACTION: burning of mouth and skin  . Elavil [Amitriptyline]     palpitation  . Lisinopril     REACTION: cough  . Penicillins     REACTION: rash  . Remeron [Mirtazapine]     constipation  . Sulfonamide Derivatives     REACTION: malaise  . Tetanus Toxoid      REACTION: swelling  . Vesicare [Solifenacin Succinate] Other (See Comments)    Blurred vision and weakness  . Zoloft [Sertraline Hcl]     GI upset and nausea    Outpatient Encounter Prescriptions as of 06/21/2015  Medication Sig  . amLODipine (NORVASC) 10 MG tablet Take 1 tablet (10 mg total) by mouth at bedtime.  Marland Kitchen aspirin 81 MG tablet Take 81 mg by mouth daily.    . cetirizine (ZYRTEC) 10 MG tablet Take 10 mg by mouth daily as needed for allergies.   Marland Kitchen docusate sodium 100 MG CAPS Take 100 mg by mouth 2 (two) times daily. For constipation  . esomeprazole (NEXIUM) 40 MG capsule Take 1 capsule (40 mg total) by mouth daily.  . fluticasone (FLONASE) 50 MCG/ACT nasal spray Place 2 sprays into both nostrils daily.  . furosemide (LASIX) 20 MG tablet Take 1 tablet (20 mg total) by mouth daily.  Marland Kitchen guaiFENesin (MUCINEX) 600 MG 12 hr tablet Take 600 mg by mouth 2 (two) times daily as needed for cough or to loosen phlegm.   Marland Kitchen levothyroxine (SYNTHROID, LEVOTHROID) 25 MCG tablet TAKE 1 TABLET BY MOUTH ONCE A DAY  . losartan (COZAAR) 100 MG tablet Take 1 tablet (100 mg total) by mouth daily.  . metoprolol (LOPRESSOR) 50 MG tablet Take 1 tablet (50 mg total) by mouth 2 (two) times daily.  . Multiple Vitamin (MULTIVITAMIN) capsule Take 1 capsule by mouth daily.    . nitrofurantoin, macrocrystal-monohydrate, (MACROBID) 100 MG capsule  Take 1 capsule (100 mg total) by mouth 2 (two) times daily.  . potassium chloride SA (K-DUR,KLOR-CON) 20 MEQ tablet Take 1 tablet (20 mEq total) by mouth daily.  Marland Kitchen zolpidem (AMBIEN) 10 MG tablet TAKE ONE-HALF(1/2) TO ONE(1) TABLET BY MOUTH AT BEDTIME AS NEEDED   No facility-administered encounter medications on file as of 06/21/2015.    Past Medical History  Diagnosis Date  . Allergy     allergic rhinitis  . CAD (coronary artery disease)   . GERD (gastroesophageal reflux disease)   . Hyperlipidemia   . Hypertension   . Hypothyroid   . Insomnia   . CHF  (congestive heart failure) [facility])     Past Surgical History  Procedure Laterality Date  . Oophorectomy    . Thyroid surgery    . Cardiac catheterization      [facility]  . Cardiac catheterization      [facility]    Social History  reports that she has never smoked. She does not have any smokeless tobacco history on file. She reports that she does not drink alcohol or use illicit drugs.  Family History family history includes Heart disease in her mother; Hypertension in her mother.   Review of Systems  HENT: Negative.   Respiratory: Positive for shortness of breath.   Cardiovascular: Negative.   Gastrointestinal: Negative.   Musculoskeletal: Negative.   Neurological: Negative.   Hematological: Negative.   Psychiatric/Behavioral: Negative.   All other systems reviewed and are negative.  BP 120/84 mmHg  Pulse 71  Ht 5' 3.5" (1.613 m)  Wt 184 lb (83.462 kg)  BMI 32.08 kg/m2  LMP 08/03/1978   Objective:   Physical Exam  Constitutional: She is oriented to person, place, and time. She appears well-developed and well-nourished. No distress.  obese and well appearing   HENT:  Head: Normocephalic and atraumatic.  Nose: Nose normal.  Mouth/Throat: Oropharynx is clear and moist.  Eyes: Conjunctivae and EOM are normal. Pupils are equal, round, and reactive to light. No scleral icterus.  Neck: Normal range of motion. Neck supple. No JVD present. Carotid bruit is not present. No thyromegaly present.  Cardiovascular: Normal rate, regular rhythm, S1 normal, S2 normal, normal heart sounds and intact distal pulses.  Exam reveals no gallop and no friction rub.   No murmur heard. Pulmonary/Chest: Effort normal and breath sounds normal. No respiratory distress. She has no wheezes. She has no rales. She exhibits no tenderness.  Abdominal: Soft. Bowel sounds are normal. She exhibits no distension and no mass. There is no tenderness.  Musculoskeletal: Normal range of motion. She exhibits no edema or  tenderness.  Lymphadenopathy:    She has no cervical adenopathy.  Neurological: She is alert and oriented to person, place, and time. She has normal reflexes. No cranial nerve deficit. She exhibits normal muscle tone. Coordination normal.  Skin: Skin is warm and dry. No rash noted. No erythema. No pallor.  Psychiatric: Her speech is normal and behavior is normal. Judgment and thought content normal. Her mood appears anxious. Her affect is not blunt, not labile and not inappropriate. Cognition and memory are normal.  Mildly anxious - but pleasant and talkative   Nursing note and vitals reviewed.

## 2015-06-21 NOTE — Assessment & Plan Note (Signed)
Repeat lab work scheduled for next week and follow-up with primary care Recommended she continue to hold HCTZ, will take extra Lasix after lunch when necessary for shortness of breath symptoms

## 2015-06-21 NOTE — Assessment & Plan Note (Signed)
We have encouraged continued exercise, careful diet management in an effort to lose weight. 

## 2015-06-21 NOTE — Assessment & Plan Note (Signed)
Blood pressure is well controlled on today's visit. No changes made to the medications. 

## 2015-06-21 NOTE — Assessment & Plan Note (Signed)
Without her HCTZ she is concerned about finger swelling, increasing shortness of breath Weight has not changed very much compared to prior clinic visit We have recommended she take extra Lasix after lunch only as needed for shortness of breath symptoms We'll continue to hold HCTZ

## 2015-07-02 ENCOUNTER — Other Ambulatory Visit (INDEPENDENT_AMBULATORY_CARE_PROVIDER_SITE_OTHER): Payer: Medicare Other

## 2015-07-02 DIAGNOSIS — E871 Hypo-osmolality and hyponatremia: Secondary | ICD-10-CM | POA: Diagnosis not present

## 2015-07-02 LAB — BASIC METABOLIC PANEL
BUN: 16 mg/dL (ref 6–23)
CO2: 29 meq/L (ref 19–32)
CREATININE: 1.04 mg/dL (ref 0.40–1.20)
Calcium: 9.1 mg/dL (ref 8.4–10.5)
Chloride: 97 mEq/L (ref 96–112)
GFR: 53.46 mL/min — ABNORMAL LOW (ref >60.00)
GLUCOSE: 85 mg/dL (ref 70–99)
Potassium: 4.6 mEq/L (ref 3.5–5.1)
SODIUM: 134 meq/L — AB (ref 135–145)

## 2015-07-03 ENCOUNTER — Encounter: Payer: Self-pay | Admitting: *Deleted

## 2015-07-17 ENCOUNTER — Other Ambulatory Visit: Payer: Self-pay | Admitting: Family Medicine

## 2015-07-17 NOTE — Telephone Encounter (Signed)
Refilled on 03/26/15 #30 with 3 additional refills, ? If to early don't think it's due until 07/25/15, please advise

## 2015-07-17 NOTE — Telephone Encounter (Signed)
That is early- please check in with pt to see what the deal is  Thanks

## 2015-07-18 NOTE — Telephone Encounter (Signed)
Left message for patient to return phone call.  

## 2015-07-19 ENCOUNTER — Other Ambulatory Visit: Payer: Self-pay | Admitting: Family Medicine

## 2015-07-19 ENCOUNTER — Other Ambulatory Visit: Payer: Self-pay

## 2015-07-19 NOTE — Telephone Encounter (Signed)
See 07/17/15 phone note.

## 2015-07-19 NOTE — Telephone Encounter (Signed)
[facility] requesting status of ambien refill.

## 2015-07-19 NOTE — Telephone Encounter (Signed)
Spoke to Dr Glori Bickers regarding pts refill. Pt is unable to explain as to why she only has 2tabs left. Per Dr Glori Bickers, Rx is to be denied and is to not be filled before 12/22. Per pharmacy, pt has been filling Rx "a couple days early each time" and should have enough to last through 12/22. 2nd request denied

## 2015-07-25 ENCOUNTER — Other Ambulatory Visit: Payer: Self-pay | Admitting: Family Medicine

## 2015-07-25 NOTE — Telephone Encounter (Signed)
Px written for call in   

## 2015-07-25 NOTE — Telephone Encounter (Signed)
Rx called in prescribed.Marland KitchenCarol Ada

## 2015-07-25 NOTE — Telephone Encounter (Signed)
Electronic refill request, pt had CPE on 05/27/15, last refilled on 03/26/15 #30 with 3 refill, please advise

## 2015-07-25 NOTE — Telephone Encounter (Signed)
Pt left v/m requesting status of ambien refill; per DPR left detailed v/m that med already called in to pharmacy today at 2:49.

## 2015-08-21 ENCOUNTER — Other Ambulatory Visit: Payer: Self-pay | Admitting: Family Medicine

## 2015-10-13 ENCOUNTER — Other Ambulatory Visit: Payer: Self-pay | Admitting: Family Medicine

## 2015-10-21 ENCOUNTER — Other Ambulatory Visit: Payer: Self-pay | Admitting: *Deleted

## 2015-10-21 MED ORDER — POTASSIUM CHLORIDE CRYS ER 20 MEQ PO TBCR: 20.0000 meq | EXTENDED_RELEASE_TABLET | Freq: Every day | ORAL | Status: DC

## 2015-11-11 ENCOUNTER — Other Ambulatory Visit: Payer: Self-pay | Admitting: Family Medicine

## 2015-11-26 ENCOUNTER — Other Ambulatory Visit: Payer: Self-pay | Admitting: Family Medicine

## 2015-11-26 NOTE — Telephone Encounter (Signed)
Px written for call in   

## 2015-11-26 NOTE — Telephone Encounter (Signed)
Pt had CPE on 05/27/15, last refilled on 07/25/15 #30 tabs with 3 additional refills, please advise

## 2015-11-26 NOTE — Telephone Encounter (Signed)
Rx called in as prescribed 

## 2015-12-10 ENCOUNTER — Other Ambulatory Visit: Payer: Self-pay | Admitting: Family Medicine

## 2016-02-05 ENCOUNTER — Other Ambulatory Visit: Payer: Self-pay | Admitting: Family Medicine

## 2016-02-05 ENCOUNTER — Other Ambulatory Visit: Payer: Self-pay | Admitting: *Deleted

## 2016-02-05 ENCOUNTER — Other Ambulatory Visit: Payer: Self-pay

## 2016-02-05 ENCOUNTER — Telehealth: Payer: Self-pay | Admitting: Cardiovascular Disease

## 2016-02-05 MED ORDER — AMLODIPINE BESYLATE 10 MG PO TABS: 10.0000 mg | ORAL_TABLET | Freq: Every day | ORAL | Status: DC

## 2016-02-05 MED ORDER — FUROSEMIDE 20 MG PO TABS: ORAL_TABLET | ORAL | Status: DC

## 2016-02-05 NOTE — Telephone Encounter (Signed)
°*  STAT* If patient is at the pharmacy, call can be transferred to refill team.   1. Which medications need to be refilled? (please list name of each medication and dose if known) amlodpine 10 mg   2. Which pharmacy/location (including street and city if local pharmacy) is [facility] to [facility] sent to[facility] [place]   3. Do they need a 30 day or 90 day supply? 90 day

## 2016-03-11 ENCOUNTER — Other Ambulatory Visit: Payer: Self-pay | Admitting: *Deleted

## 2016-03-11 MED ORDER — METOPROLOL TARTRATE 50 MG PO TABS: ORAL_TABLET | ORAL | 0 refills | Status: DC

## 2016-03-11 NOTE — Telephone Encounter (Signed)
Fax refill request, no recent f/u or CPE, please advise

## 2016-03-11 NOTE — Telephone Encounter (Signed)
Lattie Haw will schedule the appt and I filled the med

## 2016-03-11 NOTE — Telephone Encounter (Signed)
She is due for AMW and annual visit Nov or later Please schedule that with lab prior and refill until then Thanks

## 2016-03-12 ENCOUNTER — Telehealth: Payer: Self-pay

## 2016-03-12 NOTE — Telephone Encounter (Signed)
Kimberly [name] with [facility] [place] left v/m requesting clarification of metoprolol 50 mg; is it tartrate or succinate. Anna request cb.

## 2016-03-12 NOTE — Telephone Encounter (Signed)
Tartrate please-thanks

## 2016-03-12 NOTE — Telephone Encounter (Signed)
Left voicemail letting Kimberly [name] know it's the tartrate

## 2016-03-20 ENCOUNTER — Other Ambulatory Visit: Payer: Self-pay | Admitting: Family Medicine

## 2016-03-20 IMAGING — CR DG CHEST 2V
2 series · 2 of 2 positions shown · non-contrast
Comparison: 01/31/2014.

CLINICAL DATA: Right chest wall pain.

EXAM:
CHEST  2 VIEW

[view not recorded (1 of 2)]
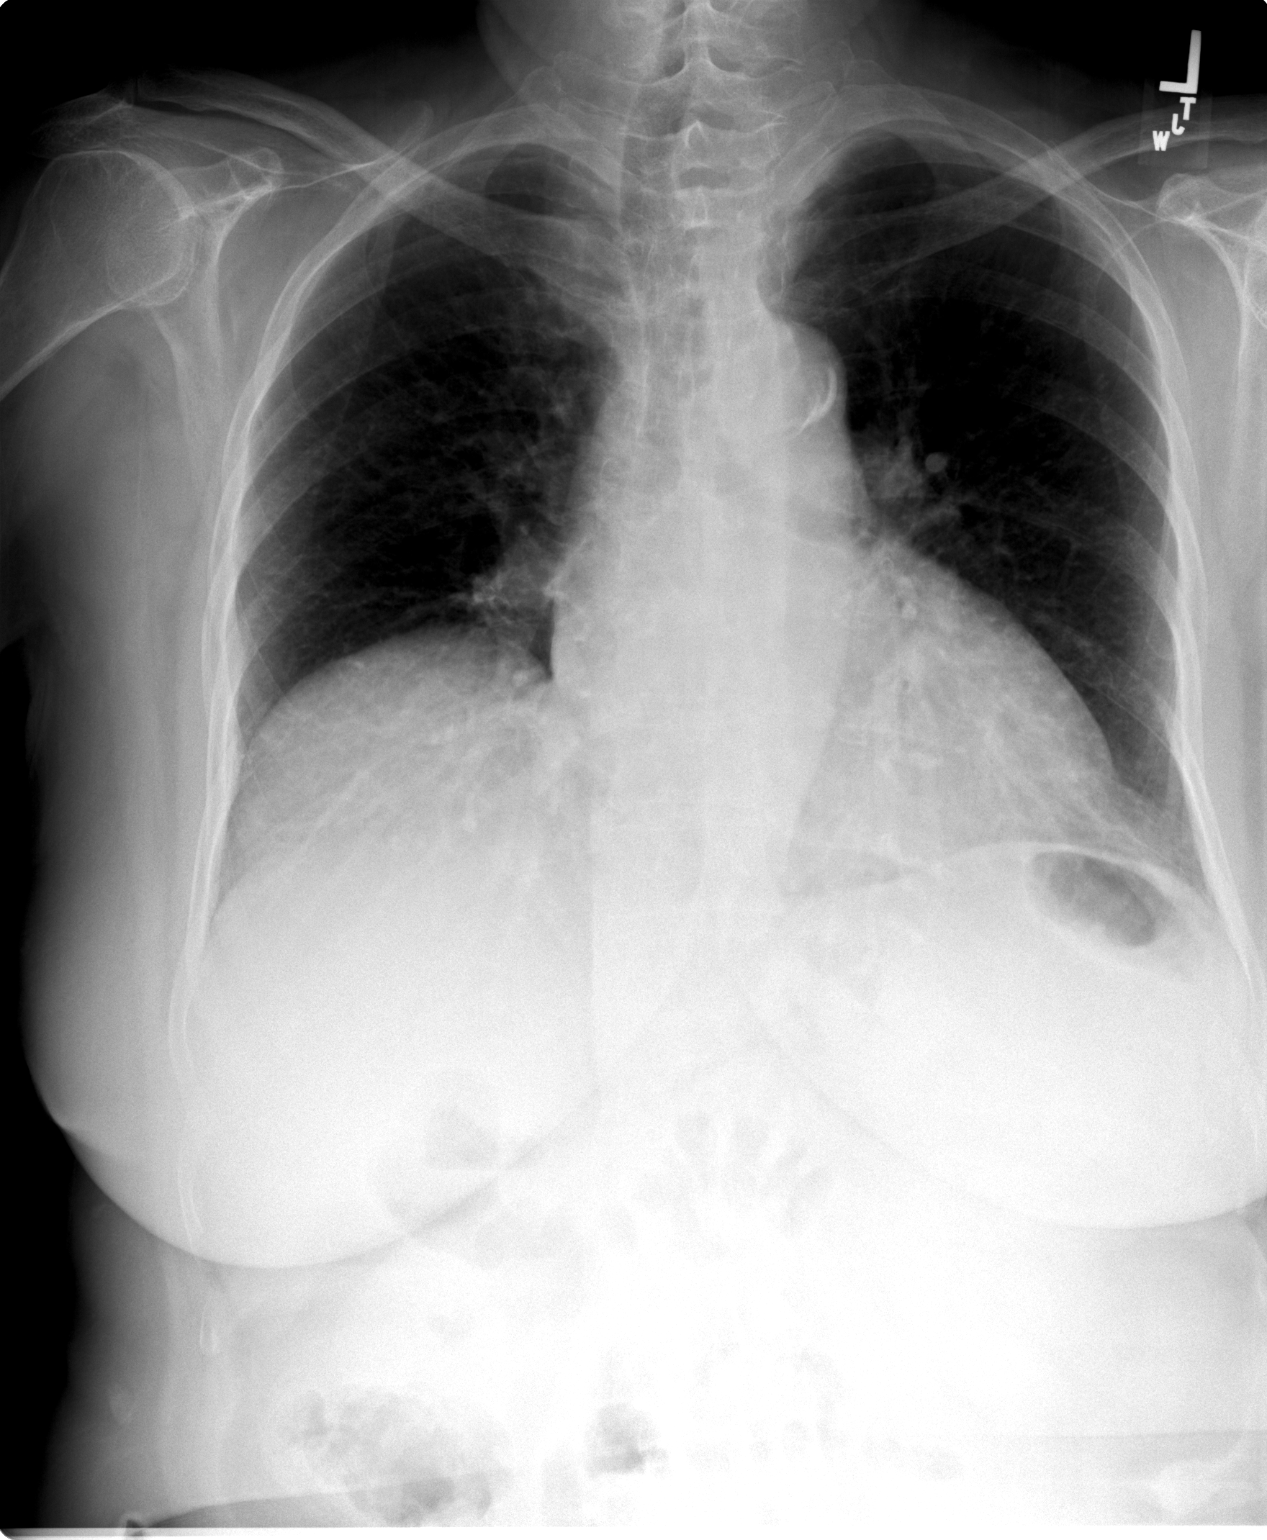

[view not recorded (2 of 2)]
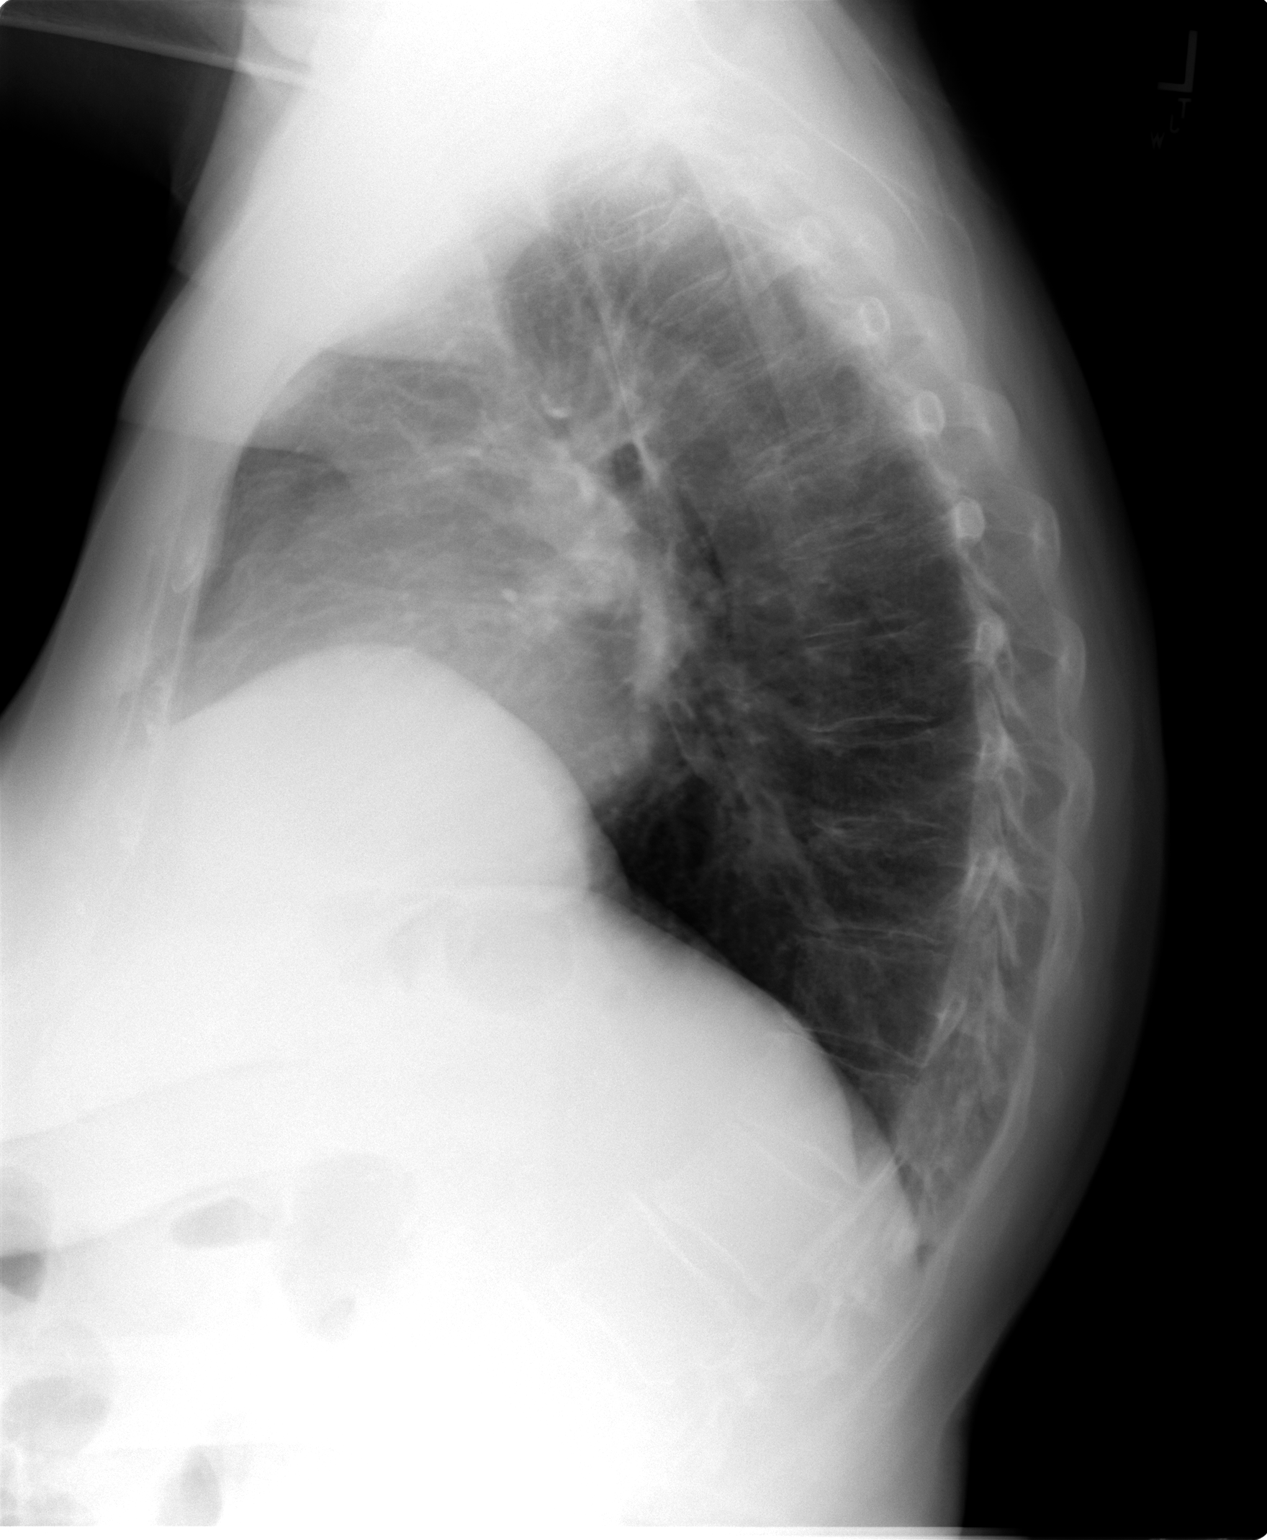

[2 of 2 positions shown; findings below may reference images not displayed]

FINDINGS: Mediastinum and hilar structures are normal. Mild basilar
atelectasis. Cardiomegaly. Pulmonary vascularity is normal. No
pleural effusion or pneumothorax. Diffuse osteopenia degenerative
change.
IMPRESSION: 1. Cardiomegaly, no CHF.

2. No acute pulmonary disease.

## 2016-03-20 NOTE — Telephone Encounter (Signed)
Pt had CPE on 05/27/15, no appt since, last filled on 11/26/15 #30 with 3 additional refills, please advise

## 2016-03-20 NOTE — Telephone Encounter (Signed)
Medication phoned to pharmacy.  

## 2016-03-20 NOTE — Telephone Encounter (Signed)
Px written for call in   

## 2016-04-14 ENCOUNTER — Other Ambulatory Visit: Payer: Self-pay | Admitting: Family Medicine

## 2016-04-14 NOTE — Telephone Encounter (Signed)
No recent or future appts., please advise  

## 2016-04-14 NOTE — Telephone Encounter (Signed)
Info given to [place] to schedule appt and med refilled

## 2016-04-14 NOTE — Telephone Encounter (Signed)
Please schedule AMW with Katha Cabal and then 30 min annual exam with me - labs prior Refill until then Winter is fine

## 2016-04-24 ENCOUNTER — Other Ambulatory Visit: Payer: Self-pay | Admitting: Family Medicine

## 2016-04-25 ENCOUNTER — Other Ambulatory Visit: Payer: Self-pay | Admitting: Family Medicine

## 2016-04-25 NOTE — Telephone Encounter (Signed)
Last filled on 03/20/16 #30 with 3 additional refills, but pharmacy is saying that they didn't get the additional refills on that Rx so they only got the Rx on 03/20/16 for #30 with no refills, please advise

## 2016-04-26 NOTE — Telephone Encounter (Signed)
Px written for call in   

## 2016-04-27 ENCOUNTER — Ambulatory Visit (INDEPENDENT_AMBULATORY_CARE_PROVIDER_SITE_OTHER): Payer: Medicare Other | Admitting: Family Medicine

## 2016-04-27 ENCOUNTER — Encounter: Payer: Self-pay | Admitting: Family Medicine

## 2016-04-27 VITALS — BP 134/62 | HR 88 | Temp 98.0°F | Ht 62.25 in | Wt 182.2 lb

## 2016-04-27 DIAGNOSIS — R51 Headache: Secondary | ICD-10-CM

## 2016-04-27 DIAGNOSIS — R519 Headache, unspecified: Secondary | ICD-10-CM

## 2016-04-27 NOTE — Progress Notes (Signed)
Subjective:    Patient ID: Kimberly [name], female    DOB: [birth year], 82 y.o.   MRN: [id number]  HPI Here with headaches   Has had a headache for 3 days  Started when she was sitting and doing nothing  R sided and top of her head (went from the top down behind her eye)  Not throbbing (is constant)  No prior hx of migraine  It does make her feel mildly nauseated     Felt funny - right before the headache  Not light headed No vertigo symptoms   No slurred speech  No facial droop     Some sinus symptoms - stuffy nose  No purulent drainage  No ST or fever   Has not been using flonase   BP Readings from Last 3 Encounters:  04/27/16 134/62  06/21/15 120/84  05/27/15 128/68      Stressed out-son has cancer - going through a lot   Patient Active Problem List   Diagnosis Date Noted  . Medicare annual wellness visit, initial 05/27/2015  . Routine general medical examination at a health care facility 05/27/2015  . Colon cancer screening 05/27/2015  . Hyposmolality and/or hyponatremia 05/28/2014  . Hypokalemia 05/28/2014  . Nausea 05/28/2014  . Hypothyroid 05/28/2014  . Chest wall pain 04/27/2014  . Abdominal pain, acute, generalized 01/31/2014  . Acute diastolic heart failure ([place]) 01/31/2014  . Hyponatremia 01/31/2014  . Unspecified constipation 01/31/2014  . Acute diastolic CHF (congestive heart failure) ([place]) 01/31/2014  . Thrush, oral 03/21/2013  . Left thyroid nodule 08/04/2012  . Obesity 12/04/2011  . Gastritis 12/04/2011  . Heart palpitations 09/01/2011  . Depression 05/26/2011  . Frequent UTI 12/30/2010  . Lower abdominal pain 11/27/2010  . Dysuria 11/26/2010  . COLONIC POLYPS 04/09/2010  . ANEMIA, MILD 04/09/2010  . HEMORRHOIDS 03/04/2010  . ANXIETY 12/10/2009  . PERSONAL HX COLONIC POLYPS 06/19/2008  . IRRITABLE BOWEL SYNDROME 03/28/2008  . 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 GOITER 03/29/2007  . Hypothyroidism 03/29/2007  .  Hyperlipidemia 03/29/2007  . Essential hypertension 03/29/2007  . Coronary atherosclerosis 03/29/2007  . Bundle branch block 03/29/2007  . ALLERGIC RHINITIS 03/29/2007  . GERD 03/29/2007  . HYPERGLYCEMIA 03/29/2007   Past Medical History:  Diagnosis Date  . Allergy    allergic rhinitis  . CAD (coronary artery disease)   . CHF (congestive heart failure) ([place])   . GERD (gastroesophageal reflux disease)   . Hyperlipidemia   . Hypertension   . Hypothyroid   . Insomnia    Past Surgical History:  Procedure Laterality Date  . CARDIAC CATHETERIZATION     [place]    . THYROID SURGERY     Social History  Substance Use Topics  . Smoking status: Never Smoker  . Smokeless tobacco: Never Used  . Alcohol use No   Family History  Problem Relation Age of Onset  . Heart disease Mother   . Hypertension Mother    Allergies  Allergen Reactions  . Cephalexin     REACTION: rash and burning sensation to skin on arms and legs with itching.  . Ciprofloxacin     REACTION: burning of mouth and skin  . Elavil [Amitriptyline]     palpitation  . Lisinopril     REACTION: cough  . Penicillins     REACTION: rash  . Remeron [Mirtazapine]     constipation  . Sulfonamide Derivatives  REACTION: malaise  . Tetanus Toxoid     REACTION: swelling  . Vesicare [Solifenacin Succinate] Other (See Comments)    Blurred vision and weakness  . Zoloft [Sertraline Hcl]     GI upset and nausea   Current Outpatient Prescriptions on File Prior to Visit  Medication Sig Dispense Refill  . amLODipine (NORVASC) 10 MG tablet Take 1 tablet (10 mg total) by mouth at bedtime. 90 tablet 3  . aspirin 81 MG tablet Take 81 mg by mouth daily.      . cetirizine (ZYRTEC) 10 MG tablet Take 10 mg by mouth daily as needed for allergies.     Marland Kitchen docusate sodium 100 MG CAPS Take 100 mg by mouth 2 (two) times daily. For constipation 60 capsule 3  . esomeprazole (NEXIUM) 40 MG  capsule TAKE 1 CAPSULE (40 MG TOTAL) BY MOUTH DAILY. 90 capsule 1  . fluticasone (FLONASE) 50 MCG/ACT nasal spray Place 2 sprays into both nostrils daily. 48 g 3  . furosemide (LASIX) 20 MG tablet TAKE 1 TABLET (20 MG TOTAL) BY MOUTH DAILY. 90 tablet 0  . guaiFENesin (MUCINEX) 600 MG 12 hr tablet Take 600 mg by mouth 2 (two) times daily as needed for cough or to loosen phlegm.     Marland Kitchen levothyroxine (SYNTHROID, LEVOTHROID) 25 MCG tablet TAKE 1 TABLET BY MOUTH ONCE A DAY 30 tablet 10  . losartan (COZAAR) 100 MG tablet TAKE 1 TABLET (100 MG TOTAL) BY MOUTH DAILY. 90 tablet 0  . metoprolol (LOPRESSOR) 50 MG tablet TAKE 1 TABLET (50 MG TOTAL) BY MOUTH 2 (TWO) TIMES DAILY. 180 tablet 0  . Multiple Vitamin (MULTIVITAMIN) capsule Take 1 capsule by mouth daily.      . nitrofurantoin, macrocrystal-monohydrate, (MACROBID) 100 MG capsule Take 1 capsule (100 mg total) by mouth 2 (two) times daily. 14 capsule 0  . potassium chloride SA (K-DUR,KLOR-CON) 20 MEQ tablet Take 1 tablet (20 mEq total) by mouth daily. 90 tablet 3  . zolpidem (AMBIEN) 10 MG tablet TAKE 1/2 TO 1 TABLET BY MOUTH AT BEDTIME AS NEEDED 30 tablet 3   No current facility-administered medications on file prior to visit.     Review of Systems Review of Systems  Constitutional: Negative for fever, appetite change,  and unexpected weight change.  Eyes: Negative for pain and visual disturbance.  Respiratory: Negative for cough and shortness of breath.   Cardiovascular: Negative for cp or palpitations    Gastrointestinal: Negative for nausea, diarrhea and constipation.  Genitourinary: Negative for urgency and frequency.  Skin: Negative for pallor or rash   Neurological: Negative for weakness, light-headedness, numbness and pos for headaches.  Hematological: Negative for adenopathy. Does not bruise/bleed easily.  Psychiatric/Behavioral: Negative for dysphoric mood. The patient is not nervous/anxious. Pos for fatigue and worry with stressors          Objective:   Physical Exam  Constitutional: She is oriented to person, place, and time. She appears well-developed and well-nourished. No distress.  Well appearing elderly female   HENT:  Head: Normocephalic and atraumatic.  Right Ear: External ear normal.  Left Ear: External ear normal.  Nose: Nose normal.  Mouth/Throat: Oropharynx is clear and moist. No oropharyngeal exudate.  No sinus tenderness No temporal tenderness  No TMJ tenderness  Eyes: Conjunctivae and EOM are normal. Pupils are equal, round, and reactive to light. Right eye exhibits no discharge. Left eye exhibits no discharge. No scleral icterus.  No nystagmus  Neck: Normal range of motion and full  passive range of motion without pain. Neck supple. No JVD present. Carotid bruit is not present. No tracheal deviation present. No thyromegaly present.  Cardiovascular: Normal rate, regular rhythm and normal heart sounds.   No murmur heard. Pulmonary/Chest: Effort normal and breath sounds normal. No respiratory distress. She has no wheezes. She has no rales.  Abdominal: Soft. Bowel sounds are normal. She exhibits no distension and no mass. There is no tenderness.  Musculoskeletal: She exhibits no edema or tenderness.  Lymphadenopathy:    She has no cervical adenopathy.  Neurological: She is alert and oriented to person, place, and time. She has normal strength and normal reflexes. She displays no atrophy and no tremor. No cranial nerve deficit or sensory deficit. She exhibits normal muscle tone. She displays a negative Romberg sign. Coordination and gait normal.  No focal cerebellar signs   Skin: Skin is warm and dry. No rash noted. No pallor.  Psychiatric: She has a normal mood and affect. Her behavior is normal. Thought content normal.  Supportive family present  Pt is mentally sharp          Assessment & Plan:   Problem List Items Addressed This Visit      Other   Headache    New headache w/o other neuro  symptoms in 82 yo without prior headaches CT of head w/o contrast ordered in light of age Re assuring exam  This may be tension/stress related Disc hydration /sleep/caff avoidance and hydration  Tylenol prn  Further plan after CT result  inst to go to ED or call EMS if sudden worsening  >25 minutes spent in face to face time with patient, >50% spent in counselling or coordination of care       Relevant Orders   CT Head Wo Contrast    Other Visit Diagnoses   None.

## 2016-04-27 NOTE — Assessment & Plan Note (Addendum)
New headache w/o other neuro symptoms in 82 yo without prior headaches CT of head w/o contrast ordered in light of age Re assuring exam  This may be tension/stress related Disc hydration /sleep/caff avoidance and hydration  Tylenol prn  Further plan after CT result  inst to go to ED or call EMS if sudden worsening  >25 minutes spent in face to face time with patient, >50% spent in counselling or coordination of care

## 2016-04-27 NOTE — Patient Instructions (Signed)
Drink lots of water  Avoid caffeine  Try to go to bed and get up at the same time every day  Tylenol regular strength 2 pills up to every 4-6 hours is ok for pain  Also try cold compress on head and neck  Try to relax  Talk about stressful situations with friends and family   Stop at check out for referral for CT

## 2016-04-27 NOTE — Telephone Encounter (Signed)
Rx called in as prescribed 

## 2016-04-28 ENCOUNTER — Ambulatory Visit (INDEPENDENT_AMBULATORY_CARE_PROVIDER_SITE_OTHER)
Admission: RE | Admit: 2016-04-28 | Discharge: 2016-04-28 | Disposition: A | Discharge status: Home / Self Care | Payer: Medicare Other | Source: Ambulatory Visit | Attending: Family Medicine | Admitting: Family Medicine

## 2016-04-28 DIAGNOSIS — R519 Headache, unspecified: Secondary | ICD-10-CM

## 2016-04-28 DIAGNOSIS — R51 Headache: Secondary | ICD-10-CM | POA: Diagnosis not present

## 2016-04-29 ENCOUNTER — Telehealth: Payer: Self-pay | Admitting: Family Medicine

## 2016-04-29 DIAGNOSIS — I6529 Occlusion and stenosis of unspecified carotid artery: Secondary | ICD-10-CM | POA: Insufficient documentation

## 2016-04-29 DIAGNOSIS — I6523 Occlusion and stenosis of bilateral carotid arteries: Secondary | ICD-10-CM

## 2016-04-29 NOTE — Telephone Encounter (Signed)
-----   Message from Kimberly [name], [place] sent at 04/29/2016  9:26 AM EDT ----- Pt notified of CT results and Dr. Marliss Coots comments. Pt said her HA is doing a lot better today. Pt does agree with carotid doppler test, please put referral in and I advise pt our [facility] will call to schedule that appt

## 2016-04-29 NOTE — Telephone Encounter (Signed)
Glad the headache is improved I did order for the carotid US Will route to [facility] Thanks

## 2016-04-30 ENCOUNTER — Inpatient Hospital Stay: Admission: RE | Admit: 2016-04-30 | Payer: Medicare Other | Source: Ambulatory Visit

## 2016-04-30 NOTE — Telephone Encounter (Signed)
Appt made for October 20th at 11:15am. [facility]

## 2016-05-01 ENCOUNTER — Other Ambulatory Visit: Payer: Self-pay | Admitting: Family Medicine

## 2016-05-07 ENCOUNTER — Other Ambulatory Visit: Payer: Self-pay | Admitting: Family Medicine

## 2016-05-22 ENCOUNTER — Ambulatory Visit: Payer: Medicare Other

## 2016-05-22 DIAGNOSIS — I6523 Occlusion and stenosis of bilateral carotid arteries: Secondary | ICD-10-CM | POA: Diagnosis not present

## 2016-06-07 ENCOUNTER — Other Ambulatory Visit: Payer: Self-pay | Admitting: Family Medicine

## 2016-06-08 ENCOUNTER — Telehealth: Payer: Self-pay | Admitting: Family Medicine

## 2016-06-08 DIAGNOSIS — R7309 Other abnormal glucose: Secondary | ICD-10-CM

## 2016-06-08 DIAGNOSIS — E7800 Pure hypercholesterolemia, unspecified: Secondary | ICD-10-CM

## 2016-06-08 DIAGNOSIS — E039 Hypothyroidism, unspecified: Secondary | ICD-10-CM

## 2016-06-08 DIAGNOSIS — I10 Essential (primary) hypertension: Secondary | ICD-10-CM

## 2016-06-08 NOTE — Telephone Encounter (Signed)
-----   Message from Eustace Pen, LPN sent at [phone]  4:45 PM EST ----- Regarding: Please place lab orders for 06/09/16. Please place lab orders. Thank you.

## 2016-06-09 ENCOUNTER — Ambulatory Visit (INDEPENDENT_AMBULATORY_CARE_PROVIDER_SITE_OTHER): Payer: Medicare Other

## 2016-06-09 VITALS — BP 124/76 | HR 71 | Temp 98.4°F | Ht 62.0 in | Wt 185.0 lb

## 2016-06-09 DIAGNOSIS — E7800 Pure hypercholesterolemia, unspecified: Secondary | ICD-10-CM | POA: Diagnosis not present

## 2016-06-09 DIAGNOSIS — I10 Essential (primary) hypertension: Secondary | ICD-10-CM | POA: Diagnosis not present

## 2016-06-09 DIAGNOSIS — Z23 Encounter for immunization: Secondary | ICD-10-CM | POA: Diagnosis not present

## 2016-06-09 DIAGNOSIS — Z0000 Encounter for general adult medical examination without abnormal findings: Secondary | ICD-10-CM

## 2016-06-09 DIAGNOSIS — E039 Hypothyroidism, unspecified: Secondary | ICD-10-CM | POA: Diagnosis not present

## 2016-06-09 DIAGNOSIS — R7309 Other abnormal glucose: Secondary | ICD-10-CM | POA: Diagnosis not present

## 2016-06-09 LAB — CBC WITH DIFFERENTIAL/PLATELET
BASOS ABS: 0 10*3/uL (ref 0.0–0.1)
Basophils Relative: 0.5 % (ref 0.0–3.0)
EOS PCT: 1.7 % (ref 0.0–5.0)
Eosinophils Absolute: 0.1 10*3/uL (ref 0.0–0.7)
HEMATOCRIT: 33 % — AB (ref 36.0–46.0)
Hemoglobin: 11.1 g/dL — ABNORMAL LOW (ref 12.0–15.0)
LYMPHS PCT: 19.5 % (ref 12.0–46.0)
Lymphs Abs: 1.6 10*3/uL (ref 0.7–4.0)
MCHC: 33.5 g/dL (ref 30.0–36.0)
MCV: 85.3 fl (ref 78.0–100.0)
MONOS PCT: 10.8 % (ref 3.0–12.0)
Monocytes Absolute: 0.9 10*3/uL (ref 0.1–1.0)
NEUTROS ABS: 5.4 10*3/uL (ref 1.4–7.7)
Neutrophils Relative %: 67.5 % (ref 43.0–77.0)
Platelets: 318 10*3/uL (ref 150.0–400.0)
RBC: 3.87 Mil/uL (ref 3.87–5.11)
RDW: 13.9 % (ref 11.5–15.5)
WBC: 8 10*3/uL (ref 4.0–10.5)

## 2016-06-09 LAB — COMPREHENSIVE METABOLIC PANEL
ALK PHOS: 104 U/L (ref 39–117)
ALT: 10 U/L (ref 0–35)
AST: 16 U/L (ref 0–37)
Albumin: 4.1 g/dL (ref 3.5–5.2)
BILIRUBIN TOTAL: 0.5 mg/dL (ref 0.2–1.2)
BUN: 12 mg/dL (ref 6–23)
CALCIUM: 9.3 mg/dL (ref 8.4–10.5)
CO2: 29 mEq/L (ref 19–32)
Chloride: 96 mEq/L (ref 96–112)
Creatinine, Ser: 1.08 mg/dL (ref 0.40–1.20)
GFR: 51.07 mL/min — AB (ref >60.00)
Glucose, Bld: 103 mg/dL — ABNORMAL HIGH (ref 70–99)
POTASSIUM: 4.6 meq/L (ref 3.5–5.1)
Sodium: 133 mEq/L — ABNORMAL LOW (ref 135–145)
TOTAL PROTEIN: 7.5 g/dL (ref 6.0–8.3)

## 2016-06-09 LAB — HEMOGLOBIN A1C: Hgb A1c MFr Bld: 5.9 % (ref 4.6–6.5)

## 2016-06-09 LAB — LDL CHOLESTEROL, DIRECT: Direct LDL: 100 mg/dL

## 2016-06-09 LAB — LIPID PANEL
CHOLESTEROL: 190 mg/dL (ref 0–200)
HDL: 34.2 mg/dL — ABNORMAL LOW (ref >39.00)
Total CHOL/HDL Ratio: 6

## 2016-06-09 LAB — TSH: TSH: 1.35 u[IU]/mL (ref 0.35–4.50)

## 2016-06-09 NOTE — Progress Notes (Signed)
PCP notes:   Health maintenance:  Flu vaccine - administered  Abnormal screenings:   Hearing - failed Mini-Cog score: 18/20  Patient concerns:   None  Nurse concerns:  None  Next PCP appt:   06/16/16 @ 1215 I reviewed health advisor's note, was available for consultation, and agree with documentation and plan. Loura Pardon MD

## 2016-06-09 NOTE — Progress Notes (Signed)
Subjective:   Kimberly [name] is a 82 y.o. female who presents for Medicare Annual (Subsequent) preventive examination.  Review of Systems:  N/A Cardiac Risk Factors include: advanced age (>81men, >7 women);obesity (BMI >30kg/m2);dyslipidemia;hypertension;sedentary lifestyle     Objective:     Vitals: BP 124/76 (BP Location: Right Arm, Patient Position: Sitting, Cuff Size: Normal)   Pulse 71   Temp 98.4 F (36.9 C) (Oral)   Ht 5\' 2"  (1.575 m) Comment: no shoes  Wt 185 lb (83.9 kg)   LMP 08/03/1978   SpO2 96%   BMI 33.84 kg/m   Body mass index is 33.84 kg/m.   Tobacco History  Smoking Status  . Never Smoker  Smokeless Tobacco  . Never Used     Counseling given: No   Past Medical History:  Diagnosis Date  . Allergy    allergic rhinitis  . CAD (coronary artery disease)   . CHF (congestive heart failure) ([place])   . GERD (gastroesophageal reflux disease)   . Hyperlipidemia   . Hypertension   . Hypothyroid   . Insomnia    Past Surgical History:  Procedure Laterality Date  . CARDIAC CATHETERIZATION     [place]    . THYROID SURGERY     Family History  Problem Relation Age of Onset  . Heart disease Mother   . Hypertension Mother    History  Sexual Activity  . Sexual activity: No    Outpatient Encounter Prescriptions as of 06/09/2016  Medication Sig  . amLODipine (NORVASC) 10 MG tablet Take 1 tablet (10 mg total) by mouth at bedtime.  Marland Kitchen aspirin 81 MG tablet Take 81 mg by mouth daily.    . cetirizine (ZYRTEC) 10 MG tablet Take 10 mg by mouth daily as needed for allergies.   Marland Kitchen docusate sodium 100 MG CAPS Take 100 mg by mouth 2 (two) times daily. For constipation  . esomeprazole (NEXIUM) 40 MG capsule TAKE 1 CAPSULE (40 MG TOTAL) BY MOUTH DAILY.  . fluticasone (FLONASE) 50 MCG/ACT nasal spray Place 2 sprays into both nostrils daily.  . furosemide (LASIX) 20 MG tablet TAKE 1 TABLET (20 MG TOTAL) BY MOUTH DAILY.  Marland Kitchen  guaiFENesin (MUCINEX) 600 MG 12 hr tablet Take 600 mg by mouth 2 (two) times daily as needed for cough or to loosen phlegm.   Marland Kitchen levothyroxine (SYNTHROID, LEVOTHROID) 25 MCG tablet TAKE 1 TABLET BY MOUTH ONCE A DAY  . losartan (COZAAR) 100 MG tablet TAKE 1 TABLET (100 MG TOTAL) BY MOUTH DAILY.  . metoprolol (LOPRESSOR) 50 MG tablet TAKE 1 TABLET BY MOUTH TWICE A DAY  . Multiple Vitamin (MULTIVITAMIN) capsule Take 1 capsule by mouth daily.    . potassium chloride SA (K-DUR,KLOR-CON) 20 MEQ tablet Take 1 tablet (20 mEq total) by mouth daily.  Marland Kitchen zolpidem (AMBIEN) 10 MG tablet TAKE 1/2 TO 1 TABLET BY MOUTH AT BEDTIME AS NEEDED  . [DISCONTINUED] nitrofurantoin, macrocrystal-monohydrate, (MACROBID) 100 MG capsule Take 1 capsule (100 mg total) by mouth 2 (two) times daily.   No facility-administered encounter medications on file as of 06/09/2016.     Activities of Daily Living In your present state of health, do you have any difficulty performing the following activities: 06/09/2016  Hearing? N  Vision? N  Difficulty concentrating or making decisions? N  Walking or climbing stairs? Y  Dressing or bathing? N  Doing errands, shopping? Y  Preparing Food and eating ?  N  Using the Toilet? N  In the past six months, have you accidently leaked urine? Y  Do you have problems with loss of bowel control? N  Managing your Medications? N  Managing your Finances? N  Housekeeping or managing your Housekeeping? N  Some recent data might be hidden    Patient Care Team: Abner Greenspan, MD as PCP - General    Assessment:     Hearing Screening   125Hz  250Hz  500Hz  1000Hz  2000Hz  3000Hz  4000Hz  6000Hz  8000Hz   Right ear:   0 40 40  0    Left ear:   40 40 40  40      Visual Acuity Screening   Right eye Left eye Both eyes  Without correction:     With correction: 20/40 20/30 20/30     Exercise Activities and Dietary recommendations Current Exercise Habits: The patient does not participate in regular  exercise at present, Exercise limited by: None identified  Goals    . Increase physical activity          Starting 06/10/2016, I will begin using stationary bike for 10-15 minutes daily.       Fall Risk Fall Risk  06/09/2016 05/27/2015  Falls in the past year? No No   Depression Screen PHQ 2/9 Scores 06/09/2016 05/27/2015  PHQ - 2 Score 0 0     Cognitive Function MMSE - Mini Mental State Exam 06/09/2016  Orientation to time 5  Orientation to Place 5  Registration 3  Attention/ Calculation 0  Recall 2  Recall-comments pt was unable to recall 1 of 3 words  Language- name 2 objects 0  Language- repeat 1  Language- follow 3 step command 2  Language- follow 3 step command-comments pt was unable to follow 1 step of 3 step command  Language- read & follow direction 0  Write a sentence 0  Copy design 0  Total score 18     PLEASE NOTE: A Mini-Cog screen was completed. Maximum score is 20. A value of 0 denotes this part of [place] MMSE was not completed or the patient failed this part of the Mini-Cog screening.   Mini-Cog Screening Orientation to Time - Max 5 pts Orientation to Place - Max 5 pts Registration - Max 3 pts Recall - Max 3 pts Language Repeat - Max 1 pts Language Follow 3 Step Command - Max 3 pts     Immunization History  Administered Date(s) Administered  . Influenza Split 05/26/2011, 04/20/2012  . Influenza Whole 06/02/2007, 05/28/2008  . Influenza,inj,Quad PF,36+ Mos 05/27/2015, 06/09/2016  . Influenza-Unspecified 06/03/2013  . Pneumococcal Conjugate-13 05/27/2015  . Pneumococcal Polysaccharide-23 06/02/2007   Screening Tests Health Maintenance  Topic Date Due  . DEXA SCAN  09/04/2022 (Originally 12/08/1994)  . ZOSTAVAX  09/04/2022 (Originally 12/07/1989)  . TETANUS/TDAP  09/04/2022 (Originally 12/07/1946)  . INFLUENZA VACCINE  Completed  . PNA vac Low Risk Adult  Completed      Plan:     I have personally reviewed and addressed the Medicare Annual  Wellness questionnaire and have noted the following in the patient's chart:  A. Medical and social history B. Use of alcohol, tobacco or illicit drugs  C. Current medications and supplements D. Functional ability and status E.  Nutritional status F.  Physical activity G. Advance directives H. List of other physicians I.  Hospitalizations, surgeries, and ER visits in previous 12 months J.  [place] to include hearing, vision, cognitive, depression L. Referrals and appointments -  none  In addition, I have reviewed and discussed with patient certain preventive protocols, quality metrics, and best practice recommendations. A written personalized care plan for preventive services as well as general preventive health recommendations were provided to patient.  See attached scanned questionnaire for additional information.   Signed,   Lindell Noe, MHA, BS, LPN Health Coach

## 2016-06-09 NOTE — Progress Notes (Signed)
Pre visit review using our clinic review tool, if applicable. No additional management support is needed unless otherwise documented below in the visit note. 

## 2016-06-09 NOTE — Patient Instructions (Signed)
Kimberly [name] , Thank you for taking time to come for your Medicare Wellness Visit. I appreciate your ongoing commitment to your health goals. Please review the following plan we discussed and let me know if I can assist you in the future.   These are the goals we discussed: Goals    . Increase physical activity          Starting 06/10/2016, I will begin using stationary bike for 10-15 minutes daily.        This is a list of the screening recommended for you and due dates:  Health Maintenance  Topic Date Due  . DEXA scan (bone density measurement)  09/04/2022*  . Shingles Vaccine  09/04/2022*  . Tetanus Vaccine  09/04/2022*  . Flu Shot  Completed  . Pneumonia vaccines  Completed  *Topic was postponed. The date shown is not the original due date.   Preventive Care for Adults  A healthy lifestyle and preventive care can promote health and wellness. Preventive health guidelines for adults include the following key practices.  . A routine yearly physical is a good [place] to check with your health care provider about your health and preventive screening. It is a chance to share any concerns and updates on your health and to receive a thorough exam.  . Visit your dentist for a routine exam and preventive care every 6 months. Brush your teeth twice a day and floss once a day. Good oral hygiene prevents tooth decay and gum disease.  . The frequency of eye exams is based on your age, health, family medical history, use  of contact lenses, and other factors. Follow your health care provider's ecommendations for frequency of eye exams.  . Eat a healthy diet. Foods like vegetables, fruits, whole grains, low-fat dairy products, and lean protein foods contain the nutrients you need without too many calories. Decrease your intake of foods high in solid fats, added sugars, and salt. Eat the right amount of calories for you. Get information about a proper diet from your health care provider, if  necessary.  . Regular physical exercise is one of the most important things you can do for your health. Most adults should get at least 150 minutes of moderate-intensity exercise (any activity that increases your heart rate and causes you to sweat) each week. In addition, most adults need muscle-strengthening exercises on 2 or more days a week.  Silver Sneakers may be a benefit available to you. To determine eligibility, you may visit the website: www.silversneakers.com or contact program at 646-460-8706 Mon-Fri between 8AM-8PM.   . Maintain a healthy weight. The body mass index (BMI) is a screening tool to identify possible weight problems. It provides an estimate of body fat based on height and weight. Your health care provider can find your BMI and can help you achieve or maintain a healthy weight.   For adults 20 years and older: ? A BMI below 18.5 is considered underweight. ? A BMI of 18.5 to 24.9 is normal. ? A BMI of 25 to 29.9 is considered overweight. ? A BMI of 30 and above is considered obese.   . Maintain normal blood lipids and cholesterol levels by exercising and minimizing your intake of saturated fat. Eat a balanced diet with plenty of fruit and vegetables. Blood tests for lipids and cholesterol should begin at age 89 and be repeated every 5 years. If your lipid or cholesterol levels are high, you are over 50, or you are at high risk  for heart disease, you may need your cholesterol levels checked more frequently. Ongoing high lipid and cholesterol levels should be treated with medicines if diet and exercise are not working.  . If you smoke, find out from your health care provider how to quit. If you do not use tobacco, please do not start.  . If you choose to drink alcohol, please do not consume more than 2 drinks per day. One drink is considered to be 12 ounces (355 mL) of beer, 5 ounces (148 mL) of wine, or 1.5 ounces (44 mL) of liquor.  . If you are 60-18 years old, ask your  health care provider if you should take aspirin to prevent strokes.  . Use sunscreen. Apply sunscreen liberally and repeatedly throughout the day. You should seek shade when your shadow is shorter than you. Protect yourself by wearing long sleeves, pants, a wide-brimmed hat, and sunglasses year round, whenever you are outdoors.  . Once a month, do a whole body skin exam, using a mirror to look at the skin on your back. Tell your health care provider of new moles, moles that have irregular borders, moles that are larger than a pencil eraser, or moles that have changed in shape or color.

## 2016-06-16 ENCOUNTER — Encounter: Payer: Self-pay | Admitting: Family Medicine

## 2016-06-16 ENCOUNTER — Ambulatory Visit (INDEPENDENT_AMBULATORY_CARE_PROVIDER_SITE_OTHER): Payer: Medicare Other | Admitting: Family Medicine

## 2016-06-16 VITALS — BP 130/70 | HR 72 | Temp 98.3°F | Ht 62.0 in | Wt 184.5 lb

## 2016-06-16 DIAGNOSIS — I10 Essential (primary) hypertension: Secondary | ICD-10-CM

## 2016-06-16 DIAGNOSIS — E039 Hypothyroidism, unspecified: Secondary | ICD-10-CM

## 2016-06-16 DIAGNOSIS — E7800 Pure hypercholesterolemia, unspecified: Secondary | ICD-10-CM | POA: Diagnosis not present

## 2016-06-16 DIAGNOSIS — D649 Anemia, unspecified: Secondary | ICD-10-CM

## 2016-06-16 DIAGNOSIS — I5031 Acute diastolic (congestive) heart failure: Secondary | ICD-10-CM

## 2016-06-16 DIAGNOSIS — R300 Dysuria: Secondary | ICD-10-CM

## 2016-06-16 DIAGNOSIS — E041 Nontoxic single thyroid nodule: Secondary | ICD-10-CM

## 2016-06-16 DIAGNOSIS — R7309 Other abnormal glucose: Secondary | ICD-10-CM

## 2016-06-16 DIAGNOSIS — R8290 Unspecified abnormal findings in urine: Secondary | ICD-10-CM

## 2016-06-16 DIAGNOSIS — I6523 Occlusion and stenosis of bilateral carotid arteries: Secondary | ICD-10-CM

## 2016-06-16 DIAGNOSIS — Z6833 Body mass index (BMI) 33.0-33.9, adult: Secondary | ICD-10-CM

## 2016-06-16 DIAGNOSIS — E6609 Other obesity due to excess calories: Secondary | ICD-10-CM

## 2016-06-16 LAB — POC URINALSYSI DIPSTICK (AUTOMATED)
Bilirubin, UA: NEGATIVE
GLUCOSE UA: NEGATIVE
Ketones, UA: NEGATIVE
NITRITE UA: NEGATIVE
Protein, UA: 15
RBC UA: NEGATIVE
Spec Grav, UA: 1.02
UROBILINOGEN UA: 0.2
pH, UA: 6

## 2016-06-16 MED ORDER — FUROSEMIDE 20 MG PO TABS: ORAL_TABLET | ORAL | 3 refills | Status: DC

## 2016-06-16 MED ORDER — LEVOTHYROXINE SODIUM 25 MCG PO TABS: 25.0000 ug | ORAL_TABLET | Freq: Every day | ORAL | 3 refills | Status: AC

## 2016-06-16 MED ORDER — LOSARTAN POTASSIUM 100 MG PO TABS: ORAL_TABLET | ORAL | 3 refills | Status: DC

## 2016-06-16 NOTE — Assessment & Plan Note (Signed)
Pt states she has done well lately-no sob and wt is up 2 lb since sept

## 2016-06-16 NOTE — Assessment & Plan Note (Signed)
Suspect mild anemia of chronic dz in elderly female No symptoms  Continue to watch

## 2016-06-16 NOTE — Assessment & Plan Note (Signed)
bp in fair control at this time  BP Readings from Last 1 Encounters:  06/16/16 130/70   No changes needed Disc lifstyle change with low sodium diet and exercise  Labs reviewed

## 2016-06-16 NOTE — Progress Notes (Signed)
Subjective:    Patient ID: Kimberly [name], female    DOB: [birth year], 82 y.o.   MRN: [id number]  HPI Here for annual f/u of chronic medical problems   Has ongoing sinus problems   Also thinks she has symptoms of uti  Took AZO otc  Is burning to urinate  Discomfort and bloating over bladder  Has hx of frequent uti -but none since 4/16  Results for orders placed or performed in visit on 06/16/16  POCT Urinalysis Dipstick (Automated)  Result Value Ref Range   Color, UA Yellow    Clarity, UA Hazy    Glucose, UA Negative    Bilirubin, UA Negative    Ketones, UA Negative    Spec Grav, UA 1.020    Blood, UA Negative    pH, UA 6.0    Protein, UA 15 mg/dL    Urobilinogen, UA 0.2    Nitrite, UA Negative    Leukocytes, UA small (1+) (A) Negative      Had AMW earlier this month  Had her flu vaccine   Failed hearing screen- right ear at 500 and 4000 Hz She has not noticed it and it does not bother her  No one has mentioned it to her   Mini cog score 18/20-unable to recall 1 of 3 words and unable to follow 1 step of a commend  She notices some of short term memory problems  Everyday things escape her occ- so she writes everything down on a pad  occ mis places things-not often  Walks in room and forgets why  Has not missed medicine  No episodes of confusion , does not forget how to use machines or appliances  Has not left water or stove running  She socializes every day with family/ neighbors/ and daughter for the most part  Has a 72 yo grandson across the st- she sees him a lot     Wt Readings from Last 3 Encounters:  06/16/16 184 lb 8 oz (83.7 kg)  06/09/16 185 lb (83.9 kg)  04/27/16 182 lb 4 oz (82.7 kg)  this is stable  Diet is not perfect- weakness is cookies  Avoids junk food  Does not eat out much  Not much exercise - thinks she could do more "I am lazy" She tries to use her exercise bike daily for 10 minutes  bmi is 33.7  Declines dexa- she declines  No falls  or fractures in the past year  Takes her ca and D daily  Also some exercise   Zoster vaccine - declines due to lack of coverage   Breast cancer screening - a very long time since a mammogram and she declines them due to age No lumps on self exam   Colonoscopy 2012 nl    Hx of HTN in setting of CAD and hx of CHF bp is stable today  No cp or palpitations or headaches or edema  No side effects to medicines  BP Readings from Last 3 Encounters:  06/16/16 130/70  06/09/16 124/76  04/27/16 134/62      Hypothyroidism  Pt has no clinical changes No change in energy level/ hair or skin/ edema and no tremor Lab Results  Component Value Date   TSH 1.35 06/09/2016     Hx of thyroid nodules and surgery in the past  Is not seeing thyroid specialist currently  Does not feel like her neck has changed   Hx of hyperlipidemia Lab Results  Component Value  Date   CHOL 190 06/09/2016   CHOL 162 05/21/2015   CHOL 173 02/10/2011   Lab Results  Component Value Date   HDL 34.20 (L) 06/09/2016   HDL 34.00 (L) 05/21/2015   HDL 35.60 (L) 02/10/2011   No results found for: [facility] Lab Results  Component Value Date   TRIG (H) 06/09/2016    404.0 Triglyceride is over 400; calculations on Lipids are invalid.   TRIG 237.0 (H) 05/21/2015   TRIG 274.0 (H) 02/10/2011   Lab Results  Component Value Date   CHOLHDL 6 06/09/2016   CHOLHDL 5 05/21/2015   CHOLHDL 5 02/10/2011   Lab Results  Component Value Date   LDLDIRECT 100.0 06/09/2016   LDLDIRECT 96.0 05/21/2015   LDLDIRECT 108.0 02/10/2011   she thinks she was NOT fasting for her labs  Avoids fried foods Does not like greasy/fatty foods   Results for orders placed or performed in visit on 06/09/16  CBC with Differential/Platelet  Result Value Ref Range   WBC 8.0 4.0 - 10.5 K/uL   RBC 3.87 3.87 - 5.11 Mil/uL   Hemoglobin 11.1 (L) 12.0 - 15.0 g/dL   HCT 33.0 (L) 36.0 - 46.0 %   MCV 85.3 78.0 - 100.0 fl   MCHC 33.5 30.0 - 36.0  g/dL   RDW 13.9 11.5 - 15.5 %   Platelets 318.0 150.0 - 400.0 K/uL   Neutrophils Relative % 67.5 43.0 - 77.0 %   Lymphocytes Relative 19.5 12.0 - 46.0 %   Monocytes Relative 10.8 3.0 - 12.0 %   Eosinophils Relative 1.7 0.0 - 5.0 %   Basophils Relative 0.5 0.0 - 3.0 %   Neutro Abs 5.4 1.4 - 7.7 K/uL   Lymphs Abs 1.6 0.7 - 4.0 K/uL   Monocytes Absolute 0.9 0.1 - 1.0 K/uL   Eosinophils Absolute 0.1 0.0 - 0.7 K/uL   Basophils Absolute 0.0 0.0 - 0.1 K/uL  Comprehensive metabolic panel  Result Value Ref Range   Sodium 133 (L) 135 - 145 mEq/L   Potassium 4.6 3.5 - 5.1 mEq/L   Chloride 96 96 - 112 mEq/L   CO2 29 19 - 32 mEq/L   Glucose, Bld 103 (H) 70 - 99 mg/dL   BUN 12 6 - 23 mg/dL   Creatinine, Ser 1.08 0.40 - 1.20 mg/dL   Total Bilirubin 0.5 0.2 - 1.2 mg/dL   Alkaline Phosphatase 104 39 - 117 U/L   AST 16 0 - 37 U/L   ALT 10 0 - 35 U/L   Total Protein 7.5 6.0 - 8.3 g/dL   Albumin 4.1 3.5 - 5.2 g/dL   Calcium 9.3 8.4 - 10.5 mg/dL   GFR 51.07 (L) >60.00 mL/min  Hemoglobin A1c  Result Value Ref Range   Hgb A1c MFr Bld 5.9 4.6 - 6.5 %  Lipid panel  Result Value Ref Range   Cholesterol 190 0 - 200 mg/dL   Triglycerides (H) 0.0 - 149.0 mg/dL    404.0 Triglyceride is over 400; calculations on Lipids are invalid.   HDL 34.20 (L) >39.00 mg/dL   Total CHOL/HDL Ratio 6   TSH  Result Value Ref Range   TSH 1.35 0.35 - 4.50 uIU/mL  LDL cholesterol, direct  Result Value Ref Range   Direct LDL 100.0 mg/dL   anemia is mild and stable   Hx of hyperglycemia Lab Results  Component Value Date   HGBA1C 5.9 06/09/2016   This is down -well controlled  Patient Active Problem List  Diagnosis Date Noted  . Carotid atherosclerosis 04/29/2016  . Medicare annual wellness visit, initial 05/27/2015  . Routine general medical examination at a health care facility 05/27/2015  . Colon cancer screening 05/27/2015  . Hyposmolality and/or hyponatremia 05/28/2014  . Hypokalemia 05/28/2014  .  Acute diastolic heart failure ([place]) 01/31/2014  . Hyponatremia 01/31/2014  . Unspecified constipation 01/31/2014  . Acute diastolic CHF (congestive heart failure) ([place]) 01/31/2014  . Left thyroid nodule 08/04/2012  . Obesity 12/04/2011  . Gastritis 12/04/2011  . Heart palpitations 09/01/2011  . Depression 05/26/2011  . Frequent UTI 12/30/2010  . Lower abdominal pain 11/27/2010  . Dysuria 11/26/2010  . COLONIC POLYPS 04/09/2010  . Anemia 04/09/2010  . HEMORRHOIDS 03/04/2010  . ANXIETY 12/10/2009  . PERSONAL HX COLONIC POLYPS 06/19/2008  . IRRITABLE BOWEL SYNDROME 03/28/2008  . 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 GOITER 03/29/2007  . Hypothyroidism 03/29/2007  . Hyperlipidemia 03/29/2007  . Essential hypertension 03/29/2007  . Coronary atherosclerosis 03/29/2007  . Bundle branch block 03/29/2007  . ALLERGIC RHINITIS 03/29/2007  . GERD 03/29/2007  . HYPERGLYCEMIA 03/29/2007   Past Medical History:  Diagnosis Date  . Allergy    allergic rhinitis  . CAD (coronary artery disease)   . CHF (congestive heart failure) ([place])   . GERD (gastroesophageal reflux disease)   . Hyperlipidemia   . Hypertension   . Hypothyroid   . Insomnia    Past Surgical History:  Procedure Laterality Date  . CARDIAC CATHETERIZATION     [place]    . THYROID SURGERY     Social History  Substance Use Topics  . Smoking status: Never Smoker  . Smokeless tobacco: Never Used  . Alcohol use No   Family History  Problem Relation Age of Onset  . Heart disease Mother   . Hypertension Mother    Allergies  Allergen Reactions  . Cephalexin     REACTION: rash and burning sensation to skin on arms and legs with itching.  . Ciprofloxacin     REACTION: burning of mouth and skin  . Elavil [Amitriptyline]     palpitation  . Lisinopril     REACTION: cough  . Penicillins     REACTION: rash  . Remeron [Mirtazapine]     constipation  .  Sulfonamide Derivatives     REACTION: malaise  . Tetanus Toxoid     REACTION: swelling  . Vesicare [Solifenacin Succinate] Other (See Comments)    Blurred vision and weakness  . Zoloft [Sertraline Hcl]     GI upset and nausea   Current Outpatient Prescriptions on File Prior to Visit  Medication Sig Dispense Refill  . amLODipine (NORVASC) 10 MG tablet Take 1 tablet (10 mg total) by mouth at bedtime. 90 tablet 3  . aspirin 81 MG tablet Take 81 mg by mouth daily.      . cetirizine (ZYRTEC) 10 MG tablet Take 10 mg by mouth daily as needed for allergies.     Marland Kitchen docusate sodium 100 MG CAPS Take 100 mg by mouth 2 (two) times daily. For constipation 60 capsule 3  . esomeprazole (NEXIUM) 40 MG capsule TAKE 1 CAPSULE (40 MG TOTAL) BY MOUTH DAILY. 90 capsule 1  . fluticasone (FLONASE) 50 MCG/ACT nasal spray Place 2 sprays into both nostrils daily. 48 g 3  . guaiFENesin (MUCINEX) 600 MG 12 hr tablet Take 600 mg by mouth 2 (two) times daily as needed for  cough or to loosen phlegm.     . metoprolol (LOPRESSOR) 50 MG tablet TAKE 1 TABLET BY MOUTH TWICE A DAY 180 tablet 1  . Multiple Vitamin (MULTIVITAMIN) capsule Take 1 capsule by mouth daily.      . potassium chloride SA (K-DUR,KLOR-CON) 20 MEQ tablet Take 1 tablet (20 mEq total) by mouth daily. 90 tablet 3  . zolpidem (AMBIEN) 10 MG tablet TAKE 1/2 TO 1 TABLET BY MOUTH AT BEDTIME AS NEEDED 30 tablet 3   No current facility-administered medications on file prior to visit.     Review of Systems Review of Systems  Constitutional: Negative for fever, appetite change, fatigue and unexpected weight change.  Eyes: Negative for pain and visual disturbance.  ENT pos for congestion and rhinorrhea  Respiratory: Negative for cough and shortness of breath.   Cardiovascular: Negative for cp or palpitations    Gastrointestinal: Negative for nausea, diarrhea and constipation.  Genitourinary: Negative for urgency and frequency.  Skin: Negative for pallor or rash    Neurological: Negative for weakness, light-headedness, numbness and headaches.  Hematological: Negative for adenopathy. Does not bruise/bleed easily.  Psychiatric/Behavioral: Negative for dysphoric mood. The patient is not nervous/anxious.         Objective:   Physical Exam  Constitutional: She appears well-developed and well-nourished. No distress.  obese and well appearing   HENT:  Head: Normocephalic and atraumatic.  Right Ear: External ear normal.  Left Ear: External ear normal.  Mouth/Throat: Oropharynx is clear and moist.  Eyes: Conjunctivae and EOM are normal. Pupils are equal, round, and reactive to light. No scleral icterus.  Neck: Normal range of motion. Neck supple. No JVD present. Carotid bruit is not present. No thyromegaly present.  Cardiovascular: Normal rate, regular rhythm, normal heart sounds and intact distal pulses.  Exam reveals no gallop.   Pulmonary/Chest: Effort normal and breath sounds normal. No respiratory distress. She has no wheezes. She exhibits no tenderness.  Abdominal: Soft. Bowel sounds are normal. She exhibits no distension, no abdominal bruit and no mass. There is no tenderness. There is no rebound.  No cva tenderness  Mild suprapubic tenderness  Genitourinary: No breast swelling, tenderness, discharge or bleeding.  Genitourinary Comments: Breast exam: No mass, nodules, thickening, tenderness, bulging, retraction, inflamation, nipple discharge or skin changes noted.  No axillary or clavicular LA.       Musculoskeletal: Normal range of motion. She exhibits no edema or tenderness.  Lymphadenopathy:    She has no cervical adenopathy.  Neurological: She is alert. She has normal reflexes. No cranial nerve deficit. She exhibits normal muscle tone. Coordination normal.  Skin: Skin is warm and dry. No rash noted. No erythema. No pallor.  Many SKs and skin tags on trunk and limbs diffusely  Psychiatric: She has a normal mood and affect.            Assessment & Plan:   Problem List Items Addressed This Visit      Cardiovascular and Mediastinum   Acute diastolic CHF (congestive heart failure) (HCC)    Pt states she has done well lately-no sob and wt is up 2 lb since sept       Relevant Medications   losartan (COZAAR) 100 MG tablet   furosemide (LASIX) 20 MG tablet   Essential hypertension - Primary    bp in fair control at this time  BP Readings from Last 1 Encounters:  06/16/16 130/70   No changes needed Disc lifstyle change with low sodium diet and  exercise  Labs reviewed       Relevant Medications   losartan (COZAAR) 100 MG tablet   furosemide (LASIX) 20 MG tablet     Endocrine   Hypothyroidism    Hypothyroidism  Pt has no clinical changes No change in energy level/ hair or skin/ edema and no tremor Lab Results  Component Value Date   TSH 1.35 06/09/2016     No c/o  Hx of thyroid nodules-no changes on exam       Relevant Medications   levothyroxine (SYNTHROID, LEVOTHROID) 25 MCG tablet   Left thyroid nodule    No change in exam or clinical picture  Has had bx in the past with Dr Cruzita Lederer      Relevant Medications   levothyroxine (SYNTHROID, LEVOTHROID) 25 MCG tablet     Other   Anemia    Suspect mild anemia of chronic dz in elderly female No symptoms  Continue to watch      Dysuria    In pt with past hx of frequent uti ua fairly bland  Sent for cx       Relevant Orders   POCT Urinalysis Dipstick (Automated) (Completed)   HYPERGLYCEMIA    Lab Results  Component Value Date   HGBA1C 5.9 06/09/2016   This is improved Enc to continue low glycemic diet       Hyperlipidemia   Relevant Medications   losartan (COZAAR) 100 MG tablet   furosemide (LASIX) 20 MG tablet   Obesity    .Discussed how this problem influences overall health and the risks it imposes  Reviewed plan for weight loss with lower calorie diet (via better food choices and also portion control or program like weight  watchers) and exercise building up to or more than 30 minutes 5 days per week including some aerobic activity   She is motivated to move more/use her exercise bike        Other Visit Diagnoses    Abnormal urinalysis       Relevant Orders   Urine culture

## 2016-06-16 NOTE — Assessment & Plan Note (Signed)
.  Discussed how this problem influences overall health and the risks it imposes  Reviewed plan for weight loss with lower calorie diet (via better food choices and also portion control or program like weight watchers) and exercise building up to or more than 30 minutes 5 days per week including some aerobic activity   She is motivated to move more/use her exercise bike

## 2016-06-16 NOTE — Assessment & Plan Note (Signed)
No change in exam or clinical picture  Has had bx in the past with Dr Cruzita Lederer

## 2016-06-16 NOTE — Assessment & Plan Note (Signed)
Lab Results  Component Value Date   HGBA1C 5.9 06/09/2016   This is improved Enc to continue low glycemic diet

## 2016-06-16 NOTE — Patient Instructions (Addendum)
Slowly work up to 20-30 minutes on exercise bike  Try to increase your time by 2-3 minutes per week  Continue socializing for your brain / also read more   If you are interested in a shingles/zoster vaccine - call your insurance to check on coverage,( you should not get it within 1 month of other vaccines) , then call us for a prescription  for it to take to a pharmacy that gives the shot , or make a nurse visit to get it here depending on your coverage   Leave a urine sample on the [place] out- we will call you about results

## 2016-06-16 NOTE — Progress Notes (Signed)
Pre visit review using our clinic review tool, if applicable. No additional management support is needed unless otherwise documented below in the visit note. 

## 2016-06-16 NOTE — Assessment & Plan Note (Signed)
In pt with past hx of frequent uti ua fairly bland  Sent for cx

## 2016-06-16 NOTE — Assessment & Plan Note (Signed)
Hypothyroidism  Pt has no clinical changes No change in energy level/ hair or skin/ edema and no tremor Lab Results  Component Value Date   TSH 1.35 06/09/2016     No c/o  Hx of thyroid nodules-no changes on exam

## 2016-06-18 LAB — URINE CULTURE: Organism ID, Bacteria: NO GROWTH

## 2016-06-19 ENCOUNTER — Encounter: Payer: Self-pay | Admitting: Cardiovascular Disease

## 2016-06-19 ENCOUNTER — Ambulatory Visit (INDEPENDENT_AMBULATORY_CARE_PROVIDER_SITE_OTHER): Payer: Medicare Other | Admitting: Cardiovascular Disease

## 2016-06-19 VITALS — BP 146/72 | HR 67 | Ht 63.5 in | Wt 185.5 lb

## 2016-06-19 DIAGNOSIS — I454 Nonspecific intraventricular block: Secondary | ICD-10-CM

## 2016-06-19 DIAGNOSIS — R600 Localized edema: Secondary | ICD-10-CM

## 2016-06-19 DIAGNOSIS — I209 Angina pectoris, unspecified: Secondary | ICD-10-CM

## 2016-06-19 DIAGNOSIS — I25119 Atherosclerotic heart disease of native coronary artery with unspecified angina pectoris: Secondary | ICD-10-CM | POA: Diagnosis not present

## 2016-06-19 DIAGNOSIS — I6523 Occlusion and stenosis of bilateral carotid arteries: Secondary | ICD-10-CM | POA: Diagnosis not present

## 2016-06-19 DIAGNOSIS — I5031 Acute diastolic (congestive) heart failure: Secondary | ICD-10-CM

## 2016-06-19 DIAGNOSIS — I10 Essential (primary) hypertension: Secondary | ICD-10-CM

## 2016-06-19 MED ORDER — FUROSEMIDE 20 MG PO TABS: 20.0000 mg | ORAL_TABLET | Freq: Two times a day (BID) | ORAL | 3 refills | Status: DC | PRN

## 2016-06-19 NOTE — Progress Notes (Signed)
Cardiology Office Note  Date:  06/19/2016   ID:  [name], DOB 07/15/1928, MRN [id number]  PCP:  Loura Pardon, MD   Chief Complaint  Patient presents with  . other    12 month fu. Pt states she is doing well. Reviewed meds with pt verbally.    HPI:  Kimberly [name] is a pleasant 82 year-old woman with prior history of chest pain, cardiac catheterization in 2001 and 2006 with no significant CAD,negative stress test in may 2011, with recent admission to [facility] July 1 with discharge [id number] with diastolic CHF, acute. She presents for routine followup of her chest pain and diastolic CHF  In follow-up today, she reports that she has had significant stress Son with cancer, metastases She has been sitting by his bedside, feet down She has developed worsening lower extremity swelling Also reports having some bladder irritation  Denies any significant shortness of breath on exertion but has been sedentary  Previously stopped the HCTZ for low sodium.  Weight is stable, Baseline weight typically 182 up to 184 pounds Today weight is 185.8 pounds  EKG on today's visit shows normal sinus rhythm with rate 67 bpm, left bundle branch block  Other past medical history  01/31/2014 with tightness in her chest, shortness of breath.  diagnosed with diastolic CHF, She had significant diuresis in the hospital. BNP 1900, cardiac enzymes negative for MI Echocardiogram showed normal LV systolic function, diastolic dysfunction noted  She was discharged on Lasix 20 mg daily. Weight when she left the hospital was 178 to 180 pounds She denies having any significant lower extremity edema She states that she's not sleeping well. Fatigue since she left the hospital  Previous basic metabolic panel in July [id number] showing sodium 127, low potassium    PMH:   has a past medical history of Allergy; CAD (coronary artery disease); CHF (congestive heart failure) ([place]); GERD (gastroesophageal reflux disease);  Hyperlipidemia; Hypertension; Hypothyroid; and Insomnia.  PSH:    Past Surgical History:  Procedure Laterality Date  . CARDIAC CATHETERIZATION     [place]    . THYROID SURGERY      Current Outpatient Prescriptions  Medication Sig Dispense Refill  . amLODipine (NORVASC) 10 MG tablet Take 1 tablet (10 mg total) by mouth at bedtime. 90 tablet 3  . aspirin 81 MG tablet Take 81 mg by mouth daily.      . cetirizine (ZYRTEC) 10 MG tablet Take 10 mg by mouth daily as needed for allergies.     Marland Kitchen docusate sodium 100 MG CAPS Take 100 mg by mouth 2 (two) times daily. For constipation 60 capsule 3  . esomeprazole (NEXIUM) 40 MG capsule TAKE 1 CAPSULE (40 MG TOTAL) BY MOUTH DAILY. 90 capsule 1  . fluticasone (FLONASE) 50 MCG/ACT nasal spray Place 2 sprays into both nostrils daily. 48 g 3  . furosemide (LASIX) 20 MG tablet Take 1 tablet (20 mg total) by mouth 2 (two) times daily as needed. 180 tablet 3  . guaiFENesin (MUCINEX) 600 MG 12 hr tablet Take 600 mg by mouth 2 (two) times daily as needed for cough or to loosen phlegm.     Marland Kitchen levothyroxine (SYNTHROID, LEVOTHROID) 25 MCG tablet Take 1 tablet (25 mcg total) by mouth daily. 90 tablet 3  . losartan (COZAAR) 100 MG tablet TAKE 1 TABLET (100 MG TOTAL) BY MOUTH DAILY. 90 tablet 3  . metoprolol (LOPRESSOR) 50 MG tablet  TAKE 1 TABLET BY MOUTH TWICE A DAY 180 tablet 1  . Multiple Vitamin (MULTIVITAMIN) capsule Take 1 capsule by mouth daily.      . potassium chloride SA (K-DUR,KLOR-CON) 20 MEQ tablet Take 1 tablet (20 mEq total) by mouth daily. 90 tablet 3  . zolpidem (AMBIEN) 10 MG tablet TAKE 1/2 TO 1 TABLET BY MOUTH AT BEDTIME AS NEEDED 30 tablet 3   No current facility-administered medications for this visit.      Allergies:   Cephalexin; Ciprofloxacin; Elavil [amitriptyline]; Lisinopril; Penicillins; Remeron [mirtazapine]; Sulfonamide derivatives; Tetanus toxoid; Vesicare [solifenacin succinate]; and  Zoloft [sertraline hcl]   Social History:  The patient  reports that she has never smoked. She has never used smokeless tobacco. She reports that she does not drink alcohol or use drugs.   Family History:   family history includes Heart disease in her mother; Hypertension in her mother.    Review of Systems: Review of Systems  Constitutional: Negative.   Respiratory: Negative.   Cardiovascular: Positive for leg swelling.  Gastrointestinal: Negative.   Musculoskeletal: Negative.   Neurological: Negative.   Psychiatric/Behavioral: Negative.   All other systems reviewed and are negative.    PHYSICAL EXAM: VS:  BP (!) 146/72 (BP Location: Left Arm, Patient Position: Sitting, Cuff Size: Normal)   Pulse 67   Ht 5' 3.5" (1.613 m)   Wt 185 lb 8 oz (84.1 kg)   LMP 08/03/1978   BMI 32.34 kg/m  , BMI Body mass index is 32.34 kg/m. GEN: Well nourished, well developed, in no acute distress, obese  HEENT: normal  Neck: no JVD, carotid bruits, or masses Cardiac: RRR; no murmurs, rubs, or gallops, nonpitting tender lower extremity edema Respiratory:  clear to auscultation bilaterally, normal work of breathing GI: soft, nontender, nondistended, + BS MS: no deformity or atrophy  Skin: warm and dry, no rash Neuro:  Strength and sensation are intact Psych: euthymic mood, full affect    Recent Labs: 06/09/2016: ALT 10; BUN 12; Creatinine, Ser 1.08; Hemoglobin 11.1; Platelets 318.0; Potassium 4.6; Sodium 133; TSH 1.35    Lipid Panel Lab Results  Component Value Date   CHOL 190 06/09/2016   HDL 34.20 (L) 06/09/2016   TRIG (H) 06/09/2016    404.0 Triglyceride is over 400; calculations on Lipids are invalid.      Wt Readings from Last 3 Encounters:  06/19/16 185 lb 8 oz (84.1 kg)  06/16/16 184 lb 8 oz (83.7 kg)  06/09/16 185 lb (83.9 kg)       ASSESSMENT AND PLAN:  Essential hypertension - Plan: EKG 12-Lead Blood pressure is mildly elevated. This may improve with extra doses  of Lasix  Atherosclerosis of native coronary artery of native heart with angina pectoris ([place]) - Plan: EKG 12-Lead No significant coronary disease by previous cardiac catheterizations No symptoms concerning for angina  Bundle branch block - Plan: EKG [id number]  Acute diastolic heart failure ([place]) - Plan: EKG 12-Lead As below, will try extra Lasix after lunch for leg swelling  Atherosclerosis of both carotid arteries - Plan: EKG 12-Lead Minimal stenosis noted October 2017  Lower extremity edema Leg swelling likely secondary to sitting for prolonged periods of time, taking care of her son who has cancer. Unable to exclude component of diastolic CHF Recommended she take extra Lasix after lunch for several days in a row then as needed. Currently takes Lasix daily 20 mg. Also recommended leg elevation, compression hose for dependent edema If symptoms get worse recommended she  contact our office   Total encounter time more than 25 minutes  Greater than 50% was spent in counseling and coordination of care with the patient   Disposition:   F/U  12 months   Orders Placed This Encounter  Procedures  . EKG 12-Lead     Signed, Esmond Plants, M.D., Ph.D. 06/19/2016  [place], [place]

## 2016-06-19 NOTE — Patient Instructions (Addendum)
Medication Instructions:   Take extra lasix in the afternoon, 2 pm as needed for leg swelling  Try compresson hose, Leg elevation, Get up to stretch legs more  Watch the salt and fluids  Labwork:  No new labs needed  Testing/Procedures:  No further testing at this time   I recommend watching educational videos on topics of interest to you at:       [email or web address].[email or web address]  Enter code: HEARTCARE    Follow-Up: It was a pleasure seeing you in the office today. Please call us if you have new issues that need to be addressed before your next appt.  [phone]  Your physician wants you to follow-up in: 12 months.  You will receive a reminder letter in the mail two months in advance. If you don't receive a letter, please call our office to schedule the follow-up appointment.  If you need a refill on your cardiac medications before your next appointment, please call your pharmacy.

## 2016-07-07 ENCOUNTER — Other Ambulatory Visit: Payer: Self-pay | Admitting: Family Medicine

## 2016-07-10 ENCOUNTER — Other Ambulatory Visit: Payer: Self-pay

## 2016-08-20 ENCOUNTER — Other Ambulatory Visit: Payer: Self-pay | Admitting: Family Medicine

## 2016-08-21 NOTE — Telephone Encounter (Signed)
Last filled on 04/26/16 #30 tabs with 3 additional refills, CPE done on 06/16/16, please advise

## 2016-08-21 NOTE — Telephone Encounter (Signed)
Rx called in as prescribed 

## 2016-08-21 NOTE — Telephone Encounter (Signed)
Px written for call in   

## 2016-08-26 ENCOUNTER — Ambulatory Visit (INDEPENDENT_AMBULATORY_CARE_PROVIDER_SITE_OTHER): Payer: Medicare Other | Admitting: Primary Care

## 2016-08-26 ENCOUNTER — Encounter: Payer: Self-pay | Admitting: Primary Care

## 2016-08-26 VITALS — BP 136/82 | HR 67 | Temp 97.6°F | Ht 62.25 in | Wt 191.8 lb

## 2016-08-26 DIAGNOSIS — J209 Acute bronchitis, unspecified: Secondary | ICD-10-CM

## 2016-08-26 MED ORDER — DOXYCYCLINE HYCLATE 100 MG PO TABS: 100.0000 mg | ORAL_TABLET | Freq: Two times a day (BID) | ORAL | 0 refills | Status: DC

## 2016-08-26 NOTE — Patient Instructions (Signed)
Start Doxycycline antibiotic. Take 1 tablet by mouth twice daily for 10 days.  Continue Mucinex and Robitussin for cough and congestion.  Ensure you are staying hydrated with water and rest.  It was a pleasure to see you today!

## 2016-08-26 NOTE — Progress Notes (Signed)
Subjective:    Patient ID: Kimberly [name], female    DOB: 1927-08-10, 83 y.o.   MRN: [id number]  HPI  Kimberly [name] is an 83 year old female who presents today with a chief complaint of cough. She also reports shortness of breath, nasal congestion, chills, fever, ankle edema. Her symptoms began 10 days ago. Her cough is productive. She's taken Mucinex and Robitussin with mild improvement. She denies abdominal pain, nausea. Overall she's feeling worse.  Review of Systems  Constitutional: Positive for chills, fatigue and fever.  HENT: Positive for congestion and sore throat. Negative for ear pain and sinus pressure.   Respiratory: Positive for shortness of breath. Negative for wheezing.   Cardiovascular: Negative for chest pain.       Past Medical History:  Diagnosis Date  . Allergy    allergic rhinitis  . CAD (coronary artery disease)   . CHF (congestive heart failure) ([place])   . GERD (gastroesophageal reflux disease)   . Hyperlipidemia   . Hypertension   . Hypothyroid   . Insomnia      Social History   Social History  . Marital status: Widowed    Spouse name: N/A  . Number of children: N/A  . Years of education: N/A   Occupational History  . Not on file.   Social History Main Topics  . Smoking status: Never Smoker  . Smokeless tobacco: Never Used  . Alcohol use No  . Drug use: No  . Sexual activity: No   Other Topics Concern  . Not on file   Social History Narrative  . No narrative on file    Past Surgical History:  Procedure Laterality Date  . CARDIAC CATHETERIZATION     [place]    . THYROID SURGERY      Family History  Problem Relation Age of Onset  . Heart disease Mother   . Hypertension Mother     Allergies  Allergen Reactions  . Cephalexin     REACTION: rash and burning sensation to skin on arms and legs with itching.  . Ciprofloxacin     REACTION: burning of mouth and skin  . Elavil [Amitriptyline]       palpitation  . Lisinopril     REACTION: cough  . Penicillins     REACTION: rash  . Remeron [Mirtazapine]     constipation  . Sulfonamide Derivatives     REACTION: malaise  . Tetanus Toxoid     REACTION: swelling  . Vesicare [Solifenacin Succinate] Other (See Comments)    Blurred vision and weakness  . Zoloft [Sertraline Hcl]     GI upset and nausea    Current Outpatient Prescriptions on File Prior to Visit  Medication Sig Dispense Refill  . amLODipine (NORVASC) 10 MG tablet Take 1 tablet (10 mg total) by mouth at bedtime. 90 tablet 3  . aspirin 81 MG tablet Take 81 mg by mouth daily.      . cetirizine (ZYRTEC) 10 MG tablet Take 10 mg by mouth daily as needed for allergies.     Marland Kitchen docusate sodium 100 MG CAPS Take 100 mg by mouth 2 (two) times daily. For constipation 60 capsule 3  . esomeprazole (NEXIUM) 40 MG capsule TAKE 1 CAPSULE (40 MG TOTAL) BY MOUTH DAILY. 90 capsule 1  . fluticasone (FLONASE) 50 MCG/ACT nasal spray Place 2 sprays into both nostrils daily. 48 g 3  . furosemide (  LASIX) 20 MG tablet Take 1 tablet (20 mg total) by mouth 2 (two) times daily as needed. 180 tablet 3  . guaiFENesin (MUCINEX) 600 MG 12 hr tablet Take 600 mg by mouth 2 (two) times daily as needed for cough or to loosen phlegm.     Marland Kitchen levothyroxine (SYNTHROID, LEVOTHROID) 25 MCG tablet Take 1 tablet (25 mcg total) by mouth daily. 90 tablet 3  . losartan (COZAAR) 100 MG tablet TAKE 1 TABLET (100 MG TOTAL) BY MOUTH DAILY. 90 tablet 3  . metoprolol (LOPRESSOR) 50 MG tablet TAKE 1 TABLET BY MOUTH TWICE A DAY 180 tablet 1  . Multiple Vitamin (MULTIVITAMIN) capsule Take 1 capsule by mouth daily.      . potassium chloride SA (K-DUR,KLOR-CON) 20 MEQ tablet Take 1 tablet (20 mEq total) by mouth daily. 90 tablet 3  . zolpidem (AMBIEN) 10 MG tablet TAKE 1/2-1 TABLET BY MOUTH AT BEDTIME AS NEEDED 30 tablet 1   No current facility-administered medications on file prior to visit.     BP 136/82   Pulse 67   Temp  97.6 F (36.4 C) (Oral)   Ht 5' 2.25" (1.581 m)   Wt 191 lb 12.8 oz (87 kg)   LMP 08/03/1978   SpO2 95%   BMI 34.80 kg/m    Objective:   Physical Exam  Constitutional: She appears well-nourished. She appears ill.  HENT:  Right Ear: Tympanic membrane and ear canal normal.  Left Ear: Tympanic membrane and ear canal normal.  Nose: Right sinus exhibits no maxillary sinus tenderness and no frontal sinus tenderness. Left sinus exhibits no maxillary sinus tenderness and no frontal sinus tenderness.  Mouth/Throat: Posterior oropharyngeal erythema present. No oropharyngeal exudate or posterior oropharyngeal edema.  Eyes: Conjunctivae are normal.  Neck: Neck supple.  Cardiovascular: Normal rate and regular rhythm.   Pulmonary/Chest: Effort normal. She has no decreased breath sounds. She has no wheezes. She has rhonchi in the right upper field, the right lower field, the left upper field and the left lower field. She has no rales.  Lymphadenopathy:    She has no cervical adenopathy.  Skin: Skin is warm and dry.          Assessment & Plan:  Acute Bronchitis:  Cough, congestion, fatigue, fevers x 10+ days. Little improvement with OTC treatment. Exam today with rhonchi throughout, does appear acutely ill, vitals stable. Given duration of symptoms, presentation, and lack of improvement will treat. Rx for Doxycycline course sent to pharmacy. Continue Mucinex and Robitussin. Fluids, rest follow up PRN.  Sheral Flow, NP

## 2016-08-26 NOTE — Progress Notes (Signed)
Pre visit review using our clinic review tool, if applicable. No additional management support is needed unless otherwise documented below in the visit note. 

## 2016-10-12 ENCOUNTER — Other Ambulatory Visit: Payer: Self-pay | Admitting: Family Medicine

## 2016-10-17 ENCOUNTER — Other Ambulatory Visit: Payer: Self-pay | Admitting: Family Medicine

## 2016-10-20 ENCOUNTER — Other Ambulatory Visit: Payer: Self-pay | Admitting: Family Medicine

## 2016-10-20 NOTE — Telephone Encounter (Signed)
Rx called in as prescribed 

## 2016-10-20 NOTE — Telephone Encounter (Signed)
Px written for call in   

## 2016-10-20 NOTE — Telephone Encounter (Signed)
Pt had CPE on 06/16/16, last filled on 08/21/16 #30 tabs with 1 additional refill, please advise

## 2016-11-29 ENCOUNTER — Emergency Department: Payer: Medicare Other

## 2016-11-29 ENCOUNTER — Emergency Department
Admission: EM | Admit: 2016-11-29 | Discharge: 2016-11-29 | Disposition: A | Discharge status: Home / Self Care | Payer: Medicare Other | Attending: Emergency Medicine | Admitting: Emergency Medicine

## 2016-11-29 DIAGNOSIS — I5031 Acute diastolic (congestive) heart failure: Secondary | ICD-10-CM | POA: Diagnosis not present

## 2016-11-29 DIAGNOSIS — W06XXXA Fall from bed, initial encounter: Secondary | ICD-10-CM | POA: Diagnosis not present

## 2016-11-29 DIAGNOSIS — I110 Hypertensive heart disease with heart failure: Secondary | ICD-10-CM | POA: Diagnosis not present

## 2016-11-29 DIAGNOSIS — M545 Low back pain, unspecified: Secondary | ICD-10-CM

## 2016-11-29 DIAGNOSIS — Z7982 Long term (current) use of aspirin: Secondary | ICD-10-CM | POA: Insufficient documentation

## 2016-11-29 DIAGNOSIS — E039 Hypothyroidism, unspecified: Secondary | ICD-10-CM | POA: Insufficient documentation

## 2016-11-29 DIAGNOSIS — Y999 Unspecified external cause status: Secondary | ICD-10-CM | POA: Diagnosis not present

## 2016-11-29 DIAGNOSIS — Y939 Activity, unspecified: Secondary | ICD-10-CM | POA: Insufficient documentation

## 2016-11-29 DIAGNOSIS — I2510 Atherosclerotic heart disease of native coronary artery without angina pectoris: Secondary | ICD-10-CM | POA: Insufficient documentation

## 2016-11-29 DIAGNOSIS — Z79899 Other long term (current) drug therapy: Secondary | ICD-10-CM | POA: Diagnosis not present

## 2016-11-29 DIAGNOSIS — W19XXXA Unspecified fall, initial encounter: Secondary | ICD-10-CM

## 2016-11-29 DIAGNOSIS — S3992XA Unspecified injury of lower back, initial encounter: Secondary | ICD-10-CM | POA: Diagnosis present

## 2016-11-29 DIAGNOSIS — Y929 Unspecified place or not applicable: Secondary | ICD-10-CM | POA: Diagnosis not present

## 2016-11-29 MED ORDER — IBUPROFEN 400 MG PO TABS
400.0000 mg | ORAL_TABLET | Freq: Once | ORAL | Status: AC
  Administered 2016-11-29: 400 mg via ORAL
  Filled 2016-11-29: qty 1

## 2016-11-29 MED ORDER — ONDANSETRON 4 MG PO TBDP
ORAL_TABLET | ORAL | Status: AC
  Administered 2016-11-29: 4 mg
  Filled 2016-11-29: qty 1

## 2016-11-29 NOTE — ED Triage Notes (Signed)
Reports got up to go to bathroom and slipped and fell.  Patient reports pain to lower back.

## 2016-11-29 NOTE — ED Notes (Signed)
Heading back to treatment room when pt voiced need to void; stopped at bathroom in triage area; will take to treatment room when finished

## 2016-11-29 NOTE — ED Notes (Signed)
Pt reports to ED w/ c/o back pain after fall.  Pt sts that she remembers fall, sts that her feet slipped out from under her.  Denies LOC, urinary s/s, head injury,  nausea at this time. Reports 1 episode of emesis that pt attributes to pain.  Pt A/OX4, resp even and unlabored. Denies CP, SOB. NAD.

## 2016-11-29 NOTE — ED Notes (Signed)
Pt verbalized understanding of discharge instructions. NAD at this time. 

## 2016-11-29 NOTE — ED Notes (Signed)
Report given to Kim RN.

## 2016-11-29 NOTE — ED Provider Notes (Signed)
[facility] Emergency Department Provider Note  ____________________________________________   First MD Initiated Contact with Patient 11/29/16 [phone]     (approximate)  I have reviewed the triage vital signs and the nursing notes.   HISTORY  Chief Complaint Fall and Back Pain   HPI Kimberly [name] is a 83 y.o. female with a history of hypertension who is presenting to the emergency department today with low back pain after a fall.  The patient says that she lost her footing this morning and fell onto her behind. However, she is not having any pain to her behind. She reports the pain to her low lumbar region which is constant and moderate to severe especially with movement. She says it feels like something is "jammed." She denies any radiation of the pain to her lower extremities. Denies hitting her head or losing consciousness. Says that she usually does not take pain medication but when she doesn't usually aspirin or ibuprofen.   Past Medical History:  Diagnosis Date  . Allergy    allergic rhinitis  . CAD (coronary artery disease)   . CHF (congestive heart failure) ([place])   . GERD (gastroesophageal reflux disease)   . Hyperlipidemia   . Hypertension   . Hypothyroid   . Insomnia     Patient Active Problem List   Diagnosis Date Noted  . Lower extremity edema 06/19/2016  . Carotid atherosclerosis 04/29/2016  . Medicare annual wellness visit, initial 05/27/2015  . Routine general medical examination at a health care facility 05/27/2015  . Colon cancer screening 05/27/2015  . Hyposmolality and/or hyponatremia 05/28/2014  . Hypokalemia 05/28/2014  . Acute diastolic heart failure ([place]) 01/31/2014  . Hyponatremia 01/31/2014  . Unspecified constipation 01/31/2014  . Acute diastolic CHF (congestive heart failure) ([place]) 01/31/2014  . Left thyroid nodule 08/04/2012  . Obesity 12/04/2011  . Gastritis 12/04/2011  . Heart palpitations 09/01/2011  . Depression  05/26/2011  . Frequent UTI 12/30/2010  . Lower abdominal pain 11/27/2010  . Dysuria 11/26/2010  . COLONIC POLYPS 04/09/2010  . Anemia 04/09/2010  . HEMORRHOIDS 03/04/2010  . ANXIETY 12/10/2009  . PERSONAL HX COLONIC POLYPS 06/19/2008  . IRRITABLE BOWEL SYNDROME 03/28/2008  . 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 GOITER 03/29/2007  . Hypothyroidism 03/29/2007  . Hyperlipidemia 03/29/2007  . Essential hypertension 03/29/2007  . Coronary atherosclerosis 03/29/2007  . Bundle branch block 03/29/2007  . ALLERGIC RHINITIS 03/29/2007  . GERD 03/29/2007  . HYPERGLYCEMIA 03/29/2007    Past Surgical History:  Procedure Laterality Date  . CARDIAC CATHETERIZATION     [place]    . THYROID SURGERY      Prior to Admission medications   Medication Sig Start Date End Date Taking? Authorizing Provider  amLODipine (NORVASC) 10 MG tablet Take 1 tablet (10 mg total) by mouth at bedtime. 02/05/16   Minna Merritts, MD  aspirin 81 MG tablet Take 81 mg by mouth daily.      Historical Provider, MD  cetirizine (ZYRTEC) 10 MG tablet Take 10 mg by mouth daily as needed for allergies.     Historical Provider, MD  docusate sodium 100 MG CAPS Take 100 mg by mouth 2 (two) times daily. For constipation 02/02/14   Ripudeep Krystal Eaton, MD  doxycycline (VIBRA-TABS) 100 MG tablet Take 1 tablet (100 mg total) by mouth 2 (two) times daily. 08/26/16   Pleas Koch, NP  esomeprazole (NEXIUM) 40 MG  capsule TAKE 1 CAPSULE (40 MG TOTAL) BY MOUTH DAILY. 10/12/16   Abner Greenspan, MD  fluticasone (FLONASE) 50 MCG/ACT nasal spray Place 2 sprays into both nostrils daily. 10/13/13   Abner Greenspan, MD  furosemide (LASIX) 20 MG tablet Take 1 tablet (20 mg total) by mouth 2 (two) times daily as needed. 06/19/16   Minna Merritts, MD  guaiFENesin (MUCINEX) 600 MG 12 hr tablet Take 600 mg by mouth 2 (two) times daily as needed for cough or to loosen phlegm.     Historical Provider,  MD  KLOR-CON M20 20 MEQ tablet TAKE 1 TABLET (20 MEQ TOTAL) BY MOUTH DAILY. 10/19/16   Abner Greenspan, MD  levothyroxine (SYNTHROID, LEVOTHROID) 25 MCG tablet Take 1 tablet (25 mcg total) by mouth daily. 06/16/16   Abner Greenspan, MD  losartan (COZAAR) 100 MG tablet TAKE 1 TABLET (100 MG TOTAL) BY MOUTH DAILY. 06/16/16   Abner Greenspan, MD  metoprolol (LOPRESSOR) 50 MG tablet TAKE 1 TABLET BY MOUTH TWICE A DAY 06/08/16   Abner Greenspan, MD  Multiple Vitamin (MULTIVITAMIN) capsule Take 1 capsule by mouth daily.      Historical Provider, MD  zolpidem (AMBIEN) 10 MG tablet TAKE 1/2-1 TABLET BY MOUTH AT BEDTIME AS NEEDED 10/20/16   Abner Greenspan, MD    Allergies Cephalexin; Ciprofloxacin; Elavil [amitriptyline]; Lisinopril; Penicillins; Remeron [mirtazapine]; Sulfonamide derivatives; Tetanus toxoid; Vesicare [solifenacin succinate]; and Zoloft [sertraline hcl]  Family History  Problem Relation Age of Onset  . Heart disease Mother   . Hypertension Mother     Social History Social History  Substance Use Topics  . Smoking status: Never Smoker  . Smokeless tobacco: Never Used  . Alcohol use No    Review of Systems  Constitutional: No fever/chills Eyes: No visual changes. ENT: No sore throat. Cardiovascular: Denies chest pain. Respiratory: Denies shortness of breath. Gastrointestinal: No abdominal pain.  No nausea, no vomiting.  No diarrhea.  No constipation. Genitourinary: Negative for dysuria. Musculoskeletal: as above Skin: Negative for rash. Neurological: Negative for headaches, focal weakness or numbness.   ____________________________________________   PHYSICAL EXAM:  VITAL SIGNS: ED Triage Vitals  Enc Vitals Group     BP 11/29/16 0525 (!) 157/52     Pulse Rate 11/29/16 0525 69     Resp 11/29/16 0525 20     Temp 11/29/16 0525 97.7 F (36.5 C)     Temp Source 11/29/16 0525 Oral     SpO2 11/29/16 0525 95 %     Weight 11/29/16 0525 180 lb (81.6 kg)     Height 11/29/16 0525  5\' 3"  (1.6 m)     Head Circumference --      Peak Flow --      Pain Score 11/29/16 0523 5     Pain Loc --      Pain Edu? --      Excl. in [place]? --     Constitutional: Alert and oriented.  in no acute distress. Eyes: Conjunctivae are normal. PERRL. EOMI. Head: Atraumatic. Nose: No congestion/rhinnorhea. Mouth/Throat: Mucous membranes are moist.   Neck: No stridor.   Cardiovascular: Normal rate, regular rhythm. Grossly normal heart sounds.   Respiratory: Normal respiratory effort.  No retractions. Lungs CTAB. Gastrointestinal: Soft and nontender. No distention. No abdominal bruits. No CVA tenderness. Musculoskeletal: No lower extremity tenderness nor edema.  No joint effusions.Ranges the hips with 5 out of 5 strength, bilaterally.  Tender to palpation to the L4-L5 area at  the midline without any deformity or step-off. The tenderness is mild to moderate.  Neurologic:  Normal speech and language. No gross focal neurologic deficits are appreciated. Skin:  Skin is warm, dry and intact. No rash noted. Psychiatric: Mood and affect are normal. Speech and behavior are normal.  ____________________________________________   LABS (all labs ordered are listed, but only abnormal results are displayed)  Labs Reviewed - No data to display ____________________________________________  EKG   ____________________________________________  RADIOLOGY  DG Lumbar Spine Complete (Final result)  Result time 11/29/16 07:59:22  Final result by Earle Gell, MD (11/29/16 07:59:22)           Narrative:   CLINICAL DATA: Fall out of bed this morning. Low back pain. Initial encounter.  EXAM: LUMBAR SPINE - COMPLETE 4+ VIEW  COMPARISON: None  FINDINGS: There is no evidence of lumbar spine fracture. Alignment is normal. Mild to moderate degenerative disc disease noted from levels of T11-L4.  There is a mild wedge compression deformity of the T11 vertebral body, which is of indeterminate age.  Generalized osteopenia noted. No focal lytic or sclerotic bone lesions identified. Aortic atherosclerosis and small calcified uterine fibroids again noted.  IMPRESSION: No acute lumbar spine findings. Degenerative spondylosis, as described above.  Mild wedge compression deformity of T11 vertebral body, which is of indeterminate age. Recommend clinical correlation and consider dedicated thoracic spine radiographs centered at this level.   Electronically Signed By: Earle Gell M.D. On: 11/29/2016 07:59          ____________________________________________   PROCEDURES  Procedure(s) performed:   Procedures  Critical Care performed:   ____________________________________________   INITIAL IMPRESSION / ASSESSMENT AND PLAN / ED COURSE  Pertinent labs & imaging results that were available during my care of the patient were reviewed by me and considered in my medical decision making (see chart for details).  ----------------------------------------- 8:39 AM on 11/29/2016 -----------------------------------------  Patient says that her pain is improved after ibuprofen. Unclear significance of the T11 wedge shaped vertebral body. However, I did discuss this with the patient as well as her daughter who is at the bedside. The patient has been able to ambulate this morning. Recommended continue oral pain medications as well as topical therapy such as ice, icy hot or Aspercreme. The patient will be following up with her primary care doctor. Will be discharged home. She is understanding of the plan as well as the diagnoses and willing to comply.      ____________________________________________   FINAL CLINICAL IMPRESSION(S) / ED DIAGNOSES  Fall. Low back pain.    NEW MEDICATIONS STARTED DURING THIS VISIT:  New Prescriptions   No medications on file     Note:  This document was prepared using Dragon voice recognition software and may include unintentional  dictation errors.    Orbie Pyo, MD 11/29/16 [phone]

## 2016-12-02 ENCOUNTER — Other Ambulatory Visit: Payer: Self-pay | Admitting: Family Medicine

## 2016-12-07 ENCOUNTER — Ambulatory Visit (INDEPENDENT_AMBULATORY_CARE_PROVIDER_SITE_OTHER): Payer: Medicare Other | Admitting: Family Medicine

## 2016-12-07 ENCOUNTER — Encounter: Payer: Self-pay | Admitting: Family Medicine

## 2016-12-07 VITALS — BP 140/68 | HR 63 | Temp 98.1°F | Ht 62.25 in | Wt 183.5 lb

## 2016-12-07 DIAGNOSIS — E2839 Other primary ovarian failure: Secondary | ICD-10-CM

## 2016-12-07 DIAGNOSIS — I10 Essential (primary) hypertension: Secondary | ICD-10-CM

## 2016-12-07 DIAGNOSIS — M545 Low back pain, unspecified: Secondary | ICD-10-CM

## 2016-12-07 DIAGNOSIS — I5031 Acute diastolic (congestive) heart failure: Secondary | ICD-10-CM

## 2016-12-07 DIAGNOSIS — R350 Frequency of micturition: Secondary | ICD-10-CM | POA: Diagnosis not present

## 2016-12-07 DIAGNOSIS — S22000A Wedge compression fracture of unspecified thoracic vertebra, initial encounter for closed fracture: Secondary | ICD-10-CM

## 2016-12-07 DIAGNOSIS — W19XXXA Unspecified fall, initial encounter: Secondary | ICD-10-CM | POA: Insufficient documentation

## 2016-12-07 LAB — POC URINALSYSI DIPSTICK (AUTOMATED)
Bilirubin, UA: NEGATIVE
GLUCOSE UA: NEGATIVE
Ketones, UA: NEGATIVE
Leukocytes, UA: NEGATIVE
Nitrite, UA: NEGATIVE
PH UA: 6 (ref 5.0–8.0)
Protein, UA: 30
RBC UA: NEGATIVE
SPEC GRAV UA: 1.015 (ref 1.010–1.025)
Urobilinogen, UA: 0.2 E.U./dL

## 2016-12-07 NOTE — Patient Instructions (Addendum)
Get up slowly  Here is a handout regarding fall prevention  Glad your back is getting better  Aside from a small coffee- drink nothing else but water We will culture your urine and advise you when it returns   Try to get 1200-1500 mg of calcium per day with at least 1000 iu of vitamin D - for bone health   We will schedule a bone density test on the [place] out

## 2016-12-07 NOTE — Assessment & Plan Note (Signed)
bp was elevated in ED -due to pain and anxiety  Improved today  bp in fair control at this time  BP Readings from Last 1 Encounters:  12/07/16 140/68   No changes needed Disc lifstyle change with low sodium diet and exercise

## 2016-12-07 NOTE — Assessment & Plan Note (Signed)
After a fall  Reviewed hospital records, lab results and studies in detail  -from ED visit  Reassuring exam today  rec heat/ice and walking  Can stop nsaid  Update if not continuing  to improve in a week or if worsening

## 2016-12-07 NOTE — Progress Notes (Signed)
Subjective:    Patient ID: Kimberly [name], female    DOB: [birth year], 83 y.o.   MRN: [id number]  HPI  Here for f/u of ED visit on 4/29 and also uti symptoms  She presented to ED with low back pain after a fall (lost her footing and fell on her behind)  She got up to go to the bathroom in the middle of the night (her bed is a bit high) Pain developed in low LS area -worse with movement No head injury   bp was elevated 157/52, then 170/64 Improved today  BP Readings from Last 3 Encounters:  12/07/16 140/68  11/29/16 (!) 170/64  08/26/16 136/82    Wt Readings from Last 3 Encounters:  12/07/16 183 lb 8 oz (83.2 kg)  11/29/16 180 lb (81.6 kg)  08/26/16 191 lb 12.8 oz (87 kg)     LS films Dg Lumbar Spine Complete  Result Date: 11/29/2016 CLINICAL DATA:  Fall out of bed this morning. Low back pain. Initial encounter. EXAM: LUMBAR SPINE - COMPLETE 4+ VIEW COMPARISON:  None FINDINGS: There is no evidence of lumbar spine fracture. Alignment is normal. Mild to moderate degenerative disc disease noted from levels of T11-L4. There is a mild wedge compression deformity of the T11 vertebral body, which is of indeterminate age. Generalized osteopenia noted. No focal lytic or sclerotic bone lesions identified. Aortic atherosclerosis and small calcified uterine fibroids again noted. IMPRESSION: No acute lumbar spine findings. Degenerative spondylosis, as described above. Mild wedge compression deformity of T11 vertebral body, which is of indeterminate age. Recommend clinical correlation and consider dedicated thoracic spine radiographs centered at this level. Electronically Signed   By: Earle Gell M.D.   On: 11/29/2016 07:59    Some degenerative spondylosis  Mild wedge comp def of T11- of ? Age   Marzetta Merino  It helps (but it constipated)  Using a heating pad  Asper creme  Back pain is improving slowly  Hurts worse to bend forward / getting up and   Sciatic symptoms No neuro symptoms  No  change in b/b continence    She has no known OP Declined dexa in the past   Urinary symptoms- 2 days Frequency - with less urination  Urgency  Bladder is uncomfortable  No burning to urinate  No blood in urine  No fever or n/v   Results for orders placed or performed in visit on 12/07/16  POCT Urinalysis Dipstick (Automated)  Result Value Ref Range   Color, UA Yellow    Clarity, UA Clear    Glucose, UA Negative    Bilirubin, UA Negative    Ketones, UA Negative    Spec Grav, UA 1.015 1.010 - 1.025   Blood, UA Negative    pH, UA 6.0 5.0 - 8.0   Protein, UA 30 mg/dL    Urobilinogen, UA 0.2 0.2 or 1.0 E.U./dL   Nitrite, UA Negative    Leukocytes, UA Negative Negative     Patient Active Problem List   Diagnosis Date Noted  . Frequent urination 12/07/2016  . Low back pain 12/07/2016  . Fall 12/07/2016  . Thoracic compression fracture ([place]) 12/07/2016  . Estrogen deficiency 12/07/2016  . Lower extremity edema 06/19/2016  . Carotid atherosclerosis 04/29/2016  . Medicare annual wellness visit, initial 05/27/2015  . Routine general medical examination at a health care facility 05/27/2015  . Colon cancer screening 05/27/2015  . Hyposmolality and/or hyponatremia 05/28/2014  . Hypokalemia 05/28/2014  . Hyponatremia 01/31/2014  .  Unspecified constipation 01/31/2014  . Acute diastolic CHF (congestive heart failure) ([place]) 01/31/2014  . Left thyroid nodule 08/04/2012  . Obesity 12/04/2011  . Gastritis 12/04/2011  . Heart palpitations 09/01/2011  . Depression 05/26/2011  . Frequent UTI 12/30/2010  . Lower abdominal pain 11/27/2010  . COLONIC POLYPS 04/09/2010  . Anemia 04/09/2010  . HEMORRHOIDS 03/04/2010  . ANXIETY 12/10/2009  . PERSONAL HX COLONIC POLYPS 06/19/2008  . IRRITABLE BOWEL SYNDROME 03/28/2008  . 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 GOITER 03/29/2007  . Hypothyroidism 03/29/2007  . Hyperlipidemia 03/29/2007  . Essential hypertension 03/29/2007  .  Coronary atherosclerosis 03/29/2007  . Bundle branch block 03/29/2007  . ALLERGIC RHINITIS 03/29/2007  . GERD 03/29/2007  . HYPERGLYCEMIA 03/29/2007   Past Medical History:  Diagnosis Date  . Allergy    allergic rhinitis  . CAD (coronary artery disease)   . CHF (congestive heart failure) ([place])   . GERD (gastroesophageal reflux disease)   . Hyperlipidemia   . Hypertension   . Hypothyroid   . Insomnia    Past Surgical History:  Procedure Laterality Date  . CARDIAC CATHETERIZATION     [place]    . THYROID SURGERY     Social History  Substance Use Topics  . Smoking status: Never Smoker  . Smokeless tobacco: Never Used  . Alcohol use No   Family History  Problem Relation Age of Onset  . Heart disease Mother   . Hypertension Mother    Allergies  Allergen Reactions  . Cephalexin     REACTION: rash and burning sensation to skin on arms and legs with itching.  . Ciprofloxacin     REACTION: burning of mouth and skin  . Elavil [Amitriptyline]     palpitation  . Lisinopril     REACTION: cough  . Penicillins     REACTION: rash  . Remeron [Mirtazapine]     constipation  . Sulfonamide Derivatives     REACTION: malaise  . Tetanus Toxoid     REACTION: swelling  . Vesicare [Solifenacin Succinate] Other (See Comments)    Blurred vision and weakness  . Zoloft [Sertraline Hcl]     GI upset and nausea   Current Outpatient Prescriptions on File Prior to Visit  Medication Sig Dispense Refill  . amLODipine (NORVASC) 10 MG tablet Take 1 tablet (10 mg total) by mouth at bedtime. 90 tablet 3  . aspirin 81 MG tablet Take 81 mg by mouth daily.      . cetirizine (ZYRTEC) 10 MG tablet Take 10 mg by mouth daily as needed for allergies.     Marland Kitchen docusate sodium 100 MG CAPS Take 100 mg by mouth 2 (two) times daily. For constipation 60 capsule 3  . esomeprazole (NEXIUM) 40 MG capsule TAKE 1 CAPSULE (40 MG TOTAL) BY MOUTH DAILY. 90 capsule 2  .  fluticasone (FLONASE) 50 MCG/ACT nasal spray Place 2 sprays into both nostrils daily. 48 g 3  . furosemide (LASIX) 20 MG tablet Take 1 tablet (20 mg total) by mouth 2 (two) times daily as needed. 180 tablet 3  . guaiFENesin (MUCINEX) 600 MG 12 hr tablet Take 600 mg by mouth 2 (two) times daily as needed for cough or to loosen phlegm.     Marland Kitchen KLOR-CON M20 20 MEQ tablet TAKE 1 TABLET (20 MEQ TOTAL) BY MOUTH DAILY. 90 tablet 1  . levothyroxine (SYNTHROID, LEVOTHROID) 25 MCG tablet Take 1 tablet (  25 mcg total) by mouth daily. 90 tablet 3  . losartan (COZAAR) 100 MG tablet TAKE 1 TABLET (100 MG TOTAL) BY MOUTH DAILY. 90 tablet 3  . metoprolol (LOPRESSOR) 50 MG tablet TAKE 1 TABLET BY MOUTH TWICE A DAY 180 tablet 1  . Multiple Vitamin (MULTIVITAMIN) capsule Take 1 capsule by mouth daily.      Marland Kitchen zolpidem (AMBIEN) 10 MG tablet TAKE 1/2-1 TABLET BY MOUTH AT BEDTIME AS NEEDED 30 tablet 3   No current facility-administered medications on file prior to visit.     Review of Systems Review of Systems  Constitutional: Negative for fever, appetite change, fatigue and unexpected weight change.  Eyes: Negative for pain and visual disturbance.  Respiratory: Negative for cough and shortness of breath.   Cardiovascular: Negative for cp or palpitations   pos for some pedal edema intermittently  Gastrointestinal: Negative for nausea, diarrhea and constipation.  Genitourinary: pos for urgency and frequency. neg for flank pain or hematuria or dysuria  Skin: Negative for pallor or rash   MSK pos for low back pain that is improved  Neurological: Negative for weakness, light-headedness, numbness and headaches. pos for generally poor balance Hematological: Negative for adenopathy. Does not bruise/bleed easily.  Psychiatric/Behavioral: Negative for dysphoric mood. The patient is not nervous/anxious.         Objective:   Physical Exam  Constitutional: She appears well-developed and well-nourished. No distress.  obese  and well appearing   HENT:  Head: Normocephalic and atraumatic.  Eyes: Conjunctivae and EOM are normal. Pupils are equal, round, and reactive to light.  Neck: Normal range of motion. Neck supple.  Cardiovascular: Normal rate, regular rhythm and normal heart sounds.   Pulmonary/Chest: Effort normal and breath sounds normal.  Abdominal: Soft. Bowel sounds are normal. She exhibits no distension and no mass. There is tenderness. There is no rebound.  No cva tenderness  Mild suprapubic tenderness  Musculoskeletal: She exhibits edema.       Lumbar back: She exhibits decreased range of motion, tenderness and spasm. She exhibits no bony tenderness, no edema and no deformity.  Pt has tenderness in L LS musculature and piriformis area Nl rom hips  SLR causes some buttock pain in the L side  Flex 80 deg/ ext 10 deg with pai n Pain on L lat bend  Gait is slow and steady No neuro changes   Notable kyphosis  No spinous tenderness  Lymphadenopathy:    She has no cervical adenopathy.  Neurological: She is alert. She has normal reflexes. She displays no atrophy. No sensory deficit. She exhibits normal muscle tone. Coordination and gait normal.  Skin: No rash noted. No pallor.  Psychiatric: She has a normal mood and affect.          Assessment & Plan:   Problem List Items Addressed This Visit      Cardiovascular and Mediastinum   Acute diastolic CHF (congestive heart failure) (HCC)    Pt has had slt more pedal edema lately  Disc imp of leg elevation/na restriction and water intake Wt up 3 lb Will continue to watch Continue furosemide       Essential hypertension    bp was elevated in ED -due to pain and anxiety  Improved today  bp in fair control at this time  BP Readings from Last 1 Encounters:  12/07/16 140/68   No changes needed Disc lifstyle change with low sodium diet and exercise  Musculoskeletal and Integument   Thoracic compression fracture (HCC)    Old T11  fx seen on LS film  Pt now agreeable to dexa-ordered Disc ca and d and fall prev  Suspect bone loss Also kyphosis         Other   Estrogen deficiency    Ref for dexa       Relevant Orders   DG Bone Density   Fall    At home- getting out of bed Long disc re: fall prev with pt and her family  Handout given  Plan made for getting out of bed slowly and with good light  Denies need for bedside commode      Frequent urination    UA today is bland Poss overactive bladder Urine cx ordered  Disc limiting beverages to water alone Continue to follow      Low back pain    After a fall  Reviewed hospital records, lab results and studies in detail  -from ED visit  Reassuring exam today  rec heat/ice and walking  Can stop nsaid  Update if not continuing  to improve in a week or if worsening         Other Visit Diagnoses    Urinary frequency    -  Primary   Relevant Orders   POCT Urinalysis Dipstick (Automated) (Completed)   Urine culture

## 2016-12-07 NOTE — Assessment & Plan Note (Signed)
Ref for dexa 

## 2016-12-07 NOTE — Assessment & Plan Note (Signed)
Old T11 fx seen on LS film  Pt now agreeable to dexa-ordered Disc ca and d and fall prev  Suspect bone loss Also kyphosis

## 2016-12-07 NOTE — Assessment & Plan Note (Signed)
At home- getting out of bed Long disc re: fall prev with pt and her family  Handout given  Plan made for getting out of bed slowly and with good light  Denies need for bedside commode

## 2016-12-07 NOTE — Progress Notes (Signed)
Pre visit review using our clinic review tool, if applicable. No additional management support is needed unless otherwise documented below in the visit note. 

## 2016-12-07 NOTE — Assessment & Plan Note (Signed)
Pt has had slt more pedal edema lately  Disc imp of leg elevation/na restriction and water intake Wt up 3 lb Will continue to watch Continue furosemide

## 2016-12-07 NOTE — Assessment & Plan Note (Signed)
UA today is bland Poss overactive bladder Urine cx ordered  Disc limiting beverages to water alone Continue to follow

## 2016-12-10 LAB — URINE CULTURE

## 2016-12-13 ENCOUNTER — Telehealth: Payer: Self-pay | Admitting: Family Medicine

## 2016-12-13 MED ORDER — LEVOFLOXACIN 250 MG PO TABS: 250.0000 mg | ORAL_TABLET | Freq: Every day | ORAL | 0 refills | Status: DC

## 2016-12-13 NOTE — Telephone Encounter (Signed)
-----   Message from Tammi Sou, [place] sent at 12/11/2016  2:53 PM EDT ----- Pt notified of urine cx results and Dr. Marliss Coots comments. Pt said it's been so long she can't remember how bad the cipro abx reaction was but she did say that the sulfa and penicillin did cause a bad rash all over so she is afraid to try them again. Pt said she isn't sure of any abx that's on her med list that she didn't have a bad reaction to so she isn't sure what to recommend, but she said she does need something because of her sxs she still has

## 2016-12-13 NOTE — Telephone Encounter (Signed)
She had a sensation of itching and mouth burning with cipro  I am going to send in levauqin- it is related and may produce similar side effects (hopefully not) Will use the lower dose  Drink lots of water  We do not really have other options unfortunately   If significant side effects or rash please stop it and update [place]  If no improvement also update [place]   Sent to [facility]

## 2016-12-14 NOTE — Telephone Encounter (Signed)
Patient notified as instructed by telephone and verbalized understanding. 

## 2017-01-11 ENCOUNTER — Other Ambulatory Visit: Payer: Self-pay | Admitting: Cardiovascular Disease

## 2017-01-26 ENCOUNTER — Ambulatory Visit: Payer: Medicare Other | Attending: Family Medicine

## 2017-02-18 ENCOUNTER — Other Ambulatory Visit: Payer: Self-pay | Admitting: Family Medicine

## 2017-02-18 NOTE — Telephone Encounter (Signed)
Px written for call in   

## 2017-02-18 NOTE — Telephone Encounter (Signed)
CPE scheduled 06/18/17, last filled on 10/20/16 #30 tabs with 3 additional refills, please advise

## 2017-02-19 NOTE — Telephone Encounter (Signed)
Rx called in as prescribed 

## 2017-02-26 ENCOUNTER — Emergency Department: Payer: Medicare Other

## 2017-02-26 ENCOUNTER — Inpatient Hospital Stay
Admission: EM | Admit: 2017-02-26 | Discharge: 2017-03-03 | DRG: 286 | Disposition: A | Discharge status: Home / Self Care | Payer: Medicare Other | Attending: Internal Medicine | Admitting: Internal Medicine

## 2017-02-26 ENCOUNTER — Encounter: Payer: Self-pay | Admitting: Emergency Medicine

## 2017-02-26 DIAGNOSIS — E669 Obesity, unspecified: Secondary | ICD-10-CM | POA: Diagnosis present

## 2017-02-26 DIAGNOSIS — Z882 Allergy status to sulfonamides status: Secondary | ICD-10-CM

## 2017-02-26 DIAGNOSIS — I2510 Atherosclerotic heart disease of native coronary artery without angina pectoris: Secondary | ICD-10-CM | POA: Diagnosis not present

## 2017-02-26 DIAGNOSIS — Z79899 Other long term (current) drug therapy: Secondary | ICD-10-CM | POA: Diagnosis not present

## 2017-02-26 DIAGNOSIS — N179 Acute kidney failure, unspecified: Secondary | ICD-10-CM | POA: Diagnosis not present

## 2017-02-26 DIAGNOSIS — Z880 Allergy status to penicillin: Secondary | ICD-10-CM

## 2017-02-26 DIAGNOSIS — I429 Cardiomyopathy, unspecified: Secondary | ICD-10-CM | POA: Diagnosis present

## 2017-02-26 DIAGNOSIS — E785 Hyperlipidemia, unspecified: Secondary | ICD-10-CM | POA: Diagnosis present

## 2017-02-26 DIAGNOSIS — R55 Syncope and collapse: Secondary | ICD-10-CM | POA: Diagnosis present

## 2017-02-26 DIAGNOSIS — R0902 Hypoxemia: Secondary | ICD-10-CM

## 2017-02-26 DIAGNOSIS — Z66 Do not resuscitate: Secondary | ICD-10-CM | POA: Diagnosis present

## 2017-02-26 DIAGNOSIS — J9601 Acute respiratory failure with hypoxia: Secondary | ICD-10-CM | POA: Diagnosis not present

## 2017-02-26 DIAGNOSIS — I5033 Acute on chronic diastolic (congestive) heart failure: Secondary | ICD-10-CM | POA: Diagnosis present

## 2017-02-26 DIAGNOSIS — Z7951 Long term (current) use of inhaled steroids: Secondary | ICD-10-CM | POA: Diagnosis not present

## 2017-02-26 DIAGNOSIS — Z8249 Family history of ischemic heart disease and other diseases of the circulatory system: Secondary | ICD-10-CM

## 2017-02-26 DIAGNOSIS — I5021 Acute systolic (congestive) heart failure: Secondary | ICD-10-CM | POA: Diagnosis not present

## 2017-02-26 DIAGNOSIS — I493 Ventricular premature depolarization: Secondary | ICD-10-CM

## 2017-02-26 DIAGNOSIS — I110 Hypertensive heart disease with heart failure: Principal | ICD-10-CM | POA: Diagnosis present

## 2017-02-26 DIAGNOSIS — Z6831 Body mass index (BMI) 31.0-31.9, adult: Secondary | ICD-10-CM | POA: Diagnosis not present

## 2017-02-26 DIAGNOSIS — I951 Orthostatic hypotension: Secondary | ICD-10-CM | POA: Diagnosis present

## 2017-02-26 DIAGNOSIS — Z7982 Long term (current) use of aspirin: Secondary | ICD-10-CM

## 2017-02-26 DIAGNOSIS — I5043 Acute on chronic combined systolic (congestive) and diastolic (congestive) heart failure: Secondary | ICD-10-CM | POA: Diagnosis present

## 2017-02-26 DIAGNOSIS — E039 Hypothyroidism, unspecified: Secondary | ICD-10-CM | POA: Diagnosis present

## 2017-02-26 DIAGNOSIS — R0603 Acute respiratory distress: Secondary | ICD-10-CM | POA: Diagnosis present

## 2017-02-26 DIAGNOSIS — N170 Acute kidney failure with tubular necrosis: Secondary | ICD-10-CM | POA: Diagnosis present

## 2017-02-26 DIAGNOSIS — I5020 Unspecified systolic (congestive) heart failure: Secondary | ICD-10-CM

## 2017-02-26 DIAGNOSIS — I10 Essential (primary) hypertension: Secondary | ICD-10-CM | POA: Diagnosis present

## 2017-02-26 DIAGNOSIS — Z888 Allergy status to other drugs, medicaments and biological substances status: Secondary | ICD-10-CM | POA: Diagnosis not present

## 2017-02-26 DIAGNOSIS — I5023 Acute on chronic systolic (congestive) heart failure: Secondary | ICD-10-CM | POA: Diagnosis present

## 2017-02-26 DIAGNOSIS — Z9889 Other specified postprocedural states: Secondary | ICD-10-CM | POA: Diagnosis not present

## 2017-02-26 DIAGNOSIS — K219 Gastro-esophageal reflux disease without esophagitis: Secondary | ICD-10-CM | POA: Diagnosis present

## 2017-02-26 DIAGNOSIS — I447 Left bundle-branch block, unspecified: Secondary | ICD-10-CM | POA: Diagnosis present

## 2017-02-26 DIAGNOSIS — I509 Heart failure, unspecified: Secondary | ICD-10-CM

## 2017-02-26 DIAGNOSIS — I420 Dilated cardiomyopathy: Secondary | ICD-10-CM | POA: Diagnosis not present

## 2017-02-26 LAB — HEPATIC FUNCTION PANEL
ALBUMIN: 3.9 g/dL (ref 3.5–5.0)
ALT: 10 U/L — AB (ref 14–54)
AST: 19 U/L (ref 15–41)
Alkaline Phosphatase: 100 U/L (ref 38–126)
TOTAL PROTEIN: 7.3 g/dL (ref 6.5–8.1)
Total Bilirubin: 0.4 mg/dL (ref 0.3–1.2)

## 2017-02-26 LAB — DIFFERENTIAL
BASOS ABS: 0 10*3/uL (ref 0–0.1)
BASOS PCT: 1 %
Eosinophils Absolute: 0.1 10*3/uL (ref 0–0.7)
Eosinophils Relative: 1 %
Lymphocytes Relative: 16 %
Lymphs Abs: 1 10*3/uL (ref 1.0–3.6)
MONO ABS: 0.8 10*3/uL (ref 0.2–0.9)
MONOS PCT: 12 %
Neutro Abs: 4.5 10*3/uL (ref 1.4–6.5)
Neutrophils Relative %: 70 %

## 2017-02-26 LAB — CBC
HEMATOCRIT: 30.8 % — AB (ref 35.0–47.0)
Hemoglobin: 10.4 g/dL — ABNORMAL LOW (ref 12.0–16.0)
MCH: 29 pg (ref 26.0–34.0)
MCHC: 33.6 g/dL (ref 32.0–36.0)
MCV: 86.1 fL (ref 80.0–100.0)
Platelets: 278 10*3/uL (ref 150–440)
RBC: 3.57 MIL/uL — ABNORMAL LOW (ref 3.80–5.20)
RDW: 15.5 % — AB (ref 11.5–14.5)
WBC: 6.1 10*3/uL (ref 3.6–11.0)

## 2017-02-26 LAB — LACTIC ACID, PLASMA: LACTIC ACID, VENOUS: 1.2 mmol/L (ref 0.5–1.9)

## 2017-02-26 LAB — BASIC METABOLIC PANEL
Anion gap: 10 (ref 5–15)
BUN: 6 mg/dL (ref 6–20)
CHLORIDE: 95 mmol/L — AB (ref 101–111)
CO2: 24 mmol/L (ref 22–32)
Calcium: 8.7 mg/dL — ABNORMAL LOW (ref 8.9–10.3)
Creatinine, Ser: 0.7 mg/dL (ref 0.44–1.00)
GFR calc Af Amer: >60 mL/min (ref >60)
Glucose, Bld: 126 mg/dL — ABNORMAL HIGH (ref 65–99)
Potassium: 3.4 mmol/L — ABNORMAL LOW (ref 3.5–5.1)
SODIUM: 129 mmol/L — AB (ref 135–145)

## 2017-02-26 LAB — BRAIN NATRIURETIC PEPTIDE: B NATRIURETIC PEPTIDE 5: 1298 pg/mL — AB (ref 0.0–100.0)

## 2017-02-26 LAB — TROPONIN I: Troponin I: <0.03 ng/mL (ref <0.03)

## 2017-02-26 MED ORDER — IPRATROPIUM-ALBUTEROL 0.5-2.5 (3) MG/3ML IN SOLN
3.0000 mL | Freq: Once | RESPIRATORY_TRACT | Status: AC
  Administered 2017-02-26: 3 mL via RESPIRATORY_TRACT
  Filled 2017-02-26: qty 3

## 2017-02-26 MED ORDER — FUROSEMIDE 10 MG/ML IJ SOLN
20.0000 mg | Freq: Once | INTRAMUSCULAR | Status: AC
  Administered 2017-02-26: 20 mg via INTRAVENOUS
  Filled 2017-02-26: qty 4

## 2017-02-26 MED ORDER — METHYLPREDNISOLONE SODIUM SUCC 125 MG IJ SOLR
125.0000 mg | Freq: Once | INTRAMUSCULAR | Status: AC
  Administered 2017-02-26: 125 mg via INTRAVENOUS
  Filled 2017-02-26: qty 2

## 2017-02-26 NOTE — ED Provider Notes (Signed)
[facility] Emergency Department Provider Note   ____________________________________________   First MD Initiated Contact with Patient 02/26/17 2027     (approximate)  I have reviewed the triage vital signs and the nursing notes.   HISTORY  Chief Complaint Shortness of Breath    HPI Kimberly [name] is a 83 y.o. female patient reports she got up this morning with some postnasal drip and felt short of breath. She also has had some increased swelling in her legs. She [facility] and when she was walking there was noted to have an O2 sat of 84%.  Past Medical History:  Diagnosis Date  . Allergy    allergic rhinitis  . CAD (coronary artery disease)   . CHF (congestive heart failure) ([place])   . GERD (gastroesophageal reflux disease)   . Hyperlipidemia   . Hypertension   . Hypothyroid   . Insomnia     Patient Active Problem List   Diagnosis Date Noted  . Frequent urination 12/07/2016  . Low back pain 12/07/2016  . Fall 12/07/2016  . Thoracic compression fracture ([place]) 12/07/2016  . Estrogen deficiency 12/07/2016  . Lower extremity edema 06/19/2016  . Carotid atherosclerosis 04/29/2016  . Medicare annual wellness visit, initial 05/27/2015  . Routine general medical examination at a health care facility 05/27/2015  . Colon cancer screening 05/27/2015  . Hyposmolality and/or hyponatremia 05/28/2014  . Hypokalemia 05/28/2014  . Hyponatremia 01/31/2014  . Unspecified constipation 01/31/2014  . Acute diastolic CHF (congestive heart failure) ([place]) 01/31/2014  . Left thyroid nodule 08/04/2012  . Obesity 12/04/2011  . Gastritis 12/04/2011  . Heart palpitations 09/01/2011  . Depression 05/26/2011  . Frequent UTI 12/30/2010  . Lower abdominal pain 11/27/2010  . COLONIC POLYPS 04/09/2010  . Anemia 04/09/2010  . HEMORRHOIDS 03/04/2010  . ANXIETY 12/10/2009  . PERSONAL HX COLONIC POLYPS 06/19/2008  . IRRITABLE BOWEL SYNDROME 03/28/2008  .  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 GOITER 03/29/2007  . Hypothyroidism 03/29/2007  . Hyperlipidemia 03/29/2007  . Essential hypertension 03/29/2007  . Coronary atherosclerosis 03/29/2007  . Bundle branch block 03/29/2007  . ALLERGIC RHINITIS 03/29/2007  . GERD 03/29/2007  . HYPERGLYCEMIA 03/29/2007    Past Surgical History:  Procedure Laterality Date  . CARDIAC CATHETERIZATION     [place]    . THYROID SURGERY      Prior to Admission medications   Medication Sig Start Date End Date Taking? Authorizing Provider  amLODipine (NORVASC) 10 MG tablet TAKE 1 TABLET (10 MG TOTAL) BY MOUTH AT BEDTIME. 01/11/17   Gollan, Kathlene November, MD  aspirin 81 MG tablet Take 81 mg by mouth daily.      [provider]  cetirizine (ZYRTEC) 10 MG tablet Take 10 mg by mouth daily as needed for allergies.     [provider]  docusate sodium 100 MG CAPS Take 100 mg by mouth 2 (two) times daily. For constipation 02/02/14   Rai, Ripudeep K, MD  esomeprazole (NEXIUM) 40 MG capsule TAKE 1 CAPSULE (40 MG TOTAL) BY MOUTH DAILY. 10/12/16   Tower, Wynelle Fanny, MD  fluticasone (FLONASE) 50 MCG/ACT nasal spray Place 2 sprays into both nostrils daily. 10/13/13   Tower, Wynelle Fanny, MD  furosemide (LASIX) 20 MG tablet Take 1 tablet (20 mg total) by mouth 2 (two) times daily as needed. 06/19/16   Minna Merritts, MD  guaiFENesin (MUCINEX) 600 MG 12  hr tablet Take 600 mg by mouth 2 (two) times daily as needed for cough or to loosen phlegm.     [provider]  KLOR-CON M20 20 MEQ tablet TAKE 1 TABLET (20 MEQ TOTAL) BY MOUTH DAILY. 10/19/16   Tower, Wynelle Fanny, MD  levofloxacin (LEVAQUIN) 250 MG tablet Take 1 tablet (250 mg total) by mouth daily. 12/13/16   Tower, Wynelle Fanny, MD  levothyroxine (SYNTHROID, LEVOTHROID) 25 MCG tablet Take 1 tablet (25 mcg total) by mouth daily. 06/16/16   Tower, Wynelle Fanny, MD  losartan (COZAAR) 100 MG tablet TAKE 1 TABLET (100 MG  TOTAL) BY MOUTH DAILY. 06/16/16   Tower, Wynelle Fanny, MD  metoprolol (LOPRESSOR) 50 MG tablet TAKE 1 TABLET BY MOUTH TWICE A DAY 12/02/16   Tower, Wynelle Fanny, MD  Multiple Vitamin (MULTIVITAMIN) capsule Take 1 capsule by mouth daily.      [provider]  zolpidem (AMBIEN) 10 MG tablet TAKE 1/2 TO 1 TABLET BY MOUTH DAILY AT BEDTIME AS NEEDED 02/18/17   Tower, Wynelle Fanny, MD    Allergies Cephalexin; Ciprofloxacin; Elavil [amitriptyline]; Lisinopril; Penicillins; Remeron [mirtazapine]; Sulfonamide derivatives; Tetanus toxoid; Vesicare [solifenacin succinate]; and Zoloft [sertraline hcl]  Family History  Problem Relation Age of Onset  . Heart disease Mother   . Hypertension Mother     Social History Social History  Substance Use Topics  . Smoking status: Never Smoker  . Smokeless tobacco: Never Used  . Alcohol use No    Review of Systems  Constitutional: No fever/chills Eyes: No visual changes. ENT: No sore throat. Cardiovascular: Denies chest pain. Respiratory:  shortness of breath. Gastrointestinal: No abdominal pain.  No nausea, no vomiting.  No diarrhea.  No constipation. Genitourinary: Negative for dysuria. Musculoskeletal: Negative for back pain. Skin: Negative for rash. Neurological: Negative for headaches, focal weakness or numbness.   ____________________________________________   PHYSICAL EXAM:  VITAL SIGNS: ED Triage Vitals  Enc Vitals Group     BP 02/26/17 1737 (!) 161/94     Pulse Rate 02/26/17 1737 93     Resp 02/26/17 1737 16     Temp 02/26/17 1737 98.3 F (36.8 C)     Temp Source 02/26/17 1737 Oral     SpO2 02/26/17 1737 94 %     Weight 02/26/17 1736 182 lb (82.6 kg)     Height 02/26/17 1736 5\' 3"  (1.6 m)     Head Circumference --      Peak Flow --      Pain Score --      Pain Loc --      Pain Edu? --      Excl. in [place]? --     Constitutional: Alert and oriented. Well appearing and in no acute distress. Eyes: Conjunctivae are normal. PERRL.  EOMI. Head: Atraumatic. Nose: No congestion/rhinnorhea. Mouth/Throat: Mucous membranes are moist.  Oropharynx non-erythematous. Neck: No stridor.   Cardiovascular: Normal rate, regular rhythm. Grossly normal heart sounds.  Good peripheral circulation. Respiratory: Normal respiratory effort.  No retractions. Lungs CTAB. Gastrointestinal: Soft and nontender. No distention. No abdominal bruits. No CVA tenderness. Musculoskeletal: No lower extremity tenderness nor edema.  No joint effusions. Neurologic:  Normal speech and language. No gross focal neurologic deficits are appreciated. No gait instability. Skin:  Skin is warm, dry and intact. No rash noted. Psychiatric: Mood and affect are normal. Speech and behavior are normal.  ____________________________________________   LABS (all labs ordered are listed, but only abnormal results are displayed)  Labs Reviewed  BASIC METABOLIC PANEL - Abnormal; Notable for the following:       Result Value   Sodium 129 (*)    Potassium 3.4 (*)    Chloride 95 (*)    Glucose, Bld 126 (*)    Calcium 8.7 (*)    All other components within normal limits  CBC - Abnormal; Notable for the following:    RBC 3.57 (*)    Hemoglobin 10.4 (*)    HCT 30.8 (*)    RDW 15.5 (*)    All other components within normal limits  BRAIN NATRIURETIC PEPTIDE - Abnormal; Notable for the following:    B Natriuretic Peptide 1,298.0 (*)    All other components within normal limits  HEPATIC FUNCTION PANEL - Abnormal; Notable for the following:    ALT 10 (*)    Bilirubin, Direct <0.1 (*)    All other components within normal limits  TROPONIN I  LACTIC ACID, PLASMA  DIFFERENTIAL  LACTIC ACID, PLASMA   ____________________________________________  [facility] read and interpreted by me shows normal sinus rhythm rate of 93 left axis left bundle-branch block with multiple PVCs ____________________________________________  RADIOLOGY  Dg Chest 2 View  Result Date:  02/26/2017 CLINICAL DATA:  Sinus congestion shortness of breath EXAM: CHEST  2 VIEW COMPARISON:  04/27/2014, ultrasound 08/04/2012 FINDINGS: Low lung volumes with chronic elevation and lobulation of right diaphragm. Hyperinflation. No focal consolidation or pleural effusion is seen. Coarse diffuse interstitial opacity. Borderline cardiomegaly with atherosclerosis. Tracheal deviation to the right, likely corresponding to ultrasound demonstrated left thyroid mass. No pneumothorax. Moderate lower thoracic compression, about 40% loss of height anteriorly, progressed since prior radiograph. IMPRESSION: 1. Diffuse coarse interstitial pulmonary opacity could relate to bronchial inflammation. No focal infiltrate is seen 2. Borderline cardiomegaly 3. Progressed compression fracture of the lower thoracic spine. Electronically Signed   By: Donavan Foil M.D.   On: 02/26/2017 18:16    ____________________________________________   PROCEDURES  Procedure(s) performed  Procedures  Critical Care performed:  ____________________________________________   INITIAL IMPRESSION / ASSESSMENT AND PLAN / ED COURSE  Pertinent labs & imaging results that were available during my care of the patient were reviewed by me and considered in my medical decision making (see chart for details).        ____________________________________________   FINAL CLINICAL IMPRESSION(S) / ED DIAGNOSES  Final diagnoses:  Hypoxia  Systolic congestive heart failure, unspecified HF chronicity (HCC)      NEW MEDICATIONS STARTED DURING THIS VISIT:  New Prescriptions   No medications on file     Note:  This document was prepared using Dragon voice recognition software and may include unintentional dictation errors.    Nena Polio, MD 02/26/17 2328

## 2017-02-26 NOTE — ED Triage Notes (Signed)
C/O sinus congestion and getting "choked on phelm".  Patient went to [facility] and was sent to ED for evaluation  Due to "sats dorp to 84% with ambulation and laying supine 90%".  Also abnormal EKG with no prior EKG to compare.

## 2017-02-26 NOTE — ED Notes (Signed)
Pt. States difficulty breathing that started this a.m.  Pt. Has bilateral swelling to lower extremities.

## 2017-02-27 ENCOUNTER — Inpatient Hospital Stay
Admit: 2017-02-27 | Discharge: 2017-02-27 | Disposition: A | Discharge status: Home / Self Care | Payer: Medicare Other | Attending: Internal Medicine | Admitting: Internal Medicine

## 2017-02-27 LAB — CBC
HCT: 31.4 % — ABNORMAL LOW (ref 35.0–47.0)
Hemoglobin: 10.6 g/dL — ABNORMAL LOW (ref 12.0–16.0)
MCH: 29.4 pg (ref 26.0–34.0)
MCHC: 33.9 g/dL (ref 32.0–36.0)
MCV: 86.6 fL (ref 80.0–100.0)
PLATELETS: 279 10*3/uL (ref 150–440)
RBC: 3.62 MIL/uL — ABNORMAL LOW (ref 3.80–5.20)
RDW: 15.4 % — AB (ref 11.5–14.5)
WBC: 5.3 10*3/uL (ref 3.6–11.0)

## 2017-02-27 LAB — BASIC METABOLIC PANEL
ANION GAP: 11 (ref 5–15)
BUN: 7 mg/dL (ref 6–20)
CALCIUM: 9 mg/dL (ref 8.9–10.3)
CO2: 27 mmol/L (ref 22–32)
Chloride: 95 mmol/L — ABNORMAL LOW (ref 101–111)
Creatinine, Ser: 0.86 mg/dL (ref 0.44–1.00)
GFR, EST NON AFRICAN AMERICAN: 59 mL/min — AB (ref >60)
Glucose, Bld: 168 mg/dL — ABNORMAL HIGH (ref 65–99)
Potassium: 3.5 mmol/L (ref 3.5–5.1)
SODIUM: 133 mmol/L — AB (ref 135–145)

## 2017-02-27 LAB — ECHOCARDIOGRAM COMPLETE
HEIGHTINCHES: 63 in
Weight: 2833.6 oz

## 2017-02-27 MED ORDER — FUROSEMIDE 10 MG/ML IJ SOLN
40.0000 mg | Freq: Once | INTRAMUSCULAR | Status: AC
  Administered 2017-02-27: 40 mg via INTRAVENOUS
  Filled 2017-02-27: qty 4

## 2017-02-27 MED ORDER — SACUBITRIL-VALSARTAN 24-26 MG PO TABS: 1.0000 | ORAL_TABLET | Freq: Two times a day (BID) | ORAL | 0 refills | Status: DC

## 2017-02-27 MED ORDER — ENOXAPARIN SODIUM 40 MG/0.4ML ~~LOC~~ SOLN
40.0000 mg | SUBCUTANEOUS | Status: DC
  Administered 2017-02-27 – 2017-02-28 (×2): 40 mg via SUBCUTANEOUS
  Filled 2017-02-27 (×2): qty 0.4

## 2017-02-27 MED ORDER — ACETAMINOPHEN 650 MG RE SUPP: 650.0000 mg | Freq: Four times a day (QID) | RECTAL | Status: DC | PRN

## 2017-02-27 MED ORDER — SODIUM CHLORIDE 0.9 % IV SOLN
1.0000 g | Freq: Once | INTRAVENOUS | Status: AC
  Administered 2017-02-27: 1 g via INTRAVENOUS
  Filled 2017-02-27: qty 10

## 2017-02-27 MED ORDER — SACUBITRIL-VALSARTAN 24-26 MG PO TABS
1.0000 | ORAL_TABLET | Freq: Two times a day (BID) | ORAL | Status: DC
  Administered 2017-02-27 – 2017-03-01 (×6): 1 via ORAL
  Filled 2017-02-27 (×6): qty 1

## 2017-02-27 MED ORDER — FUROSEMIDE 10 MG/ML IJ SOLN
60.0000 mg | Freq: Two times a day (BID) | INTRAMUSCULAR | Status: DC
  Administered 2017-02-27 – 2017-02-28 (×3): 60 mg via INTRAVENOUS
  Filled 2017-02-27 (×4): qty 6

## 2017-02-27 MED ORDER — LEVOTHYROXINE SODIUM 25 MCG PO TABS
25.0000 ug | ORAL_TABLET | Freq: Every day | ORAL | Status: DC
  Administered 2017-02-27 – 2017-03-03 (×5): 25 ug via ORAL
  Filled 2017-02-27 (×5): qty 1

## 2017-02-27 MED ORDER — ONDANSETRON HCL 4 MG/2ML IJ SOLN: 4.0000 mg | Freq: Four times a day (QID) | INTRAMUSCULAR | Status: DC | PRN

## 2017-02-27 MED ORDER — ZOLPIDEM TARTRATE 5 MG PO TABS
5.0000 mg | ORAL_TABLET | Freq: Every evening | ORAL | Status: DC | PRN
  Administered 2017-02-27 – 2017-03-01 (×3): 5 mg via ORAL
  Filled 2017-02-27 (×3): qty 1

## 2017-02-27 MED ORDER — METOPROLOL TARTRATE 50 MG PO TABS
50.0000 mg | ORAL_TABLET | Freq: Two times a day (BID) | ORAL | Status: DC
  Administered 2017-02-27 (×2): 50 mg via ORAL
  Filled 2017-02-27 (×2): qty 1

## 2017-02-27 MED ORDER — SPIRONOLACTONE 25 MG PO TABS
25.0000 mg | ORAL_TABLET | Freq: Every day | ORAL | Status: DC
  Administered 2017-02-27 – 2017-02-28 (×2): 25 mg via ORAL
  Filled 2017-02-27 (×3): qty 1

## 2017-02-27 MED ORDER — CARVEDILOL 12.5 MG PO TABS
12.5000 mg | ORAL_TABLET | Freq: Two times a day (BID) | ORAL | Status: DC
  Administered 2017-02-27 – 2017-02-28 (×3): 12.5 mg via ORAL
  Filled 2017-02-27 (×4): qty 1

## 2017-02-27 MED ORDER — ONDANSETRON HCL 4 MG PO TABS: 4.0000 mg | ORAL_TABLET | Freq: Four times a day (QID) | ORAL | Status: DC | PRN

## 2017-02-27 MED ORDER — ASPIRIN 81 MG PO CHEW
81.0000 mg | CHEWABLE_TABLET | Freq: Every day | ORAL | Status: DC
  Administered 2017-02-27 – 2017-03-02 (×4): 81 mg via ORAL
  Filled 2017-02-27 (×4): qty 1

## 2017-02-27 MED ORDER — AMLODIPINE BESYLATE 10 MG PO TABS: 10.0000 mg | ORAL_TABLET | Freq: Every day | ORAL | Status: DC

## 2017-02-27 MED ORDER — ACETAMINOPHEN 325 MG PO TABS: 650.0000 mg | ORAL_TABLET | Freq: Four times a day (QID) | ORAL | Status: DC | PRN

## 2017-02-27 MED ORDER — LOSARTAN POTASSIUM 50 MG PO TABS
100.0000 mg | ORAL_TABLET | Freq: Every day | ORAL | Status: DC
  Administered 2017-02-27: 100 mg via ORAL
  Filled 2017-02-27: qty 2

## 2017-02-27 MED ORDER — CARVEDILOL 12.5 MG PO TABS: 12.5000 mg | ORAL_TABLET | Freq: Two times a day (BID) | ORAL | 0 refills | Status: DC

## 2017-02-27 MED ORDER — PANTOPRAZOLE SODIUM 40 MG PO TBEC
40.0000 mg | DELAYED_RELEASE_TABLET | Freq: Every day | ORAL | Status: DC
  Administered 2017-02-27 – 2017-03-03 (×5): 40 mg via ORAL
  Filled 2017-02-27 (×5): qty 1

## 2017-02-27 MED ORDER — POTASSIUM CHLORIDE CRYS ER 20 MEQ PO TBCR
40.0000 meq | EXTENDED_RELEASE_TABLET | ORAL | Status: AC
Start: 2017-02-27 — End: 2017-02-27
  Administered 2017-02-27 (×2): 40 meq via ORAL
  Filled 2017-02-27 (×2): qty 2

## 2017-02-27 MED ORDER — FUROSEMIDE 20 MG PO TABS
20.0000 mg | ORAL_TABLET | Freq: Two times a day (BID) | ORAL | Status: DC
  Administered 2017-02-27: 20 mg via ORAL
  Filled 2017-02-27: qty 1

## 2017-02-27 NOTE — ED Notes (Signed)
Report finished att, arranging transportation

## 2017-02-27 NOTE — Progress Notes (Signed)
Pt arrived via stretcher from ED with daughter at bedside. No complaints of pain at this time. Pt A&O. Telemetry monitor applied and called to CCMD. Yellow socks placed. Oriented to room and call bell. Fall risk sighned

## 2017-02-27 NOTE — H&P (Signed)
[facility] NAME: Kimberly [name]    MR#:  [id number]  DATE OF BIRTH:  [birth year]  DATE OF ADMISSION:  02/26/2017  PRIMARY CARE PHYSICIAN: Tower, Wynelle Fanny, MD   REQUESTING/REFERRING PHYSICIAN: Cinda Quest, MD  CHIEF COMPLAINT:   Chief Complaint  Patient presents with  . Shortness of Breath    HISTORY OF PRESENT ILLNESS:  Kimberly [name]  is a 83 y.o. female who presents with Progressive lower extremity edema and shortness of breath. Evaluation here in the ED is consistent with exacerbation of her heart failure, with a significantly elevated BNP and chest x-ray showing vascular congestion. Hospitals were called for admission  PAST MEDICAL HISTORY:   Past Medical History:  Diagnosis Date  . Allergy    allergic rhinitis  . CAD (coronary artery disease)   . CHF (congestive heart failure) ([place])   . GERD (gastroesophageal reflux disease)   . Hyperlipidemia   . Hypertension   . Hypothyroid   . Insomnia     PAST SURGICAL HISTORY:   Past Surgical History:  Procedure Laterality Date  . CARDIAC CATHETERIZATION     [place]    . THYROID SURGERY      SOCIAL HISTORY:   Social History  Substance Use Topics  . Smoking status: Never Smoker  . Smokeless tobacco: Never Used  . Alcohol use No    FAMILY HISTORY:   Family History  Problem Relation Age of Onset  . Heart disease Mother   . Hypertension Mother     DRUG ALLERGIES:   Allergies  Allergen Reactions  . Ciprofloxacin Other (See Comments)    REACTION: burning of mouth and skin  . Lisinopril Cough  . Remeron [Mirtazapine] Other (See Comments)    constipation  . Sulfonamide Derivatives Other (See Comments)    REACTION: malaise  . Tetanus Toxoid Swelling  . Vesicare [Solifenacin Succinate] Other (See Comments)    Blurred vision and weakness  . Zoloft [Sertraline Hcl] Nausea And Vomiting  . Cephalexin Rash and Other (See  Comments)    REACTION: rash and burning sensation to skin on arms and legs with itching.  Madelin Headings [Amitriptyline] Palpitations  . Penicillins Rash and Other (See Comments)    Has patient had a PCN reaction causing immediate rash, facial/tongue/throat swelling, SOB or lightheadedness with hypotension: Unknown Has patient had a PCN reaction causing severe rash involving mucus membranes or skin necrosis: Unknown Has patient had a PCN reaction that required hospitalization: Unknown Has patient had a PCN reaction occurring within the last 10 years: No If all of the above answers are "NO", then may proceed with Cephalosporin use. Has since take Augmentin with no problem    MEDICATIONS AT HOME:   Prior to Admission medications   Medication Sig Start Date End Date Taking? Authorizing Provider  aspirin 81 MG tablet Take 81 mg by mouth daily.     Yes [provider]  cetirizine (ZYRTEC) 10 MG tablet Take 10 mg by mouth daily as needed for allergies.    Yes [provider]  Multiple Vitamin (MULTIVITAMIN WITH MINERALS) TABS tablet Take 1 tablet by mouth daily.   Yes [provider]  zolpidem (AMBIEN) 10 MG tablet TAKE 1/2 TO 1 TABLET BY MOUTH DAILY AT BEDTIME AS NEEDED 02/18/17  Yes Tower, Marne A, MD  amLODipine (NORVASC) 10 MG tablet TAKE 1 TABLET (10 MG TOTAL) BY MOUTH  AT BEDTIME. 01/11/17   Minna Merritts, MD  docusate sodium 100 MG CAPS Take 100 mg by mouth 2 (two) times daily. For constipation 02/02/14   Rai, Ripudeep K, MD  esomeprazole (NEXIUM) 40 MG capsule TAKE 1 CAPSULE (40 MG TOTAL) BY MOUTH DAILY. 10/12/16   Tower, Wynelle Fanny, MD  fluticasone (FLONASE) 50 MCG/ACT nasal spray Place 2 sprays into both nostrils daily. 10/13/13   Tower, Wynelle Fanny, MD  furosemide (LASIX) 20 MG tablet Take 1 tablet (20 mg total) by mouth 2 (two) times daily as needed. 06/19/16   Minna Merritts, MD  guaiFENesin (MUCINEX) 600 MG 12 hr tablet Take 600 mg by mouth 2 (two) times daily as needed  for cough or to loosen phlegm.     [provider]  KLOR-CON M20 20 MEQ tablet TAKE 1 TABLET (20 MEQ TOTAL) BY MOUTH DAILY. 10/19/16   Tower, Wynelle Fanny, MD  levothyroxine (SYNTHROID, LEVOTHROID) 25 MCG tablet Take 1 tablet (25 mcg total) by mouth daily. 06/16/16   Tower, Wynelle Fanny, MD  losartan (COZAAR) 100 MG tablet TAKE 1 TABLET (100 MG TOTAL) BY MOUTH DAILY. 06/16/16   Tower, Wynelle Fanny, MD  metoprolol (LOPRESSOR) 50 MG tablet TAKE 1 TABLET BY MOUTH TWICE A DAY 12/02/16   Tower, Wynelle Fanny, MD    REVIEW OF SYSTEMS:  Review of Systems  Constitutional: Negative for chills, fever, malaise/fatigue and weight loss.  HENT: Negative for ear pain, hearing loss and tinnitus.   Eyes: Negative for blurred vision, double vision, pain and redness.  Respiratory: Positive for shortness of breath. Negative for cough and hemoptysis.   Cardiovascular: Positive for leg swelling. Negative for chest pain, palpitations and orthopnea.  Gastrointestinal: Negative for abdominal pain, constipation, diarrhea, nausea and vomiting.  Genitourinary: Negative for dysuria, frequency and hematuria.  Musculoskeletal: Negative for back pain, joint pain and neck pain.  Skin:       No acne, rash, or lesions  Neurological: Negative for dizziness, tremors, focal weakness and weakness.  Endo/Heme/Allergies: Negative for polydipsia. Does not bruise/bleed easily.  Psychiatric/Behavioral: Negative for depression. The patient is not nervous/anxious and does not have insomnia.      VITAL SIGNS:   Vitals:   02/26/17 1736 02/26/17 1737 02/26/17 2031 02/26/17 2126  BP:  (!) 161/94 (!) 136/112   Pulse:  93 93 91  Resp:  16 15 10   Temp:  98.3 F (36.8 C)    TempSrc:  Oral    SpO2:  94% 98% 100%  Weight: 82.6 kg (182 lb)     Height: 5\' 3"  (1.6 m)      Wt Readings from Last 3 Encounters:  02/26/17 82.6 kg (182 lb)  12/07/16 83.2 kg (183 lb 8 oz)  11/29/16 81.6 kg (180 lb)    PHYSICAL EXAMINATION:  Physical Exam  Vitals  reviewed. Constitutional: She is oriented to person, place, and time. She appears well-developed and well-nourished. No distress.  HENT:  Head: Normocephalic and atraumatic.  Mouth/Throat: Oropharynx is clear and moist.  Eyes: Pupils are equal, round, and reactive to light. Conjunctivae and EOM are normal. No scleral icterus.  Neck: Normal range of motion. Neck supple. No JVD present. No thyromegaly present.  Cardiovascular: Normal rate, regular rhythm and intact distal pulses.  Exam reveals no gallop and no friction rub.   No murmur heard. Respiratory: Effort normal. No respiratory distress. She has no wheezes. She has rales.  GI: Soft. Bowel sounds are normal. She exhibits no distension. There is  no tenderness.  Musculoskeletal: Normal range of motion. She exhibits edema.  No arthritis, no gout  Lymphadenopathy:    She has no cervical adenopathy.  Neurological: She is alert and oriented to person, place, and time. No cranial nerve deficit.  No dysarthria, no aphasia  Skin: Skin is warm and dry. No rash noted. No erythema.  Psychiatric: She has a normal mood and affect. Her behavior is normal. Judgment and thought content normal.    LABORATORY PANEL:   CBC  Recent Labs Lab 02/26/17 1743  WBC 6.1  HGB 10.4*  HCT 30.8*  PLT 278   ------------------------------------------------------------------------------------------------------------------  Chemistries   Recent Labs Lab 02/26/17 1743  NA 129*  K 3.4*  CL 95*  CO2 24  GLUCOSE 126*  BUN 6  CREATININE 0.70  CALCIUM 8.7*  AST 19  ALT 10*  ALKPHOS 100  BILITOT 0.4   ------------------------------------------------------------------------------------------------------------------  Cardiac Enzymes  Recent Labs Lab 02/26/17 1743  TROPONINI <0.03   ------------------------------------------------------------------------------------------------------------------  RADIOLOGY:  Dg Chest 2 View  Result Date:  02/26/2017 CLINICAL DATA:  Sinus congestion shortness of breath EXAM: CHEST  2 VIEW COMPARISON:  04/27/2014, ultrasound 08/04/2012 FINDINGS: Low lung volumes with chronic elevation and lobulation of right diaphragm. Hyperinflation. No focal consolidation or pleural effusion is seen. Coarse diffuse interstitial opacity. Borderline cardiomegaly with atherosclerosis. Tracheal deviation to the right, likely corresponding to ultrasound demonstrated left thyroid mass. No pneumothorax. Moderate lower thoracic compression, about 40% loss of height anteriorly, progressed since prior radiograph. IMPRESSION: 1. Diffuse coarse interstitial pulmonary opacity could relate to bronchial inflammation. No focal infiltrate is seen 2. Borderline cardiomegaly 3. Progressed compression fracture of the lower thoracic spine. Electronically Signed   By: Donavan Foil M.D.   On: 02/26/2017 18:16    EKG:   Orders placed or performed during the hospital encounter of 02/26/17  . ED EKG within 10 minutes  . ED EKG within 10 minutes  . ED EKG  . ED EKG    IMPRESSION AND PLAN:  Principal Problem:   Acute on chronic diastolic CHF (congestive heart failure) (HCC) - IV Lasix given in the ED, we'll give another dose, get an echocardiogram and cardiology consult Active Problems:   Essential hypertension - continue home meds   Coronary atherosclerosis - home medications   Hypothyroidism - home dose thyroid replacement   Hyperlipidemia - home meds   GERD - home dose PPI  All the records are reviewed and case discussed with ED provider. Management plans discussed with the patient and/or family.  DVT PROPHYLAXIS: SubQ lovenox  GI PROPHYLAXIS: PPI  ADMISSION STATUS: Observation  CODE STATUS: DNR Code Status History    Date Active Date Inactive Code Status Order ID Comments User Context   01/31/2014  8:11 PM 02/02/2014 12:50 PM DNR [id number]  Annita Brod, MD Inpatient    Questions for Most Recent Historical Code Status  (Order [id number])    Question Answer Comment   Maintain current active treatments Yes    Do not initiate new interventions No       TOTAL TIME TAKING CARE OF THIS PATIENT: 40 minutes.   Almando Brawley [place] 02/27/2017, 12:07 AM  [facility]  Office  [phone]  CC: Primary care physician; Tower, Wynelle Fanny, MD  Note:  This document was prepared using Systems analyst and may include unintentional dictation errors.

## 2017-02-27 NOTE — ED Notes (Signed)
Pharm called for meds, will mix for pt

## 2017-02-27 NOTE — Progress Notes (Signed)
Ambulated Kimberly [name] around the nursing station.  No dizziness.  Stable gait.  Did get tired toward the end of the circles.  O2 Sats dropped into the 80 for most of the walk.  Lowest sat was 84%.  Dr. Darvin Neighbours was informed.  He thinks she should stay one more night for further diuresis.

## 2017-02-27 NOTE — Progress Notes (Signed)
*  PRELIMINARY RESULTS* Echocardiogram 2D Echocardiogram has been performed.  Kimberly [name] 02/27/2017, 10:26 AM

## 2017-02-27 NOTE — Consult Note (Signed)
Kimberly [name] is a 83 y.o. female  [id number]  Primary Cardiologist: Neoma Laming Reason for Consultation: Shortness of breath  HPI: This is a 83 year old pleasant white female who presented to the emergency room with shortness of breath orthopnea PND and leg swelling. Patient had a chest x-ray in emergency room which showed congestive heart failure and was admitted to the hospital.   Review of Systems: No chest pain no tightness in the chest   Past Medical History:  Diagnosis Date  . Allergy    allergic rhinitis  . CAD (coronary artery disease)   . CHF (congestive heart failure) ([place])   . GERD (gastroesophageal reflux disease)   . Hyperlipidemia   . Hypertension   . Hypothyroid   . Insomnia     Medications Prior to Admission  Medication Sig Dispense Refill  . aspirin 81 MG tablet Take 81 mg by mouth daily.      . cetirizine (ZYRTEC) 10 MG tablet Take 10 mg by mouth daily as needed for allergies.     . Multiple Vitamin (MULTIVITAMIN WITH MINERALS) TABS tablet Take 1 tablet by mouth daily.    Marland Kitchen zolpidem (AMBIEN) 10 MG tablet TAKE 1/2 TO 1 TABLET BY MOUTH DAILY AT BEDTIME AS NEEDED 30 tablet 3  . amLODipine (NORVASC) 10 MG tablet TAKE 1 TABLET (10 MG TOTAL) BY MOUTH AT BEDTIME. 90 tablet 3  . docusate sodium 100 MG CAPS Take 100 mg by mouth 2 (two) times daily. For constipation 60 capsule 3  . esomeprazole (NEXIUM) 40 MG capsule TAKE 1 CAPSULE (40 MG TOTAL) BY MOUTH DAILY. 90 capsule 2  . fluticasone (FLONASE) 50 MCG/ACT nasal spray Place 2 sprays into both nostrils daily. 48 g 3  . furosemide (LASIX) 20 MG tablet Take 1 tablet (20 mg total) by mouth 2 (two) times daily as needed. 180 tablet 3  . guaiFENesin (MUCINEX) 600 MG 12 hr tablet Take 600 mg by mouth 2 (two) times daily as needed for cough or to loosen phlegm.     Marland Kitchen KLOR-CON M20 20 MEQ tablet TAKE 1 TABLET (20 MEQ TOTAL) BY MOUTH DAILY. 90 tablet 1  . levothyroxine (SYNTHROID, LEVOTHROID) 25 MCG tablet Take 1 tablet (25  mcg total) by mouth daily. 90 tablet 3  . losartan (COZAAR) 100 MG tablet TAKE 1 TABLET (100 MG TOTAL) BY MOUTH DAILY. 90 tablet 3  . metoprolol (LOPRESSOR) 50 MG tablet TAKE 1 TABLET BY MOUTH TWICE A DAY 180 tablet 1     . aspirin  81 mg Oral Daily  . carvedilol  12.5 mg Oral BID WC  . enoxaparin (LOVENOX) injection  40 mg Subcutaneous Q24H  . levothyroxine  25 mcg Oral QAC breakfast  . pantoprazole  40 mg Oral Daily  . sacubitril-valsartan  1 tablet Oral BID  . spironolactone  25 mg Oral Daily    Infusions:   Allergies  Allergen Reactions  . Ciprofloxacin Other (See Comments)    REACTION: burning of mouth and skin  . Lisinopril Cough  . Remeron [Mirtazapine] Other (See Comments)    constipation  . Sulfonamide Derivatives Other (See Comments)    REACTION: malaise  . Tetanus Toxoid Swelling  . Vesicare [Solifenacin Succinate] Other (See Comments)    Blurred vision and weakness  . Zoloft [Sertraline Hcl] Nausea And Vomiting  . Cephalexin Rash and Other (See Comments)    REACTION: rash and burning sensation to skin on arms and legs with itching.  Madelin Headings [Amitriptyline] Palpitations  .  Penicillins Rash and Other (See Comments)    Has patient had a PCN reaction causing immediate rash, facial/tongue/throat swelling, SOB or lightheadedness with hypotension: Unknown Has patient had a PCN reaction causing severe rash involving mucus membranes or skin necrosis: Unknown Has patient had a PCN reaction that required hospitalization: Unknown Has patient had a PCN reaction occurring within the last 10 years: No If all of the above answers are "NO", then may proceed with Cephalosporin use. Has since take Augmentin with no problem    Social History   Social History  . Marital status: Widowed    Spouse name: N/A  . Number of children: N/A  . Years of education: N/A   Occupational History  . Not on file.   Social History Main Topics  . Smoking status: Never Smoker  . Smokeless  tobacco: Never Used  . Alcohol use No  . Drug use: No  . Sexual activity: No   Other Topics Concern  . Not on file   Social History Narrative  . No narrative on file    Family History  Problem Relation Age of Onset  . Heart disease Mother   . Hypertension Mother     PHYSICAL EXAM: Vitals:   02/27/17 0356 02/27/17 0925  BP: 138/72 (!) 150/68  Pulse: 77 79  Resp: 17 (!) 22  Temp: 98.3 F (36.8 C)      Intake/Output Summary (Last 24 hours) at 02/27/17 1059 Last data filed at 02/27/17 0900  Gross per 24 hour  Intake              350 ml  Output             2100 ml  Net            -1750 ml    General:  Well appearing. No respiratory difficulty HEENT: normal Neck: supple. no JVD. Carotids 2+ bilat; no bruits. No lymphadenopathy or thryomegaly appreciated. Cor: PMI nondisplaced. Regular rate & rhythm. No rubs, gallops or murmurs. Lungs: clear Abdomen: soft, nontender, nondistended. No hepatosplenomegaly. No bruits or masses. Good bowel sounds. Extremities: no cyanosis, clubbing, rash, edema Neuro: alert & oriented x 3, cranial nerves grossly intact. moves all 4 extremities w/o difficulty. Affect pleasant.  ECG: Sinus rhythm with first-degree AV block and frequent PVCs with left bundle branch block  Results for orders placed or performed during the hospital encounter of 02/26/17 (from the past 24 hour(s))  Basic metabolic panel     Status: Abnormal   Collection Time: 02/26/17  5:43 PM  Result Value Ref Range   Sodium 129 (L) 135 - 145 mmol/L   Potassium 3.4 (L) 3.5 - 5.1 mmol/L   Chloride 95 (L) 101 - 111 mmol/L   CO2 24 22 - 32 mmol/L   Glucose, Bld 126 (H) 65 - 99 mg/dL   BUN 6 6 - 20 mg/dL   Creatinine, Ser 0.70 0.44 - 1.00 mg/dL   Calcium 8.7 (L) 8.9 - 10.3 mg/dL   GFR calc non Af Amer >60 >60 mL/min   GFR calc Af Amer >60 >60 mL/min   Anion gap 10 5 - 15  CBC     Status: Abnormal   Collection Time: 02/26/17  5:43 PM  Result Value Ref Range   WBC 6.1 3.6 -  11.0 K/uL   RBC 3.57 (L) 3.80 - 5.20 MIL/uL   Hemoglobin 10.4 (L) 12.0 - 16.0 g/dL   HCT 30.8 (L) 35.0 - 47.0 %   MCV  86.1 80.0 - 100.0 fL   MCH 29.0 26.0 - 34.0 pg   MCHC 33.6 32.0 - 36.0 g/dL   RDW 15.5 (H) 11.5 - 14.5 %   Platelets 278 150 - 440 K/uL  Troponin I     Status: None   Collection Time: 02/26/17  5:43 PM  Result Value Ref Range   Troponin I <0.03 <0.03 ng/mL  Brain natriuretic peptide     Status: Abnormal   Collection Time: 02/26/17  5:43 PM  Result Value Ref Range   B Natriuretic Peptide 1,298.0 (H) 0.0 - 100.0 pg/mL  Hepatic function panel     Status: Abnormal   Collection Time: 02/26/17  5:43 PM  Result Value Ref Range   Total Protein 7.3 6.5 - 8.1 g/dL   Albumin 3.9 3.5 - 5.0 g/dL   AST 19 15 - 41 U/L   ALT 10 (L) 14 - 54 U/L   Alkaline Phosphatase 100 38 - 126 U/L   Total Bilirubin 0.4 0.3 - 1.2 mg/dL   Bilirubin, Direct <0.1 (L) 0.1 - 0.5 mg/dL   Indirect Bilirubin NOT CALCULATED 0.3 - 0.9 mg/dL  Differential     Status: None   Collection Time: 02/26/17  5:43 PM  Result Value Ref Range   Neutrophils Relative % 70 %   Neutro Abs 4.5 1.4 - 6.5 K/uL   Lymphocytes Relative 16 %   Lymphs Abs 1.0 1.0 - 3.6 K/uL   Monocytes Relative 12 %   Monocytes Absolute 0.8 0.2 - 0.9 K/uL   Eosinophils Relative 1 %   Eosinophils Absolute 0.1 0 - 0.7 K/uL   Basophils Relative 1 %   Basophils Absolute 0.0 0 - 0.1 K/uL  Lactic acid, plasma     Status: None   Collection Time: 02/26/17  9:19 PM  Result Value Ref Range   Lactic Acid, Venous 1.2 0.5 - 1.9 mmol/L  Basic metabolic panel     Status: Abnormal   Collection Time: 02/27/17  5:37 AM  Result Value Ref Range   Sodium 133 (L) 135 - 145 mmol/L   Potassium 3.5 3.5 - 5.1 mmol/L   Chloride 95 (L) 101 - 111 mmol/L   CO2 27 22 - 32 mmol/L   Glucose, Bld 168 (H) 65 - 99 mg/dL   BUN 7 6 - 20 mg/dL   Creatinine, Ser 0.86 0.44 - 1.00 mg/dL   Calcium 9.0 8.9 - 10.3 mg/dL   GFR calc non Af Amer 59 (L) >60 mL/min   GFR  calc Af Amer >60 >60 mL/min   Anion gap 11 5 - 15  CBC     Status: Abnormal   Collection Time: 02/27/17  5:37 AM  Result Value Ref Range   WBC 5.3 3.6 - 11.0 K/uL   RBC 3.62 (L) 3.80 - 5.20 MIL/uL   Hemoglobin 10.6 (L) 12.0 - 16.0 g/dL   HCT 31.4 (L) 35.0 - 47.0 %   MCV 86.6 80.0 - 100.0 fL   MCH 29.4 26.0 - 34.0 pg   MCHC 33.9 32.0 - 36.0 g/dL   RDW 15.4 (H) 11.5 - 14.5 %   Platelets 279 150 - 440 K/uL   Dg Chest 2 View  Result Date: 02/26/2017 CLINICAL DATA:  Sinus congestion shortness of breath EXAM: CHEST  2 VIEW COMPARISON:  04/27/2014, ultrasound 08/04/2012 FINDINGS: Low lung volumes with chronic elevation and lobulation of right diaphragm. Hyperinflation. No focal consolidation or pleural effusion is seen. Coarse diffuse interstitial opacity. Borderline cardiomegaly with  atherosclerosis. Tracheal deviation to the right, likely corresponding to ultrasound demonstrated left thyroid mass. No pneumothorax. Moderate lower thoracic compression, about 40% loss of height anteriorly, progressed since prior radiograph. IMPRESSION: 1. Diffuse coarse interstitial pulmonary opacity could relate to bronchial inflammation. No focal infiltrate is seen 2. Borderline cardiomegaly 3. Progressed compression fracture of the lower thoracic spine. Electronically Signed   By: Donavan Foil M.D.   On: 02/26/2017 18:16     ASSESSMENT AND PLAN: Patient was admitted with congestive heart failure and echocardiogram shows left ventricle ejection fraction 33-35% with diffuse hypokinesis along with chest x-ray showing pulmonary congestion. Patient had normal left ventricular systolic function on echocardiogram a few years ago and thus has severe LV dysfunction now. Patient is a DO NOT RESUSCITATE but will start the patient on medications for severe LV dysfunction including coreg,entresto and Aldactone. Will discontinue amlodipine losartan and by mouth Lasix. Advise IV Lasix and will consider discharge tomorrow morning.  Advise follow-up in the office. Elchonon Maxson A

## 2017-02-28 LAB — GLUCOSE, CAPILLARY: GLUCOSE-CAPILLARY: 133 mg/dL — AB (ref 65–99)

## 2017-02-28 MED ORDER — POTASSIUM CHLORIDE CRYS ER 20 MEQ PO TBCR
40.0000 meq | EXTENDED_RELEASE_TABLET | Freq: Once | ORAL | Status: AC
  Administered 2017-02-28: 40 meq via ORAL
  Filled 2017-02-28: qty 2

## 2017-02-28 MED ORDER — SODIUM CHLORIDE 0.9% FLUSH
3.0000 mL | Freq: Two times a day (BID) | INTRAVENOUS | Status: DC
  Administered 2017-02-28 – 2017-03-03 (×5): 3 mL via INTRAVENOUS

## 2017-02-28 NOTE — Progress Notes (Signed)
SUBJECTIVE: Patient is less short of breath   Vitals:   02/28/17 0401 02/28/17 0741 02/28/17 0744 02/28/17 1240  BP: (!) 143/53 (!) 153/53  (!) 144/59  Pulse: 68 71 66 74  Resp: 16 16  18   Temp: 98.2 F (36.8 C) 98.2 F (36.8 C)  97.8 F (36.6 C)  TempSrc: Oral Oral  Oral  SpO2: 92% (!) 84% 93% 92%  Weight:      Height:        Intake/Output Summary (Last 24 hours) at 02/28/17 1301 Last data filed at 02/28/17 0900  Gross per 24 hour  Intake              600 ml  Output             1000 ml  Net             -400 ml    LABS: Basic Metabolic Panel:  Recent Labs  02/26/17 1743 02/27/17 0537  NA 129* 133*  K 3.4* 3.5  CL 95* 95*  CO2 24 27  GLUCOSE 126* 168*  BUN 6 7  CREATININE 0.70 0.86  CALCIUM 8.7* 9.0   Liver Function Tests:  Recent Labs  02/26/17 1743  AST 19  ALT 10*  ALKPHOS 100  BILITOT 0.4  PROT 7.3  ALBUMIN 3.9   No results for input(s): LIPASE, AMYLASE in the last 72 hours. CBC:  Recent Labs  02/26/17 1743 02/27/17 0537  WBC 6.1 5.3  NEUTROABS 4.5  --   HGB 10.4* 10.6*  HCT 30.8* 31.4*  MCV 86.1 86.6  PLT 278 279   Cardiac Enzymes:  Recent Labs  02/26/17 1743  TROPONINI <0.03   BNP: Invalid input(s): POCBNP D-Dimer: No results for input(s): DDIMER in the last 72 hours. Hemoglobin A1C: No results for input(s): HGBA1C in the last 72 hours. Fasting Lipid Panel: No results for input(s): CHOL, HDL, LDLCALC, TRIG, CHOLHDL, LDLDIRECT in the last 72 hours. Thyroid Function Tests: No results for input(s): TSH, T4TOTAL, T3FREE, THYROIDAB in the last 72 hours.  Invalid input(s): FREET3 Anemia Panel: No results for input(s): VITAMINB12, FOLATE, FERRITIN, TIBC, IRON, RETICCTPCT in the last 72 hours.   PHYSICAL EXAM General: Well developed, well nourished, in no acute distress HEENT:  Normocephalic and atramatic Neck:  No JVD.  Lungs: Clear bilaterally to auscultation and percussion. Heart: HRRR . Normal S1 and S2 without gallops  or murmurs.  Abdomen: Bowel sounds are positive, abdomen soft and non-tender  Msk:  Back normal, normal gait. Normal strength and tone for age. Extremities: No clubbing, cyanosis or edema.   Neuro: Alert and oriented X 3. Psych:  Good affect, responds appropriately  TELEMETRY:Sinus rhythm  ASSESSMENT AND PLAN: Congestive heart failure due to systolic dysfunction currently on excellent medications and improving significantly with Lasix IV. Can be discharged with follow-up in the office Thursday at 11 AM.  Principal Problem:   Acute on chronic diastolic CHF (congestive heart failure) (HCC) Active Problems:   Hypothyroidism   Hyperlipidemia   Essential hypertension   Coronary atherosclerosis   GERD    KHAN,SHAUKAT A, MD, [facility] 02/28/2017 1:01 PM

## 2017-02-28 NOTE — Progress Notes (Signed)
O2 sats in the 80's with am VS check.  Started 2L [place].  Sats increased to   93 . Night shift reported that during the night her sats dropped while sleeping.

## 2017-02-28 NOTE — Progress Notes (Signed)
Patient placed in bathroom by NT.  Patient voided and requested to remain in bathroom for BM.  Reports "feels like I'm gonna pass out".  Pt on commode,  alert and oriented, skin warm, dry on nurse arrival to room at 2225.  Pt able to stand and agrees to return to bed with nurse and NT assistance.  Pt able to ambulate towards bed but knees buckled before arrival to bed.  Pt assisted to floor at bedside. Pt placed back in bed with assistance of 3 staff members.  Daughter at bedside.  Pt cool, clammy on return to bed.  VS stable on return. Neuros intact. Continues to move all extremities and follows commands.  No skin breakdown noted. FSBS normal. Nurse phoned CCMD to question if any recent changes noted on telemetry during episode.  Loetta Rough reports patient had PVCS as previous but no recent changes.  Nurse later reviewed telemetry alarms and run of bigeminy PVCs noted beginning at 2224.  Pt reports feeling better on return to bed.  Pt and daughter [name] agree to plan to have patient remain in bed this shift.  Dr Jannifer Franklin and Rojelio Brenner Administrative Coordinator made aware of assisted fall.

## 2017-02-28 NOTE — Progress Notes (Signed)
[facility] [place] NAME: Kimberly [name]    MR#:  [id number]  DATE OF BIRTH:  1928-04-20  SUBJECTIVE:  CHIEF COMPLAINT:   Chief Complaint  Patient presents with  . Shortness of Breath   Continues to have orthopnea. Desatted during the night. Still feels short of breath. Lower extremity edema is improved.  REVIEW OF SYSTEMS:    Review of Systems  Constitutional: Positive for malaise/fatigue. Negative for chills and fever.  HENT: Negative for sore throat.   Eyes: Negative for blurred vision, double vision and pain.  Respiratory: Positive for cough and shortness of breath. Negative for hemoptysis and wheezing.   Cardiovascular: Positive for orthopnea and leg swelling. Negative for chest pain and palpitations.  Gastrointestinal: Negative for abdominal pain, constipation, diarrhea, heartburn, nausea and vomiting.  Genitourinary: Negative for dysuria and hematuria.  Musculoskeletal: Negative for back pain and joint pain.  Skin: Negative for rash.  Neurological: Positive for weakness. Negative for sensory change, speech change, focal weakness and headaches.  Endo/Heme/Allergies: Does not bruise/bleed easily.  Psychiatric/Behavioral: Negative for depression. The patient is not nervous/anxious.     DRUG ALLERGIES:   Allergies  Allergen Reactions  . Ciprofloxacin Other (See Comments)    REACTION: burning of mouth and skin  . Lisinopril Cough  . Remeron [Mirtazapine] Other (See Comments)    constipation  . Sulfonamide Derivatives Other (See Comments)    REACTION: malaise  . Tetanus Toxoid Swelling  . Vesicare [Solifenacin Succinate] Other (See Comments)    Blurred vision and weakness  . Zoloft [Sertraline Hcl] Nausea And Vomiting  . Cephalexin Rash and Other (See Comments)    REACTION: rash and burning sensation to skin on arms and legs with itching.  Madelin Headings [Amitriptyline] Palpitations  . Penicillins Rash and Other (See Comments)    Has  patient had a PCN reaction causing immediate rash, facial/tongue/throat swelling, SOB or lightheadedness with hypotension: Unknown Has patient had a PCN reaction causing severe rash involving mucus membranes or skin necrosis: Unknown Has patient had a PCN reaction that required hospitalization: Unknown Has patient had a PCN reaction occurring within the last 10 years: No If all of the above answers are "NO", then may proceed with Cephalosporin use. Has since take Augmentin with no problem    VITALS:  Blood pressure (!) 153/53, pulse 66, temperature 98.2 F (36.8 C), temperature source Oral, resp. rate 16, height 5\' 3"  (1.6 m), weight 78.3 kg (172 lb 11.2 oz), last menstrual period 08/03/1978, SpO2 93 %.  PHYSICAL EXAMINATION:   Physical Exam  GENERAL:  83 y.o.-year-old patient lying in the bed with no acute distress.  EYES: Pupils equal, round, reactive to light and accommodation. No scleral icterus. Extraocular muscles intact.  HEENT: Head atraumatic, normocephalic. Oropharynx and nasopharynx clear.  NECK:  Supple, no jugular venous distention. No thyroid enlargement, no tenderness.  LUNGS: Bilateral basal crackles. CARDIOVASCULAR: S1, S2 normal. No murmurs, rubs, or gallops.  ABDOMEN: Soft, nontender, nondistended. Bowel sounds present. No organomegaly or mass.  EXTREMITIES: No cyanosis, clubbing. Bilateral lower extremity edema NEUROLOGIC: Cranial nerves II through XII are intact. No focal Motor or sensory deficits b/l.   PSYCHIATRIC: The patient is alert and oriented x 3.  SKIN: No obvious rash, lesion, or ulcer.   LABORATORY PANEL:   CBC  Recent Labs Lab 02/27/17 0537  WBC 5.3  HGB 10.6*  HCT 31.4*  PLT 279   ------------------------------------------------------------------------------------------------------------------ Chemistries   Recent Labs Lab 02/26/17 1743 02/27/17  0537  NA 129* 133*  K 3.4* 3.5  CL 95* 95*  CO2 24 27  GLUCOSE 126* 168*  BUN 6 7   CREATININE 0.70 0.86  CALCIUM 8.7* 9.0  AST 19  --   ALT 10*  --   ALKPHOS 100  --   BILITOT 0.4  --    ------------------------------------------------------------------------------------------------------------------  Cardiac Enzymes  Recent Labs Lab 02/26/17 1743  TROPONINI <0.03   ------------------------------------------------------------------------------------------------------------------  RADIOLOGY:  Dg Chest 2 View  Result Date: 02/26/2017 CLINICAL DATA:  Sinus congestion shortness of breath EXAM: CHEST  2 VIEW COMPARISON:  04/27/2014, ultrasound 08/04/2012 FINDINGS: Low lung volumes with chronic elevation and lobulation of right diaphragm. Hyperinflation. No focal consolidation or pleural effusion is seen. Coarse diffuse interstitial opacity. Borderline cardiomegaly with atherosclerosis. Tracheal deviation to the right, likely corresponding to ultrasound demonstrated left thyroid mass. No pneumothorax. Moderate lower thoracic compression, about 40% loss of height anteriorly, progressed since prior radiograph. IMPRESSION: 1. Diffuse coarse interstitial pulmonary opacity could relate to bronchial inflammation. No focal infiltrate is seen 2. Borderline cardiomegaly 3. Progressed compression fracture of the lower thoracic spine. Electronically Signed   By: Donavan Foil M.D.   On: 02/26/2017 18:16     ASSESSMENT AND PLAN:   * Acute on chronic systolic CHF with ejection fraction of 35%. - IV Lasix, Beta blockers, Entresto - Input and Output - Counseled to limit fluids and Salt - Monitor Bun/Cr and Potassium - Echo -Appreciate cardiology input. Still has lower extremity edema, crackles and shortness of breath. We'll continue IV Lasix for 1 more day in the hospital. May need it for a longer duration. Reassess in the morning. She does have history of CAD. Will need outpatient workup due to change in ejection fraction from 55-35% over the last 2 years.  * Hypertension.  Medications changed to Coreg and Entresto  * DVT prophylaxis with Lovenox   All the records are reviewed and case discussed with Care Management/Social Workerr. Management plans discussed with the patient, family and they are in agreement.  CODE STATUS: DNR  DVT Prophylaxis: SCDs  TOTAL TIME TAKING CARE OF THIS PATIENT: 30 minutes.   POSSIBLE D/C IN 1-2 DAYS, DEPENDING ON CLINICAL CONDITION.  Hillary Bow R M.D on 02/28/2017 at 10:59 AM  Between 7am to 6pm - Pager - [phone]  After 6pm go to [email or web address] - password [email or web address] [facility]  Office  [phone]  CC: Primary care physician; Tower, Wynelle Fanny, MD  Note: This dictation was prepared with Dragon dictation along with smaller phrase technology. Any transcriptional errors that result from this process are unintentional.

## 2017-03-01 ENCOUNTER — Inpatient Hospital Stay: Payer: Medicare Other

## 2017-03-01 LAB — BASIC METABOLIC PANEL
Anion gap: 10 (ref 5–15)
BUN: 26 mg/dL — AB (ref 6–20)
CALCIUM: 8.2 mg/dL — AB (ref 8.9–10.3)
CO2: 31 mmol/L (ref 22–32)
Chloride: 92 mmol/L — ABNORMAL LOW (ref 101–111)
Creatinine, Ser: 1.29 mg/dL — ABNORMAL HIGH (ref 0.44–1.00)
GFR calc Af Amer: 42 mL/min — ABNORMAL LOW (ref >60)
GFR, EST NON AFRICAN AMERICAN: 36 mL/min — AB (ref >60)
GLUCOSE: 134 mg/dL — AB (ref 65–99)
Potassium: 3.9 mmol/L (ref 3.5–5.1)
SODIUM: 133 mmol/L — AB (ref 135–145)

## 2017-03-01 LAB — MAGNESIUM: MAGNESIUM: 1.5 mg/dL — AB (ref 1.7–2.4)

## 2017-03-01 MED ORDER — MAGNESIUM OXIDE 400 (241.3 MG) MG PO TABS
400.0000 mg | ORAL_TABLET | Freq: Once | ORAL | Status: AC
  Administered 2017-03-01: 400 mg via ORAL
  Filled 2017-03-01: qty 1

## 2017-03-01 MED ORDER — SPIRONOLACTONE 25 MG PO TABS: 25.0000 mg | ORAL_TABLET | Freq: Every day | ORAL | 0 refills | Status: DC

## 2017-03-01 MED ORDER — ENOXAPARIN SODIUM 30 MG/0.3ML ~~LOC~~ SOLN
30.0000 mg | SUBCUTANEOUS | Status: DC
  Administered 2017-03-01: 30 mg via SUBCUTANEOUS
  Filled 2017-03-01: qty 0.3

## 2017-03-01 MED ORDER — CARVEDILOL 3.125 MG PO TABS
3.1250 mg | ORAL_TABLET | Freq: Two times a day (BID) | ORAL | Status: DC
  Administered 2017-03-02 – 2017-03-03 (×3): 3.125 mg via ORAL
  Filled 2017-03-01 (×3): qty 1

## 2017-03-01 MED ORDER — MAGNESIUM OXIDE 400 (241.3 MG) MG PO TABS
800.0000 mg | ORAL_TABLET | Freq: Once | ORAL | Status: AC
  Administered 2017-03-01: 800 mg via ORAL
  Filled 2017-03-01: qty 2

## 2017-03-01 NOTE — Care Management (Signed)
CM screen due to age and diagnosis.  Spoke with patient and her daughter Kimberly [name] who is at bedside. Patient's current need for oxygen is acute.  Presented from home Lives alone but has supportive family members within walking distance.  Has not been seen at the [facility] but now has a pending appointment.  Current with PCP and no issues with accessing medical care, obtaining meds or with transportation.  has access to scales but does not weigh daily. Has the Living with Heart Failure Education Booklet.  Provided med Alert brochure, Agencies that provide in home continuous care and home helth agencies that provide skilled care. Discussed need for home 02 assessment during progression and with patient.  PT consult is pending.  Patient is agreeable to return home with home health but would not want to considerskilled nursing.  There "is probably a walker in the garage somewhere."

## 2017-03-01 NOTE — Plan of Care (Signed)
Problem: Activity: Goal: Risk for activity intolerance will decrease Outcome: Not Progressing No further syncopal events.  No complaints of dizziness.

## 2017-03-01 NOTE — Progress Notes (Signed)
Patient resting in the bed, alert and oriented, encouraged to drink water as per dr recommendation,

## 2017-03-01 NOTE — Progress Notes (Signed)
[place] at [name] NAME: Stacyann Mcconaughy    MR#:  [id number]  DATE OF BIRTH:  [birth year]  SUBJECTIVE:  CHIEF COMPLAINT:   Chief Complaint  Patient presents with  . Shortness of Breath   Had syncope overnight on standing up. Today her orthostatics are positive. Feels dizzy on sitting up.  Shortness of breath is better. Feels weak.  REVIEW OF SYSTEMS:    Review of Systems  Constitutional: Positive for malaise/fatigue. Negative for chills and fever.  HENT: Negative for sore throat.   Eyes: Negative for blurred vision, double vision and pain.  Respiratory: Positive for cough and shortness of breath. Negative for hemoptysis and wheezing.   Cardiovascular: Positive for orthopnea and leg swelling. Negative for chest pain and palpitations.  Gastrointestinal: Negative for abdominal pain, constipation, diarrhea, heartburn, nausea and vomiting.  Genitourinary: Negative for dysuria and hematuria.  Musculoskeletal: Negative for back pain and joint pain.  Skin: Negative for rash.  Neurological: Positive for weakness. Negative for sensory change, speech change, focal weakness and headaches.  Endo/Heme/Allergies: Does not bruise/bleed easily.  Psychiatric/Behavioral: Negative for depression. The patient is not nervous/anxious.     DRUG ALLERGIES:   Allergies  Allergen Reactions  . Ciprofloxacin Other (See Comments)    REACTION: burning of mouth and skin  . Lisinopril Cough  . Remeron [Mirtazapine] Other (See Comments)    constipation  . Sulfonamide Derivatives Other (See Comments)    REACTION: malaise  . Tetanus Toxoid Swelling  . Vesicare [Solifenacin Succinate] Other (See Comments)    Blurred vision and weakness  . Zoloft [Sertraline Hcl] Nausea And Vomiting  . Cephalexin Rash and Other (See Comments)    REACTION: rash and burning sensation to skin on arms and legs with itching.  Madelin Headings [Amitriptyline] Palpitations  . Penicillins Rash and  Other (See Comments)    Has patient had a PCN reaction causing immediate rash, facial/tongue/throat swelling, SOB or lightheadedness with hypotension: Unknown Has patient had a PCN reaction causing severe rash involving mucus membranes or skin necrosis: Unknown Has patient had a PCN reaction that required hospitalization: Unknown Has patient had a PCN reaction occurring within the last 10 years: No If all of the above answers are "NO", then may proceed with Cephalosporin use. Has since take Augmentin with no problem    VITALS:  Blood pressure (!) 103/54, pulse 90, temperature 97.7 F (36.5 C), resp. rate 18, height 5\' 3"  (1.6 m), weight 77.5 kg (170 lb 12.8 oz), last menstrual period 08/03/1978, SpO2 95 %.  PHYSICAL EXAMINATION:   Physical Exam  GENERAL:  83 y.o.-year-old patient lying in the bed with no acute distress.  EYES: Pupils equal, round, reactive to light and accommodation. No scleral icterus. Extraocular muscles intact.  HEENT: Head atraumatic, normocephalic. Oropharynx and nasopharynx clear.  NECK:  Supple, no jugular venous distention. No thyroid enlargement, no tenderness.  LUNGS: Crackles at right base. CARDIOVASCULAR: S1, S2 normal. No murmurs, rubs, or gallops.  ABDOMEN: Soft, nontender, nondistended. Bowel sounds present. No organomegaly or mass.  EXTREMITIES: No cyanosis, clubbing. Bilateral lower extremity edema NEUROLOGIC: Cranial nerves II through XII are intact. No focal Motor or sensory deficits b/l.   PSYCHIATRIC: The patient is alert and oriented x 3.  SKIN: No obvious rash, lesion, or ulcer.   LABORATORY PANEL:   CBC  Recent Labs Lab 02/27/17 0537  WBC 5.3  HGB 10.6*  HCT 31.4*  PLT 279   ------------------------------------------------------------------------------------------------------------------ Chemistries   Recent Labs  Lab 02/26/17 1743  03/01/17 0453  NA 129*  < > 133*  K 3.4*  < > 3.9  CL 95*  < > 92*  CO2 24  < > 31  GLUCOSE  126*  < > 134*  BUN 6  < > 26*  CREATININE 0.70  < > 1.29*  CALCIUM 8.7*  < > 8.2*  MG  --   --  1.5*  AST 19  --   --   ALT 10*  --   --   ALKPHOS 100  --   --   BILITOT 0.4  --   --   < > = values in this interval not displayed. ------------------------------------------------------------------------------------------------------------------  Cardiac Enzymes  Recent Labs Lab 02/26/17 1743  TROPONINI <0.03   ------------------------------------------------------------------------------------------------------------------  RADIOLOGY:  Dg Chest 2 View  Result Date: 03/01/2017 CLINICAL DATA:  Lower extremity edema.  Shortness of breath . EXAM: CHEST  2 VIEW COMPARISON:  02/26/2017. FINDINGS: Cardiomegaly. Pulmonary vascularity is normal. Low lung volumes. No focal infiltrate. No pleural effusion or pneumothorax. No acute bony abnormality . Further compression of lower thoracic vertebral body compression fracture. IMPRESSION: 1. Interval improvement of right base atelectasis. Heart size stable. No pulmonary venous congestion . 2. Further compression of lower thoracic vertebral body compression fracture . Electronically Signed   By: Marcello Moores  Register   On: 03/01/2017 10:46     ASSESSMENT AND PLAN:   * Acute on chronic systolic CHF with ejection fraction of 35%. - IV Lasix, Beta blockers, Entresto - Input and Output - Counseled to limit fluids and Salt - Bun/Cr and Potassium - Echo- EF 35%  We'll hold blood pressure medications today due to orthostatic hypotension. Repeat chest x-ray shows no pulmonary edema. Stop Lasix. Encouraged oral hydration. We'll consult physical therapy.  Discussed with her cardiologist Dr. Rockey Situ.  * Hypertension. Medications changed to Coreg and Entresto  * DVT prophylaxis with Lovenox   All the records are reviewed and case discussed with Care Management/Social Workerr. Management plans discussed with the patient, family and they are in  agreement.  CODE STATUS: DNR  DVT Prophylaxis: SCDs  TOTAL TIME TAKING CARE OF THIS PATIENT: 30 minutes.   POSSIBLE D/C IN 1-2 DAYS, DEPENDING ON CLINICAL CONDITION.  Hillary Bow R M.D on 03/01/2017 at 3:13 PM  Between 7am to 6pm - Pager - [phone]  After 6pm go to [email or web address] - password [email or web address] [facility]  Office  [phone]  CC: Primary care physician; Tower, Wynelle Fanny, MD  Note: This dictation was prepared with Dragon dictation along with smaller phrase technology. Any transcriptional errors that result from this process are unintentional.

## 2017-03-01 NOTE — Progress Notes (Signed)
SATURATION QUALIFICATIONS: (This note is used to comply with regulatory documentation for home oxygen)  Patient Saturations on Room Air at Rest = 93%  Patient Saturations on Room Air while Ambulating = 88%  Patient Saturations on 2 Liters of oxygen while Ambulating = 94%  Please briefly explain why patient needs home oxygen: 

## 2017-03-01 NOTE — Progress Notes (Signed)
SUBJECTIVE: Patient is feeling much better denies any chest pain or shortness of breath   Vitals:   02/28/17 1714 02/28/17 2005 02/28/17 2257 03/01/17 0427  BP: (!) 153/62 (!) 121/54 (!) 153/71 (!) 125/54  Pulse: 78 67  73  Resp:  18 18 16   Temp:  98.2 F (36.8 C) 98.5 F (36.9 C) 97.9 F (36.6 C)  TempSrc:  Oral  Oral  SpO2: 94% 92% 93% 91%  Weight:    170 lb 12.8 oz (77.5 kg)  Height:        Intake/Output Summary (Last 24 hours) at 03/01/17 0815 Last data filed at 03/01/17 0427  Gross per 24 hour  Intake              840 ml  Output              950 ml  Net             -110 ml    LABS: Basic Metabolic Panel:  Recent Labs  02/27/17 0537 03/01/17 0453  NA 133* 133*  K 3.5 3.9  CL 95* 92*  CO2 27 31  GLUCOSE 168* 134*  BUN 7 26*  CREATININE 0.86 1.29*  CALCIUM 9.0 8.2*  MG  --  1.5*   Liver Function Tests:  Recent Labs  02/26/17 1743  AST 19  ALT 10*  ALKPHOS 100  BILITOT 0.4  PROT 7.3  ALBUMIN 3.9   No results for input(s): LIPASE, AMYLASE in the last 72 hours. CBC:  Recent Labs  02/26/17 1743 02/27/17 0537  WBC 6.1 5.3  NEUTROABS 4.5  --   HGB 10.4* 10.6*  HCT 30.8* 31.4*  MCV 86.1 86.6  PLT 278 279   Cardiac Enzymes:  Recent Labs  02/26/17 1743  TROPONINI <0.03   BNP: Invalid input(s): POCBNP D-Dimer: No results for input(s): DDIMER in the last 72 hours. Hemoglobin A1C: No results for input(s): HGBA1C in the last 72 hours. Fasting Lipid Panel: No results for input(s): CHOL, HDL, LDLCALC, TRIG, CHOLHDL, LDLDIRECT in the last 72 hours. Thyroid Function Tests: No results for input(s): TSH, T4TOTAL, T3FREE, THYROIDAB in the last 72 hours.  Invalid input(s): FREET3 Anemia Panel: No results for input(s): VITAMINB12, FOLATE, FERRITIN, TIBC, IRON, RETICCTPCT in the last 72 hours.   PHYSICAL EXAM General: Well developed, well nourished, in no acute distress HEENT:  Normocephalic and atramatic Neck:  No JVD.  Lungs: Clear  bilaterally to auscultation and percussion. Heart: HRRR . Normal S1 and S2 without gallops or murmurs.  Abdomen: Bowel sounds are positive, abdomen soft and non-tender  Msk:  Back normal, normal gait. Normal strength and tone for age. Extremities: No clubbing, cyanosis or edema.   Neuro: Alert and oriented X 3. Psych:  Good affect, responds appropriately  TELEMETRY:Sinus rhythm  ASSESSMENT AND PLAN: Congestive heart failure due to severe LV dysfunction started the patient on entersto and doing very well. Patient can be discharged with follow-up in the office at 11 AM on Thursday.  Principal Problem:   Acute on chronic diastolic CHF (congestive heart failure) (HCC) Active Problems:   Hypothyroidism   Hyperlipidemia   Essential hypertension   Coronary atherosclerosis   GERD    Kolter Reaver A, MD, [facility] 03/01/2017 8:15 AM

## 2017-03-02 ENCOUNTER — Telehealth: Payer: Self-pay | Admitting: Cardiovascular Disease

## 2017-03-02 DIAGNOSIS — I493 Ventricular premature depolarization: Secondary | ICD-10-CM

## 2017-03-02 DIAGNOSIS — R0603 Acute respiratory distress: Secondary | ICD-10-CM | POA: Diagnosis present

## 2017-03-02 DIAGNOSIS — I5021 Acute systolic (congestive) heart failure: Secondary | ICD-10-CM | POA: Diagnosis present

## 2017-03-02 DIAGNOSIS — N179 Acute kidney failure, unspecified: Secondary | ICD-10-CM

## 2017-03-02 DIAGNOSIS — I951 Orthostatic hypotension: Secondary | ICD-10-CM

## 2017-03-02 LAB — BASIC METABOLIC PANEL
Anion gap: 11 (ref 5–15)
BUN: 27 mg/dL — AB (ref 6–20)
CALCIUM: 8 mg/dL — AB (ref 8.9–10.3)
CO2: 29 mmol/L (ref 22–32)
CREATININE: 1.12 mg/dL — AB (ref 0.44–1.00)
Chloride: 91 mmol/L — ABNORMAL LOW (ref 101–111)
GFR calc non Af Amer: 43 mL/min — ABNORMAL LOW (ref >60)
GFR, EST AFRICAN AMERICAN: 50 mL/min — AB (ref >60)
Glucose, Bld: 135 mg/dL — ABNORMAL HIGH (ref 65–99)
Potassium: 3.8 mmol/L (ref 3.5–5.1)
SODIUM: 131 mmol/L — AB (ref 135–145)

## 2017-03-02 LAB — CBC
HCT: 35 % (ref 35.0–47.0)
Hemoglobin: 11.9 g/dL — ABNORMAL LOW (ref 12.0–16.0)
MCH: 29.5 pg (ref 26.0–34.0)
MCHC: 34.1 g/dL (ref 32.0–36.0)
MCV: 86.4 fL (ref 80.0–100.0)
PLATELETS: 305 10*3/uL (ref 150–440)
RBC: 4.05 MIL/uL (ref 3.80–5.20)
RDW: 15.4 % — AB (ref 11.5–14.5)
WBC: 7.4 10*3/uL (ref 3.6–11.0)

## 2017-03-02 LAB — MAGNESIUM: MAGNESIUM: 1.7 mg/dL (ref 1.7–2.4)

## 2017-03-02 MED ORDER — ENOXAPARIN SODIUM 40 MG/0.4ML ~~LOC~~ SOLN
40.0000 mg | SUBCUTANEOUS | Status: DC
  Administered 2017-03-02: 40 mg via SUBCUTANEOUS
  Filled 2017-03-02: qty 0.4

## 2017-03-02 MED ORDER — LOSARTAN POTASSIUM 25 MG PO TABS
25.0000 mg | ORAL_TABLET | Freq: Every day | ORAL | Status: DC
  Administered 2017-03-02 – 2017-03-03 (×2): 25 mg via ORAL
  Filled 2017-03-02 (×2): qty 1

## 2017-03-02 NOTE — Progress Notes (Signed)
[facility] NAME: Kimberly [name]    MR#:  [id number]  DATE OF BIRTH:  [birth year]  SUBJECTIVE:  CHIEF COMPLAINT:   Chief Complaint  Patient presents with  . Shortness of Breath   Still feels a little weak and dizzy at times. Improved.  Continues to be on 2 L oxygen.  REVIEW OF SYSTEMS:    Review of Systems  Constitutional: Positive for malaise/fatigue. Negative for chills and fever.  HENT: Negative for sore throat.   Eyes: Negative for blurred vision, double vision and pain.  Respiratory: Positive for cough and shortness of breath. Negative for hemoptysis and wheezing.   Cardiovascular: Positive for orthopnea and leg swelling. Negative for chest pain and palpitations.  Gastrointestinal: Negative for abdominal pain, constipation, diarrhea, heartburn, nausea and vomiting.  Genitourinary: Negative for dysuria and hematuria.  Musculoskeletal: Negative for back pain and joint pain.  Skin: Negative for rash.  Neurological: Positive for weakness. Negative for sensory change, speech change, focal weakness and headaches.  Endo/Heme/Allergies: Does not bruise/bleed easily.  Psychiatric/Behavioral: Negative for depression. The patient is not nervous/anxious.     DRUG ALLERGIES:   Allergies  Allergen Reactions  . Ciprofloxacin Other (See Comments)    REACTION: burning of mouth and skin  . Lisinopril Cough  . Remeron [Mirtazapine] Other (See Comments)    constipation  . Sulfonamide Derivatives Other (See Comments)    REACTION: malaise  . Tetanus Toxoid Swelling  . Vesicare [Solifenacin Succinate] Other (See Comments)    Blurred vision and weakness  . Zoloft [Sertraline Hcl] Nausea And Vomiting  . Cephalexin Rash and Other (See Comments)    REACTION: rash and burning sensation to skin on arms and legs with itching.  Madelin Headings [Amitriptyline] Palpitations  . Penicillins Rash and Other (See Comments)    Has patient had a PCN reaction  causing immediate rash, facial/tongue/throat swelling, SOB or lightheadedness with hypotension: Unknown Has patient had a PCN reaction causing severe rash involving mucus membranes or skin necrosis: Unknown Has patient had a PCN reaction that required hospitalization: Unknown Has patient had a PCN reaction occurring within the last 10 years: No If all of the above answers are "NO", then may proceed with Cephalosporin use. Has since take Augmentin with no problem    VITALS:  Blood pressure 137/66, pulse 87, temperature 98.5 F (36.9 C), temperature source Oral, resp. rate 18, height 5\' 3"  (1.6 m), weight 76.9 kg (169 lb 8.5 oz), last menstrual period 08/03/1978, SpO2 95 %.  PHYSICAL EXAMINATION:   Physical Exam  GENERAL:  83 y.o.-year-old patient lying in the bed with no acute distress.  EYES: Pupils equal, round, reactive to light and accommodation. No scleral icterus. Extraocular muscles intact.  HEENT: Head atraumatic, normocephalic. Oropharynx and nasopharynx clear.  NECK:  Supple, no jugular venous distention. No thyroid enlargement, no tenderness.  LUNGS: Crackles at right base. CARDIOVASCULAR: S1, S2 normal. No murmurs, rubs, or gallops.  ABDOMEN: Soft, nontender, nondistended. Bowel sounds present. No organomegaly or mass.  EXTREMITIES: No cyanosis, clubbing. Bilateral lower extremity edema NEUROLOGIC: Cranial nerves II through XII are intact. No focal Motor or sensory deficits b/l.   PSYCHIATRIC: The patient is alert and oriented x 3.  SKIN: No obvious rash, lesion, or ulcer.   LABORATORY PANEL:   CBC  Recent Labs Lab 03/02/17 0828  WBC 7.4  HGB 11.9*  HCT 35.0  PLT 305   ------------------------------------------------------------------------------------------------------------------ Chemistries   Recent Labs Lab 02/26/17 1743  03/02/17 0828  NA 129*  < > 131*  K 3.4*  < > 3.8  CL 95*  < > 91*  CO2 24  < > 29  GLUCOSE 126*  < > 135*  BUN 6  < > 27*   CREATININE 0.70  < > 1.12*  CALCIUM 8.7*  < > 8.0*  MG  --   < > 1.7  AST 19  --   --   ALT 10*  --   --   ALKPHOS 100  --   --   BILITOT 0.4  --   --   < > = values in this interval not displayed. ------------------------------------------------------------------------------------------------------------------  Cardiac Enzymes  Recent Labs Lab 02/26/17 1743  TROPONINI <0.03   ------------------------------------------------------------------------------------------------------------------  RADIOLOGY:  Dg Chest 2 View  Result Date: 03/01/2017 CLINICAL DATA:  Lower extremity edema.  Shortness of breath . EXAM: CHEST  2 VIEW COMPARISON:  02/26/2017. FINDINGS: Cardiomegaly. Pulmonary vascularity is normal. Low lung volumes. No focal infiltrate. No pleural effusion or pneumothorax. No acute bony abnormality . Further compression of lower thoracic vertebral body compression fracture. IMPRESSION: 1. Interval improvement of right base atelectasis. Heart size stable. No pulmonary venous congestion . 2. Further compression of lower thoracic vertebral body compression fracture . Electronically Signed   By: Marcello Moores  Register   On: 03/01/2017 10:46     ASSESSMENT AND PLAN:   * Acute on chronic systolic CHF with ejection fraction of 35%. -Was on IV Lasix, coreg , Entresto. Hold Lasix as patient looked dehydrated and had orthostatic hypotension. Stop Entresto and restart losartan. - Input and Output - Counseled to limit fluids and Salt - Bun/Cr and Potassium - Echo- EF 35% Discussed with cardiology team. Cardiac catheterization tomorrow for evaluation of change of ejection fraction  * Hypertension. On Coreg and losartan  * DVT prophylaxis with Lovenox  All the records are reviewed and case discussed with Care Management/Social Workerr. Management plans discussed with the patient, family and they are in agreement.  CODE STATUS: DNR  DVT Prophylaxis: SCDs  TOTAL TIME TAKING CARE OF  THIS PATIENT: 30 minutes.   POSSIBLE D/C IN 1-2 DAYS, DEPENDING ON CLINICAL CONDITION.  Hillary Bow R M.D on 03/02/2017 at 10:48 AM  Between 7am to 6pm - Pager - [phone]  After 6pm go to [email or web address] - password [email or web address] [facility]  Office  [phone]  CC: Primary care physician; Tower, Wynelle Fanny, MD  Note: This dictation was prepared with Dragon dictation along with smaller phrase technology. Any transcriptional errors that result from this process are unintentional.

## 2017-03-02 NOTE — Progress Notes (Signed)
Anticoagulation monitoring(Lovenox):  83yo  F ordered Lovenox 30 mg Q24h  Filed Weights   02/28/17 0357 03/01/17 0427 03/02/17 0608  Weight: 172 lb 11.2 oz (78.3 kg) 170 lb 12.8 oz (77.5 kg) 169 lb 8.5 oz (76.9 kg)   BMI 30.1   Lab Results  Component Value Date   CREATININE 1.12 (H) 03/02/2017   CREATININE 1.29 (H) 03/01/2017   CREATININE 0.86 02/27/2017   Estimated Creatinine Clearance: 34.7 mL/min (A) (by C-G formula based on SCr of 1.12 mg/dL (H)). Hemoglobin & Hematocrit     Component Value Date/Time   HGB 11.9 (L) 03/02/2017 0828   HCT 35.0 03/02/2017 0828     Per Protocol for Patient with now estCrcl > 30 ml/min and BMI < 40, will transition to Lovenox 40 mg Q24h     Chinita Greenland PharmD Clinical Pharmacist 03/02/2017

## 2017-03-02 NOTE — Progress Notes (Signed)
Fluids encouraged. No signs or c/o pain, distress, SOB or acute distress noted. Pt. Slept well throughout the night.

## 2017-03-02 NOTE — Plan of Care (Signed)
Problem: Food- and Nutrition-Related Knowledge Deficit (NB-1.1) Goal: Nutrition education Formal process to instruct or train a patient/client in a skill or to impart knowledge to help patients/clients voluntarily manage or modify food choices and eating behavior to maintain or improve health. Outcome: Adequate for Discharge Nutrition Education Note  RD consulted for nutrition education regarding new onset CHF.  RD provided "Low Sodium Nutrition Therapy" handout from the Academy of [employer]. Reviewed patient's dietary recall. Provided examples on ways to decrease sodium intake in diet. Discouraged intake of processed foods and use of salt shaker. Encouraged fresh fruits and vegetables as well as whole grain sources of carbohydrates to maximize fiber intake.   RD discussed why it is important for patient to adhere to diet recommendations, and emphasized the role of fluids, foods to avoid, and importance of weighing self daily. Teach back method used.  Expect fair compliance.  Body mass index is 30.03 kg/m. Pt meets criteria for overweight based on current BMI.  Current diet order is heart healthy, patient is consuming approximately 90-100% of meals at this time. Labs and medications reviewed. No further nutrition interventions warranted at this time. RD contact information provided. If additional nutrition issues arise, please re-consult RD.   Satira Anis. Treon Kehl, MS, RD LDN Inpatient Clinical Dietitian Pager [phone]

## 2017-03-02 NOTE — Evaluation (Signed)
Physical Therapy Evaluation Patient Details Name: Kimberly [name] MRN: [id number] DOB: [birth year] Today's Date: 03/02/2017   History of Present Illness  83 y.o. female who presents with Progressive lower extremity edema and shortness of breath. Evaluation here in the ED is consistent with exacerbation of her heart failure, with a significantly elevated BNP and chest x-ray showing vascular congestion.  Clinical Impression  Pt has been in bed for a few days with only BSC transfers so feels somewhat weak/unsteady with PT, but ultimately was able to walk ~100 ft and negotiate up/down 2 steps w/o AD and with good overall safety.  She did have somewhat low BP, but orthostatic were; supine: 117/56, sitting: 105/56, standing: 113/98.  Pt with some headache/dizziness/fatigue with the effort of ambulation but ultimately did well.  She is not as confident, fast or steady as here baseline and would likely benefit from HHPT upon discharge.     Follow Up Recommendations Home health PT    Equipment Recommendations  None recommended by PT    Recommendations for Other Services       Precautions / Restrictions Precautions Precautions: Fall Restrictions Weight Bearing Restrictions: No      Mobility  Bed Mobility Overal bed mobility: Independent             General bed mobility comments: Pt easily gets to sitting EOB  Transfers Overall transfer level: Independent Equipment used: None             General transfer comment: Pt did well getting to standing with good confidence, minimal reliance on UEs and generally good safety  Ambulation/Gait Ambulation/Gait assistance: Min guard Ambulation Distance (Feet): 100 Feet Assistive device: None       General Gait Details: Pt with slow, hesitant gait; but had no LOBs or significant safety issues.  Pt on room air t/o the effort with O2 remaining in the low 90s.  She did have some occasional stagger steps with c/o dizziness but never needed direct  assist to maintain balance.  Overall pt did well and does not show need for AD.   Stairs            Wheelchair Mobility    Modified Rankin (Stroke Patients Only)       Balance                                             Pertinent Vitals/Pain Pain Assessment:  (reports minor headache/dizziness with activity)    Home Living Family/patient expects to be discharged to:: Private residence Living Arrangements: Alone Available Help at Discharge: Available PRN/intermittently   Home Access: Stairs to enter Entrance Stairs-Rails: None (typically holds door jam) Entrance Stairs-Number of Steps: 2 Home Layout: One level Home Equipment: Walker - 2 wheels      Prior Function Level of Independence: Independent         Comments: Pt does not drive, but is able to be out in the community multiple times a week and stays relatively active     Hand Dominance        Extremity/Trunk Assessment   Upper Extremity Assessment Upper Extremity Assessment: Overall WFL for tasks assessed;Generalized weakness    Lower Extremity Assessment Lower Extremity Assessment: Overall WFL for tasks assessed;Generalized weakness       Communication   Communication: No difficulties  Cognition Arousal/Alertness: Awake/alert Behavior During Therapy: WFL for tasks  assessed/performed Overall Cognitive Status: Within Functional Limits for tasks assessed                                        General Comments      Exercises     Assessment/Plan    PT Assessment Patient needs continued PT services  PT Problem List Decreased strength;Decreased activity tolerance;Decreased mobility;Decreased balance;Decreased safety awareness;Cardiopulmonary status limiting activity       PT Treatment Interventions Gait training;Stair training;Functional mobility training;Therapeutic activities;Therapeutic exercise;Balance training;Patient/family education    PT Goals  (Current goals can be found in the Care Plan section)  Acute Rehab PT Goals Patient Stated Goal: Get feeling well enough to go home PT Goal Formulation: With patient Time For Goal Achievement: 03/16/17 Potential to Achieve Goals: Good    Frequency Min 2X/week   Barriers to discharge        Co-evaluation               AM-PAC PT "6 Clicks" Daily Activity  Outcome Measure Difficulty turning over in bed (including adjusting bedclothes, sheets and blankets)?: None Difficulty moving from lying on back to sitting on the side of the bed? : None Difficulty sitting down on and standing up from a chair with arms (e.g., wheelchair, bedside commode, etc,.)?: None Help needed moving to and from a bed to chair (including a wheelchair)?: None Help needed walking in hospital room?: None Help needed climbing 3-5 steps with a railing? : A Little 6 Click Score: 23    End of Session Equipment Utilized During Treatment: Gait belt Activity Tolerance: Patient tolerated treatment well;Patient limited by fatigue Patient left: with chair alarm set;with call bell/phone within reach;with family/visitor present   PT Visit Diagnosis: Muscle weakness (generalized) (M62.81);Difficulty in walking, not elsewhere classified (R26.2)    Time: 1340-1408 PT Time Calculation (min) (ACUTE ONLY): 28 min   Charges:   PT Evaluation $PT Eval Low Complexity: 1 Low     PT G Codes:   PT G-Codes **NOT FOR INPATIENT CLASS** Functional Assessment Tool Used: AM-PAC 6 Clicks Basic Mobility Functional Limitation: Mobility: Walking and moving around Mobility: Walking and Moving Around Current Status (O3500): At least 1 percent but less than 20 percent impaired, limited or restricted Mobility: Walking and Moving Around Goal Status [phone]): 0 percent impaired, limited or restricted    Kreg Shropshire, DPT 03/02/2017, 2:25 PM

## 2017-03-02 NOTE — Care Management (Signed)
Patient and daughter have not made decision as to whether to accept home health services nor have they decided on agency

## 2017-03-02 NOTE — Telephone Encounter (Signed)
Received records request [employer] , forwarded to [facility] for processing.

## 2017-03-02 NOTE — Consult Note (Signed)
Cardiology Consultation Note  Patient ID: KRYSTYL CANNELL, MRN: [id number], DOB/AGE: 81/11/1927 81 y.o. Admit date: 02/26/2017   Date of Consult: 03/02/2017 Primary Physician: Tower, Wynelle Fanny, MD Primary Cardiologist: Dr. Rockey Situ, MD Requesting Physician: Dr. Darvin Neighbours, MD  Chief Complaint: SOB Reason for Consult: Acute systolic and acute on chronic diastolic CHF  HPI: CALYPSO HAGARTY is a 83 y.o. female who is being seen today for the evaluation of acute systolic and acute on chronic diastolic CHF at the request of Dr. Darvin Neighbours, MD. Patient has a h/o nonobstructive CAD by cardiac catheterization 2 in 2001 and 7564, chronic diastolic CHF, chronic chest pain, hypertension, hyperlipidemia, insomnia, GERD, obesity, and known left bundle-branch block who was admitted to [facility] on 7/28 with increased shortness of breath. Patient was initially followed by outside cardiologist though family requested her primary cardiologist evaluate her on 7/31. She was found to have acute systolic and acute on chronic diastolic CHF by echo this admission.   Most recent ischemic evaluation via stress testing in May 2011 was negative. Admitted to hospital in July 2015 with increased shortness of breath. Diagnosed with diastolic CHF. BNP at that time 1900. Cardiac enzymes negative. Echocardiogram at that time showed normal LV systolic function with diastolic dysfunction. She was successfully diuresed. She was last seen by Dr. Cherly Beach in November 2017 with a baseline weight typically 182-184 pounds. Weight at that time was 185.8 pounds. She had noted some increased lower extremity swelling felt to possibly related to prolonged periods of time sitting. Previously on HCTZ dose of this was discontinued due to low sodium. Patient presented to [facility] ED on 7/27 with increased shortness of breath that began that morning shortly after waking up. This was preceded by increased lower extremity swelling for 2-3 days. Never with chest pain. Chest x-ray showed  diffuse coarse interstitial pulmonary opacity possibly related to bronchial inflammation. BNP elevated at 1298. Troponin negative 1. Renal function 0.70 upon admission subsequently trending up to 1.29 on 7/30. Sodium low at 129 upon ED evaluation at as slowly improved to 133 on 7/30. Potassium has improved from 3.4-3.9. White blood cell count 6.1, hemoglobin 10.4, reticulocyte count 278 upon ED evaluation. Echocardiogram on 7/28 read by outside cardiologist showed newly reduced ejection fraction of 35%, diffuse hypokinesis, grade 2 diastolic dysfunction, mild aortic insufficiency, mild mitral regurgitation, PASP 36. Her medications were optimized with Coreg being started in place of Lopressor, Entresto being started in place of losartan, spironolactone was added, and amlodipine was discontinued. She was continued on IV diuresis with a documented urine output of 2.6 L for the admission and a recorded weight of 170 pounds on 7/30. On 7/30 she was noted to be orthostatic, having had syncopal episode with positional change. BP dropped to 79/59. Her carvedilol was held. She did continue to receive Entresto. With the continued increase in her renal function her Lasix was held as of 7/30. [facility] was asked to evaluate patient on 7/31, given she is established with [place].   She remain on supplemental oxygen at 2 L via nasal cannula. She does feel like her breathing is improved, though not back to baseline yet. Never with chest pain. Some lower extremity swelling. No abdominal distension, orthopnea, or PND. Mild, nonproductive cough. No labs this morning.    Past Medical History:  Diagnosis Date  . Allergy    allergic rhinitis  . CAD (coronary artery disease)   . CHF (congestive heart failure) ([place])   . GERD (gastroesophageal reflux disease)   .  Hyperlipidemia   . Hypertension   . Hypothyroid   . Insomnia       Most Recent Cardiac Studies: TTE 02/27/2017: Study Conclusions  - Left ventricle: The  cavity size was severely dilated. There was   mild concentric hypertrophy. Systolic function was moderately to   severely reduced. The estimated ejection fraction was 35%.   Diffuse hypokinesis. - Aortic valve: There was mild regurgitation. - Mitral valve: There was mild regurgitation. - Pulmonary arteries: PA peak pressure: 36 mm Hg (S).  Impressions:  - The right ventricular systolic pressure was increased consistent   with mild pulmonary hypertension. Severely dilated left atrium   and left ventricle with severe left ventricular systolic   dysfunction] LVEF 33-35%, with diffuse hypokinesis. Severe mitral   annular calcification with mild to moderate mitral regurgitation   and mild pulmonary hypertension. Fibrocalcific 5 aortic valve   without any significant aortic stenosis but mild aortic   regurgitation. There is grade 2 diastolic dysfunction.  TTE 01/2014: Study Conclusions  - Left ventricle: The cavity size was normal. There was mild focal basal and mild concentric hypertrophy of the septum. Systolic function was normal. The estimated ejection fraction was in the range of 50% to 55%. Wall motion was normal; there were no regional wall motion abnormalities. Doppler parameters are consistent with abnormal left ventricular relaxation (grade 1 diastolic dysfunction). - Aortic valve: There was trivial regurgitation. - Mitral valve: Calcified annulus. There was mild regurgitation. - Left atrium: The atrium was mildly dilated.   Surgical History:  Past Surgical History:  Procedure Laterality Date  . CARDIAC CATHETERIZATION     [place]    . THYROID SURGERY       Home Meds: Prior to Admission medications   Medication Sig Start Date End Date Taking? Authorizing Provider  amLODipine (NORVASC) 10 MG tablet TAKE 1 TABLET (10 MG TOTAL) BY MOUTH AT BEDTIME. 01/11/17  Yes Gollan, Kathlene November, MD  aspirin 81 MG tablet Take  81 mg by mouth daily.     Yes [provider]  cetirizine (ZYRTEC) 10 MG tablet Take 10 mg by mouth daily as needed for allergies.    Yes [provider]  esomeprazole (NEXIUM) 40 MG capsule TAKE 1 CAPSULE (40 MG TOTAL) BY MOUTH DAILY. 10/12/16  Yes Tower, Wynelle Fanny, MD  fluticasone (FLONASE) 50 MCG/ACT nasal spray Place 2 sprays into both nostrils daily. 10/13/13  Yes Tower, Wynelle Fanny, MD  furosemide (LASIX) 20 MG tablet Take 1 tablet (20 mg total) by mouth 2 (two) times daily as needed. 06/19/16  Yes Gollan, Kathlene November, MD  guaiFENesin (MUCINEX) 600 MG 12 hr tablet Take 600 mg by mouth 2 (two) times daily as needed for cough or to loosen phlegm.    Yes [provider]  KLOR-CON M20 20 MEQ tablet TAKE 1 TABLET (20 MEQ TOTAL) BY MOUTH DAILY. 10/19/16  Yes Tower, Wynelle Fanny, MD  levothyroxine (SYNTHROID, LEVOTHROID) 25 MCG tablet Take 1 tablet (25 mcg total) by mouth daily. 06/16/16  Yes Tower, Wynelle Fanny, MD  losartan (COZAAR) 100 MG tablet TAKE 1 TABLET (100 MG TOTAL) BY MOUTH DAILY. 06/16/16  Yes Tower, Wynelle Fanny, MD  metoprolol (LOPRESSOR) 50 MG tablet TAKE 1 TABLET BY MOUTH TWICE A DAY 12/02/16  Yes Tower, Wynelle Fanny, MD  Multiple Vitamin (MULTIVITAMIN WITH MINERALS) TABS tablet Take 1 tablet by mouth daily.   Yes [provider]  zolpidem Lorrin Mais)  10 MG tablet TAKE 1/2 TO 1 TABLET BY MOUTH DAILY AT BEDTIME AS NEEDED 02/18/17  Yes Tower, Wynelle Fanny, MD  carvedilol (COREG) 12.5 MG tablet Take 1 tablet (12.5 mg total) by mouth 2 (two) times daily with a meal. 02/27/17   Hillary Bow, MD  docusate sodium 100 MG CAPS Take 100 mg by mouth 2 (two) times daily. For constipation Patient not taking: Reported on 02/27/2017 02/02/14   Rai, Vernelle Emerald, MD  sacubitril-valsartan (ENTRESTO) 24-26 MG Take 1 tablet by mouth 2 (two) times daily. 02/27/17   Hillary Bow, MD  spironolactone (ALDACTONE) 25 MG tablet Take 1 tablet (25 mg total) by mouth daily. 03/01/17   Hillary Bow, MD    Inpatient  Medications:  . aspirin  81 mg Oral Daily  . carvedilol  3.125 mg Oral BID WC  . enoxaparin (LOVENOX) injection  30 mg Subcutaneous Q24H  . levothyroxine  25 mcg Oral QAC breakfast  . losartan  25 mg Oral Daily  . pantoprazole  40 mg Oral Daily  . sodium chloride flush  3 mL Intravenous Q12H     Allergies:  Allergies  Allergen Reactions  . Ciprofloxacin Other (See Comments)    REACTION: burning of mouth and skin  . Lisinopril Cough  . Remeron [Mirtazapine] Other (See Comments)    constipation  . Sulfonamide Derivatives Other (See Comments)    REACTION: malaise  . Tetanus Toxoid Swelling  . Vesicare [Solifenacin Succinate] Other (See Comments)    Blurred vision and weakness  . Zoloft [Sertraline Hcl] Nausea And Vomiting  . Cephalexin Rash and Other (See Comments)    REACTION: rash and burning sensation to skin on arms and legs with itching.  Madelin Headings [Amitriptyline] Palpitations  . Penicillins Rash and Other (See Comments)    Has patient had a PCN reaction causing immediate rash, facial/tongue/throat swelling, SOB or lightheadedness with hypotension: Unknown Has patient had a PCN reaction causing severe rash involving mucus membranes or skin necrosis: Unknown Has patient had a PCN reaction that required hospitalization: Unknown Has patient had a PCN reaction occurring within the last 10 years: No If all of the above answers are "NO", then may proceed with Cephalosporin use. Has since take Augmentin with no problem    Social History   Social History  . Marital status: Widowed    Spouse name: N/A  . Number of children: N/A  . Years of education: N/A   Occupational History  . Not on file.   Social History Main Topics  . Smoking status: Never Smoker  . Smokeless tobacco: Never Used  . Alcohol use No  . Drug use: No  . Sexual activity: No   Other Topics Concern  . Not on file   Social History Narrative  . No narrative on file     Family History  Problem  Relation Age of Onset  . Heart disease Mother   . Hypertension Mother      Review of Systems: Review of Systems  Constitutional: Positive for malaise/fatigue. Negative for chills, diaphoresis, fever and weight loss.  HENT: Negative for congestion.   Eyes: Negative for discharge and redness.  Respiratory: Positive for cough and shortness of breath. Negative for hemoptysis, sputum production and wheezing.   Cardiovascular: Positive for leg swelling. Negative for chest pain, palpitations, orthopnea, claudication and PND.  Gastrointestinal: Negative for abdominal pain, blood in stool, heartburn, melena, nausea and vomiting.  Genitourinary: Negative for hematuria.  Musculoskeletal: Negative for falls and myalgias.  Skin:  Negative for rash.  Neurological: Positive for weakness. Negative for dizziness, tingling, tremors, sensory change, speech change, focal weakness and loss of consciousness.  Endo/Heme/Allergies: Does not bruise/bleed easily.  Psychiatric/Behavioral: Negative for substance abuse. The patient is not nervous/anxious.   All other systems reviewed and are negative.   Labs: No results for input(s): CKTOTAL, CKMB, TROPONINI in the last 72 hours. Lab Results  Component Value Date   WBC 5.3 02/27/2017   HGB 10.6 (L) 02/27/2017   HCT 31.4 (L) 02/27/2017   MCV 86.6 02/27/2017   PLT 279 02/27/2017    Recent Labs Lab 02/26/17 1743  03/01/17 0453  NA 129*  < > 133*  K 3.4*  < > 3.9  CL 95*  < > 92*  CO2 24  < > 31  BUN 6  < > 26*  CREATININE 0.70  < > 1.29*  CALCIUM 8.7*  < > 8.2*  PROT 7.3  --   --   BILITOT 0.4  --   --   ALKPHOS 100  --   --   ALT 10*  --   --   AST 19  --   --   GLUCOSE 126*  < > 134*  < > = values in this interval not displayed. Lab Results  Component Value Date   CHOL 190 06/09/2016   HDL 34.20 (L) 06/09/2016   TRIG (H) 06/09/2016    404.0 Triglyceride is over 400; calculations on Lipids are invalid.   No results found for:  DDIMER  Radiology/Studies:  Dg Chest 2 View  Result Date: 03/01/2017 IMPRESSION: 1. Interval improvement of right base atelectasis. Heart size stable. No pulmonary venous congestion . 2. Further compression of lower thoracic vertebral body compression fracture . Electronically Signed   By: Marcello Moores  Register   On: 03/01/2017 10:46   Dg Chest 2 View  Result Date: 02/26/2017 IMPRESSION: 1. Diffuse coarse interstitial pulmonary opacity could relate to bronchial inflammation. No focal infiltrate is seen 2. Borderline cardiomegaly 3. Progressed compression fracture of the lower thoracic spine. Electronically Signed   By: Donavan Foil M.D.   On: 02/26/2017 18:16    EKG: Interpreted by me showed: NSR, 93 bpm, LBBB, 1st degree AV block, frequent PVCs Telemetry: Interpreted by me showed: NSR, LBBB, 1st degree AV block, frequent PVCs  Weights: Filed Weights   02/28/17 0357 03/01/17 0427 03/02/17 0608  Weight: 172 lb 11.2 oz (78.3 kg) 170 lb 12.8 oz (77.5 kg) 169 lb 8.5 oz (76.9 kg)     Physical Exam: Blood pressure (!) 129/57, pulse 80, temperature 98.5 F (36.9 C), temperature source Oral, resp. rate 16, height 5\' 3"  (1.6 m), weight 169 lb 8.5 oz (76.9 kg), last menstrual period 08/03/1978, SpO2 92 %. Body mass index is 30.03 kg/m. General: Well developed, well nourished, in no acute distress. Head: Normocephalic, atraumatic, sclera non-icteric, no xanthomas, nares are without discharge.  Neck: Negative for carotid bruits. JVD elevated ~ 6 cm. Lungs: Diminished breath sounds bilaterally. Faint crackles along left base. Breathing is unlabored. Heart: RRR with S1 S2. No murmurs, rubs, or gallops appreciated. Abdomen: Soft, non-tender, non-distended with normoactive bowel sounds. No hepatomegaly. No rebound/guarding. No obvious abdominal masses. Msk:  Strength and tone appear normal for age. Extremities: No clubbing or cyanosis. Trace bilateral pre-tibial edema. Distal pedal pulses are 2+ and  equal bilaterally. Neuro: Alert and oriented X 3. No facial asymmetry. No focal deficit. Moves all extremities spontaneously. Psych:  Responds to questions appropriately with a normal  affect.    Assessment and Plan:  Principal Problem:   Acute respiratory distress Active Problems:   Coronary atherosclerosis   Acute on chronic diastolic CHF (congestive heart failure) (HCC)   Acute systolic CHF (congestive heart failure) (HCC)   AKI (acute kidney injury) (HCC)   Orthostatic hypotension   Essential hypertension   LBBB (left bundle branch block)   Frequent PVCs   Hypothyroidism   Hyperlipidemia   GERD    1. Acute respiratory distress:   -Likely in the setting of acute systolic CHF and acute on chronic diastolic CHF -Wean oxygen as able  2. Acute systolic and acute on chronic diastolic CHF:  -Patient is established with [facility], though was followed by outside cardiologist during this admission  -Echo read by outside cardiology group showed newly reduced EF  -Initially diuresed, though this has been held given bump in SCr -Check bmet this morning to assess for improving renal function -Agree with holding of IV Lasix for now -Continue low-dose Coreg -Restart spironolactone pending renal function -Agree with discontinuation of amlodipine given cardiomyopathy  -Will need cardiac cath prior to discharge to assess for ischemic etiology of her acute  -She is agreeable to suspend her DNR during the cardiac cath with reinstatement afterwards -Case was discussed with daughter as well who agrees  3. Syncope/orthostatic hypotension:  -BP dropped to 79 systolic with Entresto this morning  -It appears she may not be a candidate for this medication  -Stop Entresto -Recommend changing back to losartan at a lower dose and titrate as able -Continue low-dose Coreg and spironolactone  -Continue to monitor   4. AKI:  -Likely in the setting of over diuresis/ATN with hypotension -Lasix  on hold  -Monitor  -Renal function pending this morning  5. Frequent PVCs: -Possibly in the setting of her acute systolic CHF -Would recommend cardiac cath later in this admission prior to discharge -Continue Coreg as above -Magnesium low on 7/30, s/p repletion -Recheck magnesium this morning with recommendation to replete to goal of at least 2.0  6. Hypomagnesemia: -Received PO magnesium on 7/30 -if remains low, would replete via IV today given EF and ventricular ectopy noted on telemetry and 12-lead EKG   Signed, Christell Faith, PA-C [place] Pager: [phone] 03/02/2017, 7:53 AM

## 2017-03-03 ENCOUNTER — Other Ambulatory Visit: Payer: Self-pay | Admitting: Cardiovascular Disease

## 2017-03-03 ENCOUNTER — Encounter
Admission: EM | Disposition: A | Discharge status: Home / Self Care | Payer: Self-pay | Source: Home / Self Care | Attending: Internal Medicine

## 2017-03-03 ENCOUNTER — Encounter: Payer: Self-pay | Admitting: *Deleted

## 2017-03-03 DIAGNOSIS — I420 Dilated cardiomyopathy: Secondary | ICD-10-CM

## 2017-03-03 DIAGNOSIS — J9601 Acute respiratory failure with hypoxia: Secondary | ICD-10-CM

## 2017-03-03 DIAGNOSIS — I2510 Atherosclerotic heart disease of native coronary artery without angina pectoris: Secondary | ICD-10-CM

## 2017-03-03 HISTORY — PX: LEFT HEART CATH AND CORONARY ANGIOGRAPHY: CATH118249

## 2017-03-03 LAB — BASIC METABOLIC PANEL
Anion gap: 11 (ref 5–15)
BUN: 29 mg/dL — ABNORMAL HIGH (ref 6–20)
CALCIUM: 7.7 mg/dL — AB (ref 8.9–10.3)
CO2: 28 mmol/L (ref 22–32)
CREATININE: 1.11 mg/dL — AB (ref 0.44–1.00)
Chloride: 91 mmol/L — ABNORMAL LOW (ref 101–111)
GFR, EST AFRICAN AMERICAN: 50 mL/min — AB (ref >60)
GFR, EST NON AFRICAN AMERICAN: 43 mL/min — AB (ref >60)
GLUCOSE: 136 mg/dL — AB (ref 65–99)
Potassium: 3.6 mmol/L (ref 3.5–5.1)
Sodium: 130 mmol/L — ABNORMAL LOW (ref 135–145)

## 2017-03-03 SURGERY — LEFT HEART CATH AND CORONARY ANGIOGRAPHY
Anesthesia: Moderate Sedation

## 2017-03-03 MED ORDER — SODIUM CHLORIDE 0.9 % IV SOLN
INTRAVENOUS | Status: DC
  Administered 2017-03-03: 1000 mL via INTRAVENOUS

## 2017-03-03 MED ORDER — MIDAZOLAM HCL 2 MG/2ML IJ SOLN
INTRAMUSCULAR | Status: AC
  Filled 2017-03-03: qty 2

## 2017-03-03 MED ORDER — SODIUM CHLORIDE 0.9 % IV SOLN: 250.0000 mL | INTRAVENOUS | Status: DC | PRN

## 2017-03-03 MED ORDER — IOPAMIDOL (ISOVUE-300) INJECTION 61%
INTRAVENOUS | Status: DC | PRN
  Administered 2017-03-03: 100 mL via INTRA_ARTERIAL

## 2017-03-03 MED ORDER — FUROSEMIDE 40 MG PO TABS: 40.0000 mg | ORAL_TABLET | Freq: Every day | ORAL | 0 refills | Status: DC

## 2017-03-03 MED ORDER — CARVEDILOL 3.125 MG PO TABS: 3.1250 mg | ORAL_TABLET | Freq: Two times a day (BID) | ORAL | 0 refills | Status: DC

## 2017-03-03 MED ORDER — FENTANYL CITRATE (PF) 100 MCG/2ML IJ SOLN
INTRAMUSCULAR | Status: DC | PRN
  Administered 2017-03-03: 25 ug via INTRAVENOUS

## 2017-03-03 MED ORDER — ASPIRIN 81 MG PO CHEW
CHEWABLE_TABLET | ORAL | Status: AC
  Administered 2017-03-03: 81 mg
  Filled 2017-03-03: qty 1

## 2017-03-03 MED ORDER — LOSARTAN POTASSIUM 25 MG PO TABS: 25.0000 mg | ORAL_TABLET | Freq: Every day | ORAL | 0 refills | Status: DC

## 2017-03-03 MED ORDER — SODIUM CHLORIDE 0.9% FLUSH
3.0000 mL | Freq: Two times a day (BID) | INTRAVENOUS | Status: DC
  Administered 2017-03-03: 3 mL via INTRAVENOUS

## 2017-03-03 MED ORDER — LIDOCAINE HCL (PF) 1 % IJ SOLN
INTRAMUSCULAR | Status: AC
  Filled 2017-03-03: qty 30

## 2017-03-03 MED ORDER — SODIUM CHLORIDE 0.9% FLUSH: 3.0000 mL | INTRAVENOUS | Status: DC | PRN

## 2017-03-03 MED ORDER — FENTANYL CITRATE (PF) 100 MCG/2ML IJ SOLN
INTRAMUSCULAR | Status: AC
  Filled 2017-03-03: qty 2

## 2017-03-03 MED ORDER — MIDAZOLAM HCL 2 MG/2ML IJ SOLN
INTRAMUSCULAR | Status: DC | PRN
  Administered 2017-03-03: 1 mg via INTRAVENOUS

## 2017-03-03 SURGICAL SUPPLY — 8 items
CATH 5FR JR4 DIAGNOSTIC (CATHETERS) ×3 IMPLANT
CATH INFINITI 5FR ANG PIGTAIL (CATHETERS) ×3 IMPLANT
CATH INFINITI 5FR JL4 (CATHETERS) ×3 IMPLANT
GUIDEWIRE 3MM J TIP .035 145 (WIRE) ×6 IMPLANT
KIT MANI 3VAL PERCEP (MISCELLANEOUS) ×3 IMPLANT
NEEDLE PERC 18GX7CM (NEEDLE) ×3 IMPLANT
PACK CARDIAC CATH (CUSTOM PROCEDURE TRAY) ×3 IMPLANT
SHEATH AVANTI 5FR X 11CM (SHEATH) ×3 IMPLANT

## 2017-03-03 NOTE — Care Management Important Message (Signed)
Important Message  Patient Details  Name: Kimberly [name] MRN: [id number] Date of Birth: [birth year]   Medicare Important Message Given:  Yes Notice given 03/02/2017 but forgot to document  Katrina Stack, RN 03/03/2017, 3:50 PM

## 2017-03-03 NOTE — Progress Notes (Signed)
Discharge instructions along with home medication list, vascular instructions and follow up gone over with patient and daughter both verbalized that they understood instructions. Printed rx given to patients daughter, prescriptions were electronically submitted to pharmacy, hard copy was provided per md just in case pharmacy had questions. Iv removed x1, telemetry removed. Dressing to right groin dry and intact. No c/o pain no distress noted, patient to be discharged home on ra.

## 2017-03-03 NOTE — Progress Notes (Signed)
Patient Name: Kimberly [name] Date of Encounter: 03/03/2017  Primary Cardiologist: [facility] Problem List     Principal Problem:   Acute respiratory distress Active Problems:   Coronary atherosclerosis   Acute on chronic diastolic CHF (congestive heart failure) (HCC)   Acute systolic CHF (congestive heart failure) (HCC)   AKI (acute kidney injury) (HCC)   Orthostatic hypotension   Essential hypertension   LBBB (left bundle branch block)   Frequent PVCs   Hypothyroidism   Hyperlipidemia   GERD     Subjective   No acute overnight events. No further falls/syncope. BP improved off Entresto. Renal function improved from 1/2-->1.12-->1.11. No chest pain or SOB.   Inpatient Medications    Scheduled Meds: . aspirin  81 mg Oral Daily  . carvedilol  3.125 mg Oral BID WC  . enoxaparin (LOVENOX) injection  40 mg Subcutaneous Q24H  . levothyroxine  25 mcg Oral QAC breakfast  . losartan  25 mg Oral Daily  . pantoprazole  40 mg Oral Daily  . sodium chloride flush  3 mL Intravenous Q12H   Continuous Infusions:  PRN Meds: acetaminophen **OR** acetaminophen, ondansetron **OR** ondansetron (ZOFRAN) IV, zolpidem   Vital Signs    Vitals:   03/02/17 0942 03/02/17 1129 03/02/17 2027 03/03/17 0446  BP:  124/66 134/62 (!) 128/57  Pulse: 87 79 79 96  Resp:  18 18 18   Temp:  98.6 F (37 C) 98.7 F (37.1 C) 98.7 F (37.1 C)  TempSrc:  Oral Oral Oral  SpO2: 95% 91% 92% 91%  Weight:    172 lb 4.8 oz (78.2 kg)  Height:        Intake/Output Summary (Last 24 hours) at 03/03/17 0707 Last data filed at 03/03/17 0447  Gross per 24 hour  Intake              240 ml  Output              302 ml  Net              -62 ml   Filed Weights   03/01/17 0427 03/02/17 0608 03/03/17 0446  Weight: 170 lb 12.8 oz (77.5 kg) 169 lb 8.5 oz (76.9 kg) 172 lb 4.8 oz (78.2 kg)    Physical Exam    GEN: Well nourished, well developed, in no acute distress.  HEENT: Grossly normal.  Neck:  Supple, no JVD, carotid bruits, or masses. Cardiac: RRR, no murmurs, rubs, or gallops. No clubbing, cyanosis, edema.  Radials/DP/PT 2+ and equal bilaterally.  Respiratory:  Respirations regular and unlabored, clear to auscultation bilaterally. GI: Soft, nontender, nondistended, BS + x 4. MS: no deformity or atrophy. Skin: warm and dry, no rash. Neuro:  Strength and sensation are intact. Psych: AAOx3.  Normal affect.  Labs    CBC  Recent Labs  03/02/17 0828  WBC 7.4  HGB 11.9*  HCT 35.0  MCV 86.4  PLT 229   Basic Metabolic Panel  Recent Labs  03/01/17 0453 03/02/17 0828 03/03/17 0327  NA 133* 131* 130*  K 3.9 3.8 3.6  CL 92* 91* 91*  CO2 31 29 28   GLUCOSE 134* 135* 136*  BUN 26* 27* 29*  CREATININE 1.29* 1.12* 1.11*  CALCIUM 8.2* 8.0* 7.7*  MG 1.5* 1.7  --    Liver Function Tests No results for input(s): AST, ALT, ALKPHOS, BILITOT, PROT, ALBUMIN in the last 72 hours. No results for input(s): LIPASE, AMYLASE in the last 72  hours. Cardiac Enzymes No results for input(s): CKTOTAL, CKMB, CKMBINDEX, TROPONINI in the last 72 hours. BNP Invalid input(s): POCBNP D-Dimer No results for input(s): DDIMER in the last 72 hours. Hemoglobin A1C No results for input(s): HGBA1C in the last 72 hours. Fasting Lipid Panel No results for input(s): CHOL, HDL, LDLCALC, TRIG, CHOLHDL, LDLDIRECT in the last 72 hours. Thyroid Function Tests No results for input(s): TSH, T4TOTAL, T3FREE, THYROIDAB in the last 72 hours.  Invalid input(s): FREET3  Telemetry    NSR, with 1st degree AV block, frequent PVCs - Personally Reviewed  ECG    n/a - Personally Reviewed  Radiology    Dg Chest 2 View  Result Date: 03/01/2017 IMPRESSION: 1. Interval improvement of right base atelectasis. Heart size stable. No pulmonary venous congestion . 2. Further compression of lower thoracic vertebral body compression fracture . Electronically Signed   By: Marcello Moores  Register   On: 03/01/2017 10:46     Cardiac Studies   TTE 02/27/2017: Study Conclusions  - Left ventricle: The cavity size was severely dilated. There was   mild concentric hypertrophy. Systolic function was moderately to   severely reduced. The estimated ejection fraction was 35%.   Diffuse hypokinesis. - Aortic valve: There was mild regurgitation. - Mitral valve: There was mild regurgitation. - Pulmonary arteries: PA peak pressure: 36 mm Hg (S).  Impressions:  - The right ventricular systolic pressure was increased consistent   with mild pulmonary hypertension. Severely dilated left atrium   and left ventricle with severe left ventricular systolic   dysfunction] LVEF 33-35%, with diffuse hypokinesis. Severe mitral   annular calcification with mild to moderate mitral regurgitation   and mild pulmonary hypertension. Fibrocalcific 5 aortic valve   without any significant aortic stenosis but mild aortic   regurgitation. There is grade 2 diastolic dysfunction.  Patient Profile     83 y.o. female with history of nonobstructive CAD by cardiac catheterization 2 in 2001 and 5009, chronic diastolic CHF, chronic chest pain, hypertension, hyperlipidemia, insomnia, GERD, obesity, and known left bundle-branch block who was admitted to [facility] on 7/28 with increased shortness of breath. Patient was initially followed by outside cardiologist though family requested her primary cardiologist evaluate her on 7/31. She was found to have acute systolic and acute on chronic diastolic CHF by echo this admission.  Assessment & Plan    1. Acute respiratory distress:   -Likely in the setting of acute systolic CHF and acute on chronic diastolic CHF -Weaned to room air  2. Acute systolic and acute on chronic diastolic CHF:  -Patient is established with [place], though was followed by outside cardiologist during this admission  -Echo read by outside cardiology group showed newly reduced EF  -Initially diuresed, though this has  been held given bump in SCr -Renal function improved with holding of Lasix -Continue low-dose Coreg -Restart spironolactone pending renal function s/p cardiac cath -Agree with discontinuation of amlodipine given cardiomyopathy  -Will proceed with cardiac cath this morning -She is agreeable to suspend her DNR during the cardiac cath with reinstatement afterwards -Case was discussed with daughter as well who agrees -Risks and benefits of cardiac catheterization have been discussed with the patient including risks of bleeding, bruising, infection, kidney damage, stroke, heart attack, and death. The patient understands these risks and is willing to proceed with the procedure. All questions have been answered and concerns listened to  3. Syncope/orthostatic hypotension:  -BP dropped to 79 systolic with Truett Perna held on 7/31 -It appears she  may not be a candidate for this medication  -Placed back on PTA losartan, though lower dose, tolerating well, continue -Continue low-dose Coreg  -Restart spironolactone s/p cardiac cath -Continue to monitor   4. AKI:  -Likely in the setting of over diuresis/ATN with hypotension -Lasix on hold  -Monitor  -Renal function improving  5. Frequent PVCs: -Asymptomatic -Possibly in the setting of her acute systolic CHF -Would recommend cardiac cath as above -Continue Coreg as above -Magnesium low on 7/30, s/p repletion  6. Hypomagnesemia: -Received PO magnesium on 7/30 -Improving  Signed, Marcille Blanco [facility] Pager: [phone] 03/03/2017, 7:07 AM   Attending Note Patient seen and examined, agree with detailed note above,  Patient presentation and plan discussed on rounds.   Feels well this morning, scheduled for catheterization Catheterization performed without any complications No significant coronary disease Results discussed at length with patient and family  Long discussion concerning symptoms of shortness of  breath, CHF management She reports taking Lasix 20 mg daily at home, Will often take extra Lasix after lunch for any fluid retention  Coming down to catheterization today she had saturations in the high 80s, was put on oxygen  On physical exam unable to estimate JVP as she is supine, Appears comfortable, lungs with scattered Rales at the bases, heart sounds regular with normal S1-S2 no murmurs appreciated, abdomen soft nontender, no significant lower extremity edema  Lab work reviewed showing creatinine 1.1, BUN 29, potassium 3.6  Ejection fraction appeared 45% on echocardiogram/LV gram Normal left ventricular end-diastolic pressure  --- Acute respiratory failure, hypoxia Component of Acute on chronic systolic and diastolic CHF Still with hypoxia requiring 2-3 L nasal cannula oxygen before and after cardiac catheterization Will likely need Lasix 40 mg daily at home with extra Lasix for 3 pound weight gain Would avoid aggressive diuresis today given contrast load Restart Lasix IV tomorrow if still having hypoxia requiring oxygen  --- Orthostasis Likely secondary to drug induced entresto held Would check orthostatics prior to discharge  --- Cardiomyopathy Ejection fraction improved on cardiac catheterization compared to echocardiogram, likely after diuresis and medications. Also with frequent PVCs which may effect ejection fraction measurement   Greater than 50% was spent in counseling and coordination of care with patient Total encounter time 25 minutes or more   Signed: Esmond Plants  M.D., Ph.D. [facility]

## 2017-03-03 NOTE — Progress Notes (Signed)
Pt. Placed on size wise bed related to high fall risk and previous fall.

## 2017-03-04 NOTE — Discharge Summary (Signed)
Kimberly [name] at [place] NAME: Kimberly [name]    MR#:  [id number]  DATE OF BIRTH:  1927/11/13  DATE OF ADMISSION:  02/26/2017 ADMITTING PHYSICIAN: Lance Coon, MD  DATE OF DISCHARGE: 03/03/2017  5:57 PM  PRIMARY CARE PHYSICIAN: Tower, Wynelle Fanny, MD   ADMISSION DIAGNOSIS:  Hypoxia [V56.43] Systolic congestive heart failure, unspecified HF chronicity ([place]) [I50.20]  DISCHARGE DIAGNOSIS:  Principal Problem:   Acute respiratory distress Active Problems:   Hypothyroidism   Hyperlipidemia   Essential hypertension   Coronary atherosclerosis   LBBB (left bundle branch block)   GERD   Acute on chronic diastolic CHF (congestive heart failure) (HCC)   Acute systolic CHF (congestive heart failure) (HCC)   AKI (acute kidney injury) ([place])   Orthostatic hypotension   Frequent PVCs   SECONDARY DIAGNOSIS:   Past Medical History:  Diagnosis Date  . Allergy    allergic rhinitis  . CAD (coronary artery disease)   . CHF (congestive heart failure) ([place])   . GERD (gastroesophageal reflux disease)   . Hyperlipidemia   . Hypertension   . Hypothyroid   . Insomnia      ADMITTING HISTORY  HISTORY OF PRESENT ILLNESS:  Kimberly [name]  is a 83 y.o. female who presents with Progressive lower extremity edema and shortness of breath. Evaluation here in the ED is consistent with exacerbation of her heart failure, with a significantly elevated BNP and chest x-ray showing vascular congestion. Hospitals were called for admission   HOSPITAL COURSE:   * Acute on chronic systolic CHF * Acute hypoxic respiratory failure * Orthostatic syncope due to diuresis * Hypertension * Nonischemic cardiomyopathy with ejection fraction 35%  Patient was admitted to telemetry floor. Started on IV Lasix with which she diuresed well. Was -4 L. But developed orthostatic hypotension and on standing up had an episode of syncope but did not fall. Mild worsening of renal function with diuresis.  At this point Lasix was held. Blood pressure improved and her dizziness resolved. An echocardiogram was checked which showed ejection fraction of 35%. Troponins remained stable. Patient was initially seen by Dr. Darrow Bussing of cardiology who suggested starting Entresto, Coreg and Aldactone. Patient unfortunately has not been able to tolerate Entresto. She was switched to Coreg, losartan and Aldactone. She requested to be seen by her cardiologist Dr. Rockey Situ who did see her prior to discharge. Cardiac catheterization was done which showed insignificant CAD.  Patient has been weaned off oxygen. Blood pressures improved. Shortness of breath resolved and lower extremity edema improved. Vision is being discharged home with home health services in a stable condition. Lasix prescribed at discharge.  Patient on beta blocker, ACE inhibitor, Lasix  CONSULTS OBTAINED:  Treatment Team:  Minna Merritts, MD  DRUG ALLERGIES:   Allergies  Allergen Reactions  . Ciprofloxacin Other (See Comments)    REACTION: burning of mouth and skin  . Lisinopril Cough  . Remeron [Mirtazapine] Other (See Comments)    constipation  . Sulfonamide Derivatives Other (See Comments)    REACTION: malaise  . Tetanus Toxoid Swelling  . Vesicare [Solifenacin Succinate] Other (See Comments)    Blurred vision and weakness  . Zoloft [Sertraline Hcl] Nausea And Vomiting  . Cephalexin Rash and Other (See Comments)    REACTION: rash and burning sensation to skin on arms and legs with itching.  Madelin Headings [Amitriptyline] Palpitations  . Penicillins Rash and Other (See Comments)    Has patient had a PCN reaction causing  immediate rash, facial/tongue/throat swelling, SOB or lightheadedness with hypotension: Unknown Has patient had a PCN reaction causing severe rash involving mucus membranes or skin necrosis: Unknown Has patient had a PCN reaction that required hospitalization: Unknown Has patient had a PCN reaction occurring within the last  10 years: No If all of the above answers are "NO", then may proceed with Cephalosporin use. Has since take Augmentin with no problem    DISCHARGE MEDICATIONS:   Discharge Medication List as of 03/03/2017  3:41 PM    START taking these medications   Details  spironolactone (ALDACTONE) 25 MG tablet Take 1 tablet (25 mg total) by mouth daily., Starting Mon 03/01/2017, Normal      CONTINUE these medications which have CHANGED   Details  carvedilol (COREG) 3.125 MG tablet Take 1 tablet (3.125 mg total) by mouth 2 (two) times daily with a meal., Starting Wed 03/03/2017, Normal    furosemide (LASIX) 40 MG tablet Take 1 tablet (40 mg total) by mouth daily., Starting Wed 03/03/2017, Normal    losartan (COZAAR) 25 MG tablet Take 1 tablet (25 mg total) by mouth daily., Starting Thu 03/04/2017, Normal      CONTINUE these medications which have NOT CHANGED   Details  aspirin 81 MG tablet Take 81 mg by mouth daily.  , Historical Med    cetirizine (ZYRTEC) 10 MG tablet Take 10 mg by mouth daily as needed for allergies. , Historical Med    esomeprazole (NEXIUM) 40 MG capsule TAKE 1 CAPSULE (40 MG TOTAL) BY MOUTH DAILY., Normal    fluticasone (FLONASE) 50 MCG/ACT nasal spray Place 2 sprays into both nostrils daily., Starting Fri 10/13/2013, Normal    guaiFENesin (MUCINEX) 600 MG 12 hr tablet Take 600 mg by mouth 2 (two) times daily as needed for cough or to loosen phlegm. , Historical Med    KLOR-CON M20 20 MEQ tablet TAKE 1 TABLET (20 MEQ TOTAL) BY MOUTH DAILY., Starting Mon 10/19/2016, Normal    levothyroxine (SYNTHROID, LEVOTHROID) 25 MCG tablet Take 1 tablet (25 mcg total) by mouth daily., Starting Tue 06/16/2016, Normal    Multiple Vitamin (MULTIVITAMIN WITH MINERALS) TABS tablet Take 1 tablet by mouth daily., Historical Med    zolpidem (AMBIEN) 10 MG tablet TAKE 1/2 TO 1 TABLET BY MOUTH DAILY AT BEDTIME AS NEEDED, Phone In    docusate sodium 100 MG CAPS Take 100 mg by mouth 2 (two) times daily.  For constipation, Starting Fri 02/02/2014, Print      STOP taking these medications     amLODipine (NORVASC) 10 MG tablet      metoprolol (LOPRESSOR) 50 MG tablet      sacubitril-valsartan (ENTRESTO) 24-26 MG         Today   VITAL SIGNS:  Blood pressure (!) 129/58, pulse 77, temperature 98.2 F (36.8 C), temperature source Oral, resp. rate 19, height 5\' 3"  (1.6 m), weight 78 kg (172 lb), last menstrual period 08/03/1978, SpO2 95 %.  I/O:   Intake/Output Summary (Last 24 hours) at 03/04/17 1235 Last data filed at 03/03/17 1300  Gross per 24 hour  Intake              240 ml  Output                0 ml  Net              240 ml    PHYSICAL EXAMINATION:  Physical Exam  GENERAL:  83 y.o.-year-old patient lying in  the bed with no acute distress.  LUNGS: Normal breath sounds bilaterally, no wheezing, rales,rhonchi or crepitation. No use of accessory muscles of respiration.  CARDIOVASCULAR: S1, S2 normal. No murmurs, rubs, or gallops.  ABDOMEN: Soft, non-tender, non-distended. Bowel sounds present. No organomegaly or mass.  NEUROLOGIC: Moves all 4 extremities. PSYCHIATRIC: The patient is alert and oriented x 3.  SKIN: No obvious rash, lesion, or ulcer.   DATA REVIEW:   CBC  Recent Labs Lab 03/02/17 0828  WBC 7.4  HGB 11.9*  HCT 35.0  PLT 305    Chemistries   Recent Labs Lab 02/26/17 1743  03/02/17 0828 03/03/17 0327  NA 129*  < > 131* 130*  K 3.4*  < > 3.8 3.6  CL 95*  < > 91* 91*  CO2 24  < > 29 28  GLUCOSE 126*  < > 135* 136*  BUN 6  < > 27* 29*  CREATININE 0.70  < > 1.12* 1.11*  CALCIUM 8.7*  < > 8.0* 7.7*  MG  --   < > 1.7  --   AST 19  --   --   --   ALT 10*  --   --   --   ALKPHOS 100  --   --   --   BILITOT 0.4  --   --   --   < > = values in this interval not displayed.  Cardiac Enzymes  Recent Labs Lab 02/26/17 1743  TROPONINI <0.03    Microbiology Results  Results for orders placed or performed in visit on 12/07/16  Urine culture      Status: None   Collection Time: 12/07/16  3:56 PM  Result Value Ref Range Status   Culture KLEBSIELLA PNEUMONIAE  Final   Colony Count Greater than 100,000 CFU/mL  Final   Organism ID, Bacteria KLEBSIELLA PNEUMONIAE  Final      Susceptibility   Klebsiella pneumoniae -  (no method available)    AMPICILLIN >=32 Resistant     AMOX/CLAVULANIC <=2 Sensitive     AMPICILLIN/SULBACTAM 8 Sensitive     PIP/TAZO <=4 Sensitive     IMIPENEM <=0.25 Sensitive     CEFAZOLIN <=4 Not Reportable     CEFTRIAXONE <=1 Sensitive     CEFTAZIDIME <=1 Sensitive     CEFEPIME <=1 Sensitive     GENTAMICIN <=1 Sensitive     TOBRAMYCIN <=1 Sensitive     CIPROFLOXACIN <=0.25 Sensitive     LEVOFLOXACIN <=0.12 Sensitive     NITROFURANTOIN 64 Intermediate     TRIMETH/SULFA* <=20 Sensitive      * NR=NOT REPORTABLE,SEE COMMENTORAL therapy:A cefazolin MIC of <32 predicts susceptibility to the oral agents cefaclor,cefdinir,cefpodoxime,cefprozil,cefuroxime,cephalexin,and loracarbef when used for therapy of uncomplicated UTIs due to E.coli,K.pneumomiae,and P.mirabilis. PARENTERAL therapy: A cefazolinMIC of >8 indicates resistance to parenteralcefazolin. An alternate test method must beperformed to confirm susceptibility to parenteralcefazolin.    RADIOLOGY:  No results found.  Follow up with PCP in 1 week.  Management plans discussed with the patient, family and they are in agreement.  CODE STATUS:  Code Status History    Date Active Date Inactive Code Status Order ID Comments User Context   02/27/2017  2:05 AM 03/03/2017  8:57 PM DNR [id number]  Lance Coon, MD Inpatient   01/31/2014  8:11 PM 02/02/2014 12:50 PM DNR [id number]  Annita Brod, MD Inpatient    Questions for Most Recent Historical Code Status (Order [id number])    Question Answer Comment  In the event of cardiac or respiratory ARREST Do not call a "code blue"    In the event of cardiac or respiratory ARREST Do not perform Intubation, CPR,  defibrillation or ACLS    In the event of cardiac or respiratory ARREST Use medication by any route, position, wound care, and other measures to relive pain and suffering. May use oxygen, suction and manual treatment of airway obstruction as needed for comfort.       TOTAL TIME TAKING CARE OF THIS PATIENT ON DAY OF DISCHARGE: more than 30 minutes.   Hillary Bow R M.D on 03/04/2017 at 12:35 PM  Between 7am to 6pm - Pager - [phone]  After 6pm go to [email or web address] - password [email or web address] [place] [facility]  Office  [phone]  CC: Primary care physician; Tower, Wynelle Fanny, MD  Note: This dictation was prepared with Dragon dictation along with smaller phrase technology. Any transcriptional errors that result from this process are unintentional.

## 2017-03-05 ENCOUNTER — Telehealth: Payer: Self-pay | Admitting: Cardiovascular Disease

## 2017-03-05 NOTE — Telephone Encounter (Signed)
Spoke with daughter Maudie Mercury. Dicussed medications and and when to take them. Kim voiced understand.

## 2017-03-05 NOTE — Telephone Encounter (Signed)
Pt's daughter Maudie Mercury (on [place]) would like to discuss pt's medications  Pt was recently released from hospital and wants to make sure she is taking correctly.

## 2017-03-05 NOTE — Telephone Encounter (Signed)
Patient completed and signed ROI and brought in $25 fee. Faxed to [employer]. Mailed original interoffice mail.

## 2017-03-08 ENCOUNTER — Ambulatory Visit: Payer: Medicare Other | Admitting: Family

## 2017-03-10 ENCOUNTER — Ambulatory Visit: Payer: Medicare Other | Attending: Family | Admitting: Family

## 2017-03-10 ENCOUNTER — Encounter: Payer: Self-pay | Admitting: Family

## 2017-03-10 VITALS — BP 134/95 | HR 88 | Resp 20 | Ht 63.0 in | Wt 169.5 lb

## 2017-03-10 DIAGNOSIS — I2510 Atherosclerotic heart disease of native coronary artery without angina pectoris: Secondary | ICD-10-CM | POA: Insufficient documentation

## 2017-03-10 DIAGNOSIS — Z882 Allergy status to sulfonamides status: Secondary | ICD-10-CM | POA: Diagnosis not present

## 2017-03-10 DIAGNOSIS — G4700 Insomnia, unspecified: Secondary | ICD-10-CM | POA: Insufficient documentation

## 2017-03-10 DIAGNOSIS — Z7982 Long term (current) use of aspirin: Secondary | ICD-10-CM | POA: Insufficient documentation

## 2017-03-10 DIAGNOSIS — I5022 Chronic systolic (congestive) heart failure: Secondary | ICD-10-CM | POA: Diagnosis not present

## 2017-03-10 DIAGNOSIS — F419 Anxiety disorder, unspecified: Secondary | ICD-10-CM | POA: Insufficient documentation

## 2017-03-10 DIAGNOSIS — Z880 Allergy status to penicillin: Secondary | ICD-10-CM | POA: Diagnosis not present

## 2017-03-10 DIAGNOSIS — Z79899 Other long term (current) drug therapy: Secondary | ICD-10-CM | POA: Insufficient documentation

## 2017-03-10 DIAGNOSIS — K219 Gastro-esophageal reflux disease without esophagitis: Secondary | ICD-10-CM | POA: Diagnosis not present

## 2017-03-10 DIAGNOSIS — R5383 Other fatigue: Secondary | ICD-10-CM | POA: Diagnosis present

## 2017-03-10 DIAGNOSIS — E785 Hyperlipidemia, unspecified: Secondary | ICD-10-CM | POA: Insufficient documentation

## 2017-03-10 DIAGNOSIS — E039 Hypothyroidism, unspecified: Secondary | ICD-10-CM | POA: Insufficient documentation

## 2017-03-10 DIAGNOSIS — I110 Hypertensive heart disease with heart failure: Secondary | ICD-10-CM | POA: Insufficient documentation

## 2017-03-10 DIAGNOSIS — I10 Essential (primary) hypertension: Secondary | ICD-10-CM

## 2017-03-10 NOTE — Progress Notes (Signed)
Patient ID: Kimberly [name], female    DOB: [birth year], 83 y.o.   MRN: [id number]  HPI  Kimberly [name] is a 83 y/o female with a history of CAD, GERD, hyperlipidemia, HTN, hypothyroid, anxiety and chronic heart failure.   Echo report from 02/27/17 reviewed and shows an EF of 35% along with mild AR/MR and PA pressure of 36 mm Hg. Cardiac catheterization done 03/03/17 showed nonobstructive disease with normal EF of 45% and normal LVEDP. 30% stenosis mid RCA to distal RCA and 30% stenosis in prox LAD.  Admitted 02/26/17 due to HF exacerbation. Initially given IV diuretics and lost ~4L. Had an episode of orthostatic hypotension with syncope but without fall. Cardiology consult obtained. Echo and catheterization were done, medications were initiated/adjusted and she was discharged home after 5 days.  She presents today for her initial visit with a chief complaint of mild fatigue upon moderate exertion. She describes this as being chronic in nature having been present for several months. She has associated anxiety, leg weakness and difficulty sleeping.   Past Medical History:  Diagnosis Date  . Allergy    allergic rhinitis  . CAD (coronary artery disease)   . CHF (congestive heart failure) ([place])   . GERD (gastroesophageal reflux disease)   . Hyperlipidemia   . Hypertension   . Hypothyroid   . Insomnia    Past Surgical History:  Procedure Laterality Date  . [place] CATH AND CORONARY ANGIOGRAPHY N/A 03/03/2017   Procedure: Left Heart Cath and Coronary Angiography;  Surgeon: Minna Merritts, MD;  Location: [facility];  Service: Cardiovascular;  Laterality: N/A;  . OOPHORECTOMY    . THYROID SURGERY     Family History  Problem Relation Age of Onset  . Heart disease Mother   . Hypertension Mother    Social History  Substance Use Topics  . Smoking status: Never Smoker  . Smokeless tobacco: Never Used  . Alcohol use No    Allergies  Allergen Reactions  . Ciprofloxacin Other (See Comments)    REACTION: burning of mouth and skin  . Lisinopril Cough  . Remeron [Mirtazapine] Other (See Comments)    constipation  . Sulfonamide Derivatives Other (See Comments)    REACTION: malaise  . Tetanus Toxoid Swelling  . Vesicare [Solifenacin Succinate] Other (See Comments)    Blurred vision and weakness  . Zoloft [Sertraline Hcl] Nausea And Vomiting  . Cephalexin Rash and Other (See Comments)    REACTION: rash and burning sensation to skin on arms and legs with itching.  Madelin Headings [Amitriptyline] Palpitations  . Penicillins Rash and Other (See Comments)    Has patient had a PCN reaction causing immediate rash, facial/tongue/throat swelling, SOB or lightheadedness with hypotension: Unknown Has patient had a PCN reaction causing severe rash involving mucus membranes or skin necrosis: Unknown Has patient had a PCN reaction that required hospitalization: Unknown Has patient had a PCN reaction occurring within the last 10 years: No If all of the above answers are "NO", then may proceed with Cephalosporin use. Has since take Augmentin with no problem   Prior to Admission medications   Medication Sig Start Date End Date Taking? Authorizing Provider  aspirin 81 MG tablet Take 81 mg by mouth daily.     Yes [provider]  carvedilol (COREG) 3.125 MG tablet Take 1 tablet (3.125 mg total) by mouth 2 (two)  times daily with a meal. 03/03/17  Yes Sudini, Alveta Heimlich, MD  cetirizine (ZYRTEC) 10 MG tablet Take 10 mg by mouth daily as needed for allergies.    Yes [provider]  docusate sodium 100 MG CAPS Take 100 mg by mouth 2 (two) times daily. For constipation 02/02/14  Yes Rai, Ripudeep K, MD  esomeprazole (NEXIUM) 40 MG capsule TAKE 1 CAPSULE (40 MG TOTAL) BY MOUTH DAILY. 10/12/16  Yes Tower, Wynelle Fanny, MD  fluticasone (FLONASE) 50 MCG/ACT nasal spray Place 2 sprays into both nostrils daily. 10/13/13  Yes Tower, Wynelle Fanny, MD  furosemide (LASIX) 40 MG tablet Take 1 tablet (40 mg total) by mouth daily. 03/03/17  Yes Hillary Bow, MD  guaiFENesin (MUCINEX) 600 MG 12 hr tablet Take 600 mg by mouth 2 (two) times daily as needed for cough or to loosen phlegm.    Yes [provider]  KLOR-CON M20 20 MEQ tablet TAKE 1 TABLET (20 MEQ TOTAL) BY MOUTH DAILY. 10/19/16  Yes Tower, Wynelle Fanny, MD  levothyroxine (SYNTHROID, LEVOTHROID) 25 MCG tablet Take 1 tablet (25 mcg total) by mouth daily. 06/16/16  Yes Tower, Wynelle Fanny, MD  losartan (COZAAR) 25 MG tablet Take 1 tablet (25 mg total) by mouth daily. 03/04/17  Yes Hillary Bow, MD  Multiple Vitamin (MULTIVITAMIN WITH MINERALS) TABS tablet Take 1 tablet by mouth daily.   Yes [provider]  spironolactone (ALDACTONE) 25 MG tablet Take 1 tablet (25 mg total) by mouth daily. 03/01/17  Yes Sudini, Alveta Heimlich, MD  zolpidem (AMBIEN) 10 MG tablet TAKE 1/2 TO 1 TABLET BY MOUTH DAILY AT BEDTIME AS NEEDED 02/18/17  Yes Tower, Wynelle Fanny, MD    Review of Systems  Constitutional: Positive for fatigue. Negative for appetite change.  HENT: Negative for congestion, postnasal drip and sore throat.   Eyes: Negative.   Respiratory: Negative for cough, chest tightness and shortness of breath.   Cardiovascular: Negative for chest pain, palpitations and leg swelling.  Gastrointestinal: Negative for abdominal distention and abdominal pain.  Endocrine: Negative.   Genitourinary: Negative.   Musculoskeletal: Negative for back pain and neck pain.  Skin: Negative.   Allergic/Immunologic: Negative.   Neurological: Positive for weakness. Negative for dizziness and light-headedness.  Hematological: Negative for adenopathy. Bruises/bleeds easily.  Psychiatric/Behavioral: Positive for sleep disturbance (chronic difficulty sleeping soundly; sleeping on 2 pillows). Negative for dysphoric mood. The patient is nervous/anxious.    Vitals:   03/10/17 0832  BP: (!) 134/95  Pulse: 88  Resp: 20   SpO2: 96%  Weight: 169 lb 8 oz (76.9 kg)  Height: 5\' 3"  (1.6 m)   Wt Readings from Last 3 Encounters:  03/10/17 169 lb 8 oz (76.9 kg)  03/03/17 172 lb (78 kg)  12/07/16 183 lb 8 oz (83.2 kg)   Lab Results  Component Value Date   CREATININE 1.11 (H) 03/03/2017   CREATININE 1.12 (H) 03/02/2017   CREATININE 1.29 (H) 03/01/2017   Physical Exam  Constitutional: She is oriented to person, place, and time. She appears well-developed and well-nourished.  HENT:  Head: Normocephalic and atraumatic.  Neck: Normal range of motion. Neck supple. No JVD present.  Cardiovascular: Normal rate and regular rhythm.   Pulmonary/Chest: Effort normal and breath sounds normal.  Abdominal: Soft. She exhibits no distension. There is no tenderness.  Musculoskeletal: She exhibits no edema or tenderness.  Neurological: She is alert and oriented to person, place, and time.  Skin: Skin is warm and dry.  Psychiatric: She has a  normal mood and affect. Her behavior is normal. Thought content normal.  Nursing note and vitals reviewed.  Assessment & Plan:  1: Chronic heart failure with reduced ejection fraction- - NYHA class II - euvolemic today - already weighing daily. Instructed her to call for an overnight weight gain of >2 pounds or a weekly weight gain of >5 pounds - not adding salt to her food. Discussed the importance of closely following a 2000mg  sodium diet. Reviewed how to read food labels and written dietary information was given to her about this. - currently receiving home health PT - sees cardiologist Rockey Situ) on 04/07/17; to have a BMP drawn at that time  2: HTN- - BP looks ok today - BMP from 03/03/17 shows sodium 130, potassium 3.6 and GFR 43 - saw PCP (Tower) 12/07/16  3: Anxiety-  - patient expresses anxiety about her health - explained heart failure, medications and diet - encouraged her to speak with her PCP if anxiety worsens  Medications were reviewed.  Return in 1 month or sooner  for any questions/problems before then.

## 2017-03-10 NOTE — Patient Instructions (Signed)
Continue weighing daily and call for an overnight weight gain of > 2 pounds or a weekly weight gain of >5 pounds. 

## 2017-03-11 DIAGNOSIS — I5042 Chronic combined systolic (congestive) and diastolic (congestive) heart failure: Secondary | ICD-10-CM | POA: Insufficient documentation

## 2017-03-17 NOTE — Telephone Encounter (Signed)
Received forms from [employer] for MD review and sign. Placed in nurse box.

## 2017-03-19 ENCOUNTER — Ambulatory Visit (INDEPENDENT_AMBULATORY_CARE_PROVIDER_SITE_OTHER): Payer: Medicare Other | Admitting: Nurse Practitioner

## 2017-03-19 ENCOUNTER — Encounter: Payer: Self-pay | Admitting: Nurse Practitioner

## 2017-03-19 VITALS — BP 120/60 | HR 89 | Ht 64.0 in | Wt 170.2 lb

## 2017-03-19 DIAGNOSIS — I5042 Chronic combined systolic (congestive) and diastolic (congestive) heart failure: Secondary | ICD-10-CM | POA: Diagnosis not present

## 2017-03-19 DIAGNOSIS — I110 Hypertensive heart disease with heart failure: Secondary | ICD-10-CM

## 2017-03-19 NOTE — Patient Instructions (Addendum)
Medication Instructions:  Your physician recommends that you continue on your current medications as directed. Please refer to the Current Medication list given to you today.   Labwork: BMET today  Testing/Procedures: none  Follow-Up: Your physician recommends that you schedule a follow-up appointment in: 2 months with Dr. Rockey Situ.   Any Other Special Instructions Will Be Listed Below (If Applicable).     If you need a refill on your cardiac medications before your next appointment, please call your pharmacy.  Basic Metabolic Panel Why am I having this test? A basic metabolic panel measures levels of the following substances in your blood:  Glucose. Glucose is a simple sugar that serves as the main source of energy for your body.  Creatinine. Creatinine is a waste product of normal muscle activity. It is excreted from the body by the kidneys.  Blood urea nitrogen (BUN). Urea nitrogen is a waste product of protein breakdown. It is produced when excess protein in your body is broken down and used for energy. It is excreted by the kidneys.  Electrolytes. Electrolytes are negatively or positively charged particles that are dissolved in the water of different body compartments. This includes the serum portion of blood, water inside cells, and water outside cells. Concentrations of electrolytes vary among the different fluid compartments. Electrolytes are tightly regulated to maintain a salt-water and acid-base balance in the body. The electrolytes measured in a basic metabolic panel include: ? Potassium. ? Sodium. ? Chloride. ? Calcium. ? Bicarbonate.  What kind of sample is taken? A blood sample is required for this test. It is usually collected by inserting a needle into a vein. How do I prepare for this test? Your health care provider may ask you to avoid eating or drinking anything before your blood sample is taken. Do not eat or drink anything after midnight on the night  before the procedure or as directed by your health care provider. What are the reference ranges? Reference ranges are considered healthy ranges established after testing a large group of healthy people. Reference ranges may vary among different people, labs, and hospitals. It is your responsibility to obtain your test results. Ask the lab or department performing the test when and how you will get your results. The following are reference ranges for each part of a basic metabolic panel: Glucose  Cord: 45-96 mg/dL or 2.5-5.3 mmol/L (SI units).  Premature infant: 20-60 mg/dL or 1.1-3.3 mmol/L.  Neonate: 30-60 mg/dL or 1.7-3.3 mmol/L.  Infant: 40-90 mg/dL or 2.2-5 mmol/L.  Child under 4 years old: 60-100 mg/dL or 3.3-5.5 mmol/L.  Adult or child over 39 years old: ? Fasting: 70-110 mg/dL or less than 6.1 mmol/L. ? Random (nonfasting or casual): less than or equal to 200 mg/dL or less than 11.1 mmol/L.  Elderly: increase in normal range after age 58 years. Creatinine  Child under 89 years old: 0.1-0.4 mg/dL.  Child 54-46 years old: 0.2-0.5 mg/dL.  Child 63-74 years old: 0.3-0.6 mg/dL.  Child or adolescent 50-31 years old: 0.4-1 mg/dL.  Adult 22-7 years old: ? Female: 0.5-1 mg/dL. ? Female: 0.6-1.2 mg/dL.  Adult 79-61 years old: ? Female: 0.5-1.1 mg/dL. ? Female: 0.6-1.3 mg/dL.  Adult 9 years old and above: ? Female: 0.5-1.2 mg/dL. ? Female: 0.7-1.3 mg/dL. BUN  Cord: 21-40 mg/dL.  Newborn: 3-12 mg/dL.  Infant: 5-18 mg/dL.  Child: 5-18 mg/dL.  Adult: 10-20 mg/dL or 3.6-7.1 mmol/L (SI units).  Elderly: may be slightly higher than adult. Potassium  Newborn: 3.9-5.9 mEq/L.  Infant:  4.1-5.3 mEq/L.  Child: 3.4-4.7 mEq/L.  Adult or elderly: 3.5-5 mEq/L or 3.5-5 mmol/L (SI units). Sodium  Newborn: 134-144 mEq/L.  Infant: 134-150 mEq/L.  Child: 136-145 mEq/L.  Adult or elderly: 136-145 mEq/L or 136-145 mmol/L (SI units). Chloride  Premature infant: 95-110  mEq/L.  Newborn: 96-106 mEq/L.  Child: 90-110 mEq/L.  Adult or elderly: 98-106 mEq/L or 98-106 mmol/L (SI units). Calcium  Total calcium: ? Newborn under 46 days old: 7.6-10.4 mg/dL or 1.9-2.6 mmol/L. ? Umbilical: 3-66.2 mg/dL or 2.25-2.88 mmol/L. ? 67 days to 81 years old: 9-10.6 mg/dL or 2.30-2.65 mmol/L. ? Child: 8.8-10.8 mg/dL or 2.2-2.7 mmol/L. ? Adult: 9-10.5 mg/dL or 2.25-2.62 mmol/L.  Ionized calcium: ? Newborn: 4.20-5.58 mg/dL or 1.05-1.37 mmol/L. ? 2 months to 81 years old: 4.80-5.52 mg/dL or 1.20-1.38 mmol/L. ? Adult: 4.5-5.6 mg/dL or 1.05-1.30 mmol/L. Bicarbonate  Newborn: 13-22 mEq/L.  Infant: 20-28 mEq/L.  Child: 20-28 mEq/L.  Adult or elderly: 23-30 mEq/L or 23-30 mmol/L (SI units). What do the results mean? Diet and levels of activity can have an effect on your test results. Sometimes they can be the cause of values that are outside of normal limits. However, sometimes values outside normal limits can indicate a medical disorder.  Glucose: ? Abnormally high glucose levels (hyperglycemia) are usually associated with prediabetes mellitus and diabetes mellitus. They can also occur with severe stress on the body. This can come from surgery or events such as stroke or trauma. Overactive thyroid gland and pancreatitis or pancreatic cancer can also cause abnormally high glucose levels. ? Abnormally low glucose levels (hypoglycemia) can occur with underactive thyroid gland and rare insulin-secreting tumors (insulinoma).  Creatinine: ? Abnormally high creatinine levels are most commonly seen in kidney failure. They can also be seen with overactive thyroid (hyperthyroidism), conditions related to overgrowth of the body, abnormal breakdown of muscle tissue, and early muscular dystrophy. ? Abnormally low creatinine levels can indicate low muscle mass associated with malnutrition or late-stage muscular dystrophy.  BUN: ? Abnormally high BUN levels generally mean that your  kidneys are not functioning normally. ? Abnormally low BUN levels can be seen with malnutrition and liver failure.  Potassium: ? Abnormally high potassium levels are most often seen with kidney disease, massive destruction of red blood cells, and adrenal gland failure. ? Abnormally low potassium levels are seen with excessive levels of the hormone aldosterone.  Sodium: ? Abnormally high sodium levels can be seen with dehydration, excessive thirst, and urination due to abnormally low levels of antidiuretic hormone (diabetes insipidus). They can also be seen with excessive levels of aldosterone or cortisol in the body. ? Abnormally low levels of sodium can be seen with congestive heart failure, cirrhosis of the liver, kidney failure, and the syndrome of inappropriate antidiuretic hormone (SIADH). ? Chloride: ? Abnormally high levels of chloride can be seen with acute kidney failure, diabetes insipidus, prolonged diarrhea, and poisoning with aspirin or bromide. ? Abnormally low levels of chloride can be seen with prolonged vomiting, acute adrenal gland failure, and SIADH.  Calcium: ? Abnormally high levels of calcium can occur with excessive activity of the parathyroid glands, certain cancers, and a type of inflammation seen in sarcoidosis and tuberculosis. ? Abnormally low levels of calcium can be seen with underactive parathyroid glands, vitamin D deficiency, and acute pancreatitis.  Bicarbonate: ? Abnormally high bicarbonate levels are seen after prolonged vomiting and diuretic therapy, which lead to a decrease in the amount of acid in the body (metabolic alkalosis). They can also be seen  in conditions that increase the amount of bicarbonate in the body. These conditions include rare hereditary disorders that interfere with how your kidneys handle electrolytes. ? Abnormally low bicarbonate levels are seen with conditions that cause your body to produce too much acid (metabolic acidosis). These  conditions include uncontrolled diabetes mellitus and poisoning with aspirin, methanol, or antifreeze (ethylene glycol).  Talk with your health care provider to discuss your results, treatment options, and if necessary, the need for more tests. Talk with your health care provider if you have any questions about your results. Talk with your health care provider to discuss your results, treatment options, and if necessary, the need for more tests. Talk with your health care provider if you have any questions about your results. This information is not intended to replace advice given to you by your health care provider. Make sure you discuss any questions you have with your health care provider. Document Released: 08/12/2004 Document Revised: 03/23/2016 Document Reviewed: 12/18/2013 Elsevier Interactive Patient Education  [employer].

## 2017-03-19 NOTE — Progress Notes (Signed)
Office Visit    Patient Name: Kimberly [name] Date of Encounter: 03/19/2017  Primary Care Provider:  Abner Greenspan, MD Primary Cardiologist:  Johnny Bridge, MD   Chief Complaint    83 year old female with history of nonobstructive CAD, combined systolic and diastolic heart failure, hypertension, hyperlipidemia, GERD, and left bundle branch block, who presents for follow-up after recent hospital physician for heart failure with new finding of LV dysfunction.  Past Medical History    Past Medical History:  Diagnosis Date  . Allergy    allergic rhinitis  . Chronic combined systolic and diastolic CHF (congestive heart failure) ([place])    a. 01/2014 Echo: EF 50-55%, no rwma, Gr1 DD, triv AI, mild MR;  b. 01/2017 Echo: EF 35%, diff HK, gr2  DD, fibrocalcific AoV w/o AS, mild AI/MR, sev dil LA, PASP 75mmHg.  Marland Kitchen GERD (gastroesophageal reflux disease)   . Hyperlipidemia   . Hypertension   . Hypothyroid   . Insomnia   . Non-obstructive CAD (coronary artery disease)    a. Non-obstructive dzs by caths in 2001 and 2006;  b. 03/2017 Cath: LM nl, LAD 30p, LCX nl, RCA 9m/d, EF 45-50%.   Past Surgical History:  Procedure Laterality Date  . [place] CATH AND CORONARY ANGIOGRAPHY N/A 03/03/2017   Procedure: Left Heart Cath and Coronary Angiography;  Surgeon: Minna Merritts, MD;  Location: [facility];  Service: Cardiovascular;  Laterality: N/A;  . OOPHORECTOMY    . THYROID SURGERY      Allergies  Allergies  Allergen Reactions  . Ciprofloxacin Other (See Comments)    REACTION: burning of mouth and skin  . Lisinopril Cough  . Remeron [Mirtazapine] Other (See Comments)    constipation  . Sulfonamide Derivatives Other (See Comments)    REACTION: malaise  . Tetanus Toxoid Swelling  . Vesicare [Solifenacin Succinate] Other (See Comments)    Blurred vision and weakness  . Zoloft [Sertraline Hcl] Nausea And Vomiting    . Cephalexin Rash and Other (See Comments)    REACTION: rash and burning sensation to skin on arms and legs with itching.  Madelin Headings [Amitriptyline] Palpitations  . Penicillins Rash and Other (See Comments)    Has patient had a PCN reaction causing immediate rash, facial/tongue/throat swelling, SOB or lightheadedness with hypotension: Unknown Has patient had a PCN reaction causing severe rash involving mucus membranes or skin necrosis: Unknown Has patient had a PCN reaction that required hospitalization: Unknown Has patient had a PCN reaction occurring within the last 10 years: No If all of the above answers are "NO", then may proceed with Cephalosporin use. Has since take Augmentin with no problem    History of Present Illness    83 year old female with the above, past medical history including nonobstructive CAD by catheterizations in 2001 8341, chronic diastolic congestive heart failure (with new finding of systolic failure), chronic chest pain, hypertension, hyperlipidemia, insomnia, GERD, obesity, and left bundle branch block.she was recently admitted in late July with dyspnea and pulmonary edema.  Echocardiogram was performed and showed an EF of 35% with diffuse hypokinesis. Patient was initially seen by Dr. Humphrey Rolls and placed on entresto, which resulted in hypotension and orthostasis. This was subsequently discontinued and our team was consulted.  In light of LV dysfunction, she underwent diagnostic catheterization which revealed nonobstructive CAD and mild pulmonary hypertension.  Medical therapy was recommended and  she was discharged home on beta blocker, ARB, spironolactone, and Lasix therapy. She followed up in heart failure clinic on August 8, at which time her weight was stable. She says that since then, weias remained approximately 168-169 pounds at home. She denies dyspnea, chest pain, palpitations, PND, orthopnea, dizziness, syncope, edema, or early satiety.  Home Medications    Prior  to Admission medications   Medication Sig Start Date End Date Taking? Authorizing Provider  aspirin 81 MG tablet Take 81 mg by mouth daily.     Yes [provider]  carvedilol (COREG) 3.125 MG tablet Take 1 tablet (3.125 mg total) by mouth 2 (two) times daily with a meal. 03/03/17  Yes Sudini, Alveta Heimlich, MD  cetirizine (ZYRTEC) 10 MG tablet Take 10 mg by mouth daily as needed for allergies.    Yes [provider]  docusate sodium 100 MG CAPS Take 100 mg by mouth 2 (two) times daily. For constipation 02/02/14  Yes Rai, Ripudeep K, MD  esomeprazole (NEXIUM) 40 MG capsule TAKE 1 CAPSULE (40 MG TOTAL) BY MOUTH DAILY. 10/12/16  Yes Tower, Wynelle Fanny, MD  fluticasone (FLONASE) 50 MCG/ACT nasal spray Place 2 sprays into both nostrils daily. 10/13/13  Yes Tower, Wynelle Fanny, MD  furosemide (LASIX) 40 MG tablet Take 1 tablet (40 mg total) by mouth daily. 03/03/17  Yes Hillary Bow, MD  guaiFENesin (MUCINEX) 600 MG 12 hr tablet Take 600 mg by mouth 2 (two) times daily as needed for cough or to loosen phlegm.    Yes [provider]  KLOR-CON M20 20 MEQ tablet TAKE 1 TABLET (20 MEQ TOTAL) BY MOUTH DAILY. 10/19/16  Yes Tower, Wynelle Fanny, MD  levothyroxine (SYNTHROID, LEVOTHROID) 25 MCG tablet Take 1 tablet (25 mcg total) by mouth daily. 06/16/16  Yes Tower, Wynelle Fanny, MD  losartan (COZAAR) 25 MG tablet Take 1 tablet (25 mg total) by mouth daily. 03/04/17  Yes Hillary Bow, MD  Multiple Vitamin (MULTIVITAMIN WITH MINERALS) TABS tablet Take 1 tablet by mouth daily.   Yes [provider]  spironolactone (ALDACTONE) 25 MG tablet Take 1 tablet (25 mg total) by mouth daily. 03/01/17  Yes Sudini, Alveta Heimlich, MD  zolpidem (AMBIEN) 10 MG tablet TAKE 1/2 TO 1 TABLET BY MOUTH DAILY AT BEDTIME AS NEEDED 02/18/17  Yes Tower, Wynelle Fanny, MD    Review of Systems    Doing well since d/c.  She denies chest pain, palpitations, dyspnea, pnd, orthopnea, n, v, dizziness, syncope, edema, weight gain, or early satiety.  All  other systems reviewed and are otherwise negative except as noted above.  Physical Exam    VS:  Ht 5\' 4"  (1.626 m)   Wt 170 lb 4 oz (77.2 kg)   LMP 08/03/1978   BMI 29.22 kg/m  , BMI Body mass index is 29.22 kg/m. GEN: Well nourished, well developed, in no acute distress.  HEENT: normal.  Neck: Supple, no JVD, carotid bruits, or masses. Cardiac: RRR, 1/6 SEM RUSB, no rubs, or gallops. No clubbing, cyanosis, edema.  Radials/DP/PT 2+ and equal bilaterally.  Respiratory:  Respirations regular and unlabored, clear to auscultation bilaterally. GI: Soft, nontender, nondistended, BS + x 4. MS: no deformity or atrophy. Skin: warm and dry, no rash. Neuro:  Strength and sensation are intact. Psych: Normal affect.  Accessory Clinical Findings    ECG - regular sinus rhythm, first-degree AV block, PVC, left bundle branch block, left axis deviation, no acute ST or T changes.  Assessment & Plan  1.  Chronic combined systolic and diastolic congestive heart failure: Patient was recently admitted to [facility] with dyspnea and finding of pulmonary edema with an EF of 35%. Catheterization did not show any significant CAD.  She did have mild bump in her creatinine with diuresis during hospitalization, though this did normalize prior to discharge. She has remained on beta blocker, ARB, Lasix, and spironolactone therapy. She has been doing well at home without dyspnea, edema, or orthopnea. She is careful with her salt intake. Weight has been stable at approximately 168-169 pounds. She is euvolemic on exam today. I will follow-up a basic metabolic panel today. Continue current doses of medications.  Given hypotension with low dose entresto during hospitalization, I will simply continue losartan.  2. Hypertensive heart disease: The pressure is stable today. Continue current regimen.  3. Nonobstructive CAD with history of chest pain: Recent cath showed stable coronary anatomy without any obstructive  disease. Continue low-dose aspirin.  4.  Disposition: Follow-up basic metabolic panel today in the setting of mild renal insufficiency during hospitalization. She has follow-up in heart failure clinic in a few weeks. Follow-up with Dr. Rockey Situ in approximately 2 months or sooner if necessary.   Murray Hodgkins, NP 03/19/2017, 8:20 AM

## 2017-03-20 LAB — BASIC METABOLIC PANEL
BUN/Creatinine Ratio: 16 (ref 12–28)
BUN: 20 mg/dL (ref 8–27)
CALCIUM: 9 mg/dL (ref 8.7–10.3)
CHLORIDE: 89 mmol/L — AB (ref 96–106)
CO2: 22 mmol/L (ref 20–29)
CREATININE: 1.29 mg/dL — AB (ref 0.57–1.00)
GFR, EST AFRICAN AMERICAN: 43 mL/min/{1.73_m2} — AB (ref >59)
GFR, EST NON AFRICAN AMERICAN: 37 mL/min/{1.73_m2} — AB (ref >59)
Glucose: 120 mg/dL — ABNORMAL HIGH (ref 65–99)
Potassium: 4.9 mmol/L (ref 3.5–5.2)
Sodium: 126 mmol/L — ABNORMAL LOW (ref 134–144)

## 2017-03-22 ENCOUNTER — Other Ambulatory Visit: Payer: Self-pay | Admitting: *Deleted

## 2017-03-22 DIAGNOSIS — Z79899 Other long term (current) drug therapy: Secondary | ICD-10-CM

## 2017-03-22 DIAGNOSIS — I10 Essential (primary) hypertension: Secondary | ICD-10-CM

## 2017-03-22 MED ORDER — FUROSEMIDE 40 MG PO TABS: 20.0000 mg | ORAL_TABLET | Freq: Every day | ORAL | 0 refills | Status: DC

## 2017-03-25 ENCOUNTER — Other Ambulatory Visit: Payer: Self-pay | Admitting: Cardiovascular Disease

## 2017-03-25 ENCOUNTER — Other Ambulatory Visit: Payer: Self-pay | Admitting: Family Medicine

## 2017-03-26 ENCOUNTER — Telehealth: Payer: Self-pay | Admitting: Cardiovascular Disease

## 2017-03-26 ENCOUNTER — Other Ambulatory Visit: Payer: Self-pay | Admitting: Nurse Practitioner

## 2017-03-26 MED ORDER — FUROSEMIDE 40 MG PO TABS: 40.0000 mg | ORAL_TABLET | Freq: Every day | ORAL | 0 refills | Status: DC

## 2017-03-26 NOTE — Telephone Encounter (Signed)
[employer] forms received and completed. Placed red folder in outgoing mailbox up front.

## 2017-03-28 ENCOUNTER — Other Ambulatory Visit: Payer: Self-pay | Admitting: Cardiovascular Disease

## 2017-03-29 ENCOUNTER — Other Ambulatory Visit
Admission: RE | Admit: 2017-03-29 | Discharge: 2017-03-29 | Disposition: A | Discharge status: Home / Self Care | Payer: Medicare Other | Source: Ambulatory Visit | Attending: Nurse Practitioner | Admitting: Nurse Practitioner

## 2017-03-29 ENCOUNTER — Other Ambulatory Visit: Payer: Self-pay | Admitting: *Deleted

## 2017-03-29 DIAGNOSIS — E2839 Other primary ovarian failure: Secondary | ICD-10-CM | POA: Diagnosis present

## 2017-03-29 DIAGNOSIS — I10 Essential (primary) hypertension: Secondary | ICD-10-CM | POA: Diagnosis present

## 2017-03-29 DIAGNOSIS — Z79899 Other long term (current) drug therapy: Secondary | ICD-10-CM | POA: Insufficient documentation

## 2017-03-29 LAB — BASIC METABOLIC PANEL
ANION GAP: 8 (ref 5–15)
BUN: 19 mg/dL (ref 6–20)
CALCIUM: 8.7 mg/dL — AB (ref 8.9–10.3)
CO2: 27 mmol/L (ref 22–32)
CREATININE: 1.02 mg/dL — AB (ref 0.44–1.00)
Chloride: 97 mmol/L — ABNORMAL LOW (ref 101–111)
GFR calc Af Amer: 56 mL/min — ABNORMAL LOW (ref >60)
GFR, EST NON AFRICAN AMERICAN: 48 mL/min — AB (ref >60)
GLUCOSE: 118 mg/dL — AB (ref 65–99)
Potassium: 4.3 mmol/L (ref 3.5–5.1)
Sodium: 132 mmol/L — ABNORMAL LOW (ref 135–145)

## 2017-03-29 MED ORDER — FUROSEMIDE 40 MG PO TABS: 20.0000 mg | ORAL_TABLET | Freq: Every day | ORAL | 3 refills | Status: DC

## 2017-03-29 NOTE — Telephone Encounter (Signed)
Received paper work from [employer] off to [employer]

## 2017-03-30 NOTE — Telephone Encounter (Signed)
fyi

## 2017-04-03 DIAGNOSIS — S42309A Unspecified fracture of shaft of humerus, unspecified arm, initial encounter for closed fracture: Secondary | ICD-10-CM

## 2017-04-03 HISTORY — DX: Unspecified fracture of shaft of humerus, unspecified arm, initial encounter for closed fracture: S42.309A

## 2017-04-07 ENCOUNTER — Ambulatory Visit: Payer: Medicare Other | Admitting: Family

## 2017-04-15 ENCOUNTER — Encounter: Payer: Self-pay | Admitting: Family Medicine

## 2017-04-15 ENCOUNTER — Ambulatory Visit (INDEPENDENT_AMBULATORY_CARE_PROVIDER_SITE_OTHER): Payer: Medicare Other | Admitting: Family Medicine

## 2017-04-15 VITALS — BP 132/78 | HR 84 | Temp 97.8°F | Wt 178.5 lb

## 2017-04-15 DIAGNOSIS — Z23 Encounter for immunization: Secondary | ICD-10-CM | POA: Diagnosis not present

## 2017-04-15 DIAGNOSIS — I5022 Chronic systolic (congestive) heart failure: Secondary | ICD-10-CM

## 2017-04-15 DIAGNOSIS — R0602 Shortness of breath: Secondary | ICD-10-CM

## 2017-04-15 LAB — BASIC METABOLIC PANEL
BUN: 18 mg/dL (ref 6–23)
CALCIUM: 8.9 mg/dL (ref 8.4–10.5)
CO2: 23 mEq/L (ref 19–32)
Chloride: 93 mEq/L — ABNORMAL LOW (ref 96–112)
Creatinine, Ser: 1.31 mg/dL — ABNORMAL HIGH (ref 0.40–1.20)
GFR: 40.79 mL/min — AB (ref >60.00)
GLUCOSE: 106 mg/dL — AB (ref 70–99)
Potassium: 4.6 mEq/L (ref 3.5–5.1)
SODIUM: 126 meq/L — AB (ref 135–145)

## 2017-04-15 LAB — CBC WITH DIFFERENTIAL/PLATELET
BASOS PCT: 0.9 % (ref 0.0–3.0)
Basophils Absolute: 0.1 10*3/uL (ref 0.0–0.1)
EOS PCT: 1.5 % (ref 0.0–5.0)
Eosinophils Absolute: 0.1 10*3/uL (ref 0.0–0.7)
HCT: 28.9 % — ABNORMAL LOW (ref 36.0–46.0)
Hemoglobin: 9.4 g/dL — ABNORMAL LOW (ref 12.0–15.0)
LYMPHS ABS: 1 10*3/uL (ref 0.7–4.0)
Lymphocytes Relative: 17.6 % (ref 12.0–46.0)
MCHC: 32.5 g/dL (ref 30.0–36.0)
MCV: 89 fl (ref 78.0–100.0)
MONO ABS: 0.8 10*3/uL (ref 0.1–1.0)
Monocytes Relative: 12.8 % — ABNORMAL HIGH (ref 3.0–12.0)
NEUTROS ABS: 4 10*3/uL (ref 1.4–7.7)
NEUTROS PCT: 67.2 % (ref 43.0–77.0)
Platelets: 258 10*3/uL (ref 150.0–400.0)
RBC: 3.25 Mil/uL — ABNORMAL LOW (ref 3.87–5.11)
RDW: 14.1 % (ref 11.5–15.5)
WBC: 5.9 10*3/uL (ref 4.0–10.5)

## 2017-04-15 LAB — BRAIN NATRIURETIC PEPTIDE: Pro B Natriuretic peptide (BNP): 3962 pg/mL — ABNORMAL HIGH (ref 0.0–100.0)

## 2017-04-15 MED ORDER — FLUTICASONE PROPIONATE 50 MCG/ACT NA SUSP: 2.0000 | Freq: Every day | NASAL | 3 refills | Status: AC

## 2017-04-15 NOTE — Patient Instructions (Addendum)
Labs today.  Possible viral infection, along with heart failure flare.  Continue mucinex and claritin. Restart flonase sent to pharmacy.  Increase lasix to 40mg  daily for next 4 days.  Let us know if not improving with this.  Seek care if chest pain or worsening trouble breathing.

## 2017-04-15 NOTE — Assessment & Plan Note (Addendum)
4d h/o head and chest congestion associated with dyspnea and feverish. No signs of bacterial infection today, anticipate viral process. Suggested continued mucinex, add flonase. In h/o concomitant combined CHF, weight gain noted, and crackles on lung exam, anticipate she also has mild CHF exacerbation. Will also increase lasix to 40mg  daily x 4 days.  Check CBC for pallor noted today.  She does have f/u planned next week with CHF clinic.  Update if not improving with treatment or new sxs of chest pain, fever, worsening dyspnea.  Pt and daughter agree with plan.

## 2017-04-15 NOTE — Progress Notes (Signed)
BP 132/78 (BP Location: Left Arm, Patient Position: Sitting, Cuff Size: Normal)   Pulse 84   Temp 97.8 F (36.6 C) (Oral)   Wt 178 lb 8 oz (81 kg)   LMP 08/03/1978   SpO2 95%   BMI 30.64 kg/m    CC: congestion Subjective:    Patient ID: Kimberly [name], female    DOB: 12-05-1927, 83 y.o.   MRN: [id number]  HPI: Kimberly [name] is a 83 y.o. female presenting on 04/15/2017 for Congestion (Feels tightness in lower ribs on both sides about 4 days. Minor SOB)   4d h/o chest congestion and lower chest tightness with mild dyspnea. General malaise. Subjective fevers but hasn't checked temperature. Head and chest congestion. Mild PNDrainage, trouble clearing phlegm.   No chills, cough, ear or tooth pain, headaches, ST. No chest pain.  Uses 2 pillows at night. No orthopnea. No significant leg swelling.   Treating with mucinex and zyrtec without much improvement.   No sick contacts at home. No h/o asthma.  Non smoker.   H/o combined systolic/diastolic CHF.  Managed with lasix 20mg  at home - not voiding well with this.  Has f/u with heart failure clinic next week.  Weight of 173 at home. Today elevated in our office (10 lbs up since last month).   Relevant past medical, surgical, family and social history reviewed and updated as indicated. Interim medical history since our last visit reviewed. Allergies and medications reviewed and updated. Outpatient Medications Prior to Visit  Medication Sig Dispense Refill  . aspirin 81 MG tablet Take 81 mg by mouth daily.      . carvedilol (COREG) 3.125 MG tablet Take 1 tablet (3.125 mg total) by mouth 2 (two) times daily with a meal. 60 tablet 0  . cetirizine (ZYRTEC) 10 MG tablet Take 10 mg by mouth daily as needed for allergies.     Marland Kitchen docusate sodium 100 MG CAPS Take 100 mg by mouth 2 (two) times daily. For constipation 60 capsule 3  . esomeprazole (NEXIUM) 40 MG capsule TAKE 1 CAPSULE (40 MG TOTAL) BY MOUTH DAILY. 90 capsule 2  . furosemide (LASIX) 40  MG tablet Take 0.5 tablets (20 mg total) by mouth daily. 30 tablet 3  . guaiFENesin (MUCINEX) 600 MG 12 hr tablet Take 600 mg by mouth 2 (two) times daily as needed for cough or to loosen phlegm.     Marland Kitchen levothyroxine (SYNTHROID, LEVOTHROID) 25 MCG tablet Take 1 tablet (25 mcg total) by mouth daily. 90 tablet 3  . losartan (COZAAR) 25 MG tablet TAKE 1 TABLET BY MOUTH EVERY DAY 30 tablet 3  . Multiple Vitamin (MULTIVITAMIN WITH MINERALS) TABS tablet Take 1 tablet by mouth daily.    Marland Kitchen spironolactone (ALDACTONE) 25 MG tablet TAKE 1 TABLET BY MOUTH EVERY DAY 30 tablet 3  . zolpidem (AMBIEN) 10 MG tablet TAKE 1/2 TO 1 TABLET BY MOUTH DAILY AT BEDTIME AS NEEDED 30 tablet 3  . fluticasone (FLONASE) 50 MCG/ACT nasal spray Place 2 sprays into both nostrils daily. 48 g 3   No facility-administered medications prior to visit.      Per HPI unless specifically indicated in ROS section below Review of Systems     Objective:    BP 132/78 (BP Location: Left Arm, Patient Position: Sitting, Cuff Size: Normal)   Pulse 84   Temp 97.8 F (36.6 C) (Oral)   Wt 178 lb 8 oz (81 kg)   LMP 08/03/1978   SpO2 95%  BMI 30.64 kg/m   Wt Readings from Last 3 Encounters:  04/15/17 178 lb 8 oz (81 kg)  03/19/17 170 lb 4 oz (77.2 kg)  03/10/17 169 lb 8 oz (76.9 kg)    Physical Exam  Constitutional: She appears well-developed and well-nourished. No distress.  HENT:  Head: Normocephalic and atraumatic.  Right Ear: Hearing, tympanic membrane, external ear and ear canal normal.  Left Ear: Hearing, tympanic membrane, external ear and ear canal normal.  Nose: No mucosal edema or rhinorrhea. Right sinus exhibits no maxillary sinus tenderness and no frontal sinus tenderness. Left sinus exhibits no maxillary sinus tenderness and no frontal sinus tenderness.  Mouth/Throat: Uvula is midline, oropharynx is clear and moist and mucous membranes are normal. No oropharyngeal exudate, posterior oropharyngeal edema, posterior  oropharyngeal erythema or tonsillar abscesses.  Discomfort to palpation at ethmoid sinuses Nasal turbinate pallor with congestion  Eyes: Pupils are equal, round, and reactive to light. Conjunctivae and EOM are normal. No scleral icterus.  Conjunctival pallor  Neck: Normal range of motion. Neck supple.  Cardiovascular: Normal rate, regular rhythm and intact distal pulses.   Murmur heard. Pulmonary/Chest: Effort normal and breath sounds normal. No respiratory distress. She has no wheezes. She has no rales.  Crackles bibasilarly  Lymphadenopathy:    She has no cervical adenopathy.  Skin: Skin is warm and dry. No rash noted. There is pallor.  Nursing note and vitals reviewed.  Results for orders placed or performed during the hospital encounter of 99/83/38  Basic metabolic panel  Result Value Ref Range   Sodium 132 (L) 135 - 145 mmol/L   Potassium 4.3 3.5 - 5.1 mmol/L   Chloride 97 (L) 101 - 111 mmol/L   CO2 27 22 - 32 mmol/L   Glucose, Bld 118 (H) 65 - 99 mg/dL   BUN 19 6 - 20 mg/dL   Creatinine, Ser 1.02 (H) 0.44 - 1.00 mg/dL   Calcium 8.7 (L) 8.9 - 10.3 mg/dL   GFR calc non Af Amer 48 (L) >60 mL/min   GFR calc Af Amer 56 (L) >60 mL/min   Anion gap 8 5 - 15      Assessment & Plan:   Problem List Items Addressed This Visit    Chronic systolic heart failure (HCC) (Chronic)   Shortness of breath - Primary    4d h/o head and chest congestion associated with dyspnea and feverish. No signs of bacterial infection today, anticipate viral process. Suggested continued mucinex, add flonase. In h/o concomitant combined CHF, weight gain noted, and crackles on lung exam, anticipate she also has mild CHF exacerbation. Will also increase lasix to 40mg  daily x 4 days.  Check CBC for pallor noted today.  She does have f/u planned next week with CHF clinic.  Update if not improving with treatment or new sxs of chest pain, fever, worsening dyspnea.  Pt and daughter agree with plan.        Relevant Orders   CBC with Differential/Platelet   Basic metabolic panel   Brain natriuretic peptide    Other Visit Diagnoses    Need for influenza vaccination       Relevant Orders   Flu Vaccine QUAD 6+ mos PF IM (Fluarix Quad PF) (Completed)       Follow up plan: Return if symptoms worsen or fail to improve.  Ria Bush, MD

## 2017-04-16 ENCOUNTER — Other Ambulatory Visit: Payer: Self-pay | Admitting: Family Medicine

## 2017-04-16 ENCOUNTER — Telehealth: Payer: Self-pay | Admitting: Cardiovascular Disease

## 2017-04-16 DIAGNOSIS — D649 Anemia, unspecified: Secondary | ICD-10-CM

## 2017-04-16 NOTE — Telephone Encounter (Signed)
Pt daughter states pt went PCP for congestion, and they changed her Furosemide to 40 mg . Please call to discuss.

## 2017-04-16 NOTE — Telephone Encounter (Signed)
Pt's daughter, Maudie Mercury, reports PCP increased lasix to 40mg  x 4 days at yesterday's office visit due to pt's SOB. Maudie Mercury inquires if there is anything else they should do. Reviewed daily weights, low sodium diet, elevate legs, wear compression stockings when possible Pt has appt Monday with the Heart Failure Clinic. Daughter verbalized understanding and is agreeable with plan. No further questions at this time.

## 2017-04-19 ENCOUNTER — Ambulatory Visit: Payer: Medicare Other | Admitting: Family

## 2017-04-20 ENCOUNTER — Telehealth: Payer: Self-pay | Admitting: Cardiovascular Disease

## 2017-04-20 NOTE — Telephone Encounter (Signed)
Patient went to see her PCP last Thursday due to coughing up some phlegm and nausea. She states the PCP said her lung sounds were normal and that it was probably her CHF. They increased her furosemide to 40 mg daily and she reports her weight is 178. Reviewed previous weight and it was also 178 and advised her that excessive phlegm can certainly cause nausea and that the mucinex and zyrtec should help reduce that. Instructed her to make sure she monitors her daily weights and to call us if she has weight gain of 2 pounds in one day or 5 pounds in one week. She also wanted to know if there is anything for anxiety. Advised that she check with her PCP for that because we do not prescribe for that. Discussed at length regarding weights and fluid management with instructions to call back if it should worsen or not improve. She was very appreciative for the call and had no further questions or concerns at this time.

## 2017-04-20 NOTE — Telephone Encounter (Signed)
Patient daughter kim calling to let us know that patient has gained weight and now has a productive cough   Patient now weighs 178 lbs   Patient saw pcp  And they say her lungs are clear and that this is related to patient CHF   Please call daughter to discuss

## 2017-04-27 ENCOUNTER — Emergency Department: Payer: Medicare Other

## 2017-04-27 ENCOUNTER — Telehealth: Payer: Self-pay | Admitting: Family

## 2017-04-27 ENCOUNTER — Ambulatory Visit: Payer: Medicare Other | Admitting: Family

## 2017-04-27 ENCOUNTER — Inpatient Hospital Stay
Admission: EM | Admit: 2017-04-27 | Discharge: 2017-05-01 | DRG: 562 | Disposition: A | Discharge status: Skilled Nursing Facility | Payer: Medicare Other | Attending: Internal Medicine | Admitting: Internal Medicine

## 2017-04-27 ENCOUNTER — Other Ambulatory Visit: Payer: Self-pay

## 2017-04-27 ENCOUNTER — Encounter: Payer: Self-pay | Admitting: Emergency Medicine

## 2017-04-27 DIAGNOSIS — Z7982 Long term (current) use of aspirin: Secondary | ICD-10-CM | POA: Diagnosis not present

## 2017-04-27 DIAGNOSIS — D638 Anemia in other chronic diseases classified elsewhere: Secondary | ICD-10-CM | POA: Diagnosis present

## 2017-04-27 DIAGNOSIS — S22081D Stable burst fracture of T11-T12 vertebra, subsequent encounter for fracture with routine healing: Secondary | ICD-10-CM

## 2017-04-27 DIAGNOSIS — Z880 Allergy status to penicillin: Secondary | ICD-10-CM | POA: Diagnosis not present

## 2017-04-27 DIAGNOSIS — K219 Gastro-esophageal reflux disease without esophagitis: Secondary | ICD-10-CM | POA: Diagnosis present

## 2017-04-27 DIAGNOSIS — Z79899 Other long term (current) drug therapy: Secondary | ICD-10-CM | POA: Diagnosis not present

## 2017-04-27 DIAGNOSIS — S42202A Unspecified fracture of upper end of left humerus, initial encounter for closed fracture: Secondary | ICD-10-CM

## 2017-04-27 DIAGNOSIS — I110 Hypertensive heart disease with heart failure: Secondary | ICD-10-CM | POA: Diagnosis present

## 2017-04-27 DIAGNOSIS — I2510 Atherosclerotic heart disease of native coronary artery without angina pectoris: Secondary | ICD-10-CM | POA: Diagnosis present

## 2017-04-27 DIAGNOSIS — Z66 Do not resuscitate: Secondary | ICD-10-CM | POA: Diagnosis present

## 2017-04-27 DIAGNOSIS — E039 Hypothyroidism, unspecified: Secondary | ICD-10-CM | POA: Diagnosis present

## 2017-04-27 DIAGNOSIS — Z881 Allergy status to other antibiotic agents status: Secondary | ICD-10-CM

## 2017-04-27 DIAGNOSIS — I5023 Acute on chronic systolic (congestive) heart failure: Secondary | ICD-10-CM | POA: Diagnosis present

## 2017-04-27 DIAGNOSIS — Z882 Allergy status to sulfonamides status: Secondary | ICD-10-CM

## 2017-04-27 DIAGNOSIS — G4700 Insomnia, unspecified: Secondary | ICD-10-CM | POA: Diagnosis present

## 2017-04-27 DIAGNOSIS — W010XXA Fall on same level from slipping, tripping and stumbling without subsequent striking against object, initial encounter: Secondary | ICD-10-CM | POA: Diagnosis present

## 2017-04-27 DIAGNOSIS — R55 Syncope and collapse: Secondary | ICD-10-CM | POA: Diagnosis present

## 2017-04-27 DIAGNOSIS — E785 Hyperlipidemia, unspecified: Secondary | ICD-10-CM | POA: Diagnosis present

## 2017-04-27 DIAGNOSIS — Z888 Allergy status to other drugs, medicaments and biological substances status: Secondary | ICD-10-CM | POA: Diagnosis not present

## 2017-04-27 DIAGNOSIS — S42212A Unspecified displaced fracture of surgical neck of left humerus, initial encounter for closed fracture: Secondary | ICD-10-CM | POA: Diagnosis present

## 2017-04-27 DIAGNOSIS — G3184 Mild cognitive impairment, so stated: Secondary | ICD-10-CM | POA: Diagnosis present

## 2017-04-27 DIAGNOSIS — S22081A Stable burst fracture of T11-T12 vertebra, initial encounter for closed fracture: Secondary | ICD-10-CM | POA: Diagnosis present

## 2017-04-27 DIAGNOSIS — Y92009 Unspecified place in unspecified non-institutional (private) residence as the place of occurrence of the external cause: Secondary | ICD-10-CM

## 2017-04-27 DIAGNOSIS — I509 Heart failure, unspecified: Secondary | ICD-10-CM

## 2017-04-27 DIAGNOSIS — R05 Cough: Secondary | ICD-10-CM

## 2017-04-27 DIAGNOSIS — E871 Hypo-osmolality and hyponatremia: Secondary | ICD-10-CM

## 2017-04-27 DIAGNOSIS — R059 Cough, unspecified: Secondary | ICD-10-CM

## 2017-04-27 LAB — SODIUM
SODIUM: 113 mmol/L — AB (ref 135–145)
Sodium: 115 mmol/L — CL (ref 135–145)
Sodium: 118 mmol/L — CL (ref 135–145)

## 2017-04-27 LAB — CBC WITH DIFFERENTIAL/PLATELET
BASOS PCT: 0 %
Basophils Absolute: 0 10*3/uL (ref 0–0.1)
EOS ABS: 0 10*3/uL (ref 0–0.7)
EOS PCT: 0 %
HCT: 27 % — ABNORMAL LOW (ref 35.0–47.0)
HEMOGLOBIN: 9.5 g/dL — AB (ref 12.0–16.0)
LYMPHS ABS: 0.7 10*3/uL — AB (ref 1.0–3.6)
Lymphocytes Relative: 11 %
MCH: 29.8 pg (ref 26.0–34.0)
MCHC: 35.1 g/dL (ref 32.0–36.0)
MCV: 84.9 fL (ref 80.0–100.0)
MONO ABS: 0.7 10*3/uL (ref 0.2–0.9)
MONOS PCT: 11 %
NEUTROS PCT: 78 %
Neutro Abs: 5.5 10*3/uL (ref 1.4–6.5)
PLATELETS: 160 10*3/uL (ref 150–440)
RBC: 3.18 MIL/uL — ABNORMAL LOW (ref 3.80–5.20)
RDW: 13.9 % (ref 11.5–14.5)
WBC: 7 10*3/uL (ref 3.6–11.0)

## 2017-04-27 LAB — COMPREHENSIVE METABOLIC PANEL
ALK PHOS: 84 U/L (ref 38–126)
ALT: 12 U/L — AB (ref 14–54)
AST: 17 U/L (ref 15–41)
Albumin: 3.3 g/dL — ABNORMAL LOW (ref 3.5–5.0)
Anion gap: 11 (ref 5–15)
BUN: 18 mg/dL (ref 6–20)
CALCIUM: 7.9 mg/dL — AB (ref 8.9–10.3)
CHLORIDE: 79 mmol/L — AB (ref 101–111)
CO2: 24 mmol/L (ref 22–32)
CREATININE: 1.11 mg/dL — AB (ref 0.44–1.00)
GFR calc non Af Amer: 43 mL/min — ABNORMAL LOW (ref >60)
GFR, EST AFRICAN AMERICAN: 50 mL/min — AB (ref >60)
GLUCOSE: 95 mg/dL (ref 65–99)
Potassium: 3.9 mmol/L (ref 3.5–5.1)
SODIUM: 114 mmol/L — AB (ref 135–145)
Total Bilirubin: 0.4 mg/dL (ref 0.3–1.2)
Total Protein: 5.9 g/dL — ABNORMAL LOW (ref 6.5–8.1)

## 2017-04-27 LAB — URINALYSIS, COMPLETE (UACMP) WITH MICROSCOPIC
BILIRUBIN URINE: NEGATIVE
Bacteria, UA: NONE SEEN
Glucose, UA: NEGATIVE mg/dL
HGB URINE DIPSTICK: NEGATIVE
KETONES UR: NEGATIVE mg/dL
Leukocytes, UA: NEGATIVE
Nitrite: NEGATIVE
Protein, ur: 30 mg/dL — AB
RBC / HPF: NONE SEEN RBC/hpf (ref 0–5)
SPECIFIC GRAVITY, URINE: 1.008 (ref 1.005–1.030)
pH: 5 (ref 5.0–8.0)

## 2017-04-27 LAB — TSH: TSH: 0.353 u[IU]/mL (ref 0.350–4.500)

## 2017-04-27 LAB — OSMOLALITY: Osmolality: 237 mOsm/kg — CL (ref 275–295)

## 2017-04-27 LAB — OSMOLALITY, URINE: Osmolality, Ur: 233 mOsm/kg — ABNORMAL LOW (ref 300–900)

## 2017-04-27 LAB — TROPONIN I: TROPONIN I: 0.04 ng/mL — AB (ref <0.03)

## 2017-04-27 LAB — SODIUM, URINE, RANDOM: Sodium, Ur: 27 mmol/L

## 2017-04-27 MED ORDER — POLYETHYLENE GLYCOL 3350 17 G PO PACK
17.0000 g | PACK | Freq: Every day | ORAL | Status: DC | PRN
  Administered 2017-04-30: 17 g via ORAL
  Filled 2017-04-27: qty 1

## 2017-04-27 MED ORDER — MORPHINE SULFATE (PF) 2 MG/ML IV SOLN
2.0000 mg | Freq: Once | INTRAVENOUS | Status: AC
  Administered 2017-04-27: 2 mg via INTRAVENOUS

## 2017-04-27 MED ORDER — SODIUM CHLORIDE 3 % IV SOLN
INTRAVENOUS | Status: DC
  Administered 2017-04-27: 50 mL/h via INTRAVENOUS
  Filled 2017-04-27 (×3): qty 500

## 2017-04-27 MED ORDER — LEVOTHYROXINE SODIUM 25 MCG PO TABS: 25.0000 ug | ORAL_TABLET | Freq: Every day | ORAL | Status: DC

## 2017-04-27 MED ORDER — SODIUM CHLORIDE 0.9% FLUSH
3.0000 mL | Freq: Two times a day (BID) | INTRAVENOUS | Status: DC
  Administered 2017-04-27 – 2017-05-01 (×8): 3 mL via INTRAVENOUS

## 2017-04-27 MED ORDER — ENOXAPARIN SODIUM 40 MG/0.4ML ~~LOC~~ SOLN
40.0000 mg | SUBCUTANEOUS | Status: DC
  Administered 2017-04-27 – 2017-04-30 (×4): 40 mg via SUBCUTANEOUS
  Filled 2017-04-27 (×4): qty 0.4

## 2017-04-27 MED ORDER — SODIUM CHLORIDE 0.9 % IV BOLUS (SEPSIS)
500.0000 mL | Freq: Once | INTRAVENOUS | Status: AC
  Administered 2017-04-27: 500 mL via INTRAVENOUS

## 2017-04-27 MED ORDER — ASPIRIN EC 81 MG PO TBEC
81.0000 mg | DELAYED_RELEASE_TABLET | Freq: Every day | ORAL | Status: DC
  Administered 2017-04-28 – 2017-05-01 (×4): 81 mg via ORAL
  Filled 2017-04-27 (×4): qty 1

## 2017-04-27 MED ORDER — MORPHINE SULFATE (PF) 2 MG/ML IV SOLN
INTRAVENOUS | Status: AC
  Administered 2017-04-27: 2 mg via INTRAVENOUS
  Filled 2017-04-27: qty 1

## 2017-04-27 MED ORDER — LOSARTAN POTASSIUM 25 MG PO TABS
25.0000 mg | ORAL_TABLET | Freq: Every day | ORAL | Status: DC
  Administered 2017-04-28 – 2017-05-01 (×4): 25 mg via ORAL
  Filled 2017-04-27 (×4): qty 1

## 2017-04-27 MED ORDER — CARVEDILOL 3.125 MG PO TABS
3.1250 mg | ORAL_TABLET | Freq: Two times a day (BID) | ORAL | Status: DC
  Administered 2017-04-28 – 2017-05-01 (×7): 3.125 mg via ORAL
  Filled 2017-04-27 (×7): qty 1

## 2017-04-27 MED ORDER — DOCUSATE SODIUM 100 MG PO CAPS
100.0000 mg | ORAL_CAPSULE | Freq: Two times a day (BID) | ORAL | Status: DC
  Administered 2017-04-28 – 2017-05-01 (×7): 100 mg via ORAL
  Filled 2017-04-27 (×7): qty 1

## 2017-04-27 MED ORDER — ONDANSETRON HCL 4 MG PO TABS: 4.0000 mg | ORAL_TABLET | Freq: Four times a day (QID) | ORAL | Status: DC | PRN

## 2017-04-27 MED ORDER — OXYCODONE HCL 5 MG PO TABS
5.0000 mg | ORAL_TABLET | ORAL | Status: DC | PRN
  Administered 2017-04-27 – 2017-05-01 (×9): 5 mg via ORAL
  Filled 2017-04-27 (×9): qty 1

## 2017-04-27 MED ORDER — CARVEDILOL 3.125 MG PO TABS: 3.1250 mg | ORAL_TABLET | Freq: Two times a day (BID) | ORAL | 0 refills | Status: AC

## 2017-04-27 MED ORDER — ACETAMINOPHEN 325 MG PO TABS: 650.0000 mg | ORAL_TABLET | Freq: Four times a day (QID) | ORAL | Status: DC | PRN

## 2017-04-27 MED ORDER — ONDANSETRON HCL 4 MG/2ML IJ SOLN: 4.0000 mg | Freq: Four times a day (QID) | INTRAMUSCULAR | Status: DC | PRN

## 2017-04-27 MED ORDER — LEVOTHYROXINE SODIUM 25 MCG PO TABS
25.0000 ug | ORAL_TABLET | Freq: Every day | ORAL | Status: DC
  Administered 2017-04-28 – 2017-05-01 (×4): 25 ug via ORAL
  Filled 2017-04-27 (×4): qty 1

## 2017-04-27 MED ORDER — FUROSEMIDE 10 MG/ML IJ SOLN
40.0000 mg | Freq: Two times a day (BID) | INTRAMUSCULAR | Status: DC
  Administered 2017-04-27 – 2017-04-28 (×2): 40 mg via INTRAVENOUS
  Filled 2017-04-27 (×2): qty 4

## 2017-04-27 MED ORDER — FLUTICASONE PROPIONATE 50 MCG/ACT NA SUSP
2.0000 | Freq: Every day | NASAL | Status: DC | PRN
  Filled 2017-04-27: qty 16

## 2017-04-27 MED ORDER — PANTOPRAZOLE SODIUM 40 MG PO TBEC
40.0000 mg | DELAYED_RELEASE_TABLET | Freq: Every day | ORAL | Status: DC
  Administered 2017-04-28 – 2017-05-01 (×4): 40 mg via ORAL
  Filled 2017-04-27 (×4): qty 1

## 2017-04-27 MED ORDER — ZOLPIDEM TARTRATE 5 MG PO TABS
5.0000 mg | ORAL_TABLET | Freq: Every evening | ORAL | Status: DC | PRN
Start: 2017-04-27 — End: 2017-05-01
  Administered 2017-04-27 – 2017-04-30 (×3): 5 mg via ORAL
  Filled 2017-04-27 (×3): qty 1

## 2017-04-27 MED ORDER — ACETAMINOPHEN 650 MG RE SUPP: 650.0000 mg | Freq: Four times a day (QID) | RECTAL | Status: DC | PRN

## 2017-04-27 MED ORDER — GUAIFENESIN ER 600 MG PO TB12
600.0000 mg | ORAL_TABLET | Freq: Two times a day (BID) | ORAL | Status: DC | PRN
  Administered 2017-04-28 – 2017-04-29 (×3): 600 mg via ORAL
  Filled 2017-04-27 (×3): qty 1

## 2017-04-27 MED ORDER — ALBUTEROL SULFATE (2.5 MG/3ML) 0.083% IN NEBU
2.5000 mg | INHALATION_SOLUTION | RESPIRATORY_TRACT | Status: DC | PRN
Start: 2017-04-27 — End: 2017-05-01
  Administered 2017-04-29 – 2017-04-30 (×2): 2.5 mg via RESPIRATORY_TRACT
  Filled 2017-04-27 (×2): qty 3

## 2017-04-27 NOTE — ED Notes (Signed)
Sling applied to left arm.

## 2017-04-27 NOTE — ED Provider Notes (Signed)
[facility] Emergency Department Provider Note ____________________________________________   First MD Initiated Contact with Patient 04/27/17 1538     (approximate)  I have reviewed the triage vital signs and the nursing notes.   HISTORY  Chief Complaint Fall    HPI Kimberly [name] is a 83 y.o. female with past medical history as noted below who presents from home after an apparent mechanical fall earlier today. Patient states that she fell around noon, and states that she remembers what happened; she states that she "stumbled" and fell onto her side. She reports pain primarily to her left shoulder, and denies any other pain currently. She also states that she has been feeling weak and unwell for the last 1-2 days, with no other focal symptoms.    Past Medical History:  Diagnosis Date  . Allergy    allergic rhinitis  . Chronic combined systolic and diastolic CHF (congestive heart failure) ([place])    a. 01/2014 Echo: EF 50-55%, no rwma, Gr1 DD, triv AI, mild MR;  b. 01/2017 Echo: EF 35%, diff HK, gr2  DD, fibrocalcific AoV w/o AS, mild AI/MR, sev dil LA, PASP 38mmHg.  [place] GERD (gastroesophageal reflux disease)   . Hyperlipidemia   . Hypertension   . Hypothyroid   . Insomnia   . Non-obstructive CAD (coronary artery disease)    a. Non-obstructive dzs by caths in 2001 and 2006;  b. 03/2017 Cath: LM nl, LAD 30p, LCX nl, RCA 75m/d, EF 45-50%.    Patient Active Problem List   Diagnosis Date Noted  . Shortness of breath 04/15/2017  . Chronic systolic heart failure ([place]) 03/11/2017  . AKI (acute kidney injury) ([place]) 03/02/2017  . Orthostatic hypotension 03/02/2017  . Frequent PVCs 03/02/2017  . Frequent urination 12/07/2016  . Low back pain 12/07/2016  . Fall 12/07/2016  . Thoracic compression fracture ([place]) 12/07/2016  . Estrogen deficiency 12/07/2016  . Lower extremity edema 06/19/2016  . Carotid atherosclerosis 04/29/2016  . Medicare annual wellness visit,  initial 05/27/2015  . Routine general medical examination at a health care facility 05/27/2015  . Colon cancer screening 05/27/2015  . Hyposmolality and/or hyponatremia 05/28/2014  . Hypokalemia 05/28/2014  . Hyponatremia 01/31/2014  . Unspecified constipation 01/31/2014  . Left thyroid nodule 08/04/2012  . Obesity 12/04/2011  . Gastritis 12/04/2011  . Heart palpitations 09/01/2011  . Depression 05/26/2011  . Frequent UTI 12/30/2010  . Lower abdominal pain 11/27/2010  . COLONIC POLYPS 04/09/2010  . Anemia 04/09/2010  . HEMORRHOIDS 03/04/2010  . Anxiety 12/10/2009  . PERSONAL HX COLONIC POLYPS 06/19/2008  . IRRITABLE BOWEL SYNDROME 03/28/2008  . 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 GOITER 03/29/2007  . Hypothyroidism 03/29/2007  . Hyperlipidemia 03/29/2007  . Essential hypertension 03/29/2007  . Coronary atherosclerosis 03/29/2007  . LBBB (left bundle branch block) 03/29/2007  . ALLERGIC RHINITIS 03/29/2007  . GERD 03/29/2007  . HYPERGLYCEMIA 03/29/2007    Past Surgical History:  Procedure Laterality Date  . [place] CATH AND CORONARY ANGIOGRAPHY N/A 03/03/2017   Procedure: Left Heart Cath and Coronary Angiography;  Surgeon: Minna Merritts, MD;  Location: [facility];  Service: Cardiovascular;  Laterality: N/A;  . OOPHORECTOMY    . THYROID SURGERY      Prior to Admission medications   Medication Sig Start Date End Date Taking? Authorizing Provider  aspirin 81 MG tablet Take 81 mg  by mouth daily.     Yes [provider]  carvedilol (COREG) 3.125 MG tablet Take 1 tablet (3.125 mg total) by mouth 2 (two) times daily with a meal. Please keep 05/20/17 appt for future refills 04/27/17  Yes Gollan, Kathlene November, MD  cetirizine (ZYRTEC) 10 MG tablet Take 10 mg by mouth daily as needed for allergies.    Yes [provider]  docusate sodium 100 MG CAPS Take 100 mg by mouth 2  (two) times daily. For constipation 02/02/14  Yes Rai, Ripudeep K, MD  esomeprazole (NEXIUM) 40 MG capsule TAKE 1 CAPSULE (40 MG TOTAL) BY MOUTH DAILY. 10/12/16  Yes Tower, Wynelle Fanny, MD  fluticasone (FLONASE) 50 MCG/ACT nasal spray Place 2 sprays into both nostrils daily. 04/15/17  Yes Ria Bush, MD  furosemide (LASIX) 40 MG tablet Take 0.5 tablets (20 mg total) by mouth daily. Patient taking differently: Take 40 mg by mouth daily.  03/29/17  Yes Rogelia Mire, NP  guaiFENesin (MUCINEX) 600 MG 12 hr tablet Take 600 mg by mouth 2 (two) times daily as needed for cough or to loosen phlegm.    Yes [provider]  levothyroxine (SYNTHROID, LEVOTHROID) 25 MCG tablet Take 1 tablet (25 mcg total) by mouth daily. 06/16/16  Yes Tower, Wynelle Fanny, MD  losartan (COZAAR) 25 MG tablet TAKE 1 TABLET BY MOUTH EVERY DAY 03/25/17  Yes Gollan, Kathlene November, MD  Multiple Vitamin (MULTIVITAMIN WITH MINERALS) TABS tablet Take 1 tablet by mouth daily.   Yes [provider]  spironolactone (ALDACTONE) 25 MG tablet TAKE 1 TABLET BY MOUTH EVERY DAY 03/29/17  Yes Gollan, Kathlene November, MD  zolpidem (AMBIEN) 10 MG tablet TAKE 1/2 TO 1 TABLET BY MOUTH DAILY AT BEDTIME AS NEEDED 02/18/17  Yes Tower, Wynelle Fanny, MD    Allergies Ciprofloxacin; Lisinopril; Remeron [mirtazapine]; Sulfonamide derivatives; Tetanus toxoid; Vesicare [solifenacin succinate]; Zoloft [sertraline hcl]; Cephalexin; Elavil [amitriptyline]; and Penicillins  Family History  Problem Relation Age of Onset  . Heart disease Mother   . Hypertension Mother     Social History Social History  Substance Use Topics  . Smoking status: Never Smoker  . Smokeless tobacco: Never Used  . Alcohol use No    Review of Systems  Constitutional: No fever. Eyes: No visual changes. ENT: No sore throat. Cardiovascular: Denies chest pain. Respiratory: Denies shortness of breath. Gastrointestinal: No vomiting or diarrhea.  Genitourinary: Negative for  dysuria.  Musculoskeletal: Positive for left shoulder pain. Skin: Negative for rash. Neurological: Negative for headaches.   ____________________________________________   PHYSICAL EXAM:  VITAL SIGNS: ED Triage Vitals  Enc Vitals Group     BP 04/27/17 1521 140/64     Pulse Rate 04/27/17 1521 70     Resp 04/27/17 1521 17     Temp 04/27/17 1521 98.5 F (36.9 C)     Temp Source 04/27/17 1521 Oral     SpO2 04/27/17 1521 91 %     Weight 04/27/17 1523 180 lb (81.6 kg)     Height 04/27/17 1523 5\' 3"  (1.6 m)     Head Circumference --      Peak Flow --      Pain Score 04/27/17 1522 0     Pain Loc --      Pain Edu? --      Excl. in [place]? --     Constitutional: Alert and oriented. No acute distress.  Eyes: Conjunctivae are normal.  EOMI.  PERRLA.  Head: 3cm abrasion to occipital  area, otherwise atraumatic.  Nose: No congestion/rhinnorhea. Mouth/Throat: Mucous membranes are dry.  Neck: Normal range of motion.  No focal cspine tenderness.  Cardiovascular: Normal rate, regular rhythm. Grossly normal heart sounds.  Good peripheral circulation. Respiratory: Normal respiratory effort.  No retractions. Lungs CTAB. Gastrointestinal: Soft and nontender. No distention.  Genitourinary: No CVA tenderness. Musculoskeletal: No lower extremity edema.  Extremities warm and well perfused.  FROM at all joints of RUE and bilat LEs with no bony tenderness and no deformity.  LUE with dec ROM to L shoulder and pain to proximal humerus but no deformity.  2+ rad and DP pulses bilat.  Pt reports possible tenderness to Tspine but there is no stepoff or crepitus.  Neurologic:  Normal speech and language. No gross focal neurologic deficits are appreciated.  5/5 motor strength and intact sensation to all 4 extremities.  Skin:  Skin is warm and dry. No rash noted. Psychiatric: Mood and affect are normal. Speech and behavior are normal.  ____________________________________________   LABS (all labs ordered are  listed, but only abnormal results are displayed)  Labs Reviewed  COMPREHENSIVE METABOLIC PANEL - Abnormal; Notable for the following:       Result Value   Sodium 114 (*)    Chloride 79 (*)    Creatinine, Ser 1.11 (*)    Calcium 7.9 (*)    Total Protein 5.9 (*)    Albumin 3.3 (*)    ALT 12 (*)    GFR calc non Af Amer 43 (*)    GFR calc Af Amer 50 (*)    All other components within normal limits  CBC WITH DIFFERENTIAL/PLATELET - Abnormal; Notable for the following:    RBC 3.18 (*)    Hemoglobin 9.5 (*)    HCT 27.0 (*)    Lymphs Abs 0.7 (*)    All other components within normal limits  TROPONIN I - Abnormal; Notable for the following:    Troponin I 0.04 (*)    All other components within normal limits  URINALYSIS, COMPLETE (UACMP) WITH MICROSCOPIC - Abnormal; Notable for the following:    Color, Urine YELLOW (*)    APPearance CLEAR (*)    Protein, ur 30 (*)    Squamous Epithelial / LPF 0-5 (*)    All other components within normal limits  SODIUM - Abnormal; Notable for the following:    Sodium 113 (*)    All other components within normal limits  SODIUM - Abnormal; Notable for the following:    Sodium 115 (*)    All other components within normal limits  OSMOLALITY - Abnormal; Notable for the following:    Osmolality 237 (*)    All other components within normal limits  OSMOLALITY, URINE - Abnormal; Notable for the following:    Osmolality, Ur 233 (*)    All other components within normal limits  SODIUM, URINE, RANDOM  SODIUM   ____________________________________________  EKG  ED ECG REPORT I, Arta Silence, the attending physician, personally viewed and interpreted this ECG.  Date: 04/27/2017 EKG Time: 1601 Rate: 65 Rhythm: atrial fibrillation with PVCs QRS Axis: L axis Intervals: LBBB ST/T Wave abnormalities: no acute findings Narrative Interpretation: no evidence of acute ischemia; no significant change when compared to EKG of  03/19/2017  ____________________________________________  RADIOLOGY   CT head: No acute findings CT C/T/L spine: T12 burst fracture CXR: mildly increased pulmonary interstitial markings L shoulder and humerus: minimally displaced proximal humerus fracture   ____________________________________________   PROCEDURES  Procedure(s)  performed: No    Critical Care performed: No ____________________________________________   INITIAL IMPRESSION / ASSESSMENT AND PLAN / ED COURSE  Pertinent labs & imaging results that were available during my care of the patient were reviewed by me and considered in my medical decision making (see chart for details).  83 year old female with past medical history as noted presents after a fall that she remembers to be mechanical, primarily complaining of left shoulder pain. patient also reports feeling generalized weakness over the last 1-2 days. In the ED, vital signs are normal, patient is relatively comfortable appearing, and exam is as described.    Plan:   1. Eval for injury with CT head due to visible trauma, C/T/L spine based on age and unreliable exam, and XRs of L shoulder and humerus.  Neuro/vascular intact in all extremities.  No evidence of pelvic or hip injuries.  2. Eval for 1-2 days of gen weakness, for which ddx is infection, dehydration, other metabolic, medication related, less likely cardiac.  Plan: basic labs, infection workup, troponin, and reassess.  Although pt has hx of CHF she appears dehydrated; will give small fluid bolus.      ----------------------------------------- 7:29 PM on 04/27/2017 -----------------------------------------  Workup reveals significant hyponatremia which appears to be acute on chronic. He isn't started on hypertonic saline with repeat Na levels per protocol.  begin findings on imaging include left proximal humerus fracture as well as a subacute T11 burst fracture that was seen on prior imaging in  July. I discussed case with Dr. Wendall Mola from orthopedics who recommends shoulder immobilizer for the proximal humerus and I discussed the spinal fracture with Dr. Lacinda Axon from neurosurgery who recommends MRI in AM states that he will eval the patient.  Pt continues to have 5/5 motor strength and normal sensation in LEs bilat and normal rectal tone.  No other emergent recommendations.  Patient signed out to hospitalist for admission.     ____________________________________________   FINAL CLINICAL IMPRESSION(S) / ED DIAGNOSES  Final diagnoses:  Hyponatremia  Closed fracture of proximal end of left humerus, unspecified fracture morphology, initial encounter  Closed stable burst fracture of twelfth thoracic vertebra with routine healing, subsequent encounter      NEW MEDICATIONS STARTED DURING THIS VISIT:  New Prescriptions   No medications on file     Note:  This document was prepared using Dragon voice recognition software and may include unintentional dictation errors.    Arta Silence, MD 04/27/17 [id number]

## 2017-04-27 NOTE — ED Notes (Signed)
Anda Kraft, RN at bedside for IV [place].

## 2017-04-27 NOTE — H&P (Signed)
Kimberly [name] NAME: Marcos Peloso    MR#:  [id number]  DATE OF BIRTH:  [birth year]  DATE OF ADMISSION:  04/27/2017  PRIMARY CARE PHYSICIAN: Tower, Wynelle Fanny, MD   REQUESTING/REFERRING PHYSICIAN: Dr. Cherylann Banas  CHIEF COMPLAINT:   Chief Complaint  Patient presents with  . Fall    HISTORY OF PRESENT ILLNESS:  Kimberly [name]  is a 83 y.o. female with a known history of chronic systolic CHF with ejection fraction of 35%, hypertension, anemia of chronic disease presents to the emergency room after patient had episode of syncope and was found fallen on the floor by family.She had pain in left shoulder area. Here in the emergency room patient has been found to have left humerus fracture, sodium severely low at 113. Orthopedics consulted and suggested a sling being placed. Hypertonic normal saline started. Patient's sodium has been low up to 126 reviewing recent blood numbers from primary care physician. Her weight during last discharge was 170 pounds. Today she is 180 pounds. Her Lasix was recently increased from 20 mg once a day to 40 mg once a day by PCP on the 19th.  PAST MEDICAL HISTORY:   Past Medical History:  Diagnosis Date  . Allergy    allergic rhinitis  . Chronic combined systolic and diastolic CHF (congestive heart failure) ([place])    a. 01/2014 Echo: EF 50-55%, no rwma, Gr1 DD, triv AI, mild MR;  b. 01/2017 Echo: EF 35%, diff HK, gr2  DD, fibrocalcific AoV w/o AS, mild AI/MR, sev dil LA, PASP 33mmHg.  [place] GERD (gastroesophageal reflux disease)   . Hyperlipidemia   . Hypertension   . Hypothyroid   . Insomnia   . Non-obstructive CAD (coronary artery disease)    a. Non-obstructive dzs by caths in 2001 and 2006;  b. 03/2017 Cath: LM nl, LAD 30p, LCX nl, RCA 68m/d, EF 45-50%.    PAST SURGICAL HISTORY:   Past Surgical History:  Procedure Laterality Date  . [place] CATH AND  CORONARY ANGIOGRAPHY N/A 03/03/2017   Procedure: Left Heart Cath and Coronary Angiography;  Surgeon: Minna Merritts, MD;  Location: [facility];  Service: Cardiovascular;  Laterality: N/A;  . OOPHORECTOMY    . THYROID SURGERY      SOCIAL HISTORY:   Social History  Substance Use Topics  . Smoking status: Never Smoker  . Smokeless tobacco: Never Used  . Alcohol use No    FAMILY HISTORY:   Family History  Problem Relation Age of Onset  . Heart disease Mother   . Hypertension Mother     DRUG ALLERGIES:   Allergies  Allergen Reactions  . Ciprofloxacin Other (See Comments)    REACTION: burning of mouth and skin  . Lisinopril Cough  . Remeron [Mirtazapine] Other (See Comments)    constipation  . Sulfonamide Derivatives Other (See Comments)    REACTION: malaise  . Tetanus Toxoid Swelling  . Vesicare [Solifenacin Succinate] Other (See Comments)    Blurred vision and weakness  . Zoloft [Sertraline Hcl] Nausea And Vomiting  . Cephalexin Rash and Other (See Comments)    REACTION: rash and burning sensation to skin on arms and legs with itching.  Madelin Headings [Amitriptyline] Palpitations  . Penicillins Rash and Other (See Comments)    Has patient had a PCN reaction causing immediate rash, facial/tongue/throat swelling, SOB or  lightheadedness with hypotension: Unknown Has patient had a PCN reaction causing severe rash involving mucus membranes or skin necrosis: Unknown Has patient had a PCN reaction that required hospitalization: Unknown Has patient had a PCN reaction occurring within the last 10 years: No If all of the above answers are "NO", then may proceed with Cephalosporin use. Has since take Augmentin with no problem    REVIEW OF SYSTEMS:   Review of Systems  Constitutional: Positive for malaise/fatigue. Negative for chills and fever.  HENT: Negative for sore throat.   Eyes: Negative for blurred vision, double vision and pain.  Respiratory: Negative for cough,  hemoptysis, shortness of breath and wheezing.   Cardiovascular: Negative for chest pain, palpitations, orthopnea and leg swelling.  Gastrointestinal: Negative for abdominal pain, constipation, diarrhea, heartburn, nausea and vomiting.  Genitourinary: Negative for dysuria and hematuria.  Musculoskeletal: Positive for falls and joint pain. Negative for back pain.  Skin: Negative for rash.  Neurological: Positive for loss of consciousness and weakness. Negative for sensory change, speech change, focal weakness and headaches.  Endo/Heme/Allergies: Does not bruise/bleed easily.  Psychiatric/Behavioral: Negative for depression. The patient is not nervous/anxious.     MEDICATIONS AT HOME:   Prior to Admission medications   Medication Sig Start Date End Date Taking? Authorizing Provider  aspirin 81 MG tablet Take 81 mg by mouth daily.     Yes [provider]  carvedilol (COREG) 3.125 MG tablet Take 1 tablet (3.125 mg total) by mouth 2 (two) times daily with a meal. Please keep 05/20/17 appt for future refills 04/27/17  Yes Gollan, Kathlene November, MD  cetirizine (ZYRTEC) 10 MG tablet Take 10 mg by mouth daily as needed for allergies.    Yes [provider]  docusate sodium 100 MG CAPS Take 100 mg by mouth 2 (two) times daily. For constipation 02/02/14  Yes Rai, Ripudeep K, MD  esomeprazole (NEXIUM) 40 MG capsule TAKE 1 CAPSULE (40 MG TOTAL) BY MOUTH DAILY. 10/12/16  Yes Tower, Wynelle Fanny, MD  fluticasone (FLONASE) 50 MCG/ACT nasal spray Place 2 sprays into both nostrils daily. 04/15/17  Yes Ria Bush, MD  furosemide (LASIX) 40 MG tablet Take 0.5 tablets (20 mg total) by mouth daily. Patient taking differently: Take 40 mg by mouth daily.  03/29/17  Yes Rogelia Mire, NP  guaiFENesin (MUCINEX) 600 MG 12 hr tablet Take 600 mg by mouth 2 (two) times daily as needed for cough or to loosen phlegm.    Yes [provider]  levothyroxine (SYNTHROID, LEVOTHROID) 25 MCG tablet Take 1  tablet (25 mcg total) by mouth daily. 06/16/16  Yes Tower, Wynelle Fanny, MD  losartan (COZAAR) 25 MG tablet TAKE 1 TABLET BY MOUTH EVERY DAY 03/25/17  Yes Gollan, Kathlene November, MD  Multiple Vitamin (MULTIVITAMIN WITH MINERALS) TABS tablet Take 1 tablet by mouth daily.   Yes [provider]  spironolactone (ALDACTONE) 25 MG tablet TAKE 1 TABLET BY MOUTH EVERY DAY 03/29/17  Yes Gollan, Kathlene November, MD  zolpidem (AMBIEN) 10 MG tablet TAKE 1/2 TO 1 TABLET BY MOUTH DAILY AT BEDTIME AS NEEDED 02/18/17  Yes Tower, Wynelle Fanny, MD     VITAL SIGNS:  Blood pressure 139/65, pulse (!) 53, temperature 98.5 F (36.9 C), temperature source Oral, resp. rate (!) 23, height 5\' 3"  (1.6 m), weight 81.6 kg (180 lb), last menstrual period 08/03/1978, SpO2 100 %.  PHYSICAL EXAMINATION:  Physical Exam  GENERAL:  83 y.o.-year-old patient lying in the bed . In distress due to  pain EYES: Pupils equal, round, reactive to light and accommodation. No scleral icterus. Extraocular muscles intact.  HEENT: Head atraumatic, normocephalic. Oropharynx and nasopharynx clear. No oropharyngeal erythema, moist oral mucosa  NECK:  Supple, no jugular venous distention. No thyroid enlargement, no tenderness.  LUNGS: bibasilar crackles CARDIOVASCULAR: S1, S2 normal. No murmurs, rubs, or gallops.  ABDOMEN: Soft, nontender, nondistended. Bowel sounds present. No organomegaly or mass.  EXTREMITIES: No, cyanosis, or clubbing. + 2 pedal & radial pulses b/l.  Mild lower extremity edema. Tenderness left upper extremity with bruising NEUROLOGIC: Cranial nerves II through XII are intact. No focal Motor or sensory deficits appreciated b/l PSYCHIATRIC: The patient is alert and oriented x 3. Good affect.  SKIN: No obvious rash, lesion, or ulcer.   LABORATORY PANEL:   CBC  Recent Labs Lab 04/27/17 1612  WBC 7.0  HGB 9.5*  HCT 27.0*  PLT 160    ------------------------------------------------------------------------------------------------------------------  Chemistries   Recent Labs Lab 04/27/17 1612  04/27/17 1903  NA 114*  < > 115*  K 3.9  --   --   CL 79*  --   --   CO2 24  --   --   GLUCOSE 95  --   --   BUN 18  --   --   CREATININE 1.11*  --   --   CALCIUM 7.9*  --   --   AST 17  --   --   ALT 12*  --   --   ALKPHOS 84  --   --   BILITOT 0.4  --   --   < > = values in this interval not displayed. ------------------------------------------------------------------------------------------------------------------  Cardiac Enzymes  Recent Labs Lab 04/27/17 1612  TROPONINI 0.04*   ------------------------------------------------------------------------------------------------------------------  RADIOLOGY:  Ct Head Wo Contrast  Result Date: 04/27/2017 CLINICAL DATA:  Fall this morning, found down. Bruising along the back of the head. EXAM: CT HEAD WITHOUT CONTRAST CT CERVICAL SPINE WITHOUT CONTRAST TECHNIQUE: Multidetector CT imaging of the head and cervical spine was performed following the standard protocol without intravenous contrast. Multiplanar CT image reconstructions of the cervical spine were also generated. COMPARISON:  04/28/2016 FINDINGS: CT HEAD FINDINGS Brain: Stable calcifications in the lentiform nuclei bilaterally, likely physiologic. Otherwise, the brainstem, cerebellum, cerebral peduncles, thalami, basal ganglia, basilar cisterns, and ventricular system appear within normal limits. Periventricular white matter and corona radiata hypodensities favor chronic ischemic microvascular white matter disease. No intracranial hemorrhage, mass lesion, or acute CVA. Vascular: There is atherosclerotic calcification of the cavernous carotid arteries bilaterally. Skull: Unremarkable Sinuses/Orbits: Complete opacification of the left sphenoid sinus. Chronic ethmoid sinusitis. Other: No supplemental non-categorized  findings. CT CERVICAL SPINE FINDINGS Alignment: Mild reversal the normal cervical lordosis. 1.5 mm degenerative anterolisthesis at C3-4. Skull base and vertebrae: Spurring and loss of articular space at the anterior C1- 2 articulation. Degenerative loss of intervertebral disc height at C3-4 and C4-5. No acute cervical spine fracture. Soft tissues and spinal canal: Confluent nodularity in the somewhat and large left thyroid gland. Right thyroid lobe is absent. Rightward deviation of the trachea. Disc levels: Uncinate and facet spurring cause at least moderate right foraminal stenosis at C4-5, and borderline left foraminal stenosis at C3-4 and C4-5. Upper chest: Unremarkable Other: No supplemental non-categorized findings. IMPRESSION: 1. No acute intracranial findings are acute cervical spine findings. 2. Paranasal sinusitis with complete opacification of the left sphenoid sinus and chronic because all thickening in the ethmoid air cells. 3. Cervical spondylosis, causing right foraminal impingement at C4-5.  4. Prior right thyroid lobe resection. Nodular and enlarged left thyroid lobe. This has been previously worked up for example on prior thyroid ultrasound 08/04/2012. Electronically Signed   By: Van Clines M.D.   On: 04/27/2017 17:12   Ct Cervical Spine Wo Contrast  Result Date: 04/27/2017 CLINICAL DATA:  Fall this morning, found down. Bruising along the back of the head. EXAM: CT HEAD WITHOUT CONTRAST CT CERVICAL SPINE WITHOUT CONTRAST TECHNIQUE: Multidetector CT imaging of the head and cervical spine was performed following the standard protocol without intravenous contrast. Multiplanar CT image reconstructions of the cervical spine were also generated. COMPARISON:  04/28/2016 FINDINGS: CT HEAD FINDINGS Brain: Stable calcifications in the lentiform nuclei bilaterally, likely physiologic. Otherwise, the brainstem, cerebellum, cerebral peduncles, thalami, basal ganglia, basilar cisterns, and ventricular  system appear within normal limits. Periventricular white matter and corona radiata hypodensities favor chronic ischemic microvascular white matter disease. No intracranial hemorrhage, mass lesion, or acute CVA. Vascular: There is atherosclerotic calcification of the cavernous carotid arteries bilaterally. Skull: Unremarkable Sinuses/Orbits: Complete opacification of the left sphenoid sinus. Chronic ethmoid sinusitis. Other: No supplemental non-categorized findings. CT CERVICAL SPINE FINDINGS Alignment: Mild reversal the normal cervical lordosis. 1.5 mm degenerative anterolisthesis at C3-4. Skull base and vertebrae: Spurring and loss of articular space at the anterior C1- 2 articulation. Degenerative loss of intervertebral disc height at C3-4 and C4-5. No acute cervical spine fracture. Soft tissues and spinal canal: Confluent nodularity in the somewhat and large left thyroid gland. Right thyroid lobe is absent. Rightward deviation of the trachea. Disc levels: Uncinate and facet spurring cause at least moderate right foraminal stenosis at C4-5, and borderline left foraminal stenosis at C3-4 and C4-5. Upper chest: Unremarkable Other: No supplemental non-categorized findings. IMPRESSION: 1. No acute intracranial findings are acute cervical spine findings. 2. Paranasal sinusitis with complete opacification of the left sphenoid sinus and chronic because all thickening in the ethmoid air cells. 3. Cervical spondylosis, causing right foraminal impingement at C4-5. 4. Prior right thyroid lobe resection. Nodular and enlarged left thyroid lobe. This has been previously worked up for example on prior thyroid ultrasound 08/04/2012. Electronically Signed   By: Van Clines M.D.   On: 04/27/2017 17:12   Ct Thoracic Spine Wo Contrast  Result Date: 04/27/2017 CLINICAL DATA:  Trip and fall at home. Possible multiple recent falls. EXAM: CT THORACIC AND LUMBAR SPINE WITHOUT CONTRAST TECHNIQUE: Multidetector CT imaging of the  thoracic and lumbar spine was performed without contrast. Multiplanar CT image reconstructions were also generated. COMPARISON:  Lumbar spine radiographs November 29, 2016 and chest radiograph July 27 , 2018 FINDINGS: CT THORACIC SPINE FINDINGS ALIGNMENT: Maintained thoracic lordosis. No malalignment. VERTEBRAE: Moderate to severe T11 burst fracture with 50-75% height loss, 2 mm retropulsed bony fragments. Fracture was present July 2018, however fracture line present along superior endplate. The remaining thoracic vertebral bodies intact. Intervertebral disc heights generally preserved, multilevel intradiscal mineralization. Multilevel endplate sclerosis and spurring. Osteopenia. Scattered old Schmorl's nodes. Multilevel mild facet arthropathy. PARASPINAL AND OTHER SOFT TISSUES: Partially imaged thyromegaly displacing the trachea to the RIGHT. Calcific atherosclerosis aortic arch. Cardiomegaly. Small RIGHT pleural effusion. DISC LEVELS: Mild canal stenosis T10-11 due to retropulsed bony fragments. No osseous neural foraminal narrowing at any level. CT LUMBAR SPINE FINDINGS SEGMENTATION: For the purposes of this report the last well-formed intervertebral disc space is reported as L5-S1. ALIGNMENT: Maintained lumbar lordosis. No malalignment. VERTEBRAE: Vertebral bodies and posterior elements are intact. Intervertebral disc heights preserved, multilevel vacuum disc. No destructive bony lesions.  Osteopenia. PARASPINAL AND OTHER SOFT TISSUES: Included prevertebral and paraspinal soft tissues are unremarkable. DISC LEVELS: L1-2: No disc bulge, canal stenosis nor neural foraminal narrowing. L2-3: Small LEFT extraforaminal disc protrusion without canal stenosis or neural foraminal narrowing. L3-4: Moderate LEFT extraforaminal disc protrusion without canal stenosis. Moderate LEFT neural foraminal narrowing. L4-5: Small broad-based disc bulge, mild facet arthropathy and ligamentum flavum redundancy without canal stenosis or  neural foraminal narrowing. L5-S1: Small broad-based disc bulge, mild facet arthropathy and ligamentum flavum redundancy without canal stenosis or neural foraminal narrowing. IMPRESSION: CT THORACIC SPINE IMPRESSION 1. Subacute T11 moderate to severe burst fracture resulting in mild canal stenosis. No malalignment. 2. Osteopenia. 3. Thyromegaly displacing the trachea to the RIGHT, incompletely characterized. CT LUMBAR SPINE IMPRESSION 1. No fracture or malalignment. 2. Osteopenia. 3. L3-4 disc protrusion results in moderate LEFT L3-4 neural foraminal narrowing. No canal stenosis at any level. Aortic Atherosclerosis (ICD10-I70.0). Electronically Signed   By: Elon Alas M.D.   On: 04/27/2017 17:06   Ct Lumbar Spine Wo Contrast  Result Date: 04/27/2017 CLINICAL DATA:  Trip and fall at home. Possible multiple recent falls. EXAM: CT THORACIC AND LUMBAR SPINE WITHOUT CONTRAST TECHNIQUE: Multidetector CT imaging of the thoracic and lumbar spine was performed without contrast. Multiplanar CT image reconstructions were also generated. COMPARISON:  Lumbar spine radiographs November 29, 2016 and chest radiograph July 27 , 2018 FINDINGS: CT THORACIC SPINE FINDINGS ALIGNMENT: Maintained thoracic lordosis. No malalignment. VERTEBRAE: Moderate to severe T11 burst fracture with 50-75% height loss, 2 mm retropulsed bony fragments. Fracture was present July 2018, however fracture line present along superior endplate. The remaining thoracic vertebral bodies intact. Intervertebral disc heights generally preserved, multilevel intradiscal mineralization. Multilevel endplate sclerosis and spurring. Osteopenia. Scattered old Schmorl's nodes. Multilevel mild facet arthropathy. PARASPINAL AND OTHER SOFT TISSUES: Partially imaged thyromegaly displacing the trachea to the RIGHT. Calcific atherosclerosis aortic arch. Cardiomegaly. Small RIGHT pleural effusion. DISC LEVELS: Mild canal stenosis T10-11 due to retropulsed bony fragments. No  osseous neural foraminal narrowing at any level. CT LUMBAR SPINE FINDINGS SEGMENTATION: For the purposes of this report the last well-formed intervertebral disc space is reported as L5-S1. ALIGNMENT: Maintained lumbar lordosis. No malalignment. VERTEBRAE: Vertebral bodies and posterior elements are intact. Intervertebral disc heights preserved, multilevel vacuum disc. No destructive bony lesions. Osteopenia. PARASPINAL AND OTHER SOFT TISSUES: Included prevertebral and paraspinal soft tissues are unremarkable. DISC LEVELS: L1-2: No disc bulge, canal stenosis nor neural foraminal narrowing. L2-3: Small LEFT extraforaminal disc protrusion without canal stenosis or neural foraminal narrowing. L3-4: Moderate LEFT extraforaminal disc protrusion without canal stenosis. Moderate LEFT neural foraminal narrowing. L4-5: Small broad-based disc bulge, mild facet arthropathy and ligamentum flavum redundancy without canal stenosis or neural foraminal narrowing. L5-S1: Small broad-based disc bulge, mild facet arthropathy and ligamentum flavum redundancy without canal stenosis or neural foraminal narrowing. IMPRESSION: CT THORACIC SPINE IMPRESSION 1. Subacute T11 moderate to severe burst fracture resulting in mild canal stenosis. No malalignment. 2. Osteopenia. 3. Thyromegaly displacing the trachea to the RIGHT, incompletely characterized. CT LUMBAR SPINE IMPRESSION 1. No fracture or malalignment. 2. Osteopenia. 3. L3-4 disc protrusion results in moderate LEFT L3-4 neural foraminal narrowing. No canal stenosis at any level. Aortic Atherosclerosis (ICD10-I70.0). Electronically Signed   By: Elon Alas M.D.   On: 04/27/2017 17:06   Dg Chest Portable 1 View  Result Date: 04/27/2017 CLINICAL DATA:  Status post fall today.  Weakness. EXAM: PORTABLE CHEST 1 VIEW COMPARISON:  March 01, 2017 FINDINGS: The mediastinal contour is stable. The heart  size is enlarged. Mild diffuse increased pulmonary interstitium is identified  bilaterally. Chronic elevation of right hemidiaphragm is noted. There is no focal pneumonia or pleural effusion. The bony structures are stable. IMPRESSION: Mild increased pulmonary interstitium bilaterally which can be seen in mild interstitial edema. Cardiomegaly. Electronically Signed   By: Abelardo Diesel M.D.   On: 04/27/2017 17:10   Dg Shoulder Left  Result Date: 04/27/2017 CLINICAL DATA:  Status post fall today, pain of left shoulder. EXAM: LEFT SHOULDER - 2+ VIEW COMPARISON:  None. FINDINGS: There is mild displaced fracture of the proximal left humerus at the humeral neck. The visualized ribs and left lung are normal. IMPRESSION: Displaced fracture of proximal left humerus. Electronically Signed   By: Abelardo Diesel M.D.   On: 04/27/2017 17:11   Dg Humerus Left  Result Date: 04/27/2017 CLINICAL DATA:  Status post fall today with left shoulder pain. EXAM: LEFT HUMERUS - 2+ VIEW COMPARISON:  None. FINDINGS: There is displaced fracture of the proximal left humerus. There is no dislocation. IMPRESSION: Fracture proximal left humerus. Electronically Signed   By: Abelardo Diesel M.D.   On: 04/27/2017 17:12     IMPRESSION AND PLAN:   * Severe hyponatremia, hypervolemic Crackles on examination with chest x-ray showing pulmonary edema. Also patient's weight is at 180 pounds today and was 170 pounds during discharged last admission. We'll stop hypertonic saline. Start patient on Lasix.If there is no improvement will need to start Tolvaptan.discussed with Dr. Juleen China. Serial sodiums We will check urine sodium and creatinine. Check TSH  *  Acute on chronic systolic congestive heart failure. Please see about  * left humerus fracture. Consult orthopedics. Place sling. Pain medications added.  * anemia of chronic disease is stable  * DVT prophylaxis with Lovenox  All the records are reviewed and case discussed with ED provider. Management plans discussed with the patient, family and they are in  agreement.  CODE STATUS: DNR  TOTAL CC TIME TAKING CARE OF THIS PATIENT: 45 minutes.   Hillary Bow R M.D on 04/27/2017 at 8:11 PM  Between 7am to 6pm - Pager - [phone]  After 6pm go to [email or web address] - password [email or web address] [facility]  Office  [phone]  CC: Primary care physician; Tower, Wynelle Fanny, MD  Note: This dictation was prepared with Dragon dictation along with smaller phrase technology. Any transcriptional errors that result from this process are unintentional.

## 2017-04-27 NOTE — ED Triage Notes (Signed)
Patient presents to ED via [facility] post fall. Family called EMS this morning when they found patient on the ground of her home. Patient lives alone. Patient states she remembers the fall and got tripped up and lost balance. Patient report left shoulder pain with movement. A&O x4.

## 2017-04-27 NOTE — ED Notes (Signed)
Patient transported to CT at this time. 

## 2017-04-27 NOTE — Progress Notes (Signed)
Patient arrived to 2A Room 242. Patient denies pain and all questions answered. Patient oriented to unit and Fall Safety Plan signed. Skin assessment completed with Lexi RN and scattered ecchymosis noted as well as a skin growth on the right lower abdomen. A&Ox4, VSS, and NSR on verified tele-box #[id number]. Nursing staff will continue to monitor for any changes in patient status. Earleen Reaper, RN

## 2017-04-27 NOTE — ED Notes (Signed)
Seizure pads placed to bilateral side rails

## 2017-04-27 NOTE — Telephone Encounter (Signed)
Patient did not show for her [facility] appointment on 04/27/17. Will attempt to reschedule.

## 2017-04-28 LAB — BASIC METABOLIC PANEL
ANION GAP: 8 (ref 5–15)
ANION GAP: 8 (ref 5–15)
BUN: 17 mg/dL (ref 6–20)
BUN: 16 mg/dL (ref 6–20)
CALCIUM: 7.6 mg/dL — AB (ref 8.9–10.3)
CHLORIDE: 85 mmol/L — AB (ref 101–111)
CO2: 25 mmol/L (ref 22–32)
CO2: 26 mmol/L (ref 22–32)
Calcium: 7.6 mg/dL — ABNORMAL LOW (ref 8.9–10.3)
Chloride: 86 mmol/L — ABNORMAL LOW (ref 101–111)
Creatinine, Ser: 1.15 mg/dL — ABNORMAL HIGH (ref 0.44–1.00)
Creatinine, Ser: 0.97 mg/dL (ref 0.44–1.00)
GFR calc Af Amer: 48 mL/min — ABNORMAL LOW (ref >60)
GFR, EST AFRICAN AMERICAN: 59 mL/min — AB (ref >60)
GFR, EST NON AFRICAN AMERICAN: 42 mL/min — AB (ref >60)
GFR, EST NON AFRICAN AMERICAN: 51 mL/min — AB (ref >60)
GLUCOSE: 80 mg/dL (ref 65–99)
GLUCOSE: 125 mg/dL — AB (ref 65–99)
POTASSIUM: 3.4 mmol/L — AB (ref 3.5–5.1)
Potassium: 3.4 mmol/L — ABNORMAL LOW (ref 3.5–5.1)
Sodium: 118 mmol/L — CL (ref 135–145)
Sodium: 120 mmol/L — ABNORMAL LOW (ref 135–145)

## 2017-04-28 LAB — CBC
HEMATOCRIT: 25.3 % — AB (ref 35.0–47.0)
HEMOGLOBIN: 8.8 g/dL — AB (ref 12.0–16.0)
MCH: 29 pg (ref 26.0–34.0)
MCHC: 34.6 g/dL (ref 32.0–36.0)
MCV: 83.7 fL (ref 80.0–100.0)
Platelets: 149 10*3/uL — ABNORMAL LOW (ref 150–440)
RBC: 3.03 MIL/uL — ABNORMAL LOW (ref 3.80–5.20)
RDW: 14 % (ref 11.5–14.5)
WBC: 5.1 10*3/uL (ref 3.6–11.0)

## 2017-04-28 LAB — SODIUM: Sodium: 121 mmol/L — ABNORMAL LOW (ref 135–145)

## 2017-04-28 MED ORDER — POTASSIUM CHLORIDE CRYS ER 20 MEQ PO TBCR
20.0000 meq | EXTENDED_RELEASE_TABLET | Freq: Once | ORAL | Status: AC
  Administered 2017-04-28: 20 meq via ORAL
  Filled 2017-04-28: qty 1

## 2017-04-28 MED ORDER — TOLVAPTAN 15 MG PO TABS
15.0000 mg | ORAL_TABLET | Freq: Every day | ORAL | Status: DC
  Administered 2017-04-28 – 2017-04-29 (×2): 15 mg via ORAL
  Filled 2017-04-28 (×3): qty 1

## 2017-04-28 NOTE — Care Management (Signed)
S[poke with patient's daughter Maudie Mercury.  Patient has been falling "more and more" at home.  Patient lives alone- Maudie Mercury goes to see patient after work  around 5:30 and leaves at 7-8pm.  Patient is spending most of her time alone.  CM provided med alert brochure last admission in July but resource was not pursued.  Maudie Mercury says patient can not live alone anymore and her memory is getting bad- patient repeats herself a lot and just feels it is not safe for patient anymore.  CM provided Kim with list of private care agencies during last admission and Kim relayed that the cost of 19.75 per hour is too high.  Discussed alternatives- assisted living - spend down all assets  then apply for medicaid - or pay for in home caregivers. Physical therapy is recommending skilled nursing placement.  Patient has an acute fracture left humerus which will be managed non surgically.  kim does not want patient to hear her talking about placement.  CM emphasized that patient must be in agreement to go to a facility.  Provided Kim with resources for in home caregivers and assisted living facilities.

## 2017-04-28 NOTE — Evaluation (Signed)
Physical Therapy Evaluation Patient Details Name: Kimberly [name] MRN: [id number] DOB: November 16, 1927 Today's Date: 04/28/2017   History of Present Illness  Pt is a 83 y.o.femalewith a known history of chronic systolic CHF with ejection fraction of 35%, hypertension, anemia of chronic disease presents to the emergency room after patient had episode of syncope and was found fallen on the floor by family.  She had pain in left shoulder area. Here in the emergency room patient has been found to have left humerus fracture, sodium severely low at 113. Orthopedics consulted and suggested non-surgical intervention with a sling placed.  Per neuro pt also has a h/o T11 burst fracture with back pain that has worsened minimally since recent fall.    Clinical Impression  Pt presents with deficits in strength, transfers, mobility, gait, balance, and activity tolerance.  Pt required Mod A for bed mobility tasks with log roll technique education and practice provided.  Pt required Min A with sit to/from stand transfers with mod verbal cues for sequencing and to prevent pt from trying to use LUE.  Pt able to amb 1 x 5' with a HW and 1 x 5' with a SBQC each with CGA/Min A for stability with short B step length and slow cadence.  No advantage of one AD over the other noted during this session.  SpO2 dropped from baseline of 91-92% on room air to 86-87% after amb, nursing notified and requested pt to be returned to 1LO2/min.  Pt will benefit from PT services in a SNF setting upon discharge to safely address above deficits for decreased caregiver assistance and fall risk and return to PLOF.      Follow Up Recommendations SNF    Equipment Recommendations  Other (comment) (TBD at next venue of care; pt will require HW vs a cane but unsure of most appropriate type at this time)    Recommendations for Other Services       Precautions / Restrictions Precautions Precautions: Fall Required Braces or Orthoses: Sling;Other  Brace/Splint Other Brace/Splint: TLSO brace when upright Restrictions Weight Bearing Restrictions: Yes LUE Weight Bearing: Non weight bearing      Mobility  Bed Mobility Overal bed mobility: Needs Assistance Bed Mobility: Supine to Sit;Sit to Supine     Supine to sit: Mod assist Sit to supine: Mod assist   General bed mobility comments: Mod A to get BLEs off of the bed and to sit upright; log roll training education provided secondary to T11 burst fracture  Transfers Overall transfer level: Needs assistance Equipment used: Hemi-walker Transfers: Sit to/from Stand Sit to Stand: Min assist         General transfer comment: Min A and effortful during sit to/from stand from elevated EOB with min instability   Ambulation/Gait   Ambulation Distance (Feet): 5 Feet Assistive device: Hemi-walker;Quad cane Gait Pattern/deviations: Step-through pattern;Decreased step length - right;Decreased step length - left   Gait velocity interpretation: <1.8 ft/sec, indicative of risk for recurrent falls General Gait Details: Amb x 5' with both a HW and a QC with pt unsteady with short B step length and very slow cadence with each.  SpO2 on room air 86-87% after amb.  Nursing notified and requested pt be put on 1LO2/min.   Stairs Stairs:  (Deferred secondary to weakness and difficulty with amb)          Wheelchair Mobility    Modified Rankin (Stroke Patients Only)       Balance Overall balance assessment: Needs assistance  Sitting-balance support: Feet unsupported;Feet supported;Single extremity supported Sitting balance-Leahy Scale: Fair     Standing balance support: Single extremity supported Standing balance-Leahy Scale: Poor                               Pertinent Vitals/Pain Pain Assessment: 0-10 Pain Score: 5  Pain Location: L shoulder Pain Descriptors / Indicators: Aching;Sore Pain Intervention(s): Monitored during session;Limited activity within  patient's tolerance;Other (comment) (Pt refused offer to call nsg regarding pain meds)    Home Living Family/patient expects to be discharged to:: Private residence Living Arrangements: Alone Available Help at Discharge: Available PRN/intermittently;Family Type of Home: House Home Access: Stairs to enter Entrance Stairs-Rails: None Entrance Stairs-Number of Steps: 2 Home Layout: One level Home Equipment: Walker - 2 wheels      Prior Function Level of Independence: Independent         Comments: Pt does not drive, but is able to be out in the community and stays relatively active.  Pt ambulates without AD and is Ind with all ADLs with no fall history prior to recent fall.  Pt reports fell while out in the community but could not recall details.      Hand Dominance   Dominant Hand: Right    Extremity/Trunk Assessment   Upper Extremity Assessment Upper Extremity Assessment: Generalized weakness    Lower Extremity Assessment Lower Extremity Assessment: Generalized weakness       Communication   Communication: No difficulties  Cognition Arousal/Alertness: Awake/alert Behavior During Therapy: WFL for tasks assessed/performed Overall Cognitive Status: Within Functional Limits for tasks assessed                                        General Comments      Exercises Total Joint Exercises Ankle Circles/Pumps: AROM;Both;10 reps;15 reps Quad Sets: Strengthening;Both;10 reps Gluteal Sets: Strengthening;Both;10 reps Hip ABduction/ADduction: AAROM;Both;10 reps Straight Leg Raises: AAROM;Both;10 reps Long Arc Quad: AROM;Both;10 reps Marching in Standing: AROM;Both;5 reps Other Exercises Other Exercises: B seated hip flex x 10   Assessment/Plan    PT Assessment Patient needs continued PT services  PT Problem List Decreased strength;Decreased activity tolerance;Decreased balance;Decreased knowledge of use of DME;Decreased mobility       PT Treatment  Interventions DME instruction;Gait training;Stair training;Functional mobility training;Neuromuscular re-education;Balance training;Therapeutic exercise;Therapeutic activities;Patient/family education    PT Goals (Current goals can be found in the Care Plan section)  Acute Rehab PT Goals Patient Stated Goal: To walk better with better balance PT Goal Formulation: With patient Time For Goal Achievement: 05/11/17 Potential to Achieve Goals: Good    Frequency 7X/week   Barriers to discharge Decreased caregiver support;Inaccessible home environment      Co-evaluation               AM-PAC PT "6 Clicks" Daily Activity  Outcome Measure Difficulty turning over in bed (including adjusting bedclothes, sheets and blankets)?: Unable Difficulty moving from lying on back to sitting on the side of the bed? : Unable Difficulty sitting down on and standing up from a chair with arms (e.g., wheelchair, bedside commode, etc,.)?: Unable Help needed moving to and from a bed to chair (including a wheelchair)?: A Little Help needed walking in hospital room?: A Lot Help needed climbing 3-5 steps with a railing? : A Lot 6 Click Score: 10    End of  Session Equipment Utilized During Treatment: Gait belt;Oxygen Activity Tolerance: Patient limited by fatigue Patient left: in chair;with chair alarm set;with call bell/phone within reach Nurse Communication: Mobility status;Other (comment) (TLSO recommendation from neuro) PT Visit Diagnosis: Difficulty in walking, not elsewhere classified (R26.2);Unsteadiness on feet (R26.81);Muscle weakness (generalized) (M62.81)    Time: 1410-1450 PT Time Calculation (min) (ACUTE ONLY): 40 min   Charges:   PT Evaluation $PT Eval Low Complexity: 1 Low PT Treatments $Therapeutic Exercise: 8-22 mins $Therapeutic Activity: 8-22 mins   PT G Codes:   PT G-Codes **NOT FOR INPATIENT CLASS** Functional Assessment Tool Used: AM-PAC 6 Clicks Basic Mobility Functional  Limitation: Mobility: Walking and moving around Mobility: Walking and Moving Around Current Status (E2336): At least 60 percent but less than 80 percent impaired, limited or restricted Mobility: Walking and Moving Around Goal Status [phone]): At least 1 percent but less than 20 percent impaired, limited or restricted    D. Royetta Asal PT, DPT 04/28/17, 3:16 PM

## 2017-04-28 NOTE — Progress Notes (Signed)
[facility] NAME: Kimberly [name]    MR#:  [id number]  DATE OF BIRTH:  06/14/1928  SUBJECTIVE:   Pt. Here Due to a fall and noted to have a left shoulder fracture and also noted to be severely hyponatremic. Patient is complaining of some pain in her left shoulder. Sodium level has improved after some diuretics and also patient receiving Tolvaptan.   REVIEW OF SYSTEMS:    Review of Systems  Constitutional: Negative for chills and fever.  HENT: Negative for congestion and tinnitus.   Eyes: Negative for blurred vision and double vision.  Respiratory: Negative for cough, shortness of breath and wheezing.   Cardiovascular: Negative for chest pain, orthopnea and PND.  Gastrointestinal: Negative for abdominal pain, diarrhea, nausea and vomiting.  Genitourinary: Negative for dysuria and hematuria.  Musculoskeletal: Positive for falls and joint pain (Left Shoulder).  Neurological: Negative for dizziness, sensory change and focal weakness.  All other systems reviewed and are negative.   Nutrition: Heart Healthy Tolerating Diet: Yes Tolerating PT: Await Eval.      DRUG ALLERGIES:   Allergies  Allergen Reactions  . Ciprofloxacin Other (See Comments)    REACTION: burning of mouth and skin  . Lisinopril Cough  . Remeron [Mirtazapine] Other (See Comments)    constipation  . Sulfonamide Derivatives Other (See Comments)    REACTION: malaise  . Tetanus Toxoid Swelling  . Vesicare [Solifenacin Succinate] Other (See Comments)    Blurred vision and weakness  . Zoloft [Sertraline Hcl] Nausea And Vomiting  . Cephalexin Rash and Other (See Comments)    REACTION: rash and burning sensation to skin on arms and legs with itching.  Madelin Headings [Amitriptyline] Palpitations  . Penicillins Rash and Other (See Comments)    Has patient had a PCN reaction causing immediate rash, facial/tongue/throat swelling, SOB or lightheadedness with hypotension:  Unknown Has patient had a PCN reaction causing severe rash involving mucus membranes or skin necrosis: Unknown Has patient had a PCN reaction that required hospitalization: Unknown Has patient had a PCN reaction occurring within the last 10 years: No If all of the above answers are "NO", then may proceed with Cephalosporin use. Has since take Augmentin with no problem    VITALS:  Blood pressure (!) 125/46, pulse 63, temperature 99 F (37.2 C), temperature source Oral, resp. rate 18, height 5\' 3"  (1.6 m), weight 83.7 kg (184 lb 9.6 oz), last menstrual period 08/03/1978, SpO2 99 %.  PHYSICAL EXAMINATION:   Physical Exam  GENERAL:  83 y.o.-year-old patient lying in bed in no acute distress.  EYES: Pupils equal, round, reactive to light and accommodation. No scleral icterus. Extraocular muscles intact.  HEENT: Head atraumatic, normocephalic. Oropharynx and nasopharynx clear.  NECK:  Supple, no jugular venous distention. No thyroid enlargement, no tenderness.  LUNGS: Normal breath sounds bilaterally, no wheezing, rales, rhonchi. No use of accessory muscles of respiration.  CARDIOVASCULAR: S1, S2 normal. No murmurs, rubs, or gallops.  ABDOMEN: Soft, nontender, nondistended. Bowel sounds present. No organomegaly or mass.  EXTREMITIES: No cyanosis, clubbing or edema b/l.   Left shoulder is in a sling. NEUROLOGIC: Cranial nerves II through XII are intact. No focal Motor or sensory deficits b/l.   PSYCHIATRIC: The patient is alert and oriented x 3.  SKIN: No obvious rash, lesion, or ulcer.    LABORATORY PANEL:   CBC  Recent Labs Lab 04/28/17 0617  WBC 5.1  HGB 8.8*  HCT 25.3*  PLT 149*   ------------------------------------------------------------------------------------------------------------------  Chemistries   Recent Labs Lab 04/27/17 1612  04/28/17 0954  NA 114*  < > 120*  K 3.9  < > 3.4*  CL 79*  < > 86*  CO2 24  < > 26  GLUCOSE 95  < > 125*  BUN 18  < > 16   CREATININE 1.11*  < > 0.97  CALCIUM 7.9*  < > 7.6*  AST 17  --   --   ALT 12*  --   --   ALKPHOS 84  --   --   BILITOT 0.4  --   --   < > = values in this interval not displayed. ------------------------------------------------------------------------------------------------------------------  Cardiac Enzymes  Recent Labs Lab 04/27/17 1612  TROPONINI 0.04*   ------------------------------------------------------------------------------------------------------------------  RADIOLOGY:  Ct Head Wo Contrast  Result Date: 04/27/2017 CLINICAL DATA:  Fall this morning, found down. Bruising along the back of the head. EXAM: CT HEAD WITHOUT CONTRAST CT CERVICAL SPINE WITHOUT CONTRAST TECHNIQUE: Multidetector CT imaging of the head and cervical spine was performed following the standard protocol without intravenous contrast. Multiplanar CT image reconstructions of the cervical spine were also generated. COMPARISON:  04/28/2016 FINDINGS: CT HEAD FINDINGS Brain: Stable calcifications in the lentiform nuclei bilaterally, likely physiologic. Otherwise, the brainstem, cerebellum, cerebral peduncles, thalami, basal ganglia, basilar cisterns, and ventricular system appear within normal limits. Periventricular white matter and corona radiata hypodensities favor chronic ischemic microvascular white matter disease. No intracranial hemorrhage, mass lesion, or acute CVA. Vascular: There is atherosclerotic calcification of the cavernous carotid arteries bilaterally. Skull: Unremarkable Sinuses/Orbits: Complete opacification of the left sphenoid sinus. Chronic ethmoid sinusitis. Other: No supplemental non-categorized findings. CT CERVICAL SPINE FINDINGS Alignment: Mild reversal the normal cervical lordosis. 1.5 mm degenerative anterolisthesis at C3-4. Skull base and vertebrae: Spurring and loss of articular space at the anterior C1- 2 articulation. Degenerative loss of intervertebral disc height at C3-4 and C4-5. No  acute cervical spine fracture. Soft tissues and spinal canal: Confluent nodularity in the somewhat and large left thyroid gland. Right thyroid lobe is absent. Rightward deviation of the trachea. Disc levels: Uncinate and facet spurring cause at least moderate right foraminal stenosis at C4-5, and borderline left foraminal stenosis at C3-4 and C4-5. Upper chest: Unremarkable Other: No supplemental non-categorized findings. IMPRESSION: 1. No acute intracranial findings are acute cervical spine findings. 2. Paranasal sinusitis with complete opacification of the left sphenoid sinus and chronic because all thickening in the ethmoid air cells. 3. Cervical spondylosis, causing right foraminal impingement at C4-5. 4. Prior right thyroid lobe resection. Nodular and enlarged left thyroid lobe. This has been previously worked up for example on prior thyroid ultrasound 08/04/2012. Electronically Signed   By: Van Clines M.D.   On: 04/27/2017 17:12   Ct Cervical Spine Wo Contrast  Result Date: 04/27/2017 CLINICAL DATA:  Fall this morning, found down. Bruising along the back of the head. EXAM: CT HEAD WITHOUT CONTRAST CT CERVICAL SPINE WITHOUT CONTRAST TECHNIQUE: Multidetector CT imaging of the head and cervical spine was performed following the standard protocol without intravenous contrast. Multiplanar CT image reconstructions of the cervical spine were also generated. COMPARISON:  04/28/2016 FINDINGS: CT HEAD FINDINGS Brain: Stable calcifications in the lentiform nuclei bilaterally, likely physiologic. Otherwise, the brainstem, cerebellum, cerebral peduncles, thalami, basal ganglia, basilar cisterns, and ventricular system appear within normal limits. Periventricular white matter and corona radiata hypodensities favor chronic ischemic microvascular white matter disease. No intracranial hemorrhage, mass lesion, or acute CVA. Vascular: There is atherosclerotic calcification of the cavernous carotid  arteries  bilaterally. Skull: Unremarkable Sinuses/Orbits: Complete opacification of the left sphenoid sinus. Chronic ethmoid sinusitis. Other: No supplemental non-categorized findings. CT CERVICAL SPINE FINDINGS Alignment: Mild reversal the normal cervical lordosis. 1.5 mm degenerative anterolisthesis at C3-4. Skull base and vertebrae: Spurring and loss of articular space at the anterior C1- 2 articulation. Degenerative loss of intervertebral disc height at C3-4 and C4-5. No acute cervical spine fracture. Soft tissues and spinal canal: Confluent nodularity in the somewhat and large left thyroid gland. Right thyroid lobe is absent. Rightward deviation of the trachea. Disc levels: Uncinate and facet spurring cause at least moderate right foraminal stenosis at C4-5, and borderline left foraminal stenosis at C3-4 and C4-5. Upper chest: Unremarkable Other: No supplemental non-categorized findings. IMPRESSION: 1. No acute intracranial findings are acute cervical spine findings. 2. Paranasal sinusitis with complete opacification of the left sphenoid sinus and chronic because all thickening in the ethmoid air cells. 3. Cervical spondylosis, causing right foraminal impingement at C4-5. 4. Prior right thyroid lobe resection. Nodular and enlarged left thyroid lobe. This has been previously worked up for example on prior thyroid ultrasound 08/04/2012. Electronically Signed   By: Van Clines M.D.   On: 04/27/2017 17:12   Ct Thoracic Spine Wo Contrast  Result Date: 04/27/2017 CLINICAL DATA:  Trip and fall at home. Possible multiple recent falls. EXAM: CT THORACIC AND LUMBAR SPINE WITHOUT CONTRAST TECHNIQUE: Multidetector CT imaging of the thoracic and lumbar spine was performed without contrast. Multiplanar CT image reconstructions were also generated. COMPARISON:  Lumbar spine radiographs November 29, 2016 and chest radiograph July 27 , 2018 FINDINGS: CT THORACIC SPINE FINDINGS ALIGNMENT: Maintained thoracic lordosis. No  malalignment. VERTEBRAE: Moderate to severe T11 burst fracture with 50-75% height loss, 2 mm retropulsed bony fragments. Fracture was present July 2018, however fracture line present along superior endplate. The remaining thoracic vertebral bodies intact. Intervertebral disc heights generally preserved, multilevel intradiscal mineralization. Multilevel endplate sclerosis and spurring. Osteopenia. Scattered old Schmorl's nodes. Multilevel mild facet arthropathy. PARASPINAL AND OTHER SOFT TISSUES: Partially imaged thyromegaly displacing the trachea to the RIGHT. Calcific atherosclerosis aortic arch. Cardiomegaly. Small RIGHT pleural effusion. DISC LEVELS: Mild canal stenosis T10-11 due to retropulsed bony fragments. No osseous neural foraminal narrowing at any level. CT LUMBAR SPINE FINDINGS SEGMENTATION: For the purposes of this report the last well-formed intervertebral disc space is reported as L5-S1. ALIGNMENT: Maintained lumbar lordosis. No malalignment. VERTEBRAE: Vertebral bodies and posterior elements are intact. Intervertebral disc heights preserved, multilevel vacuum disc. No destructive bony lesions. Osteopenia. PARASPINAL AND OTHER SOFT TISSUES: Included prevertebral and paraspinal soft tissues are unremarkable. DISC LEVELS: L1-2: No disc bulge, canal stenosis nor neural foraminal narrowing. L2-3: Small LEFT extraforaminal disc protrusion without canal stenosis or neural foraminal narrowing. L3-4: Moderate LEFT extraforaminal disc protrusion without canal stenosis. Moderate LEFT neural foraminal narrowing. L4-5: Small broad-based disc bulge, mild facet arthropathy and ligamentum flavum redundancy without canal stenosis or neural foraminal narrowing. L5-S1: Small broad-based disc bulge, mild facet arthropathy and ligamentum flavum redundancy without canal stenosis or neural foraminal narrowing. IMPRESSION: CT THORACIC SPINE IMPRESSION 1. Subacute T11 moderate to severe burst fracture resulting in mild canal  stenosis. No malalignment. 2. Osteopenia. 3. Thyromegaly displacing the trachea to the RIGHT, incompletely characterized. CT LUMBAR SPINE IMPRESSION 1. No fracture or malalignment. 2. Osteopenia. 3. L3-4 disc protrusion results in moderate LEFT L3-4 neural foraminal narrowing. No canal stenosis at any level. Aortic Atherosclerosis (ICD10-I70.0). Electronically Signed   By: Elon Alas M.D.   On: 04/27/2017 17:06  Ct Lumbar Spine Wo Contrast  Result Date: 04/27/2017 CLINICAL DATA:  Trip and fall at home. Possible multiple recent falls. EXAM: CT THORACIC AND LUMBAR SPINE WITHOUT CONTRAST TECHNIQUE: Multidetector CT imaging of the thoracic and lumbar spine was performed without contrast. Multiplanar CT image reconstructions were also generated. COMPARISON:  Lumbar spine radiographs November 29, 2016 and chest radiograph July 27 , 2018 FINDINGS: CT THORACIC SPINE FINDINGS ALIGNMENT: Maintained thoracic lordosis. No malalignment. VERTEBRAE: Moderate to severe T11 burst fracture with 50-75% height loss, 2 mm retropulsed bony fragments. Fracture was present July 2018, however fracture line present along superior endplate. The remaining thoracic vertebral bodies intact. Intervertebral disc heights generally preserved, multilevel intradiscal mineralization. Multilevel endplate sclerosis and spurring. Osteopenia. Scattered old Schmorl's nodes. Multilevel mild facet arthropathy. PARASPINAL AND OTHER SOFT TISSUES: Partially imaged thyromegaly displacing the trachea to the RIGHT. Calcific atherosclerosis aortic arch. Cardiomegaly. Small RIGHT pleural effusion. DISC LEVELS: Mild canal stenosis T10-11 due to retropulsed bony fragments. No osseous neural foraminal narrowing at any level. CT LUMBAR SPINE FINDINGS SEGMENTATION: For the purposes of this report the last well-formed intervertebral disc space is reported as L5-S1. ALIGNMENT: Maintained lumbar lordosis. No malalignment. VERTEBRAE: Vertebral bodies and posterior  elements are intact. Intervertebral disc heights preserved, multilevel vacuum disc. No destructive bony lesions. Osteopenia. PARASPINAL AND OTHER SOFT TISSUES: Included prevertebral and paraspinal soft tissues are unremarkable. DISC LEVELS: L1-2: No disc bulge, canal stenosis nor neural foraminal narrowing. L2-3: Small LEFT extraforaminal disc protrusion without canal stenosis or neural foraminal narrowing. L3-4: Moderate LEFT extraforaminal disc protrusion without canal stenosis. Moderate LEFT neural foraminal narrowing. L4-5: Small broad-based disc bulge, mild facet arthropathy and ligamentum flavum redundancy without canal stenosis or neural foraminal narrowing. L5-S1: Small broad-based disc bulge, mild facet arthropathy and ligamentum flavum redundancy without canal stenosis or neural foraminal narrowing. IMPRESSION: CT THORACIC SPINE IMPRESSION 1. Subacute T11 moderate to severe burst fracture resulting in mild canal stenosis. No malalignment. 2. Osteopenia. 3. Thyromegaly displacing the trachea to the RIGHT, incompletely characterized. CT LUMBAR SPINE IMPRESSION 1. No fracture or malalignment. 2. Osteopenia. 3. L3-4 disc protrusion results in moderate LEFT L3-4 neural foraminal narrowing. No canal stenosis at any level. Aortic Atherosclerosis (ICD10-I70.0). Electronically Signed   By: Elon Alas M.D.   On: 04/27/2017 17:06   Dg Chest Portable 1 View  Result Date: 04/27/2017 CLINICAL DATA:  Status post fall today.  Weakness. EXAM: PORTABLE CHEST 1 VIEW COMPARISON:  March 01, 2017 FINDINGS: The mediastinal contour is stable. The heart size is enlarged. Mild diffuse increased pulmonary interstitium is identified bilaterally. Chronic elevation of right hemidiaphragm is noted. There is no focal pneumonia or pleural effusion. The bony structures are stable. IMPRESSION: Mild increased pulmonary interstitium bilaterally which can be seen in mild interstitial edema. Cardiomegaly. Electronically Signed   By:  Abelardo Diesel M.D.   On: 04/27/2017 17:10   Dg Shoulder Left  Result Date: 04/27/2017 CLINICAL DATA:  Status post fall today, pain of left shoulder. EXAM: LEFT SHOULDER - 2+ VIEW COMPARISON:  None. FINDINGS: There is mild displaced fracture of the proximal left humerus at the humeral neck. The visualized ribs and left lung are normal. IMPRESSION: Displaced fracture of proximal left humerus. Electronically Signed   By: Abelardo Diesel M.D.   On: 04/27/2017 17:11   Dg Humerus Left  Result Date: 04/27/2017 CLINICAL DATA:  Status post fall today with left shoulder pain. EXAM: LEFT HUMERUS - 2+ VIEW COMPARISON:  None. FINDINGS: There is displaced fracture of the proximal left  humerus. There is no dislocation. IMPRESSION: Fracture proximal left humerus. Electronically Signed   By: Abelardo Diesel M.D.   On: 04/27/2017 17:12     ASSESSMENT AND PLAN:   83 year old female with past medical history of hypertension, hypothyroidism, hyperlipidemia, GERD, history of chronic systolic CHF who presented to the hospital after a fall and noted to have a left humeral fracture and also noted to be severely hyponatremic.  1. Hyponatremia-this is hypervolemic hyponatremia secondary to CHF. Patient got some Lasix and sodium somewhat improved. -Seen by nephrology and given a dose of Tolvaptan and will follow sodium.  - appreciate Nephro input.   2. Status post fall and left humeral fracture-seen by orthopedics and no acute surgical intervention. We'll place a sling from 4-6 weeks and follow up as an outpatient. Continue pain control and supportive care. We'll get physical therapy evaluation.  3. T11 burst fracture-patient has a history of chronic back issues and pain. Neurosurgery was consulted and they recommended a back brace but the patient does not want that presently. Continue supportive care for now and there is no need for acute surgical intervention presently.  4. Essential HTN - continue carvedilol,  losartan.  5. Hypothyroidism-continue Synthroid.   All the records are reviewed and case discussed with Care Management/Social Worker. Management plans discussed with the patient, family and they are in agreement.  CODE STATUS:  DO NOT RESUSCITATE  DVT Prophylaxis: Lovenox  TOTAL TIME TAKING CARE OF THIS PATIENT:  30 minutes.   POSSIBLE D/C IN  1-2 DAYS, DEPENDING ON CLINICAL CONDITION.   Henreitta Leber M.D on 04/28/2017 at 2:49 PM  Between 7am to 6pm - Pager - [phone]  After 6pm go to [email or web address] - [employer] [facility]  Office  [phone]  CC: Primary care physician; Tower, Wynelle Fanny, MD

## 2017-04-28 NOTE — Consult Note (Signed)
[facility]  CONSULT NOTE    Date: 04/28/2017                  Patient Name:  Kimberly [name]  MRN: [id number]  DOB: [birth year]  Age / Sex: 83 y.o., female         PCP: Abner Greenspan, MD                 Service Requesting Consult: Dr. Darvin Neighbours                 Reason for Consult: Hyponatremia            History of Present Illness: [name] is a 83 y.o. white female with congestive heart failure systolic, hypertension, anemia, hypothyroidism, GERD, hyperlipidemia, GERD, coronary artery disease, who was admitted to [facility] on 04/27/2017 for Hyponatremia [E87.1] Closed stable burst fracture of twelfth thoracic vertebra with routine healing, subsequent encounter [S22.081D] Closed fracture of proximal end of left humerus, unspecified fracture morphology, initial encounter [S42.202A]   Daughter at bedside.   Sodium of 113. Given hypertonic saline in ED. Then given furosemide 40mg  IV once. Sodium improved to 118.    Medications: Outpatient medications: Prescriptions Prior to Admission  Medication Sig Dispense Refill Last Dose  . aspirin 81 MG tablet Take 81 mg by mouth daily.     04/27/2017 at 0800  . carvedilol (COREG) 3.125 MG tablet Take 1 tablet (3.125 mg total) by mouth 2 (two) times daily with a meal. Please keep 05/20/17 appt for future refills 60 tablet 0 04/27/2017 at 0800  . cetirizine (ZYRTEC) 10 MG tablet Take 10 mg by mouth daily as needed for allergies.    PRN at PRN  . docusate sodium 100 MG CAPS Take 100 mg by mouth 2 (two) times daily. For constipation 60 capsule 3 PRN at PRN  . esomeprazole (NEXIUM) 40 MG capsule TAKE 1 CAPSULE (40 MG TOTAL) BY MOUTH DAILY. 90 capsule 2 04/27/2017 at 0800  . fluticasone (FLONASE) 50 MCG/ACT nasal spray Place 2 sprays into both nostrils daily. 48 g 3 PRN at PRN  . furosemide (LASIX) 40 MG tablet Take 0.5 tablets (20 mg total) by mouth daily. (Patient taking differently: Take 40 mg by mouth daily. ) 30 tablet 3 04/27/2017  at 0800  . guaiFENesin (MUCINEX) 600 MG 12 hr tablet Take 600 mg by mouth 2 (two) times daily as needed for cough or to loosen phlegm.    PRN at PRN  . levothyroxine (SYNTHROID, LEVOTHROID) 25 MCG tablet Take 1 tablet (25 mcg total) by mouth daily. 90 tablet 3 04/27/2017 at 0730  . losartan (COZAAR) 25 MG tablet TAKE 1 TABLET BY MOUTH EVERY DAY 30 tablet 3 04/27/2017 at 0800  . Multiple Vitamin (MULTIVITAMIN WITH MINERALS) TABS tablet Take 1 tablet by mouth daily.   Past Month at Unknown time  . spironolactone (ALDACTONE) 25 MG tablet TAKE 1 TABLET BY MOUTH EVERY DAY 30 tablet 3 04/27/2017 at 0800  . zolpidem (AMBIEN) 10 MG tablet TAKE 1/2 TO 1 TABLET BY MOUTH DAILY AT BEDTIME AS NEEDED 30 tablet 3 04/26/2017 at 2000    Current medications: Current Facility-Administered Medications  Medication Dose Route Frequency Provider Last Rate Last Dose  . acetaminophen (TYLENOL) tablet 650 mg  650 mg Oral Q6H PRN Sudini, Alveta Heimlich, MD       Or  . acetaminophen (TYLENOL) suppository 650 mg  650 mg Rectal Q6H PRN Hillary Bow, MD      .  albuterol (PROVENTIL) (2.5 MG/3ML) 0.083% nebulizer solution 2.5 mg  2.5 mg Nebulization Q2H PRN Sudini, Alveta Heimlich, MD      . aspirin EC tablet 81 mg  81 mg Oral Daily Hillary Bow, MD   81 mg at 04/28/17 0847  . carvedilol (COREG) tablet 3.125 mg  3.125 mg Oral BID WC Hillary Bow, MD   3.125 mg at 04/28/17 0846  . docusate sodium (COLACE) capsule 100 mg  100 mg Oral BID Hillary Bow, MD   100 mg at 04/28/17 0847  . enoxaparin (LOVENOX) injection 40 mg  40 mg Subcutaneous Q24H Hillary Bow, MD   40 mg at 04/27/17 2154  . fluticasone (FLONASE) 50 MCG/ACT nasal spray 2 spray  2 spray Each Nare Daily PRN Sudini, Srikar, MD      . guaiFENesin (MUCINEX) 12 hr tablet 600 mg  600 mg Oral BID PRN Sudini, Alveta Heimlich, MD      . levothyroxine (SYNTHROID, LEVOTHROID) tablet 25 mcg  25 mcg Oral QAC breakfast Henreitta Leber, MD   25 mcg at 04/28/17 0846  . losartan (COZAAR) tablet 25 mg   25 mg Oral Daily Hillary Bow, MD   25 mg at 04/28/17 0847  . ondansetron (ZOFRAN) tablet 4 mg  4 mg Oral Q6H PRN Hillary Bow, MD       Or  . ondansetron (ZOFRAN) injection 4 mg  4 mg Intravenous Q6H PRN Sudini, Alveta Heimlich, MD      . oxyCODONE (Oxy IR/ROXICODONE) immediate release tablet 5 mg  5 mg Oral Q4H PRN Hillary Bow, MD   5 mg at 04/27/17 2208  . pantoprazole (PROTONIX) EC tablet 40 mg  40 mg Oral Daily Hillary Bow, MD   40 mg at 04/28/17 0847  . polyethylene glycol (MIRALAX / GLYCOLAX) packet 17 g  17 g Oral Daily PRN Sudini, Srikar, MD      . sodium chloride flush (NS) 0.9 % injection 3 mL  3 mL Intravenous Q12H Hillary Bow, MD   3 mL at 04/28/17 0848  . tolvaptan (SAMSCA) tablet 15 mg  15 mg Oral Daily Fox Salminen, MD   15 mg at 04/28/17 1106  . zolpidem (AMBIEN) tablet 5 mg  5 mg Oral QHS PRN Hillary Bow, MD   5 mg at 04/27/17 2156      Allergies: Allergies  Allergen Reactions  . Ciprofloxacin Other (See Comments)    REACTION: burning of mouth and skin  . Lisinopril Cough  . Remeron [Mirtazapine] Other (See Comments)    constipation  . Sulfonamide Derivatives Other (See Comments)    REACTION: malaise  . Tetanus Toxoid Swelling  . Vesicare [Solifenacin Succinate] Other (See Comments)    Blurred vision and weakness  . Zoloft [Sertraline Hcl] Nausea And Vomiting  . Cephalexin Rash and Other (See Comments)    REACTION: rash and burning sensation to skin on arms and legs with itching.  Madelin Headings [Amitriptyline] Palpitations  . Penicillins Rash and Other (See Comments)    Has patient had a PCN reaction causing immediate rash, facial/tongue/throat swelling, SOB or lightheadedness with hypotension: Unknown Has patient had a PCN reaction causing severe rash involving mucus membranes or skin necrosis: Unknown Has patient had a PCN reaction that required hospitalization: Unknown Has patient had a PCN reaction occurring within the last 10 years: No If all of the  above answers are "NO", then may proceed with Cephalosporin use. Has since take Augmentin with no problem      Past Medical History: Past Medical  History:  Diagnosis Date  . Allergy    allergic rhinitis  . Chronic combined systolic and diastolic CHF (congestive heart failure) ([place])    a. 01/2014 Echo: EF 50-55%, no rwma, Gr1 DD, triv AI, mild MR;  b. 01/2017 Echo: EF 35%, diff HK, gr2  DD, fibrocalcific AoV w/o AS, mild AI/MR, sev dil LA, PASP 80mmHg.  Marland Kitchen GERD (gastroesophageal reflux disease)   . Hyperlipidemia   . Hypertension   . Hypothyroid   . Insomnia   . Non-obstructive CAD (coronary artery disease)    a. Non-obstructive dzs by caths in 2001 and 2006;  b. 03/2017 Cath: LM nl, LAD 30p, LCX nl, RCA 78m/d, EF 45-50%.     Past Surgical History: Past Surgical History:  Procedure Laterality Date  . Opp CATH AND CORONARY ANGIOGRAPHY N/A 03/03/2017   Procedure: Left Heart Cath and Coronary Angiography;  Surgeon: Minna Merritts, MD;  Location: [facility];  Service: Cardiovascular;  Laterality: N/A;  . OOPHORECTOMY    . THYROID SURGERY       Family History: Family History  Problem Relation Age of Onset  . Heart disease Mother   . Hypertension Mother      Social History: Social History   Social History  . Marital status: Widowed    Spouse name: N/A  . Number of children: N/A  . Years of education: N/A   Occupational History  . Not on file.   Social History Main Topics  . Smoking status: Never Smoker  . Smokeless tobacco: Never Used  . Alcohol use No  . Drug use: No  . Sexual activity: No   Other Topics Concern  . Not on file   Social History Narrative  . No narrative on file     Review of Systems: Review of Systems  Constitutional: Negative for chills, diaphoresis, fever, malaise/fatigue and weight loss.  HENT: Negative.  Negative for congestion, ear discharge, ear  pain, hearing loss, nosebleeds, sinus pain, sore throat and tinnitus.   Eyes: Negative for blurred vision, double vision, photophobia, pain, discharge and redness.  Respiratory: Negative.  Negative for cough, hemoptysis, sputum production, shortness of breath, wheezing and stridor.   Cardiovascular: Positive for orthopnea, leg swelling and PND. Negative for chest pain, palpitations and claudication.  Gastrointestinal: Positive for nausea. Negative for abdominal pain, blood in stool, constipation, diarrhea, heartburn, melena and vomiting.  Genitourinary: Negative.  Negative for dysuria, flank pain, frequency, hematuria and urgency.  Musculoskeletal: Negative.  Negative for back pain, falls, joint pain, myalgias and neck pain.  Skin: Negative.  Negative for itching and rash.  Neurological: Negative for dizziness, tingling, tremors, sensory change, speech change, focal weakness, seizures, loss of consciousness, weakness and headaches.  Endo/Heme/Allergies: Negative for environmental allergies and polydipsia. Does not bruise/bleed easily.  Psychiatric/Behavioral: Negative for depression, hallucinations, memory loss, substance abuse and suicidal ideas. The patient is not nervous/anxious and does not have insomnia.     Vital Signs: Blood pressure (!) 142/49, pulse 67, temperature 98.5 F (36.9 C), temperature source Oral, resp. rate 18, height 5\' 3"  (1.6 m), weight 83.7 kg (184 lb 9.6 oz), last menstrual period 08/03/1978, SpO2 100 %.  Weight trends: Filed Weights   04/27/17 1523 04/28/17 0500  Weight: 81.6 kg (180 lb) 83.7 kg (184 lb 9.6 oz)    Physical Exam: General: NAD, laying in bed  Head: Normocephalic, atraumatic. Moist  oral mucosal membranes  Eyes: Anicteric, PERRL  Neck: Supple, trachea midline  Lungs:  Clear to auscultation  Heart: Regular rate and rhythm  Abdomen:  Soft, nontender,   Extremities: no peripheral edema. Left arm in brace  Neurologic: Nonfocal, moving all four  extremities  Skin: No lesions        Lab results: Basic Metabolic Panel:  Recent Labs Lab 04/27/17 1612  04/27/17 2306 04/28/17 0617 04/28/17 0954  NA 114*  < > 118* 118* 120*  K 3.9  --   --  3.4* 3.4*  CL 79*  --   --  85* 86*  CO2 24  --   --  25 26  GLUCOSE 95  --   --  80 125*  BUN 18  --   --  17 16  CREATININE 1.11*  --   --  1.15* 0.97  CALCIUM 7.9*  --   --  7.6* 7.6*  < > = values in this interval not displayed.  Liver Function Tests:  Recent Labs Lab 04/27/17 1612  AST 17  ALT 12*  ALKPHOS 84  BILITOT 0.4  PROT 5.9*  ALBUMIN 3.3*   No results for input(s): LIPASE, AMYLASE in the last 168 hours. No results for input(s): AMMONIA in the last 168 hours.  CBC:  Recent Labs Lab 04/27/17 1612 04/28/17 0617  WBC 7.0 5.1  NEUTROABS 5.5  --   HGB 9.5* 8.8*  HCT 27.0* 25.3*  MCV 84.9 83.7  PLT 160 149*    Cardiac Enzymes:  Recent Labs Lab 04/27/17 1612  TROPONINI 0.04*    BNP: Invalid input(s): POCBNP  CBG: No results for input(s): GLUCAP in the last 168 hours.  Microbiology: Results for orders placed or performed in visit on 12/07/16  Urine culture     Status: None   Collection Time: 12/07/16  3:56 PM  Result Value Ref Range Status   Culture KLEBSIELLA PNEUMONIAE  Final   Colony Count Greater than 100,000 CFU/mL  Final   Organism ID, Bacteria KLEBSIELLA PNEUMONIAE  Final      Susceptibility   Klebsiella pneumoniae -  (no method available)    AMPICILLIN >=32 Resistant     AMOX/CLAVULANIC <=2 Sensitive     AMPICILLIN/SULBACTAM 8 Sensitive     PIP/TAZO <=4 Sensitive     IMIPENEM <=0.25 Sensitive     CEFAZOLIN <=4 Not Reportable     CEFTRIAXONE <=1 Sensitive     CEFTAZIDIME <=1 Sensitive     CEFEPIME <=1 Sensitive     GENTAMICIN <=1 Sensitive     TOBRAMYCIN <=1 Sensitive     CIPROFLOXACIN <=0.25 Sensitive     LEVOFLOXACIN <=0.12 Sensitive     NITROFURANTOIN 64 Intermediate     TRIMETH/SULFA* <=20 Sensitive      * NR=NOT  REPORTABLE,SEE COMMENTORAL therapy:A cefazolin MIC of <32 predicts susceptibility to the oral agents cefaclor,cefdinir,cefpodoxime,cefprozil,cefuroxime,cephalexin,and loracarbef when used for therapy of uncomplicated UTIs due to E.coli,K.pneumomiae,and P.mirabilis. PARENTERAL therapy: A cefazolinMIC of >8 indicates resistance to parenteralcefazolin. An alternate test method must beperformed to confirm susceptibility to parenteralcefazolin.    Coagulation Studies: No results for input(s): LABPROT, INR in the last 72 hours.  Urinalysis:  Recent Labs  04/27/17 1612  COLORURINE YELLOW*  LABSPEC 1.008  PHURINE 5.0  GLUCOSEU NEGATIVE  HGBUR NEGATIVE  BILIRUBINUR NEGATIVE  KETONESUR NEGATIVE  PROTEINUR 30*  NITRITE NEGATIVE  LEUKOCYTESUR NEGATIVE      Imaging: Ct Head Wo Contrast  Result Date: 04/27/2017 CLINICAL DATA:  Fall  this morning, found down. Bruising along the back of the head. EXAM: CT HEAD WITHOUT CONTRAST CT CERVICAL SPINE WITHOUT CONTRAST TECHNIQUE: Multidetector CT imaging of the head and cervical spine was performed following the standard protocol without intravenous contrast. Multiplanar CT image reconstructions of the cervical spine were also generated. COMPARISON:  04/28/2016 FINDINGS: CT HEAD FINDINGS Brain: Stable calcifications in the lentiform nuclei bilaterally, likely physiologic. Otherwise, the brainstem, cerebellum, cerebral peduncles, thalami, basal ganglia, basilar cisterns, and ventricular system appear within normal limits. Periventricular white matter and corona radiata hypodensities favor chronic ischemic microvascular white matter disease. No intracranial hemorrhage, mass lesion, or acute CVA. Vascular: There is atherosclerotic calcification of the cavernous carotid arteries bilaterally. Skull: Unremarkable Sinuses/Orbits: Complete opacification of the left sphenoid sinus. Chronic ethmoid sinusitis. Other: No supplemental non-categorized findings. CT CERVICAL  SPINE FINDINGS Alignment: Mild reversal the normal cervical lordosis. 1.5 mm degenerative anterolisthesis at C3-4. Skull base and vertebrae: Spurring and loss of articular space at the anterior C1- 2 articulation. Degenerative loss of intervertebral disc height at C3-4 and C4-5. No acute cervical spine fracture. Soft tissues and spinal canal: Confluent nodularity in the somewhat and large left thyroid gland. Right thyroid lobe is absent. Rightward deviation of the trachea. Disc levels: Uncinate and facet spurring cause at least moderate right foraminal stenosis at C4-5, and borderline left foraminal stenosis at C3-4 and C4-5. Upper chest: Unremarkable Other: No supplemental non-categorized findings. IMPRESSION: 1. No acute intracranial findings are acute cervical spine findings. 2. Paranasal sinusitis with complete opacification of the left sphenoid sinus and chronic because all thickening in the ethmoid air cells. 3. Cervical spondylosis, causing right foraminal impingement at C4-5. 4. Prior right thyroid lobe resection. Nodular and enlarged left thyroid lobe. This has been previously worked up for example on prior thyroid ultrasound 08/04/2012. Electronically Signed   By: Van Clines M.D.   On: 04/27/2017 17:12   Ct Cervical Spine Wo Contrast  Result Date: 04/27/2017 CLINICAL DATA:  Fall this morning, found down. Bruising along the back of the head. EXAM: CT HEAD WITHOUT CONTRAST CT CERVICAL SPINE WITHOUT CONTRAST TECHNIQUE: Multidetector CT imaging of the head and cervical spine was performed following the standard protocol without intravenous contrast. Multiplanar CT image reconstructions of the cervical spine were also generated. COMPARISON:  04/28/2016 FINDINGS: CT HEAD FINDINGS Brain: Stable calcifications in the lentiform nuclei bilaterally, likely physiologic. Otherwise, the brainstem, cerebellum, cerebral peduncles, thalami, basal ganglia, basilar cisterns, and ventricular system appear within  normal limits. Periventricular white matter and corona radiata hypodensities favor chronic ischemic microvascular white matter disease. No intracranial hemorrhage, mass lesion, or acute CVA. Vascular: There is atherosclerotic calcification of the cavernous carotid arteries bilaterally. Skull: Unremarkable Sinuses/Orbits: Complete opacification of the left sphenoid sinus. Chronic ethmoid sinusitis. Other: No supplemental non-categorized findings. CT CERVICAL SPINE FINDINGS Alignment: Mild reversal the normal cervical lordosis. 1.5 mm degenerative anterolisthesis at C3-4. Skull base and vertebrae: Spurring and loss of articular space at the anterior C1- 2 articulation. Degenerative loss of intervertebral disc height at C3-4 and C4-5. No acute cervical spine fracture. Soft tissues and spinal canal: Confluent nodularity in the somewhat and large left thyroid gland. Right thyroid lobe is absent. Rightward deviation of the trachea. Disc levels: Uncinate and facet spurring cause at least moderate right foraminal stenosis at C4-5, and borderline left foraminal stenosis at C3-4 and C4-5. Upper chest: Unremarkable Other: No supplemental non-categorized findings. IMPRESSION: 1. No acute intracranial findings are acute cervical spine findings. 2. Paranasal sinusitis with complete opacification of the  left sphenoid sinus and chronic because all thickening in the ethmoid air cells. 3. Cervical spondylosis, causing right foraminal impingement at C4-5. 4. Prior right thyroid lobe resection. Nodular and enlarged left thyroid lobe. This has been previously worked up for example on prior thyroid ultrasound 08/04/2012. Electronically Signed   By: Van Clines M.D.   On: 04/27/2017 17:12   Ct Thoracic Spine Wo Contrast  Result Date: 04/27/2017 CLINICAL DATA:  Trip and fall at home. Possible multiple recent falls. EXAM: CT THORACIC AND LUMBAR SPINE WITHOUT CONTRAST TECHNIQUE: Multidetector CT imaging of the thoracic and lumbar  spine was performed without contrast. Multiplanar CT image reconstructions were also generated. COMPARISON:  Lumbar spine radiographs November 29, 2016 and chest radiograph July 27 , 2018 FINDINGS: CT THORACIC SPINE FINDINGS ALIGNMENT: Maintained thoracic lordosis. No malalignment. VERTEBRAE: Moderate to severe T11 burst fracture with 50-75% height loss, 2 mm retropulsed bony fragments. Fracture was present July 2018, however fracture line present along superior endplate. The remaining thoracic vertebral bodies intact. Intervertebral disc heights generally preserved, multilevel intradiscal mineralization. Multilevel endplate sclerosis and spurring. Osteopenia. Scattered old Schmorl's nodes. Multilevel mild facet arthropathy. PARASPINAL AND OTHER SOFT TISSUES: Partially imaged thyromegaly displacing the trachea to the RIGHT. Calcific atherosclerosis aortic arch. Cardiomegaly. Small RIGHT pleural effusion. DISC LEVELS: Mild canal stenosis T10-11 due to retropulsed bony fragments. No osseous neural foraminal narrowing at any level. CT LUMBAR SPINE FINDINGS SEGMENTATION: For the purposes of this report the last well-formed intervertebral disc space is reported as L5-S1. ALIGNMENT: Maintained lumbar lordosis. No malalignment. VERTEBRAE: Vertebral bodies and posterior elements are intact. Intervertebral disc heights preserved, multilevel vacuum disc. No destructive bony lesions. Osteopenia. PARASPINAL AND OTHER SOFT TISSUES: Included prevertebral and paraspinal soft tissues are unremarkable. DISC LEVELS: L1-2: No disc bulge, canal stenosis nor neural foraminal narrowing. L2-3: Small LEFT extraforaminal disc protrusion without canal stenosis or neural foraminal narrowing. L3-4: Moderate LEFT extraforaminal disc protrusion without canal stenosis. Moderate LEFT neural foraminal narrowing. L4-5: Small broad-based disc bulge, mild facet arthropathy and ligamentum flavum redundancy without canal stenosis or neural foraminal  narrowing. L5-S1: Small broad-based disc bulge, mild facet arthropathy and ligamentum flavum redundancy without canal stenosis or neural foraminal narrowing. IMPRESSION: CT THORACIC SPINE IMPRESSION 1. Subacute T11 moderate to severe burst fracture resulting in mild canal stenosis. No malalignment. 2. Osteopenia. 3. Thyromegaly displacing the trachea to the RIGHT, incompletely characterized. CT LUMBAR SPINE IMPRESSION 1. No fracture or malalignment. 2. Osteopenia. 3. L3-4 disc protrusion results in moderate LEFT L3-4 neural foraminal narrowing. No canal stenosis at any level. Aortic Atherosclerosis (ICD10-I70.0). Electronically Signed   By: Elon Alas M.D.   On: 04/27/2017 17:06   Ct Lumbar Spine Wo Contrast  Result Date: 04/27/2017 CLINICAL DATA:  Trip and fall at home. Possible multiple recent falls. EXAM: CT THORACIC AND LUMBAR SPINE WITHOUT CONTRAST TECHNIQUE: Multidetector CT imaging of the thoracic and lumbar spine was performed without contrast. Multiplanar CT image reconstructions were also generated. COMPARISON:  Lumbar spine radiographs November 29, 2016 and chest radiograph July 27 , 2018 FINDINGS: CT THORACIC SPINE FINDINGS ALIGNMENT: Maintained thoracic lordosis. No malalignment. VERTEBRAE: Moderate to severe T11 burst fracture with 50-75% height loss, 2 mm retropulsed bony fragments. Fracture was present July 2018, however fracture line present along superior endplate. The remaining thoracic vertebral bodies intact. Intervertebral disc heights generally preserved, multilevel intradiscal mineralization. Multilevel endplate sclerosis and spurring. Osteopenia. Scattered old Schmorl's nodes. Multilevel mild facet arthropathy. PARASPINAL AND OTHER SOFT TISSUES: Partially imaged thyromegaly displacing the trachea to  the RIGHT. Calcific atherosclerosis aortic arch. Cardiomegaly. Small RIGHT pleural effusion. DISC LEVELS: Mild canal stenosis T10-11 due to retropulsed bony fragments. No osseous neural  foraminal narrowing at any level. CT LUMBAR SPINE FINDINGS SEGMENTATION: For the purposes of this report the last well-formed intervertebral disc space is reported as L5-S1. ALIGNMENT: Maintained lumbar lordosis. No malalignment. VERTEBRAE: Vertebral bodies and posterior elements are intact. Intervertebral disc heights preserved, multilevel vacuum disc. No destructive bony lesions. Osteopenia. PARASPINAL AND OTHER SOFT TISSUES: Included prevertebral and paraspinal soft tissues are unremarkable. DISC LEVELS: L1-2: No disc bulge, canal stenosis nor neural foraminal narrowing. L2-3: Small LEFT extraforaminal disc protrusion without canal stenosis or neural foraminal narrowing. L3-4: Moderate LEFT extraforaminal disc protrusion without canal stenosis. Moderate LEFT neural foraminal narrowing. L4-5: Small broad-based disc bulge, mild facet arthropathy and ligamentum flavum redundancy without canal stenosis or neural foraminal narrowing. L5-S1: Small broad-based disc bulge, mild facet arthropathy and ligamentum flavum redundancy without canal stenosis or neural foraminal narrowing. IMPRESSION: CT THORACIC SPINE IMPRESSION 1. Subacute T11 moderate to severe burst fracture resulting in mild canal stenosis. No malalignment. 2. Osteopenia. 3. Thyromegaly displacing the trachea to the RIGHT, incompletely characterized. CT LUMBAR SPINE IMPRESSION 1. No fracture or malalignment. 2. Osteopenia. 3. L3-4 disc protrusion results in moderate LEFT L3-4 neural foraminal narrowing. No canal stenosis at any level. Aortic Atherosclerosis (ICD10-I70.0). Electronically Signed   By: Elon Alas M.D.   On: 04/27/2017 17:06   Dg Chest Portable 1 View  Result Date: 04/27/2017 CLINICAL DATA:  Status post fall today.  Weakness. EXAM: PORTABLE CHEST 1 VIEW COMPARISON:  March 01, 2017 FINDINGS: The mediastinal contour is stable. The heart size is enlarged. Mild diffuse increased pulmonary interstitium is identified bilaterally. Chronic  elevation of right hemidiaphragm is noted. There is no focal pneumonia or pleural effusion. The bony structures are stable. IMPRESSION: Mild increased pulmonary interstitium bilaterally which can be seen in mild interstitial edema. Cardiomegaly. Electronically Signed   By: Abelardo Diesel M.D.   On: 04/27/2017 17:10   Dg Shoulder Left  Result Date: 04/27/2017 CLINICAL DATA:  Status post fall today, pain of left shoulder. EXAM: LEFT SHOULDER - 2+ VIEW COMPARISON:  None. FINDINGS: There is mild displaced fracture of the proximal left humerus at the humeral neck. The visualized ribs and left lung are normal. IMPRESSION: Displaced fracture of proximal left humerus. Electronically Signed   By: Abelardo Diesel M.D.   On: 04/27/2017 17:11   Dg Humerus Left  Result Date: 04/27/2017 CLINICAL DATA:  Status post fall today with left shoulder pain. EXAM: LEFT HUMERUS - 2+ VIEW COMPARISON:  None. FINDINGS: There is displaced fracture of the proximal left humerus. There is no dislocation. IMPRESSION: Fracture proximal left humerus. Electronically Signed   By: Abelardo Diesel M.D.   On: 04/27/2017 17:12      Assessment & Plan: Ms. CRESTA RIDEN is a 83 y.o. white female with congestive heart failure systolic, hypertension, anemia, hypothyroidism, GERD, hyperlipidemia, GERD, coronary artery disease, who was admitted to [facility] on 04/27/2017 for Hyponatremia [E87.1] Closed stable burst fracture of twelfth thoracic vertebra with routine healing, subsequent encounter [S22.081D] Closed fracture of proximal end of left humerus, unspecified fracture morphology, initial encounter [S42.202A]   1. Hyponatremia: acute on chronic. Hypervolemic. Hypo-osmolar.  Status post hypertonic saline last night.  Secondary to congestive heart failure.  - fluid restriction - tolvaptan 15mg  PO   2. Hypertension and systolic congestive heart failure: acute exacerbation - fluid restriction  3. Anemia: hemoglobin 8.8. Normocytic.  -  may need  epo.    LOS: 1 Telecia Larocque 9/26/201811:09 AM

## 2017-04-28 NOTE — Consult Note (Signed)
ORTHOPAEDIC CONSULTATION  REQUESTING PHYSICIAN: Henreitta Leber, MD  Chief Complaint:   Left shoulder pain.  History of Present Illness: Kimberly [name] is a 83 y.o. female with a history of multiple medical problems including coronary artery disease, hypertension, hyperlipidemia, and hypothyroidism who apparently lost her balance and fell while at home in an unwitnessed fall. She was found on the floor by her family brought to the emergency room where she was complaining of left shoulder pain. X-rays in the emergency room demonstrated a nondisplaced left proximal humerus fracture. The patient also was noted to have other medical issues, including a low sodium and fluid retention, and so was admitted for management of these issues. The patient denies any associated injuries resulting from the fall. She did not strike her head or lose consciousness.  Past Medical History:  Diagnosis Date  . Allergy    allergic rhinitis  . Chronic combined systolic and diastolic CHF (congestive heart failure) ([place])    a. 01/2014 Echo: EF 50-55%, no rwma, Gr1 DD, triv AI, mild MR;  b. 01/2017 Echo: EF 35%, diff HK, gr2  DD, fibrocalcific AoV w/o AS, mild AI/MR, sev dil LA, PASP 76mmHg.  Marland Kitchen GERD (gastroesophageal reflux disease)   . Hyperlipidemia   . Hypertension   . Hypothyroid   . Insomnia   . Non-obstructive CAD (coronary artery disease)    a. Non-obstructive dzs by caths in 2001 and 2006;  b. 03/2017 Cath: LM nl, LAD 30p, LCX nl, RCA 43m/d, EF 45-50%.   Past Surgical History:  Procedure Laterality Date  . [place] CATH AND CORONARY ANGIOGRAPHY N/A 03/03/2017   Procedure: Left Heart Cath and Coronary Angiography;  Surgeon: Minna Merritts, MD;  Location: [facility];  Service: Cardiovascular;  Laterality: N/A;  . OOPHORECTOMY    . THYROID SURGERY     Social History    Social History  . Marital status: Widowed    Spouse name: N/A  . Number of children: N/A  . Years of education: N/A   Social History Main Topics  . Smoking status: Never Smoker  . Smokeless tobacco: Never Used  . Alcohol use No  . Drug use: No  . Sexual activity: No   Other Topics Concern  . None   Social History Narrative  . None   Family History  Problem Relation Age of Onset  . Heart disease Mother   . Hypertension Mother    Allergies  Allergen Reactions  . Ciprofloxacin Other (See Comments)    REACTION: burning of mouth and skin  . Lisinopril Cough  . Remeron [Mirtazapine] Other (See Comments)    constipation  . Sulfonamide Derivatives Other (See Comments)    REACTION: malaise  . Tetanus Toxoid Swelling  . Vesicare [Solifenacin Succinate] Other (See Comments)    Blurred vision and weakness  . Zoloft [Sertraline Hcl] Nausea And Vomiting  . Cephalexin Rash and Other (See Comments)    REACTION: rash and burning sensation to skin on arms and legs with itching.  Madelin Headings [Amitriptyline] Palpitations  . Penicillins Rash and Other (See Comments)    Has patient had a PCN reaction causing immediate rash, facial/tongue/throat swelling, SOB or lightheadedness with hypotension: Unknown Has patient had a PCN reaction causing severe rash involving mucus membranes or skin necrosis: Unknown Has patient had a PCN reaction that required hospitalization: Unknown Has patient had a PCN  reaction occurring within the last 10 years: No If all of the above answers are "NO", then may proceed with Cephalosporin use. Has since take Augmentin with no problem   Prior to Admission medications   Medication Sig Start Date End Date Taking? Authorizing Provider  aspirin 81 MG tablet Take 81 mg by mouth daily.     Yes [provider]  carvedilol (COREG) 3.125 MG tablet Take 1 tablet (3.125 mg total) by mouth 2 (two) times daily with a meal. Please keep 05/20/17 appt for future refills  04/27/17  Yes Gollan, Kathlene November, MD  cetirizine (ZYRTEC) 10 MG tablet Take 10 mg by mouth daily as needed for allergies.    Yes [provider]  docusate sodium 100 MG CAPS Take 100 mg by mouth 2 (two) times daily. For constipation 02/02/14  Yes Rai, Ripudeep K, MD  esomeprazole (NEXIUM) 40 MG capsule TAKE 1 CAPSULE (40 MG TOTAL) BY MOUTH DAILY. 10/12/16  Yes Tower, Wynelle Fanny, MD  fluticasone (FLONASE) 50 MCG/ACT nasal spray Place 2 sprays into both nostrils daily. 04/15/17  Yes Ria Bush, MD  furosemide (LASIX) 40 MG tablet Take 0.5 tablets (20 mg total) by mouth daily. Patient taking differently: Take 40 mg by mouth daily.  03/29/17  Yes Rogelia Mire, NP  guaiFENesin (MUCINEX) 600 MG 12 hr tablet Take 600 mg by mouth 2 (two) times daily as needed for cough or to loosen phlegm.    Yes [provider]  levothyroxine (SYNTHROID, LEVOTHROID) 25 MCG tablet Take 1 tablet (25 mcg total) by mouth daily. 06/16/16  Yes Tower, Wynelle Fanny, MD  losartan (COZAAR) 25 MG tablet TAKE 1 TABLET BY MOUTH EVERY DAY 03/25/17  Yes Gollan, Kathlene November, MD  Multiple Vitamin (MULTIVITAMIN WITH MINERALS) TABS tablet Take 1 tablet by mouth daily.   Yes [provider]  spironolactone (ALDACTONE) 25 MG tablet TAKE 1 TABLET BY MOUTH EVERY DAY 03/29/17  Yes Gollan, Kathlene November, MD  zolpidem (AMBIEN) 10 MG tablet TAKE 1/2 TO 1 TABLET BY MOUTH DAILY AT BEDTIME AS NEEDED 02/18/17  Yes Tower, Wynelle Fanny, MD   Ct Head Wo Contrast  Result Date: 04/27/2017 CLINICAL DATA:  Fall this morning, found down. Bruising along the back of the head. EXAM: CT HEAD WITHOUT CONTRAST CT CERVICAL SPINE WITHOUT CONTRAST TECHNIQUE: Multidetector CT imaging of the head and cervical spine was performed following the standard protocol without intravenous contrast. Multiplanar CT image reconstructions of the cervical spine were also generated. COMPARISON:  04/28/2016 FINDINGS: CT HEAD FINDINGS Brain: Stable calcifications in the  lentiform nuclei bilaterally, likely physiologic. Otherwise, the brainstem, cerebellum, cerebral peduncles, thalami, basal ganglia, basilar cisterns, and ventricular system appear within normal limits. Periventricular white matter and corona radiata hypodensities favor chronic ischemic microvascular white matter disease. No intracranial hemorrhage, mass lesion, or acute CVA. Vascular: There is atherosclerotic calcification of the cavernous carotid arteries bilaterally. Skull: Unremarkable Sinuses/Orbits: Complete opacification of the left sphenoid sinus. Chronic ethmoid sinusitis. Other: No supplemental non-categorized findings. CT CERVICAL SPINE FINDINGS Alignment: Mild reversal the normal cervical lordosis. 1.5 mm degenerative anterolisthesis at C3-4. Skull base and vertebrae: Spurring and loss of articular space at the anterior C1- 2 articulation. Degenerative loss of intervertebral disc height at C3-4 and C4-5. No acute cervical spine fracture. Soft tissues and spinal canal: Confluent nodularity in the somewhat and large left thyroid gland. Right thyroid lobe is absent. Rightward deviation of the trachea. Disc levels: Uncinate and facet spurring cause at least moderate right foraminal stenosis  at C4-5, and borderline left foraminal stenosis at C3-4 and C4-5. Upper chest: Unremarkable Other: No supplemental non-categorized findings. IMPRESSION: 1. No acute intracranial findings are acute cervical spine findings. 2. Paranasal sinusitis with complete opacification of the left sphenoid sinus and chronic because all thickening in the ethmoid air cells. 3. Cervical spondylosis, causing right foraminal impingement at C4-5. 4. Prior right thyroid lobe resection. Nodular and enlarged left thyroid lobe. This has been previously worked up for example on prior thyroid ultrasound 08/04/2012. Electronically Signed   By: Van Clines M.D.   On: 04/27/2017 17:12   Ct Cervical Spine Wo Contrast  Result Date:  04/27/2017 CLINICAL DATA:  Fall this morning, found down. Bruising along the back of the head. EXAM: CT HEAD WITHOUT CONTRAST CT CERVICAL SPINE WITHOUT CONTRAST TECHNIQUE: Multidetector CT imaging of the head and cervical spine was performed following the standard protocol without intravenous contrast. Multiplanar CT image reconstructions of the cervical spine were also generated. COMPARISON:  04/28/2016 FINDINGS: CT HEAD FINDINGS Brain: Stable calcifications in the lentiform nuclei bilaterally, likely physiologic. Otherwise, the brainstem, cerebellum, cerebral peduncles, thalami, basal ganglia, basilar cisterns, and ventricular system appear within normal limits. Periventricular white matter and corona radiata hypodensities favor chronic ischemic microvascular white matter disease. No intracranial hemorrhage, mass lesion, or acute CVA. Vascular: There is atherosclerotic calcification of the cavernous carotid arteries bilaterally. Skull: Unremarkable Sinuses/Orbits: Complete opacification of the left sphenoid sinus. Chronic ethmoid sinusitis. Other: No supplemental non-categorized findings. CT CERVICAL SPINE FINDINGS Alignment: Mild reversal the normal cervical lordosis. 1.5 mm degenerative anterolisthesis at C3-4. Skull base and vertebrae: Spurring and loss of articular space at the anterior C1- 2 articulation. Degenerative loss of intervertebral disc height at C3-4 and C4-5. No acute cervical spine fracture. Soft tissues and spinal canal: Confluent nodularity in the somewhat and large left thyroid gland. Right thyroid lobe is absent. Rightward deviation of the trachea. Disc levels: Uncinate and facet spurring cause at least moderate right foraminal stenosis at C4-5, and borderline left foraminal stenosis at C3-4 and C4-5. Upper chest: Unremarkable Other: No supplemental non-categorized findings. IMPRESSION: 1. No acute intracranial findings are acute cervical spine findings. 2. Paranasal sinusitis with complete  opacification of the left sphenoid sinus and chronic because all thickening in the ethmoid air cells. 3. Cervical spondylosis, causing right foraminal impingement at C4-5. 4. Prior right thyroid lobe resection. Nodular and enlarged left thyroid lobe. This has been previously worked up for example on prior thyroid ultrasound 08/04/2012. Electronically Signed   By: Van Clines M.D.   On: 04/27/2017 17:12   Ct Thoracic Spine Wo Contrast  Result Date: 04/27/2017 CLINICAL DATA:  Trip and fall at home. Possible multiple recent falls. EXAM: CT THORACIC AND LUMBAR SPINE WITHOUT CONTRAST TECHNIQUE: Multidetector CT imaging of the thoracic and lumbar spine was performed without contrast. Multiplanar CT image reconstructions were also generated. COMPARISON:  Lumbar spine radiographs November 29, 2016 and chest radiograph July 27 , 2018 FINDINGS: CT THORACIC SPINE FINDINGS ALIGNMENT: Maintained thoracic lordosis. No malalignment. VERTEBRAE: Moderate to severe T11 burst fracture with 50-75% height loss, 2 mm retropulsed bony fragments. Fracture was present July 2018, however fracture line present along superior endplate. The remaining thoracic vertebral bodies intact. Intervertebral disc heights generally preserved, multilevel intradiscal mineralization. Multilevel endplate sclerosis and spurring. Osteopenia. Scattered old Schmorl's nodes. Multilevel mild facet arthropathy. PARASPINAL AND OTHER SOFT TISSUES: Partially imaged thyromegaly displacing the trachea to the RIGHT. Calcific atherosclerosis aortic arch. Cardiomegaly. Small RIGHT pleural effusion. DISC LEVELS: Mild canal stenosis  T10-11 due to retropulsed bony fragments. No osseous neural foraminal narrowing at any level. CT LUMBAR SPINE FINDINGS SEGMENTATION: For the purposes of this report the last well-formed intervertebral disc space is reported as L5-S1. ALIGNMENT: Maintained lumbar lordosis. No malalignment. VERTEBRAE: Vertebral bodies and posterior elements  are intact. Intervertebral disc heights preserved, multilevel vacuum disc. No destructive bony lesions. Osteopenia. PARASPINAL AND OTHER SOFT TISSUES: Included prevertebral and paraspinal soft tissues are unremarkable. DISC LEVELS: L1-2: No disc bulge, canal stenosis nor neural foraminal narrowing. L2-3: Small LEFT extraforaminal disc protrusion without canal stenosis or neural foraminal narrowing. L3-4: Moderate LEFT extraforaminal disc protrusion without canal stenosis. Moderate LEFT neural foraminal narrowing. L4-5: Small broad-based disc bulge, mild facet arthropathy and ligamentum flavum redundancy without canal stenosis or neural foraminal narrowing. L5-S1: Small broad-based disc bulge, mild facet arthropathy and ligamentum flavum redundancy without canal stenosis or neural foraminal narrowing. IMPRESSION: CT THORACIC SPINE IMPRESSION 1. Subacute T11 moderate to severe burst fracture resulting in mild canal stenosis. No malalignment. 2. Osteopenia. 3. Thyromegaly displacing the trachea to the RIGHT, incompletely characterized. CT LUMBAR SPINE IMPRESSION 1. No fracture or malalignment. 2. Osteopenia. 3. L3-4 disc protrusion results in moderate LEFT L3-4 neural foraminal narrowing. No canal stenosis at any level. Aortic Atherosclerosis (ICD10-I70.0). Electronically Signed   By: Elon Alas M.D.   On: 04/27/2017 17:06   Ct Lumbar Spine Wo Contrast  Result Date: 04/27/2017 CLINICAL DATA:  Trip and fall at home. Possible multiple recent falls. EXAM: CT THORACIC AND LUMBAR SPINE WITHOUT CONTRAST TECHNIQUE: Multidetector CT imaging of the thoracic and lumbar spine was performed without contrast. Multiplanar CT image reconstructions were also generated. COMPARISON:  Lumbar spine radiographs November 29, 2016 and chest radiograph July 27 , 2018 FINDINGS: CT THORACIC SPINE FINDINGS ALIGNMENT: Maintained thoracic lordosis. No malalignment. VERTEBRAE: Moderate to severe T11 burst fracture with 50-75% height loss, 2  mm retropulsed bony fragments. Fracture was present July 2018, however fracture line present along superior endplate. The remaining thoracic vertebral bodies intact. Intervertebral disc heights generally preserved, multilevel intradiscal mineralization. Multilevel endplate sclerosis and spurring. Osteopenia. Scattered old Schmorl's nodes. Multilevel mild facet arthropathy. PARASPINAL AND OTHER SOFT TISSUES: Partially imaged thyromegaly displacing the trachea to the RIGHT. Calcific atherosclerosis aortic arch. Cardiomegaly. Small RIGHT pleural effusion. DISC LEVELS: Mild canal stenosis T10-11 due to retropulsed bony fragments. No osseous neural foraminal narrowing at any level. CT LUMBAR SPINE FINDINGS SEGMENTATION: For the purposes of this report the last well-formed intervertebral disc space is reported as L5-S1. ALIGNMENT: Maintained lumbar lordosis. No malalignment. VERTEBRAE: Vertebral bodies and posterior elements are intact. Intervertebral disc heights preserved, multilevel vacuum disc. No destructive bony lesions. Osteopenia. PARASPINAL AND OTHER SOFT TISSUES: Included prevertebral and paraspinal soft tissues are unremarkable. DISC LEVELS: L1-2: No disc bulge, canal stenosis nor neural foraminal narrowing. L2-3: Small LEFT extraforaminal disc protrusion without canal stenosis or neural foraminal narrowing. L3-4: Moderate LEFT extraforaminal disc protrusion without canal stenosis. Moderate LEFT neural foraminal narrowing. L4-5: Small broad-based disc bulge, mild facet arthropathy and ligamentum flavum redundancy without canal stenosis or neural foraminal narrowing. L5-S1: Small broad-based disc bulge, mild facet arthropathy and ligamentum flavum redundancy without canal stenosis or neural foraminal narrowing. IMPRESSION: CT THORACIC SPINE IMPRESSION 1. Subacute T11 moderate to severe burst fracture resulting in mild canal stenosis. No malalignment. 2. Osteopenia. 3. Thyromegaly displacing the trachea to the  RIGHT, incompletely characterized. CT LUMBAR SPINE IMPRESSION 1. No fracture or malalignment. 2. Osteopenia. 3. L3-4 disc protrusion results in moderate LEFT L3-4 neural foraminal narrowing.  No canal stenosis at any level. Aortic Atherosclerosis (ICD10-I70.0). Electronically Signed   By: Elon Alas M.D.   On: 04/27/2017 17:06   Dg Chest Portable 1 View  Result Date: 04/27/2017 CLINICAL DATA:  Status post fall today.  Weakness. EXAM: PORTABLE CHEST 1 VIEW COMPARISON:  March 01, 2017 FINDINGS: The mediastinal contour is stable. The heart size is enlarged. Mild diffuse increased pulmonary interstitium is identified bilaterally. Chronic elevation of right hemidiaphragm is noted. There is no focal pneumonia or pleural effusion. The bony structures are stable. IMPRESSION: Mild increased pulmonary interstitium bilaterally which can be seen in mild interstitial edema. Cardiomegaly. Electronically Signed   By: Abelardo Diesel M.D.   On: 04/27/2017 17:10   Dg Shoulder Left  Result Date: 04/27/2017 CLINICAL DATA:  Status post fall today, pain of left shoulder. EXAM: LEFT SHOULDER - 2+ VIEW COMPARISON:  None. FINDINGS: There is mild displaced fracture of the proximal left humerus at the humeral neck. The visualized ribs and left lung are normal. IMPRESSION: Displaced fracture of proximal left humerus. Electronically Signed   By: Abelardo Diesel M.D.   On: 04/27/2017 17:11   Dg Humerus Left  Result Date: 04/27/2017 CLINICAL DATA:  Status post fall today with left shoulder pain. EXAM: LEFT HUMERUS - 2+ VIEW COMPARISON:  None. FINDINGS: There is displaced fracture of the proximal left humerus. There is no dislocation. IMPRESSION: Fracture proximal left humerus. Electronically Signed   By: Abelardo Diesel M.D.   On: 04/27/2017 17:12    Positive ROS: All other systems have been reviewed and were otherwise negative with the exception of those mentioned in the HPI and as above.  Physical Exam: General:  Alert, no  acute distress Psychiatric:  Patient is competent for consent with normal mood and affect   Cardiovascular:  No pedal edema Respiratory:  No wheezing, non-labored breathing GI:  Abdomen is soft and non-tender Skin:  No lesions in the area of chief complaint Neurologic:  Sensation intact distally Lymphatic:  No axillary or cervical lymphadenopathy  Orthopedic Exam:  Orthopedic examination is limited to the left shoulder and upper extremity. Skin inspection around the left shoulder is unremarkable, other than for some mild swelling. There is no erythema, ecchymosis, abrasions, or other skin violations noted. She has mild tenderness to palpation of the anterolateral aspects of the shoulder. She has more moderate pain with any attempted active or passive motion of the shoulder. She is able to actively flex and extend all digits, as well as her wrist. She is neurovascularly intact to her left upper extremity and hand, including the musculocutaneous and axillary nerve distributions.  X-rays:  X-rays of the left shoulder are available for review. These films demonstrate an essentially nondisplaced 2 part surgical neck fracture of the left proximal humerus. The humeral head is concentrically located within the glenoid. No lytic lesions or significant degenerative changes are noted.  Assessment: Essentially nondisplaced 2 part surgical neck fracture of left proximal humerus.  Plan: The treatment options are discussed with the patient and her daughter who is at the bedside. It is explained to them that this is not an injury that requires surgical intervention and that it can be managed nonsurgically. She will remain in her shoulder sling for 4-6 weeks before beginning physical therapy to work on range of motion and strength. She may remove the sling for bathing purposes only. Appropriate pain medication may be provided as needed.  Thank you for asking me to participate in the care of this most  pleasant  woman. I will be happy to see her in follow-up in our office in 1 month for re-evaluation with either myself or Cameron Proud, PA-C, at which time we will obtain repeat x-rays to assess the healing of her fracture.   Pascal Lux, MD  Beeper #:  [phone]  04/28/2017 12:02 PM

## 2017-04-28 NOTE — Progress Notes (Signed)
PT Cancellation Note  Patient Details Name: Kimberly [name] MRN: [id number] DOB: March 05, 1928   Cancelled Treatment:    Reason Eval/Treat Not Completed: Medical issues which prohibited therapy; Pt with L humerus fracture with Ortho/surgical consult pending.  Will hold PT eval at this time pending results of Ortho consult and receipt of Ortho plan of care and any pt restrictions.    Linus Salmons PT, DPT 04/28/17, 11:44 AM

## 2017-04-28 NOTE — Consult Note (Signed)
Referring Physician:  No referring provider defined for this encounter.  Primary Physician:  Tower, Wynelle Fanny, MD  Chief Complaint:  Back pain/T11 burst fracture  History of Present Illness: Kimberly [name] is a 83 y.o. female who presents as an inpatient consult for T11 burst fracture. Compression fracture was seen on prior imaging in July 2018. Patient states fracture has been present for "as long as she can remember". Complains of only mild back pain that has worsened minimally since her fall a couple of days ago. Moving in bed worsens the pain; resting relieves it.  Hasn't gotten out of bed or ambulated since admit. Complains more of left arm pain due to humerus fracture that has been evaluated.  Denies upper or lower extremity pain or paresthesia, saddle paresthesia, bladder or bowel dysfunction.   Review of Systems:  A 10 point review of systems is negative, except for the pertinent positives and negatives detailed in the HPI.  Past Medical History: Past Medical History:  Diagnosis Date  . Allergy    allergic rhinitis  . Chronic combined systolic and diastolic CHF (congestive heart failure) ([place])    a. 01/2014 Echo: EF 50-55%, no rwma, Gr1 DD, triv AI, mild MR;  b. 01/2017 Echo: EF 35%, diff HK, gr2  DD, fibrocalcific AoV w/o AS, mild AI/MR, sev dil LA, PASP 17mmHg.  Marland Kitchen GERD (gastroesophageal reflux disease)   . Hyperlipidemia   . Hypertension   . Hypothyroid   . Insomnia   . Non-obstructive CAD (coronary artery disease)    a. Non-obstructive dzs by caths in 2001 and 2006;  b. 03/2017 Cath: LM nl, LAD 30p, LCX nl, RCA 33m/d, EF 45-50%.    Past Surgical History: Past Surgical History:  Procedure Laterality Date  . [place] CATH AND CORONARY ANGIOGRAPHY N/A 03/03/2017   Procedure: Left Heart Cath and Coronary Angiography;  Surgeon: Minna Merritts, MD;  Location: [facility];  Service: Cardiovascular;   Laterality: N/A;  . OOPHORECTOMY    . THYROID SURGERY      Allergies: Allergies as of 04/27/2017 - Review Complete 04/27/2017  Allergen Reaction Noted  . Ciprofloxacin Other (See Comments) 12/10/2008  . Lisinopril Cough 05/03/2007  . Remeron [mirtazapine] Other (See Comments) 12/04/2011  . Sulfonamide derivatives Other (See Comments) 03/29/2007  . Tetanus toxoid Swelling 03/29/2007  . Vesicare [solifenacin succinate] Other (See Comments) 05/26/2011  . Zoloft [sertraline hcl] Nausea And Vomiting 04/20/2012  . Cephalexin Rash and Other (See Comments)   . Elavil [amitriptyline] Palpitations 12/04/2011  . Penicillins Rash and Other (See Comments) 03/29/2007    Medications:  Current Facility-Administered Medications:  .  acetaminophen (TYLENOL) tablet 650 mg, 650 mg, Oral, Q6H PRN **OR** acetaminophen (TYLENOL) suppository 650 mg, 650 mg, Rectal, Q6H PRN, Sudini, Srikar, MD .  albuterol (PROVENTIL) (2.5 MG/3ML) 0.083% nebulizer solution 2.5 mg, 2.5 mg, Nebulization, Q2H PRN, Sudini, Srikar, MD .  aspirin EC tablet 81 mg, 81 mg, Oral, Daily, Sudini, Srikar, MD, 81 mg at 04/28/17 0847 .  carvedilol (COREG) tablet 3.125 mg, 3.125 mg, Oral, BID WC, Sudini, Srikar, MD, 3.125 mg at 04/28/17 0846 .  docusate sodium (COLACE) capsule 100 mg, 100 mg, Oral, BID, Sudini, Srikar, MD, 100 mg at 04/28/17 0847 .  enoxaparin (LOVENOX) injection 40 mg, 40 mg, Subcutaneous, Q24H, Sudini, Srikar, MD, 40 mg at 04/27/17 2154 .  fluticasone (FLONASE) 50 MCG/ACT nasal spray 2  spray, 2 spray, Each Nare, Daily PRN, Sudini, Srikar, MD .  guaiFENesin (MUCINEX) 12 hr tablet 600 mg, 600 mg, Oral, BID PRN, Sudini, Srikar, MD .  levothyroxine (SYNTHROID, LEVOTHROID) tablet 25 mcg, 25 mcg, Oral, QAC breakfast, Henreitta Leber, MD, 25 mcg at 04/28/17 0846 .  losartan (COZAAR) tablet 25 mg, 25 mg, Oral, Daily, Sudini, Srikar, MD, 25 mg at 04/28/17 0847 .  ondansetron (ZOFRAN) tablet 4 mg, 4 mg, Oral, Q6H PRN **OR**  ondansetron (ZOFRAN) injection 4 mg, 4 mg, Intravenous, Q6H PRN, Sudini, Srikar, MD .  oxyCODONE (Oxy IR/ROXICODONE) immediate release tablet 5 mg, 5 mg, Oral, Q4H PRN, Hillary Bow, MD, 5 mg at 04/27/17 2208 .  pantoprazole (PROTONIX) EC tablet 40 mg, 40 mg, Oral, Daily, Sudini, Srikar, MD, 40 mg at 04/28/17 0847 .  polyethylene glycol (MIRALAX / GLYCOLAX) packet 17 g, 17 g, Oral, Daily PRN, Sudini, Srikar, MD .  sodium chloride flush (NS) 0.9 % injection 3 mL, 3 mL, Intravenous, Q12H, Sudini, Srikar, MD, 3 mL at 04/28/17 0848 .  tolvaptan (SAMSCA) tablet 15 mg, 15 mg, Oral, Daily, Kolluru, Sarath, MD, 15 mg at 04/28/17 1106 .  zolpidem (AMBIEN) tablet 5 mg, 5 mg, Oral, QHS PRN, Hillary Bow, MD, 5 mg at 04/27/17 2156   Social History: Social History  Substance Use Topics  . Smoking status: Never Smoker  . Smokeless tobacco: Never Used  . Alcohol use No    Family Medical History: Family History  Problem Relation Age of Onset  . Heart disease Mother   . Hypertension Mother     Physical Examination: Vitals:   04/28/17 0843 04/28/17 1222  BP: (!) 142/49 (!) 125/46  Pulse: 67 63  Resp: 18 18  Temp: 98.5 F (36.9 C) 99 F (37.2 C)  SpO2: 100% 99%     General: Patient is well developed, well nourished, calm, collected, and in no apparent distress.  Psychiatric: Patient is non-anxious.  Head:  Pupils equal, round, and reactive to light.  Respiratory: Patient is breathing without any difficulty.  Skin:   On exposed skin, there are no abnormal skin lesions.  NEUROLOGICAL:  General: In no acute distress.   Awake, alert, oriented to person, place, and time.  Pupils equal round and reactive to light.  Facial tone is symmetric.  Tongue protrusion is midline.  There is no pronator drift.  ROM of spine: not assessed.  Palpation of spine: nontender.    Strength:  Side Iliopsoas Quads Hamstring PF DF EHL  R 4 4 4 5  4- 4-  L 5 5 5 5  4- 4-   Reflexes are 1+ and symmetric  at the biceps, triceps, brachioradialis, patella and achilles.   Bilateral upper and lower extremity sensation is intact to light touch and pin prick except decreased sensation bilaterally in great toes.  Clonus is not present.  Toes are down-going.   Imaging: EXAM: CT THORACIC AND LUMBAR SPINE WITHOUT CONTRAST  04/27/2017  IMPRESSION: CT THORACIC SPINE IMPRESSION  1. Subacute T11 moderate to severe burst fracture resulting in mild canal stenosis. No malalignment. 2. Osteopenia. 3. Thyromegaly displacing the trachea to the RIGHT, incompletely characterized. CT LUMBAR SPINE IMPRESSION  1. No fracture or malalignment. 2. Osteopenia. 3. L3-4 disc protrusion results in moderate LEFT L3-4 neural foraminal narrowing. No canal stenosis at any level.  Aortic Atherosclerosis (ICD10-I70.0). I have personally reviewed the images and agree with the above interpretation.  EXAM: LUMBAR SPINE - COMPLETE 4+ VIEW   11/29/2016 IMPRESSION: No acute  lumbar spine findings. Degenerative spondylosis, as described above.  Mild wedge compression deformity of T11 vertebral body, which is of indeterminate age. Recommend clinical correlation and consider dedicated thoracic spine radiographs centered at this level.  EXAM: CT HEAD WITHOUT CONTRAST  04/27/2017   CT CERVICAL SPINE WITHOUT CONTRAST IMPRESSION: 1. No acute intracranial findings are acute cervical spine findings. 2. Paranasal sinusitis with complete opacification of the left sphenoid sinus and chronic because all thickening in the ethmoid air cells. 3. Cervical spondylosis, causing right foraminal impingement at C4-5. 4. Prior right thyroid lobe resection. Nodular and enlarged left thyroid lobe. This has been previously worked up for example on prior thyroid ultrasound 08/04/2012.  Assessment and Plan: Ms. Games is a pleasant 83 y.o. female with T11 burst fracture. Fracture has been present for some time, however it may have  been worsened since recent fall. Patient however denies any significant change in back pain. Physical exam revealed right-sided lower extremity weakness with decreased sensation of great toes. Otherwise, no neurological deficits.   Recommend:  TLSO brace at all times when upright.  Follow up in approximately 4 weeks for repeat imaging to monitor stability.  If unable to bear weight due to back pain, consider consult for kyphoplasty.   Marin Olp, PA-C Dept. of Neurosurgery

## 2017-04-29 LAB — BASIC METABOLIC PANEL
Anion gap: 9 (ref 5–15)
BUN: 15 mg/dL (ref 6–20)
CO2: 26 mmol/L (ref 22–32)
CREATININE: 1.06 mg/dL — AB (ref 0.44–1.00)
Calcium: 7.8 mg/dL — ABNORMAL LOW (ref 8.9–10.3)
Chloride: 88 mmol/L — ABNORMAL LOW (ref 101–111)
GFR calc Af Amer: 53 mL/min — ABNORMAL LOW (ref >60)
GFR calc non Af Amer: 46 mL/min — ABNORMAL LOW (ref >60)
Glucose, Bld: 154 mg/dL — ABNORMAL HIGH (ref 65–99)
Potassium: 4.1 mmol/L (ref 3.5–5.1)
SODIUM: 123 mmol/L — AB (ref 135–145)

## 2017-04-29 LAB — SODIUM: Sodium: 123 mmol/L — ABNORMAL LOW (ref 135–145)

## 2017-04-29 MED ORDER — POTASSIUM CHLORIDE CRYS ER 20 MEQ PO TBCR
20.0000 meq | EXTENDED_RELEASE_TABLET | ORAL | Status: AC
  Administered 2017-04-29 (×2): 20 meq via ORAL
  Filled 2017-04-29 (×2): qty 1

## 2017-04-29 NOTE — NC FL2 (Signed)
[place] LEVEL OF CARE SCREENING TOOL     IDENTIFICATION  Patient Name: Kimberly [name] Birthdate: January 02, 1928 Sex: female Admission Date (Current Location): 04/27/2017  Parks and [place] Number:  Engineering geologist and Address:  [facility], [address] [place], [place] [address]      Provider Number: [id number]  Attending Physician Name and Address:  Hillary Bow, MD  Relative Name and Phone Number:       Current Level of Care: Hospital Recommended Level of Care: [place] Prior Approval Number:    Date Approved/Denied:   PASRR Number: [id number]  Discharge Plan: SNF    Current Diagnoses: Patient Active Problem List   Diagnosis Date Noted  . CHF (congestive heart failure) ([place]) 04/27/2017  . Shortness of breath 04/15/2017  . Chronic systolic heart failure ([place]) 03/11/2017  . AKI (acute kidney injury) ([place]) 03/02/2017  . Orthostatic hypotension 03/02/2017  . Frequent PVCs 03/02/2017  . Frequent urination 12/07/2016  . Low back pain 12/07/2016  . Fall 12/07/2016  . Thoracic compression fracture ([place]) 12/07/2016  . Estrogen deficiency 12/07/2016  . Lower extremity edema 06/19/2016  . Carotid atherosclerosis 04/29/2016  . Medicare annual wellness visit, initial 05/27/2015  . Routine general medical examination at a health care facility 05/27/2015  . Colon cancer screening 05/27/2015  . Hyposmolality and/or hyponatremia 05/28/2014  . Hypokalemia 05/28/2014  . Hyponatremia 01/31/2014  . Unspecified constipation 01/31/2014  . Left thyroid nodule 08/04/2012  . Obesity 12/04/2011  . Gastritis 12/04/2011  . Heart palpitations 09/01/2011  . Depression 05/26/2011  . Frequent UTI 12/30/2010  . Lower abdominal pain 11/27/2010  . COLONIC POLYPS 04/09/2010  . Anemia 04/09/2010  . HEMORRHOIDS 03/04/2010  . Anxiety 12/10/2009  . PERSONAL HX COLONIC POLYPS 06/19/2008  . IRRITABLE BOWEL SYNDROME 03/28/2008  .  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 GOITER 03/29/2007  . Hypothyroidism 03/29/2007  . Hyperlipidemia 03/29/2007  . Essential hypertension 03/29/2007  . Coronary atherosclerosis 03/29/2007  . LBBB (left bundle branch block) 03/29/2007  . ALLERGIC RHINITIS 03/29/2007  . GERD 03/29/2007  . HYPERGLYCEMIA 03/29/2007    Orientation RESPIRATION BLADDER Height & Weight     Self, Time, Place, Situation  O2 (1L) Continent Weight: 174 lb 11.2 oz (79.2 kg) Height:  5\' 3"  (160 cm)  BEHAVIORAL SYMPTOMS/MOOD NEUROLOGICAL BOWEL NUTRITION STATUS      Continent Diet (Fluid restriction)  AMBULATORY STATUS COMMUNICATION OF NEEDS Skin   Limited Assist Verbally Normal                       Personal Care Assistance Level of Assistance  Bathing, Feeding, Dressing Bathing Assistance: Limited assistance Feeding assistance: Independent Dressing Assistance: Limited assistance     Functional Limitations Info  Sight, Hearing, Speech Sight Info: Adequate Hearing Info: Adequate Speech Info: Adequate    SPECIAL CARE FACTORS FREQUENCY  PT (By licensed PT)     PT Frequency: 5x a week              Contractures Contractures Info: Not present    Additional Factors Info  Code Status, Allergies Code Status Info: DNR Allergies Info: CIPROFLOXACIN, LISINOPRIL, REMERON MIRTAZAPINE, SULFONAMIDE DERIVATIVES, TETANUS TOXOID, VESICARE SOLIFENACIN SUCCINATE, ZOLOFT SERTRALINE HCL, CEPHALEXIN, ELAVIL AMITRIPTYLINE, PENICILLINS            Current Medications (04/29/2017):  This is the current hospital active medication list Current Facility-Administered Medications  Medication Dose Route Frequency Provider Last Rate Last Dose  .  acetaminophen (TYLENOL) tablet 650 mg  650 mg Oral Q6H PRN Hillary Bow, MD       Or  . acetaminophen (TYLENOL) suppository 650 mg  650 mg Rectal Q6H PRN Sudini, Srikar, MD      . albuterol (PROVENTIL) (2.5 MG/3ML) 0.083% nebulizer solution 2.5 mg  2.5 mg  Nebulization Q2H PRN Sudini, Srikar, MD      . aspirin EC tablet 81 mg  81 mg Oral Daily Hillary Bow, MD   81 mg at 04/29/17 0959  . carvedilol (COREG) tablet 3.125 mg  3.125 mg Oral BID WC Hillary Bow, MD   3.125 mg at 04/29/17 1716  . docusate sodium (COLACE) capsule 100 mg  100 mg Oral BID Hillary Bow, MD   100 mg at 04/29/17 0959  . enoxaparin (LOVENOX) injection 40 mg  40 mg Subcutaneous Q24H Hillary Bow, MD   40 mg at 04/28/17 2105  . fluticasone (FLONASE) 50 MCG/ACT nasal spray 2 spray  2 spray Each Nare Daily PRN Sudini, Alveta Heimlich, MD      . guaiFENesin (MUCINEX) 12 hr tablet 600 mg  600 mg Oral BID PRN Hillary Bow, MD   600 mg at 04/29/17 1006  . levothyroxine (SYNTHROID, LEVOTHROID) tablet 25 mcg  25 mcg Oral QAC breakfast Henreitta Leber, MD   25 mcg at 04/29/17 0755  . losartan (COZAAR) tablet 25 mg  25 mg Oral Daily Hillary Bow, MD   25 mg at 04/29/17 0959  . ondansetron (ZOFRAN) tablet 4 mg  4 mg Oral Q6H PRN Hillary Bow, MD       Or  . ondansetron (ZOFRAN) injection 4 mg  4 mg Intravenous Q6H PRN Sudini, Alveta Heimlich, MD      . oxyCODONE (Oxy IR/ROXICODONE) immediate release tablet 5 mg  5 mg Oral Q4H PRN Hillary Bow, MD   5 mg at 04/29/17 0522  . pantoprazole (PROTONIX) EC tablet 40 mg  40 mg Oral Daily Hillary Bow, MD   40 mg at 04/29/17 0959  . polyethylene glycol (MIRALAX / GLYCOLAX) packet 17 g  17 g Oral Daily PRN Sudini, Srikar, MD      . sodium chloride flush (NS) 0.9 % injection 3 mL  3 mL Intravenous Q12H Sudini, Srikar, MD   3 mL at 04/29/17 1000  . tolvaptan (SAMSCA) tablet 15 mg  15 mg Oral Daily Kolluru, Sarath, MD   15 mg at 04/29/17 0959  . zolpidem (AMBIEN) tablet 5 mg  5 mg Oral QHS PRN Hillary Bow, MD   5 mg at 04/27/17 2156     Discharge Medications: Please see discharge summary for a list of discharge medications.  Relevant Imaging Results:  Relevant Lab Results:   Additional Information SSN [id number]  Ross Ludwig,  [place]

## 2017-04-29 NOTE — Progress Notes (Signed)
Physical Therapy Treatment Patient Details Name: Kimberly [name] MRN: [id number] DOB: Dec 24, 1927 Today's Date: 04/29/2017    History of Present Illness Pt is a 83 y.o.femalewith a known history of chronic systolic CHF with ejection fraction of 35%, hypertension, anemia of chronic disease presents to the emergency room after patient had episode of syncope and was found fallen on the floor by family.  She had pain in left shoulder area. Here in the emergency room patient has been found to have left humerus fracture, sodium severely low at 113. Orthopedics consulted and suggested non-surgical intervention with a sling placed.  Per neuro pt also has a h/o T11 burst fracture with back pain that has worsened minimally since recent fall.    PT Comments    Pt reports not feeling well but willing to participate.  Participated in exercises as described below.  To edge of bed with log roll technique and mod assist.  Stood with mod a x 1 and was able to transfer to recliner at bedside with min/mod a x 1.  Poor use of HW today as she left it stationary and tried to walk around it getting it tangled under her.  Demonstrated proper use.  Remained up in chair but declined further mobility due to general malaise and nausea.    Follow Up Recommendations  SNF     Equipment Recommendations       Recommendations for Other Services       Precautions / Restrictions Precautions Precautions: Fall Required Braces or Orthoses: Sling;Other Brace/Splint Restrictions Weight Bearing Restrictions: No LUE Weight Bearing: Partial weight bearing    Mobility  Bed Mobility Overal bed mobility: Needs Assistance Bed Mobility: Supine to Sit     Supine to sit: Mod assist        Transfers Overall transfer level: Needs assistance Equipment used: Hemi-walker   Sit to Stand: Mod assist            Ambulation/Gait   Ambulation Distance (Feet): 3 Feet Assistive device: Hemi-walker Gait Pattern/deviations:  Step-through pattern;Decreased step length - right;Decreased step length - left   Gait velocity interpretation: <1.8 ft/sec, indicative of risk for recurrent falls General Gait Details: poor use of HW today, gait limited due to fatigue and nausea   Stairs            Wheelchair Mobility    Modified Rankin (Stroke Patients Only)       Balance Overall balance assessment: Needs assistance Sitting-balance support: Feet unsupported;Feet supported;Single extremity supported Sitting balance-Leahy Scale: Fair     Standing balance support: Single extremity supported Standing balance-Leahy Scale: Poor                              Cognition Arousal/Alertness: Awake/alert Behavior During Therapy: WFL for tasks assessed/performed Overall Cognitive Status: Within Functional Limits for tasks assessed                                        Exercises Other Exercises Other Exercises: Supine exercises AAROM x 10 for ankle pumps, heel slides, SLR and ab/add x 10, seated LAQ and marches x 10    General Comments        Pertinent Vitals/Pain Pain Assessment: 0-10 Pain Score: 5  Pain Location: L shoulder Pain Descriptors / Indicators: Aching;Sore Pain Intervention(s): Monitored during session;Limited activity within patient's tolerance  Home Living                      Prior Function            PT Goals (current goals can now be found in the care plan section) Progress towards PT goals: Progressing toward goals    Frequency    7X/week      PT Plan Current plan remains appropriate    Co-evaluation              AM-PAC PT "6 Clicks" Daily Activity  Outcome Measure  Difficulty turning over in bed (including adjusting bedclothes, sheets and blankets)?: Unable Difficulty moving from lying on back to sitting on the side of the bed? : Unable Difficulty sitting down on and standing up from a chair with arms (e.g., wheelchair,  bedside commode, etc,.)?: Unable Help needed moving to and from a bed to chair (including a wheelchair)?: A Little Help needed walking in hospital room?: A Lot Help needed climbing 3-5 steps with a railing? : A Lot 6 Click Score: 10    End of Session Equipment Utilized During Treatment: Gait belt;Oxygen Activity Tolerance: Patient limited by fatigue Patient left: in chair;with chair alarm set;with call bell/phone within reach;with family/visitor present         Time: 1020-1036 PT Time Calculation (min) (ACUTE ONLY): 16 min  Charges:  $Therapeutic Activity: 8-22 mins                    G Codes:       Chesley Noon, PTA 04/29/17, 12:11 PM

## 2017-04-29 NOTE — Progress Notes (Signed)
Tried to wean pt off oxygen, her O2 sat on room air went down to 91, pt stated she was feeling short of breath so I put her oxygen back on 1L and her O2 sat went back up to 96. Will continue to monitor.

## 2017-04-29 NOTE — Progress Notes (Signed)
[facility] NAME: Kimberly [name]    MR#:  [id number]  DATE OF BIRTH:  [birth year]  SUBJECTIVE:   Pt. Here Due to a fall and noted to have a left shoulder fracture and also noted to be severely hyponatremic.  Her pain in left upper extremity is improved. Sling in place.  Confused overnight. Daughter at bedside.  REVIEW OF SYSTEMS:    Review of Systems  Constitutional: Negative for chills and fever.  HENT: Negative for congestion and tinnitus.   Eyes: Negative for blurred vision and double vision.  Respiratory: Negative for cough, shortness of breath and wheezing.   Cardiovascular: Negative for chest pain, orthopnea and PND.  Gastrointestinal: Negative for abdominal pain, diarrhea, nausea and vomiting.  Genitourinary: Negative for dysuria and hematuria.  Musculoskeletal: Positive for falls and joint pain (Left Shoulder).  Neurological: Negative for dizziness, sensory change and focal weakness.  All other systems reviewed and are negative.   Nutrition: Heart Healthy Tolerating Diet: Yes Tolerating PT: Await Eval.      DRUG ALLERGIES:   Allergies  Allergen Reactions  . Ciprofloxacin Other (See Comments)    REACTION: burning of mouth and skin  . Lisinopril Cough  . Remeron [Mirtazapine] Other (See Comments)    constipation  . Sulfonamide Derivatives Other (See Comments)    REACTION: malaise  . Tetanus Toxoid Swelling  . Vesicare [Solifenacin Succinate] Other (See Comments)    Blurred vision and weakness  . Zoloft [Sertraline Hcl] Nausea And Vomiting  . Cephalexin Rash and Other (See Comments)    REACTION: rash and burning sensation to skin on arms and legs with itching.  Madelin Headings [Amitriptyline] Palpitations  . Penicillins Rash and Other (See Comments)    Has patient had a PCN reaction causing immediate rash, facial/tongue/throat swelling, SOB or lightheadedness with hypotension: Unknown Has patient had a PCN reaction  causing severe rash involving mucus membranes or skin necrosis: Unknown Has patient had a PCN reaction that required hospitalization: Unknown Has patient had a PCN reaction occurring within the last 10 years: No If all of the above answers are "NO", then may proceed with Cephalosporin use. Has since take Augmentin with no problem    VITALS:  Blood pressure (!) 138/46, pulse 72, temperature 98.9 F (37.2 C), temperature source Oral, resp. rate 18, height 5\' 3"  (1.6 m), weight 79.2 kg (174 lb 11.2 oz), last menstrual period 08/03/1978, SpO2 97 %.  PHYSICAL EXAMINATION:   Physical Exam  GENERAL:  83 y.o.-year-old patient lying in bed in no acute distress.  EYES: Pupils equal, round, reactive to light and accommodation. No scleral icterus. Extraocular muscles intact.  HEENT: Head atraumatic, normocephalic. Oropharynx and nasopharynx clear.  NECK:  Supple, no jugular venous distention. No thyroid enlargement, no tenderness.  LUNGS: Normal breath sounds bilaterally, no wheezing, rales, rhonchi. No use of accessory muscles of respiration.  CARDIOVASCULAR: S1, S2 normal. No murmurs, rubs, or gallops.  ABDOMEN: Soft, nontender, nondistended. Bowel sounds present. No organomegaly or mass.  EXTREMITIES: No cyanosis, clubbing or edema b/l.   Left shoulder is in a sling. NEUROLOGIC: Cranial nerves II through XII are intact. No focal Motor or sensory deficits b/l.   PSYCHIATRIC: The patient is alert and oriented x 3.  SKIN: No obvious rash, lesion, or ulcer.    LABORATORY PANEL:   CBC  Recent Labs Lab 04/28/17 0617  WBC 5.1  HGB 8.8*  HCT 25.3*  PLT 149*   ------------------------------------------------------------------------------------------------------------------  Chemistries  Recent Labs Lab 04/27/17 1612  04/28/17 0954  04/29/17 0152  NA 114*  < > 120*  < > 123*  K 3.9  < > 3.4*  --   --   CL 79*  < > 86*  --   --   CO2 24  < > 26  --   --   GLUCOSE 95  < > 125*  --    --   BUN 18  < > 16  --   --   CREATININE 1.11*  < > 0.97  --   --   CALCIUM 7.9*  < > 7.6*  --   --   AST 17  --   --   --   --   ALT 12*  --   --   --   --   ALKPHOS 84  --   --   --   --   BILITOT 0.4  --   --   --   --   < > = values in this interval not displayed. ------------------------------------------------------------------------------------------------------------------  Cardiac Enzymes  Recent Labs Lab 04/27/17 1612  TROPONINI 0.04*   ------------------------------------------------------------------------------------------------------------------  RADIOLOGY:  Ct Head Wo Contrast  Result Date: 04/27/2017 CLINICAL DATA:  Fall this morning, found down. Bruising along the back of the head. EXAM: CT HEAD WITHOUT CONTRAST CT CERVICAL SPINE WITHOUT CONTRAST TECHNIQUE: Multidetector CT imaging of the head and cervical spine was performed following the standard protocol without intravenous contrast. Multiplanar CT image reconstructions of the cervical spine were also generated. COMPARISON:  04/28/2016 FINDINGS: CT HEAD FINDINGS Brain: Stable calcifications in the lentiform nuclei bilaterally, likely physiologic. Otherwise, the brainstem, cerebellum, cerebral peduncles, thalami, basal ganglia, basilar cisterns, and ventricular system appear within normal limits. Periventricular white matter and corona radiata hypodensities favor chronic ischemic microvascular white matter disease. No intracranial hemorrhage, mass lesion, or acute CVA. Vascular: There is atherosclerotic calcification of the cavernous carotid arteries bilaterally. Skull: Unremarkable Sinuses/Orbits: Complete opacification of the left sphenoid sinus. Chronic ethmoid sinusitis. Other: No supplemental non-categorized findings. CT CERVICAL SPINE FINDINGS Alignment: Mild reversal the normal cervical lordosis. 1.5 mm degenerative anterolisthesis at C3-4. Skull base and vertebrae: Spurring and loss of articular space at the  anterior C1- 2 articulation. Degenerative loss of intervertebral disc height at C3-4 and C4-5. No acute cervical spine fracture. Soft tissues and spinal canal: Confluent nodularity in the somewhat and large left thyroid gland. Right thyroid lobe is absent. Rightward deviation of the trachea. Disc levels: Uncinate and facet spurring cause at least moderate right foraminal stenosis at C4-5, and borderline left foraminal stenosis at C3-4 and C4-5. Upper chest: Unremarkable Other: No supplemental non-categorized findings. IMPRESSION: 1. No acute intracranial findings are acute cervical spine findings. 2. Paranasal sinusitis with complete opacification of the left sphenoid sinus and chronic because all thickening in the ethmoid air cells. 3. Cervical spondylosis, causing right foraminal impingement at C4-5. 4. Prior right thyroid lobe resection. Nodular and enlarged left thyroid lobe. This has been previously worked up for example on prior thyroid ultrasound 08/04/2012. Electronically Signed   By: Van Clines M.D.   On: 04/27/2017 17:12   Ct Cervical Spine Wo Contrast  Result Date: 04/27/2017 CLINICAL DATA:  Fall this morning, found down. Bruising along the back of the head. EXAM: CT HEAD WITHOUT CONTRAST CT CERVICAL SPINE WITHOUT CONTRAST TECHNIQUE: Multidetector CT imaging of the head and cervical spine was performed following the standard protocol without intravenous contrast. Multiplanar CT image reconstructions of the cervical  spine were also generated. COMPARISON:  04/28/2016 FINDINGS: CT HEAD FINDINGS Brain: Stable calcifications in the lentiform nuclei bilaterally, likely physiologic. Otherwise, the brainstem, cerebellum, cerebral peduncles, thalami, basal ganglia, basilar cisterns, and ventricular system appear within normal limits. Periventricular white matter and corona radiata hypodensities favor chronic ischemic microvascular white matter disease. No intracranial hemorrhage, mass lesion, or acute  CVA. Vascular: There is atherosclerotic calcification of the cavernous carotid arteries bilaterally. Skull: Unremarkable Sinuses/Orbits: Complete opacification of the left sphenoid sinus. Chronic ethmoid sinusitis. Other: No supplemental non-categorized findings. CT CERVICAL SPINE FINDINGS Alignment: Mild reversal the normal cervical lordosis. 1.5 mm degenerative anterolisthesis at C3-4. Skull base and vertebrae: Spurring and loss of articular space at the anterior C1- 2 articulation. Degenerative loss of intervertebral disc height at C3-4 and C4-5. No acute cervical spine fracture. Soft tissues and spinal canal: Confluent nodularity in the somewhat and large left thyroid gland. Right thyroid lobe is absent. Rightward deviation of the trachea. Disc levels: Uncinate and facet spurring cause at least moderate right foraminal stenosis at C4-5, and borderline left foraminal stenosis at C3-4 and C4-5. Upper chest: Unremarkable Other: No supplemental non-categorized findings. IMPRESSION: 1. No acute intracranial findings are acute cervical spine findings. 2. Paranasal sinusitis with complete opacification of the left sphenoid sinus and chronic because all thickening in the ethmoid air cells. 3. Cervical spondylosis, causing right foraminal impingement at C4-5. 4. Prior right thyroid lobe resection. Nodular and enlarged left thyroid lobe. This has been previously worked up for example on prior thyroid ultrasound 08/04/2012. Electronically Signed   By: Van Clines M.D.   On: 04/27/2017 17:12   Ct Thoracic Spine Wo Contrast  Result Date: 04/27/2017 CLINICAL DATA:  Trip and fall at home. Possible multiple recent falls. EXAM: CT THORACIC AND LUMBAR SPINE WITHOUT CONTRAST TECHNIQUE: Multidetector CT imaging of the thoracic and lumbar spine was performed without contrast. Multiplanar CT image reconstructions were also generated. COMPARISON:  Lumbar spine radiographs November 29, 2016 and chest radiograph July 27 , 2018  FINDINGS: CT THORACIC SPINE FINDINGS ALIGNMENT: Maintained thoracic lordosis. No malalignment. VERTEBRAE: Moderate to severe T11 burst fracture with 50-75% height loss, 2 mm retropulsed bony fragments. Fracture was present July 2018, however fracture line present along superior endplate. The remaining thoracic vertebral bodies intact. Intervertebral disc heights generally preserved, multilevel intradiscal mineralization. Multilevel endplate sclerosis and spurring. Osteopenia. Scattered old Schmorl's nodes. Multilevel mild facet arthropathy. PARASPINAL AND OTHER SOFT TISSUES: Partially imaged thyromegaly displacing the trachea to the RIGHT. Calcific atherosclerosis aortic arch. Cardiomegaly. Small RIGHT pleural effusion. DISC LEVELS: Mild canal stenosis T10-11 due to retropulsed bony fragments. No osseous neural foraminal narrowing at any level. CT LUMBAR SPINE FINDINGS SEGMENTATION: For the purposes of this report the last well-formed intervertebral disc space is reported as L5-S1. ALIGNMENT: Maintained lumbar lordosis. No malalignment. VERTEBRAE: Vertebral bodies and posterior elements are intact. Intervertebral disc heights preserved, multilevel vacuum disc. No destructive bony lesions. Osteopenia. PARASPINAL AND OTHER SOFT TISSUES: Included prevertebral and paraspinal soft tissues are unremarkable. DISC LEVELS: L1-2: No disc bulge, canal stenosis nor neural foraminal narrowing. L2-3: Small LEFT extraforaminal disc protrusion without canal stenosis or neural foraminal narrowing. L3-4: Moderate LEFT extraforaminal disc protrusion without canal stenosis. Moderate LEFT neural foraminal narrowing. L4-5: Small broad-based disc bulge, mild facet arthropathy and ligamentum flavum redundancy without canal stenosis or neural foraminal narrowing. L5-S1: Small broad-based disc bulge, mild facet arthropathy and ligamentum flavum redundancy without canal stenosis or neural foraminal narrowing. IMPRESSION: CT THORACIC SPINE  IMPRESSION 1. Subacute T11 moderate  to severe burst fracture resulting in mild canal stenosis. No malalignment. 2. Osteopenia. 3. Thyromegaly displacing the trachea to the RIGHT, incompletely characterized. CT LUMBAR SPINE IMPRESSION 1. No fracture or malalignment. 2. Osteopenia. 3. L3-4 disc protrusion results in moderate LEFT L3-4 neural foraminal narrowing. No canal stenosis at any level. Aortic Atherosclerosis (ICD10-I70.0). Electronically Signed   By: Elon Alas M.D.   On: 04/27/2017 17:06   Ct Lumbar Spine Wo Contrast  Result Date: 04/27/2017 CLINICAL DATA:  Trip and fall at home. Possible multiple recent falls. EXAM: CT THORACIC AND LUMBAR SPINE WITHOUT CONTRAST TECHNIQUE: Multidetector CT imaging of the thoracic and lumbar spine was performed without contrast. Multiplanar CT image reconstructions were also generated. COMPARISON:  Lumbar spine radiographs November 29, 2016 and chest radiograph July 27 , 2018 FINDINGS: CT THORACIC SPINE FINDINGS ALIGNMENT: Maintained thoracic lordosis. No malalignment. VERTEBRAE: Moderate to severe T11 burst fracture with 50-75% height loss, 2 mm retropulsed bony fragments. Fracture was present July 2018, however fracture line present along superior endplate. The remaining thoracic vertebral bodies intact. Intervertebral disc heights generally preserved, multilevel intradiscal mineralization. Multilevel endplate sclerosis and spurring. Osteopenia. Scattered old Schmorl's nodes. Multilevel mild facet arthropathy. PARASPINAL AND OTHER SOFT TISSUES: Partially imaged thyromegaly displacing the trachea to the RIGHT. Calcific atherosclerosis aortic arch. Cardiomegaly. Small RIGHT pleural effusion. DISC LEVELS: Mild canal stenosis T10-11 due to retropulsed bony fragments. No osseous neural foraminal narrowing at any level. CT LUMBAR SPINE FINDINGS SEGMENTATION: For the purposes of this report the last well-formed intervertebral disc space is reported as L5-S1. ALIGNMENT:  Maintained lumbar lordosis. No malalignment. VERTEBRAE: Vertebral bodies and posterior elements are intact. Intervertebral disc heights preserved, multilevel vacuum disc. No destructive bony lesions. Osteopenia. PARASPINAL AND OTHER SOFT TISSUES: Included prevertebral and paraspinal soft tissues are unremarkable. DISC LEVELS: L1-2: No disc bulge, canal stenosis nor neural foraminal narrowing. L2-3: Small LEFT extraforaminal disc protrusion without canal stenosis or neural foraminal narrowing. L3-4: Moderate LEFT extraforaminal disc protrusion without canal stenosis. Moderate LEFT neural foraminal narrowing. L4-5: Small broad-based disc bulge, mild facet arthropathy and ligamentum flavum redundancy without canal stenosis or neural foraminal narrowing. L5-S1: Small broad-based disc bulge, mild facet arthropathy and ligamentum flavum redundancy without canal stenosis or neural foraminal narrowing. IMPRESSION: CT THORACIC SPINE IMPRESSION 1. Subacute T11 moderate to severe burst fracture resulting in mild canal stenosis. No malalignment. 2. Osteopenia. 3. Thyromegaly displacing the trachea to the RIGHT, incompletely characterized. CT LUMBAR SPINE IMPRESSION 1. No fracture or malalignment. 2. Osteopenia. 3. L3-4 disc protrusion results in moderate LEFT L3-4 neural foraminal narrowing. No canal stenosis at any level. Aortic Atherosclerosis (ICD10-I70.0). Electronically Signed   By: Elon Alas M.D.   On: 04/27/2017 17:06   Dg Chest Portable 1 View  Result Date: 04/27/2017 CLINICAL DATA:  Status post fall today.  Weakness. EXAM: PORTABLE CHEST 1 VIEW COMPARISON:  March 01, 2017 FINDINGS: The mediastinal contour is stable. The heart size is enlarged. Mild diffuse increased pulmonary interstitium is identified bilaterally. Chronic elevation of right hemidiaphragm is noted. There is no focal pneumonia or pleural effusion. The bony structures are stable. IMPRESSION: Mild increased pulmonary interstitium bilaterally  which can be seen in mild interstitial edema. Cardiomegaly. Electronically Signed   By: Abelardo Diesel M.D.   On: 04/27/2017 17:10   Dg Shoulder Left  Result Date: 04/27/2017 CLINICAL DATA:  Status post fall today, pain of left shoulder. EXAM: LEFT SHOULDER - 2+ VIEW COMPARISON:  None. FINDINGS: There is mild displaced fracture of the proximal left humerus  at the humeral neck. The visualized ribs and left lung are normal. IMPRESSION: Displaced fracture of proximal left humerus. Electronically Signed   By: Abelardo Diesel M.D.   On: 04/27/2017 17:11   Dg Humerus Left  Result Date: 04/27/2017 CLINICAL DATA:  Status post fall today with left shoulder pain. EXAM: LEFT HUMERUS - 2+ VIEW COMPARISON:  None. FINDINGS: There is displaced fracture of the proximal left humerus. There is no dislocation. IMPRESSION: Fracture proximal left humerus. Electronically Signed   By: Abelardo Diesel M.D.   On: 04/27/2017 17:12     ASSESSMENT AND PLAN:   83 year old female with past medical history of hypertension, hypothyroidism, hyperlipidemia, GERD, history of chronic systolic CHF who presented to the hospital after a fall and noted to have a left humeral fracture and also noted to be severely hyponatremic.  * Hypovolemic hyponatremia due to acute on chronic systolic congestive heart failure  Improved with Lasix. But due to severely low sodium started on Tolvaptan. Sodium improving but still low. - Discussed with Dr. Juleen China of nephrology Continue Tolvaptan one more day. Monitor input and output along with daily weight.  * Left humeral fracture-seen by orthopedics and no acute surgical intervention.  Sling in place. Needs outpatient follow-up.  * T11 burst fracture-patient has a history of chronic back issues and pain. Neurosurgery was consulted and appreciate input. Continue supportive care for now and there is no need for acute surgical intervention presently.  * Essential HTN - continue carvedilol,  losartan.  * Hypothyroidism-continue Synthroid.  * Mild cognitive impairment. Monitor for inpatient delirium.  All the records are reviewed and case discussed with Care Management/Social Worker. Management plans discussed with the patient, family and they are in agreement.  CODE STATUS:  DO NOT RESUSCITATE  DVT Prophylaxis: Lovenox  TOTAL TIME TAKING CARE OF THIS PATIENT:  30 minutes.   POSSIBLE D/C IN  1-2 DAYS, DEPENDING ON CLINICAL CONDITION.  Skilled nursing facility at discharge.  Hillary Bow R M.D on 04/29/2017 at 10:07 AM  Between 7am to 6pm - Pager - [phone]  After 6pm go to [email or web address] - [employer] [facility]  Office  [phone]  CC: Primary care physician; Tower, Wynelle Fanny, MD

## 2017-04-29 NOTE — Progress Notes (Signed)
[facility]  ROUNDING NOTE   Subjective:   Daughter at bedside.   No surgical intervention for humeral fracture  Na 123 (120)  Tolvaptan given 9/26  Objective:  Vital signs in last 24 hours:  Temp:  [98.2 F (36.8 C)-99 F (37.2 C)] 98.9 F (37.2 C) (09/27 0755) Pulse Rate:  [63-78] 72 (09/27 0755) Resp:  [18] 18 (09/27 0350) BP: (111-138)/(46-97) 138/46 (09/27 0755) SpO2:  [92 %-99 %] 97 % (09/27 0755) Weight:  [79.2 kg (174 lb 11.2 oz)] 79.2 kg (174 lb 11.2 oz) (09/27 0350)  Weight change: -2.404 kg (-5 lb 4.8 oz) Filed Weights   04/27/17 1523 04/28/17 0500 04/29/17 0350  Weight: 81.6 kg (180 lb) 83.7 kg (184 lb 9.6 oz) 79.2 kg (174 lb 11.2 oz)    Intake/Output: I/O last 3 completed shifts: In: 1068.3 [P.O.:960; I.V.:108.3] Out: 1900 [Urine:1900]   Intake/Output this shift:  Total I/O In: -  Out: 400 [Urine:400]  Physical Exam: General: NAD, laying in bed  Head: Normocephalic, atraumatic. Moist oral mucosal membranes  Eyes: Anicteric, PERRL  Neck: Supple, trachea midline  Lungs:  Clear to auscultation  Heart: Regular rate and rhythm  Abdomen:  Soft, nontender,   Extremities: no peripheral edema.  Neurologic: Nonfocal, moving all four extremities  Skin: No lesions       Basic Metabolic Panel:  Recent Labs Lab 04/27/17 1612  04/27/17 2306 04/28/17 0617 04/28/17 0954 04/28/17 1753 04/29/17 0152  NA 114*  < > 118* 118* 120* 121* 123*  K 3.9  --   --  3.4* 3.4*  --   --   CL 79*  --   --  85* 86*  --   --   CO2 24  --   --  25 26  --   --   GLUCOSE 95  --   --  80 125*  --   --   BUN 18  --   --  17 16  --   --   CREATININE 1.11*  --   --  1.15* 0.97  --   --   CALCIUM 7.9*  --   --  7.6* 7.6*  --   --   < > = values in this interval not displayed.  Liver Function Tests:  Recent Labs Lab 04/27/17 1612  AST 17  ALT 12*  ALKPHOS 84  BILITOT 0.4  PROT 5.9*  ALBUMIN 3.3*   No results for input(s): LIPASE, AMYLASE in the  last 168 hours. No results for input(s): AMMONIA in the last 168 hours.  CBC:  Recent Labs Lab 04/27/17 1612 04/28/17 0617  WBC 7.0 5.1  NEUTROABS 5.5  --   HGB 9.5* 8.8*  HCT 27.0* 25.3*  MCV 84.9 83.7  PLT 160 149*    Cardiac Enzymes:  Recent Labs Lab 04/27/17 1612  TROPONINI 0.04*    BNP: Invalid input(s): POCBNP  CBG: No results for input(s): GLUCAP in the last 168 hours.  Microbiology: Results for orders placed or performed in visit on 12/07/16  Urine culture     Status: None   Collection Time: 12/07/16  3:56 PM  Result Value Ref Range Status   Culture KLEBSIELLA PNEUMONIAE  Final   Colony Count Greater than 100,000 CFU/mL  Final   Organism ID, Bacteria KLEBSIELLA PNEUMONIAE  Final      Susceptibility   Klebsiella pneumoniae -  (no method available)    AMPICILLIN >=32 Resistant     AMOX/CLAVULANIC <=2 Sensitive  AMPICILLIN/SULBACTAM 8 Sensitive     PIP/TAZO <=4 Sensitive     IMIPENEM <=0.25 Sensitive     CEFAZOLIN <=4 Not Reportable     CEFTRIAXONE <=1 Sensitive     CEFTAZIDIME <=1 Sensitive     CEFEPIME <=1 Sensitive     GENTAMICIN <=1 Sensitive     TOBRAMYCIN <=1 Sensitive     CIPROFLOXACIN <=0.25 Sensitive     LEVOFLOXACIN <=0.12 Sensitive     NITROFURANTOIN 64 Intermediate     TRIMETH/SULFA* <=20 Sensitive      * NR=NOT REPORTABLE,SEE COMMENTORAL therapy:A cefazolin MIC of <32 predicts susceptibility to the oral agents cefaclor,cefdinir,cefpodoxime,cefprozil,cefuroxime,cephalexin,and loracarbef when used for therapy of uncomplicated UTIs due to E.coli,K.pneumomiae,and P.mirabilis. PARENTERAL therapy: A cefazolinMIC of >8 indicates resistance to parenteralcefazolin. An alternate test method must beperformed to confirm susceptibility to parenteralcefazolin.    Coagulation Studies: No results for input(s): LABPROT, INR in the last 72 hours.  Urinalysis:  Recent Labs  04/27/17 1612  COLORURINE YELLOW*  LABSPEC 1.008  PHURINE 5.0  GLUCOSEU  NEGATIVE  HGBUR NEGATIVE  BILIRUBINUR NEGATIVE  KETONESUR NEGATIVE  PROTEINUR 30*  NITRITE NEGATIVE  LEUKOCYTESUR NEGATIVE      Imaging: Ct Head Wo Contrast  Result Date: 04/27/2017 CLINICAL DATA:  Fall this morning, found down. Bruising along the back of the head. EXAM: CT HEAD WITHOUT CONTRAST CT CERVICAL SPINE WITHOUT CONTRAST TECHNIQUE: Multidetector CT imaging of the head and cervical spine was performed following the standard protocol without intravenous contrast. Multiplanar CT image reconstructions of the cervical spine were also generated. COMPARISON:  04/28/2016 FINDINGS: CT HEAD FINDINGS Brain: Stable calcifications in the lentiform nuclei bilaterally, likely physiologic. Otherwise, the brainstem, cerebellum, cerebral peduncles, thalami, basal ganglia, basilar cisterns, and ventricular system appear within normal limits. Periventricular white matter and corona radiata hypodensities favor chronic ischemic microvascular white matter disease. No intracranial hemorrhage, mass lesion, or acute CVA. Vascular: There is atherosclerotic calcification of the cavernous carotid arteries bilaterally. Skull: Unremarkable Sinuses/Orbits: Complete opacification of the left sphenoid sinus. Chronic ethmoid sinusitis. Other: No supplemental non-categorized findings. CT CERVICAL SPINE FINDINGS Alignment: Mild reversal the normal cervical lordosis. 1.5 mm degenerative anterolisthesis at C3-4. Skull base and vertebrae: Spurring and loss of articular space at the anterior C1- 2 articulation. Degenerative loss of intervertebral disc height at C3-4 and C4-5. No acute cervical spine fracture. Soft tissues and spinal canal: Confluent nodularity in the somewhat and large left thyroid gland. Right thyroid lobe is absent. Rightward deviation of the trachea. Disc levels: Uncinate and facet spurring cause at least moderate right foraminal stenosis at C4-5, and borderline left foraminal stenosis at C3-4 and C4-5. Upper  chest: Unremarkable Other: No supplemental non-categorized findings. IMPRESSION: 1. No acute intracranial findings are acute cervical spine findings. 2. Paranasal sinusitis with complete opacification of the left sphenoid sinus and chronic because all thickening in the ethmoid air cells. 3. Cervical spondylosis, causing right foraminal impingement at C4-5. 4. Prior right thyroid lobe resection. Nodular and enlarged left thyroid lobe. This has been previously worked up for example on prior thyroid ultrasound 08/04/2012. Electronically Signed   By: Van Clines M.D.   On: 04/27/2017 17:12   Ct Cervical Spine Wo Contrast  Result Date: 04/27/2017 CLINICAL DATA:  Fall this morning, found down. Bruising along the back of the head. EXAM: CT HEAD WITHOUT CONTRAST CT CERVICAL SPINE WITHOUT CONTRAST TECHNIQUE: Multidetector CT imaging of the head and cervical spine was performed following the standard protocol without intravenous contrast. Multiplanar CT image reconstructions of the cervical  spine were also generated. COMPARISON:  04/28/2016 FINDINGS: CT HEAD FINDINGS Brain: Stable calcifications in the lentiform nuclei bilaterally, likely physiologic. Otherwise, the brainstem, cerebellum, cerebral peduncles, thalami, basal ganglia, basilar cisterns, and ventricular system appear within normal limits. Periventricular white matter and corona radiata hypodensities favor chronic ischemic microvascular white matter disease. No intracranial hemorrhage, mass lesion, or acute CVA. Vascular: There is atherosclerotic calcification of the cavernous carotid arteries bilaterally. Skull: Unremarkable Sinuses/Orbits: Complete opacification of the left sphenoid sinus. Chronic ethmoid sinusitis. Other: No supplemental non-categorized findings. CT CERVICAL SPINE FINDINGS Alignment: Mild reversal the normal cervical lordosis. 1.5 mm degenerative anterolisthesis at C3-4. Skull base and vertebrae: Spurring and loss of articular space  at the anterior C1- 2 articulation. Degenerative loss of intervertebral disc height at C3-4 and C4-5. No acute cervical spine fracture. Soft tissues and spinal canal: Confluent nodularity in the somewhat and large left thyroid gland. Right thyroid lobe is absent. Rightward deviation of the trachea. Disc levels: Uncinate and facet spurring cause at least moderate right foraminal stenosis at C4-5, and borderline left foraminal stenosis at C3-4 and C4-5. Upper chest: Unremarkable Other: No supplemental non-categorized findings. IMPRESSION: 1. No acute intracranial findings are acute cervical spine findings. 2. Paranasal sinusitis with complete opacification of the left sphenoid sinus and chronic because all thickening in the ethmoid air cells. 3. Cervical spondylosis, causing right foraminal impingement at C4-5. 4. Prior right thyroid lobe resection. Nodular and enlarged left thyroid lobe. This has been previously worked up for example on prior thyroid ultrasound 08/04/2012. Electronically Signed   By: Van Clines M.D.   On: 04/27/2017 17:12   Ct Thoracic Spine Wo Contrast  Result Date: 04/27/2017 CLINICAL DATA:  Trip and fall at home. Possible multiple recent falls. EXAM: CT THORACIC AND LUMBAR SPINE WITHOUT CONTRAST TECHNIQUE: Multidetector CT imaging of the thoracic and lumbar spine was performed without contrast. Multiplanar CT image reconstructions were also generated. COMPARISON:  Lumbar spine radiographs November 29, 2016 and chest radiograph July 27 , 2018 FINDINGS: CT THORACIC SPINE FINDINGS ALIGNMENT: Maintained thoracic lordosis. No malalignment. VERTEBRAE: Moderate to severe T11 burst fracture with 50-75% height loss, 2 mm retropulsed bony fragments. Fracture was present July 2018, however fracture line present along superior endplate. The remaining thoracic vertebral bodies intact. Intervertebral disc heights generally preserved, multilevel intradiscal mineralization. Multilevel endplate sclerosis  and spurring. Osteopenia. Scattered old Schmorl's nodes. Multilevel mild facet arthropathy. PARASPINAL AND OTHER SOFT TISSUES: Partially imaged thyromegaly displacing the trachea to the RIGHT. Calcific atherosclerosis aortic arch. Cardiomegaly. Small RIGHT pleural effusion. DISC LEVELS: Mild canal stenosis T10-11 due to retropulsed bony fragments. No osseous neural foraminal narrowing at any level. CT LUMBAR SPINE FINDINGS SEGMENTATION: For the purposes of this report the last well-formed intervertebral disc space is reported as L5-S1. ALIGNMENT: Maintained lumbar lordosis. No malalignment. VERTEBRAE: Vertebral bodies and posterior elements are intact. Intervertebral disc heights preserved, multilevel vacuum disc. No destructive bony lesions. Osteopenia. PARASPINAL AND OTHER SOFT TISSUES: Included prevertebral and paraspinal soft tissues are unremarkable. DISC LEVELS: L1-2: No disc bulge, canal stenosis nor neural foraminal narrowing. L2-3: Small LEFT extraforaminal disc protrusion without canal stenosis or neural foraminal narrowing. L3-4: Moderate LEFT extraforaminal disc protrusion without canal stenosis. Moderate LEFT neural foraminal narrowing. L4-5: Small broad-based disc bulge, mild facet arthropathy and ligamentum flavum redundancy without canal stenosis or neural foraminal narrowing. L5-S1: Small broad-based disc bulge, mild facet arthropathy and ligamentum flavum redundancy without canal stenosis or neural foraminal narrowing. IMPRESSION: CT THORACIC SPINE IMPRESSION 1. Subacute T11 moderate to  severe burst fracture resulting in mild canal stenosis. No malalignment. 2. Osteopenia. 3. Thyromegaly displacing the trachea to the RIGHT, incompletely characterized. CT LUMBAR SPINE IMPRESSION 1. No fracture or malalignment. 2. Osteopenia. 3. L3-4 disc protrusion results in moderate LEFT L3-4 neural foraminal narrowing. No canal stenosis at any level. Aortic Atherosclerosis (ICD10-I70.0). Electronically Signed    By: Elon Alas M.D.   On: 04/27/2017 17:06   Ct Lumbar Spine Wo Contrast  Result Date: 04/27/2017 CLINICAL DATA:  Trip and fall at home. Possible multiple recent falls. EXAM: CT THORACIC AND LUMBAR SPINE WITHOUT CONTRAST TECHNIQUE: Multidetector CT imaging of the thoracic and lumbar spine was performed without contrast. Multiplanar CT image reconstructions were also generated. COMPARISON:  Lumbar spine radiographs November 29, 2016 and chest radiograph July 27 , 2018 FINDINGS: CT THORACIC SPINE FINDINGS ALIGNMENT: Maintained thoracic lordosis. No malalignment. VERTEBRAE: Moderate to severe T11 burst fracture with 50-75% height loss, 2 mm retropulsed bony fragments. Fracture was present July 2018, however fracture line present along superior endplate. The remaining thoracic vertebral bodies intact. Intervertebral disc heights generally preserved, multilevel intradiscal mineralization. Multilevel endplate sclerosis and spurring. Osteopenia. Scattered old Schmorl's nodes. Multilevel mild facet arthropathy. PARASPINAL AND OTHER SOFT TISSUES: Partially imaged thyromegaly displacing the trachea to the RIGHT. Calcific atherosclerosis aortic arch. Cardiomegaly. Small RIGHT pleural effusion. DISC LEVELS: Mild canal stenosis T10-11 due to retropulsed bony fragments. No osseous neural foraminal narrowing at any level. CT LUMBAR SPINE FINDINGS SEGMENTATION: For the purposes of this report the last well-formed intervertebral disc space is reported as L5-S1. ALIGNMENT: Maintained lumbar lordosis. No malalignment. VERTEBRAE: Vertebral bodies and posterior elements are intact. Intervertebral disc heights preserved, multilevel vacuum disc. No destructive bony lesions. Osteopenia. PARASPINAL AND OTHER SOFT TISSUES: Included prevertebral and paraspinal soft tissues are unremarkable. DISC LEVELS: L1-2: No disc bulge, canal stenosis nor neural foraminal narrowing. L2-3: Small LEFT extraforaminal disc protrusion without canal  stenosis or neural foraminal narrowing. L3-4: Moderate LEFT extraforaminal disc protrusion without canal stenosis. Moderate LEFT neural foraminal narrowing. L4-5: Small broad-based disc bulge, mild facet arthropathy and ligamentum flavum redundancy without canal stenosis or neural foraminal narrowing. L5-S1: Small broad-based disc bulge, mild facet arthropathy and ligamentum flavum redundancy without canal stenosis or neural foraminal narrowing. IMPRESSION: CT THORACIC SPINE IMPRESSION 1. Subacute T11 moderate to severe burst fracture resulting in mild canal stenosis. No malalignment. 2. Osteopenia. 3. Thyromegaly displacing the trachea to the RIGHT, incompletely characterized. CT LUMBAR SPINE IMPRESSION 1. No fracture or malalignment. 2. Osteopenia. 3. L3-4 disc protrusion results in moderate LEFT L3-4 neural foraminal narrowing. No canal stenosis at any level. Aortic Atherosclerosis (ICD10-I70.0). Electronically Signed   By: Elon Alas M.D.   On: 04/27/2017 17:06   Dg Chest Portable 1 View  Result Date: 04/27/2017 CLINICAL DATA:  Status post fall today.  Weakness. EXAM: PORTABLE CHEST 1 VIEW COMPARISON:  March 01, 2017 FINDINGS: The mediastinal contour is stable. The heart size is enlarged. Mild diffuse increased pulmonary interstitium is identified bilaterally. Chronic elevation of right hemidiaphragm is noted. There is no focal pneumonia or pleural effusion. The bony structures are stable. IMPRESSION: Mild increased pulmonary interstitium bilaterally which can be seen in mild interstitial edema. Cardiomegaly. Electronically Signed   By: Abelardo Diesel M.D.   On: 04/27/2017 17:10   Dg Shoulder Left  Result Date: 04/27/2017 CLINICAL DATA:  Status post fall today, pain of left shoulder. EXAM: LEFT SHOULDER - 2+ VIEW COMPARISON:  None. FINDINGS: There is mild displaced fracture of the proximal left humerus at  the humeral neck. The visualized ribs and left lung are normal. IMPRESSION: Displaced fracture  of proximal left humerus. Electronically Signed   By: Abelardo Diesel M.D.   On: 04/27/2017 17:11   Dg Humerus Left  Result Date: 04/27/2017 CLINICAL DATA:  Status post fall today with left shoulder pain. EXAM: LEFT HUMERUS - 2+ VIEW COMPARISON:  None. FINDINGS: There is displaced fracture of the proximal left humerus. There is no dislocation. IMPRESSION: Fracture proximal left humerus. Electronically Signed   By: Abelardo Diesel M.D.   On: 04/27/2017 17:12     Medications:    . aspirin EC  81 mg Oral Daily  . carvedilol  3.125 mg Oral BID WC  . docusate sodium  100 mg Oral BID  . enoxaparin (LOVENOX) injection  40 mg Subcutaneous Q24H  . levothyroxine  25 mcg Oral QAC breakfast  . losartan  25 mg Oral Daily  . pantoprazole  40 mg Oral Daily  . sodium chloride flush  3 mL Intravenous Q12H  . tolvaptan  15 mg Oral Daily   acetaminophen **OR** acetaminophen, albuterol, fluticasone, guaiFENesin, ondansetron **OR** ondansetron (ZOFRAN) IV, oxyCODONE, polyethylene glycol, zolpidem  Assessment/ Plan:  Kimberly [name] is a 83 y.o. white female with congestive heart failure systolic, hypertension, anemia, hypothyroidism, GERD, hyperlipidemia, GERD, coronary artery disease, who was admitted to [facility] on 04/27/2017   1. Hyponatremia: acute on chronic. Hypervolemic. Hypo-osmolar.  Status post hypertonic saline on admission 9/25 Secondary to congestive heart failure.  - fluid restriction - tolvaptan 15mg  PO - first dose given on 9/26  2. Hypertension and systolic congestive heart failure: acute exacerbation - fluid restriction  3. Anemia: hemoglobin 8.8. Normocytic.    LOS: 2 Kahari Critzer 9/27/20189:59 AM

## 2017-04-30 ENCOUNTER — Inpatient Hospital Stay: Payer: Medicare Other

## 2017-04-30 LAB — BASIC METABOLIC PANEL
Anion gap: 6 (ref 5–15)
BUN: 16 mg/dL (ref 6–20)
CALCIUM: 8 mg/dL — AB (ref 8.9–10.3)
CO2: 28 mmol/L (ref 22–32)
CREATININE: 1.05 mg/dL — AB (ref 0.44–1.00)
Chloride: 92 mmol/L — ABNORMAL LOW (ref 101–111)
GFR calc non Af Amer: 46 mL/min — ABNORMAL LOW (ref >60)
GFR, EST AFRICAN AMERICAN: 54 mL/min — AB (ref >60)
Glucose, Bld: 110 mg/dL — ABNORMAL HIGH (ref 65–99)
Potassium: 4.5 mmol/L (ref 3.5–5.1)
SODIUM: 126 mmol/L — AB (ref 135–145)

## 2017-04-30 MED ORDER — FUROSEMIDE 10 MG/ML IJ SOLN
40.0000 mg | Freq: Two times a day (BID) | INTRAMUSCULAR | Status: AC
  Administered 2017-04-30 (×2): 40 mg via INTRAVENOUS
  Filled 2017-04-30 (×2): qty 4

## 2017-04-30 MED ORDER — FUROSEMIDE 10 MG/ML IJ SOLN
40.0000 mg | Freq: Once | INTRAMUSCULAR | Status: AC
  Administered 2017-04-30: 40 mg via INTRAVENOUS
  Filled 2017-04-30: qty 4

## 2017-04-30 NOTE — Progress Notes (Signed)
[facility] NAME: Kimberly [name]    MR#:  [id number]  DATE OF BIRTH:  January 04, 1928  SUBJECTIVE:   Pt. Here Due to a fall and noted to have a left shoulder fracture and also noted to be severely hyponatremic.  Her pain in left upper extremity is improved. Sling in place.  Daughter at bedside.  Still has SOB  REVIEW OF SYSTEMS:    Review of Systems  Constitutional: Negative for chills and fever.  HENT: Negative for congestion and tinnitus.   Eyes: Negative for blurred vision and double vision.  Respiratory: Negative for cough, shortness of breath and wheezing.   Cardiovascular: Negative for chest pain, orthopnea and PND.  Gastrointestinal: Negative for abdominal pain, diarrhea, nausea and vomiting.  Genitourinary: Negative for dysuria and hematuria.  Musculoskeletal: Positive for falls and joint pain (Left Shoulder).  Neurological: Negative for dizziness, sensory change and focal weakness.  All other systems reviewed and are negative.   Nutrition: Heart Healthy Tolerating Diet: Yes Tolerating PT: Await Eval.      DRUG ALLERGIES:   Allergies  Allergen Reactions  . Ciprofloxacin Other (See Comments)    REACTION: burning of mouth and skin  . Lisinopril Cough  . Remeron [Mirtazapine] Other (See Comments)    constipation  . Sulfonamide Derivatives Other (See Comments)    REACTION: malaise  . Tetanus Toxoid Swelling  . Vesicare [Solifenacin Succinate] Other (See Comments)    Blurred vision and weakness  . Zoloft [Sertraline Hcl] Nausea And Vomiting  . Cephalexin Rash and Other (See Comments)    REACTION: rash and burning sensation to skin on arms and legs with itching.  Madelin Headings [Amitriptyline] Palpitations  . Penicillins Rash and Other (See Comments)    Has patient had a PCN reaction causing immediate rash, facial/tongue/throat swelling, SOB or lightheadedness with hypotension: Unknown Has patient had a PCN reaction causing  severe rash involving mucus membranes or skin necrosis: Unknown Has patient had a PCN reaction that required hospitalization: Unknown Has patient had a PCN reaction occurring within the last 10 years: No If all of the above answers are "NO", then may proceed with Cephalosporin use. Has since take Augmentin with no problem    VITALS:  Blood pressure (!) 116/50, pulse 68, temperature 98.9 F (37.2 C), temperature source Oral, resp. rate 20, height 5\' 3"  (1.6 m), weight 78.4 kg (172 lb 12.8 oz), last menstrual period 08/03/1978, SpO2 97 %.  PHYSICAL EXAMINATION:   Physical Exam  GENERAL:  83 y.o.-year-old patient lying in bed in no acute distress.  EYES: Pupils equal, round, reactive to light and accommodation. No scleral icterus. Extraocular muscles intact.  HEENT: Head atraumatic, normocephalic. Oropharynx and nasopharynx clear.  NECK:  Supple, no jugular venous distention. No thyroid enlargement, no tenderness.  LUNGS: Bilateral crackles CARDIOVASCULAR: S1, S2 normal. No murmurs, rubs, or gallops.  ABDOMEN: Soft, nontender, nondistended. Bowel sounds present. No organomegaly or mass.  EXTREMITIES: No cyanosis, clubbing or edema b/l.   Left shoulder is in a sling. NEUROLOGIC: Cranial nerves II through XII are intact. No focal Motor or sensory deficits b/l.   PSYCHIATRIC: The patient is alert and oriented x 3.  SKIN: No obvious rash, lesion, or ulcer.   LABORATORY PANEL:   CBC  Recent Labs Lab 04/28/17 0617  WBC 5.1  HGB 8.8*  HCT 25.3*  PLT 149*   ------------------------------------------------------------------------------------------------------------------  Chemistries   Recent Labs Lab 04/27/17 1612  04/30/17 0347  NA 114*  < >  126*  K 3.9  < > 4.5  CL 79*  < > 92*  CO2 24  < > 28  GLUCOSE 95  < > 110*  BUN 18  < > 16  CREATININE 1.11*  < > 1.05*  CALCIUM 7.9*  < > 8.0*  AST 17  --   --   ALT 12*  --   --   ALKPHOS 84  --   --   BILITOT 0.4  --   --   <  > = values in this interval not displayed. ------------------------------------------------------------------------------------------------------------------  Cardiac Enzymes  Recent Labs Lab 04/27/17 1612  TROPONINI 0.04*   ------------------------------------------------------------------------------------------------------------------  RADIOLOGY:  No results found.   ASSESSMENT AND PLAN:   83 year old female with past medical history of hypertension, hypothyroidism, hyperlipidemia, GERD, history of chronic systolic CHF who presented to the hospital after a fall and noted to have a left humeral fracture and also noted to be severely hyponatremic.  * Hypovolemic hyponatremia due to acute on chronic systolic congestive heart failure  Improved with Lasix. But due to severely low sodium started on Tolvaptan. Change to lasix today Sodium improving but still low. - Discussed with Dr. Juleen China of nephrology Monitor input and output along with daily weight. Still has crackles on exam and needs diuresis  * Left humeral fracture-seen by orthopedics and no acute surgical intervention.  Sling in place. Needs outpatient follow-up.  * T11 burst fracture-patient has a history of chronic back issues and pain. Neurosurgery was consulted and appreciate input. Continue supportive care for now and there is no need for acute surgical intervention presently.  * Essential HTN - continue carvedilol, losartan.  * Hypothyroidism-continue Synthroid.  * Mild cognitive impairment. Monitor for inpatient delirium.  All the records are reviewed and case discussed with Care Management/Social Worker. Management plans discussed with the patient, family and they are in agreement.  CODE STATUS:  DO NOT RESUSCITATE  DVT Prophylaxis: Lovenox  TOTAL TIME TAKING CARE OF THIS PATIENT:  30 minutes.   POSSIBLE D/C IN  1-2 DAYS, DEPENDING ON CLINICAL CONDITION.  Skilled nursing facility at  discharge.  Kimberly [name] R M.D on 04/30/2017 at 12:18 PM  Between 7am to 6pm - Pager - [phone]  After 6pm go to [email or web address] - [employer] [facility]  Office  [phone]  CC: Primary care physician; Tower, Wynelle Fanny, MD

## 2017-04-30 NOTE — Care Management Important Message (Signed)
Important Message  Patient Details  Name: Kimberly [name] MRN: [id number] Date of Birth: [birth year]   Medicare Important Message Given:  Yes Signed IM notice given    Katrina Stack, RN 04/30/2017, 8:24 AM

## 2017-04-30 NOTE — Clinical Social Work Note (Signed)
CSW presented bed offers to patient's daughter and she chose [facility].  CSW contacted [facility] and they can accept patient over the weekend if she is medically ready for discharge and orders have been received.  CSW to continue to follow patient's progress throughout discharge planning.  Jones Broom. [place], MSW, [place]  04/30/2017 4:31 PM

## 2017-04-30 NOTE — Clinical Social Work Note (Signed)
Clinical Social Work Assessment  Patient Details  Name: Kimberly [name] MRN: [id number] Date of Birth: 07/21/1928  Date of referral:  04/29/17               Reason for consult:  Facility Placement                Permission sought to share information with:  Family Supports Permission granted to share information::  Yes, Verbal Permission Granted  Name::     [name] Daughter [phone]   Agency::  SNF admissions  Relationship::     Contact Information:     Housing/Transportation Living arrangements for the past 2 months:  Single Family Home Source of Information:  Adult Children Patient Interpreter Needed:  None Criminal Activity/Legal Involvement Pertinent to Current Situation/Hospitalization:  No - Comment as needed Significant Relationships:  Adult Children Lives with:  Self Do you feel safe going back to the place where you live?  No Need for family participation in patient care:  Yes (Comment)  Care giving concerns:  Patient's family feels she needs some short term rehab before going back home.  Social Worker assessment / plan: Patient is an 83 year old female who is alert and oriented x2.  Patient's family completed assessment with CSW due to patient having some confusion.  CSW explained role and process for looking for short term rehab.  CSW explained how insurance will pay for stay at SNF and what to expect at SNF.  Patient's family reports that she has never been to rehab before.  CSW explained that if patient and family want to pursue different placement options afterwards SNF social worker can assist with this.  Patient's family are requesting [facility] and gave CSW permission to begin bed search in [place] and [place].  Patient's family did not have any other questions or concerns.  Employment status:  Retired Forensic scientist:  Medicare PT Recommendations:  [place] / Referral to community resources:  [place]  Patient/Family's Response to care:  Patient's family is in agreement to going to SNF for short term rehab. Patient/Family's Understanding of and Emotional Response to Diagnosis, Current Treatment, and Prognosis:  Patient's family is aware of current treatment plan and prognosis.  They are hopeful patient will not have to be at [facility] for very long.  Emotional Assessment Appearance:  Appears stated age Attitude/Demeanor/Rapport:    Affect (typically observed):  Appropriate, Calm, Pleasant Orientation:  Oriented to Self, Oriented to Place, Oriented to Situation, Oriented to  Time Alcohol / Substance use:  Not Applicable Psych involvement (Current and /or in the community):  No (Comment)  Discharge Needs  Concerns to be addressed:  Lack of Support Readmission within the last 30 days:  No Current discharge risk:  Lack of support system Barriers to Discharge:  Continued Medical Work up   Ross Ludwig, [place] 04/30/2017, 4:02 PM

## 2017-04-30 NOTE — Progress Notes (Signed)
Physical Therapy Treatment Patient Details Name: Kimberly [name] MRN: [id number] DOB: Oct 05, 1927 Today's Date: 04/30/2017    History of Present Illness Pt is a 83 y.o.femalewith a known history of chronic systolic CHF with ejection fraction of 35%, hypertension, anemia of chronic disease presents to the emergency room after patient had episode of syncope and was found fallen on the floor by family.  She had pain in left shoulder area. Here in the emergency room patient has been found to have left humerus fracture, sodium severely low at 113. Orthopedics consulted and suggested non-surgical intervention with a sling placed.  Per neuro pt also has a h/o T11 burst fracture with back pain that has worsened minimally since recent fall.    PT Comments    Pt presented in bed agreeable to therapy. Pt indicated pain in L shoulder with movement. Performed supine to sit with use of features, additional time and cues for sequencing with overall minA. Performed sit to stand from EOB with minA requiring mod cues for hand placement and safety. Once in standing pt able to ambulate to recliner requiring mod cues for sequencing and safety with RW. Pt required assist for repositioning once in sitting for L arm comfort. Pt able to tolerance gentle seated therex as noted below with pt occasionally nodding off. Pt remained in recliner at end of session with call bell within reach and needs met.    Follow Up Recommendations  SNF     Equipment Recommendations       Recommendations for Other Services       Precautions / Restrictions Precautions Precautions: Fall Required Braces or Orthoses: Sling;Other Brace/Splint Restrictions Weight Bearing Restrictions: No LUE Weight Bearing: Partial weight bearing    Mobility  Bed Mobility Overal bed mobility: Needs Assistance Bed Mobility: Supine to Sit              Transfers Overall transfer level: Needs assistance Equipment used: Hemi-walker Transfers: Sit  to/from Stand Sit to Stand: Min assist            Ambulation/Gait   Ambulation Distance (Feet): 3 Feet Assistive device: Hemi-walker Gait Pattern/deviations: Step-through pattern Gait velocity: decreased   General Gait Details: forward flexed posture and cues for posture placement of HW   Stairs            Wheelchair Mobility    Modified Rankin (Stroke Patients Only)       Balance Overall balance assessment: Needs assistance Sitting-balance support: Feet supported Sitting balance-Leahy Scale: Fair     Standing balance support: Single extremity supported Standing balance-Leahy Scale: Poor                              Cognition Arousal/Alertness: Awake/alert Behavior During Therapy: WFL for tasks assessed/performed Overall Cognitive Status: Within Functional Limits for tasks assessed                                        Exercises Total Joint Exercises Ankle Circles/Pumps: AROM;Both;10 reps;Seated Hip ABduction/ADduction: AROM;Strengthening;Both;10 reps;Seated Straight Leg Raises: AROM;Strengthening;Both;10 reps;Seated (long sit) Other Exercises Other Exercises: SAQ x 10 bilaterally    General Comments        Pertinent Vitals/Pain Pain Assessment: Faces Faces Pain Scale: Hurts even more Pain Location: L arm/shoulder Pain Descriptors / Indicators: Aching;Constant;Discomfort Pain Intervention(s): Limited activity within patient's tolerance;Monitored during session;Repositioned  Home Living                      Prior Function            PT Goals (current goals can now be found in the care plan section) Progress towards PT goals: Progressing toward goals    Frequency    7X/week      PT Plan Current plan remains appropriate    Co-evaluation              AM-PAC PT "6 Clicks" Daily Activity  Outcome Measure  Difficulty turning over in bed (including adjusting bedclothes, sheets and  blankets)?: Unable Difficulty moving from lying on back to sitting on the side of the bed? : Unable Difficulty sitting down on and standing up from a chair with arms (e.g., wheelchair, bedside commode, etc,.)?: Unable Help needed moving to and from a bed to chair (including a wheelchair)?: A Little Help needed walking in hospital room?: A Little Help needed climbing 3-5 steps with a railing? : A Lot 6 Click Score: 11    End of Session Equipment Utilized During Treatment: Oxygen Activity Tolerance: Patient tolerated treatment well;Patient limited by pain Patient left: in chair;with call bell/phone within reach;with chair alarm set         Time: 1320-1350 PT Time Calculation (min) (ACUTE ONLY): 30 min  Charges:  $Therapeutic Exercise: 8-22 mins $Therapeutic Activity: 8-22 mins                       Solyana Nonaka  Deborh Pense, PTA 04/30/2017, 2:00 PM

## 2017-04-30 NOTE — Consult Note (Signed)
   [facility] CM Inpatient Consult   04/30/2017  Kimberly [name] Aug 19, 1927 [id number]   Patient screened for potential [place] [facility] services. Patient is on the [facility] registry as a benefit of their [employer] medicare . Spoke with inpatient case manager and she felt  patient's discharge plan will be SNF.  [facility] Care Management services not appropriate at this time. If patient's post hospital needs change please place a [facility] Care Management consult. For questions please contact:   Lucielle Vokes RN, [place] [facility] Liaison  [phone]) Business Mobile [phone]) Toll free office

## 2017-04-30 NOTE — Progress Notes (Signed)
PT Cancellation Note  Patient Details Name: Kimberly [name] MRN: [id number] DOB: [birth year]   Cancelled Treatment:    Reason Eval/Treat Not Completed: Patient at procedure or test/unavailable. Pt attempted to be seen however pt with labored breathing and RT entering room to provide stat breathing treatment. Will continue efforts.    [name] 04/30/2017, 12:34 PM

## 2017-04-30 NOTE — Progress Notes (Addendum)
Patient has large purple bruise on left shoulder/arm area with white bumps. MD notified, area marked, will continue to monitor.

## 2017-04-30 NOTE — Progress Notes (Signed)
[facility]  ROUNDING NOTE   Subjective:   Daughter at bedside.   Patient a bit confused this morning.   Na 126 (123)  Objective:  Vital signs in last 24 hours:  Temp:  [98.5 F (36.9 C)-99.3 F (37.4 C)] 98.9 F (37.2 C) (09/28 0933) Pulse Rate:  [65-74] 68 (09/28 0933) Resp:  [16-20] 20 (09/28 0732) BP: (99-143)/(50-79) 116/50 (09/28 0933) SpO2:  [91 %-98 %] 98 % (09/28 0933) Weight:  [78.4 kg (172 lb 12.8 oz)] 78.4 kg (172 lb 12.8 oz) (09/28 1610)  Weight change: -0.862 kg (-1 lb 14.4 oz) Filed Weights   04/28/17 0500 04/29/17 0350 04/30/17 9604  Weight: 83.7 kg (184 lb 9.6 oz) 79.2 kg (174 lb 11.2 oz) 78.4 kg (172 lb 12.8 oz)    Intake/Output: I/O last 3 completed shifts: In: -  Out: 2300 [Urine:2300]   Intake/Output this shift:  Total I/O In: -  Out: 100 [Urine:100]  Physical Exam: General: NAD, laying in bed  Head: Normocephalic, atraumatic. Moist oral mucosal membranes  Eyes: Anicteric, PERRL  Neck: Supple, trachea midline  Lungs:  Clear to auscultation  Heart: Regular rate and rhythm  Abdomen:  Soft, nontender,   Extremities: no peripheral edema.  Neurologic: Nonfocal, moving all four extremities  Skin: No lesions       Basic Metabolic Panel:  Recent Labs Lab 04/27/17 1612  04/28/17 0617 04/28/17 0954 04/28/17 1753 04/29/17 0152 04/29/17 1458 04/30/17 0347  NA 114*  < > 118* 120* 121* 123* 123* 126*  K 3.9  --  3.4* 3.4*  --   --  4.1 4.5  CL 79*  --  85* 86*  --   --  88* 92*  CO2 24  --  25 26  --   --  26 28  GLUCOSE 95  --  80 125*  --   --  154* 110*  BUN 18  --  17 16  --   --  15 16  CREATININE 1.11*  --  1.15* 0.97  --   --  1.06* 1.05*  CALCIUM 7.9*  --  7.6* 7.6*  --   --  7.8* 8.0*  < > = values in this interval not displayed.  Liver Function Tests:  Recent Labs Lab 04/27/17 1612  AST 17  ALT 12*  ALKPHOS 84  BILITOT 0.4  PROT 5.9*  ALBUMIN 3.3*   No results for input(s): LIPASE, AMYLASE in the last  168 hours. No results for input(s): AMMONIA in the last 168 hours.  CBC:  Recent Labs Lab 04/27/17 1612 04/28/17 0617  WBC 7.0 5.1  NEUTROABS 5.5  --   HGB 9.5* 8.8*  HCT 27.0* 25.3*  MCV 84.9 83.7  PLT 160 149*    Cardiac Enzymes:  Recent Labs Lab 04/27/17 1612  TROPONINI 0.04*    BNP: Invalid input(s): POCBNP  CBG: No results for input(s): GLUCAP in the last 168 hours.  Microbiology: Results for orders placed or performed in visit on 12/07/16  Urine culture     Status: None   Collection Time: 12/07/16  3:56 PM  Result Value Ref Range Status   Culture KLEBSIELLA PNEUMONIAE  Final   Colony Count Greater than 100,000 CFU/mL  Final   Organism ID, Bacteria KLEBSIELLA PNEUMONIAE  Final      Susceptibility   Klebsiella pneumoniae -  (no method available)    AMPICILLIN >=32 Resistant     AMOX/CLAVULANIC <=2 Sensitive     AMPICILLIN/SULBACTAM 8  Sensitive     PIP/TAZO <=4 Sensitive     IMIPENEM <=0.25 Sensitive     CEFAZOLIN <=4 Not Reportable     CEFTRIAXONE <=1 Sensitive     CEFTAZIDIME <=1 Sensitive     CEFEPIME <=1 Sensitive     GENTAMICIN <=1 Sensitive     TOBRAMYCIN <=1 Sensitive     CIPROFLOXACIN <=0.25 Sensitive     LEVOFLOXACIN <=0.12 Sensitive     NITROFURANTOIN 64 Intermediate     TRIMETH/SULFA* <=20 Sensitive      * NR=NOT REPORTABLE,SEE COMMENTORAL therapy:A cefazolin MIC of <32 predicts susceptibility to the oral agents cefaclor,cefdinir,cefpodoxime,cefprozil,cefuroxime,cephalexin,and loracarbef when used for therapy of uncomplicated UTIs due to E.coli,K.pneumomiae,and P.mirabilis. PARENTERAL therapy: A cefazolinMIC of >8 indicates resistance to parenteralcefazolin. An alternate test method must beperformed to confirm susceptibility to parenteralcefazolin.    Coagulation Studies: No results for input(s): LABPROT, INR in the last 72 hours.  Urinalysis:  Recent Labs  04/27/17 1612  COLORURINE YELLOW*  LABSPEC 1.008  PHURINE 5.0  GLUCOSEU  NEGATIVE  HGBUR NEGATIVE  BILIRUBINUR NEGATIVE  KETONESUR NEGATIVE  PROTEINUR 30*  NITRITE NEGATIVE  LEUKOCYTESUR NEGATIVE      Imaging: No results found.   Medications:    . aspirin EC  81 mg Oral Daily  . carvedilol  3.125 mg Oral BID WC  . docusate sodium  100 mg Oral BID  . enoxaparin (LOVENOX) injection  40 mg Subcutaneous Q24H  . furosemide  40 mg Intravenous Q12H  . levothyroxine  25 mcg Oral QAC breakfast  . losartan  25 mg Oral Daily  . pantoprazole  40 mg Oral Daily  . sodium chloride flush  3 mL Intravenous Q12H   acetaminophen **OR** acetaminophen, albuterol, fluticasone, guaiFENesin, ondansetron **OR** ondansetron (ZOFRAN) IV, oxyCODONE, polyethylene glycol, zolpidem  Assessment/ Plan:  Kimberly [name] is a 83 y.o. white female with congestive heart failure systolic, hypertension, anemia, hypothyroidism, GERD, hyperlipidemia, GERD, coronary artery disease, who was admitted to [facility] on 04/27/2017   1. Hyponatremia: acute on chronic. Hypervolemic. Hypo-osmolar.  Status post hypertonic saline on admission 9/25 Secondary to congestive heart failure.  Tolvaptan 15mg  given on 9/26 and 9/27 - fluid restriction - restart furosemide.   2. Hypertension and systolic congestive heart failure: acute exacerbation - fluid restriction - Furosemide.   3. Anemia: hemoglobin 8.8. Normocytic.    LOS: 3 Kimberly [name] 9/28/201811:10 AM

## 2017-05-01 LAB — CBC WITH DIFFERENTIAL/PLATELET
Basophils Absolute: 0 10*3/uL (ref 0–0.1)
Basophils Relative: 0 %
Eosinophils Absolute: 0 10*3/uL (ref 0–0.7)
Eosinophils Relative: 0 %
HEMATOCRIT: 28.3 % — AB (ref 35.0–47.0)
Hemoglobin: 9.5 g/dL — ABNORMAL LOW (ref 12.0–16.0)
LYMPHS PCT: 6 %
Lymphs Abs: 0.4 10*3/uL — ABNORMAL LOW (ref 1.0–3.6)
MCH: 29.5 pg (ref 26.0–34.0)
MCHC: 33.6 g/dL (ref 32.0–36.0)
MCV: 87.6 fL (ref 80.0–100.0)
MONO ABS: 0.7 10*3/uL (ref 0.2–0.9)
MONOS PCT: 10 %
Neutro Abs: 6.4 10*3/uL (ref 1.4–6.5)
Neutrophils Relative %: 84 %
Platelets: 173 10*3/uL (ref 150–440)
RBC: 3.23 MIL/uL — ABNORMAL LOW (ref 3.80–5.20)
RDW: 14.4 % (ref 11.5–14.5)
WBC: 7.6 10*3/uL (ref 3.6–11.0)

## 2017-05-01 LAB — BASIC METABOLIC PANEL
Anion gap: 9 (ref 5–15)
BUN: 18 mg/dL (ref 6–20)
CHLORIDE: 89 mmol/L — AB (ref 101–111)
CO2: 28 mmol/L (ref 22–32)
CREATININE: 1.07 mg/dL — AB (ref 0.44–1.00)
Calcium: 8.1 mg/dL — ABNORMAL LOW (ref 8.9–10.3)
GFR calc non Af Amer: 45 mL/min — ABNORMAL LOW (ref >60)
GFR, EST AFRICAN AMERICAN: 53 mL/min — AB (ref >60)
Glucose, Bld: 126 mg/dL — ABNORMAL HIGH (ref 65–99)
Potassium: 4.7 mmol/L (ref 3.5–5.1)
SODIUM: 126 mmol/L — AB (ref 135–145)

## 2017-05-01 MED ORDER — POTASSIUM CHLORIDE ER 10 MEQ PO TBCR: 10.0000 meq | EXTENDED_RELEASE_TABLET | Freq: Every day | ORAL | Status: AC

## 2017-05-01 MED ORDER — METHYLPREDNISOLONE SODIUM SUCC 125 MG IJ SOLR
60.0000 mg | INTRAMUSCULAR | Status: DC
Start: 2017-05-01 — End: 2017-05-01
  Administered 2017-05-01: 60 mg via INTRAVENOUS
  Filled 2017-05-01: qty 2

## 2017-05-01 MED ORDER — FUROSEMIDE 40 MG PO TABS: 40.0000 mg | ORAL_TABLET | Freq: Two times a day (BID) | ORAL | Status: AC

## 2017-05-01 MED ORDER — OXYCODONE HCL 5 MG PO TABS: 5.0000 mg | ORAL_TABLET | Freq: Four times a day (QID) | ORAL | 0 refills | Status: AC | PRN

## 2017-05-01 NOTE — Discharge Instructions (Addendum)
Heart Failure Clinic appointment on May 06 2017 at 11:00am with Darylene Price, [place]. Please call [phone] to reschedule.    - Daily fluids < 2 liters. - Low salt diet - Check weight everyday and keep log. Take to your doctors appt. - Take extra dose of lasix if you gain more than 3 pounds weight.

## 2017-05-01 NOTE — Clinical Social Work Note (Signed)
Patient will discharge to [facility] today via non emergent EMS. The patient's daughter and facility are aware and in agreement. The facility has received all documentation and the packet has been delivered to the chart. CSW will continue to follow pending additional discharge needs.  Santiago Bumpers, MSW, [facility] Presser [phone]

## 2017-05-01 NOTE — Progress Notes (Signed)
[facility]  ROUNDING NOTE   Subjective:   Daughter at bedside.   Being discharged to SNF  Objective:  Vital signs in last 24 hours:  Temp:  [98.3 F (36.8 C)-100.2 F (37.9 C)] 98.3 F (36.8 C) (09/29 1137) Pulse Rate:  [70-109] 78 (09/29 1137) Resp:  [16-18] 18 (09/29 1137) BP: (130-157)/(52-72) 130/52 (09/29 1137) SpO2:  [94 %-97 %] 94 % (09/29 1137) Weight:  [77.9 kg (171 lb 12.8 oz)] 77.9 kg (171 lb 12.8 oz) (09/29 0450)  Weight change: -0.454 kg (-1 lb) Filed Weights   04/29/17 0350 04/30/17 0613 05/01/17 0450  Weight: 79.2 kg (174 lb 11.2 oz) 78.4 kg (172 lb 12.8 oz) 77.9 kg (171 lb 12.8 oz)    Intake/Output: I/O last 3 completed shifts: In: 240 [P.O.:240] Out: 1650 [Urine:1650]   Intake/Output this shift:  Total I/O In: 240 [P.O.:240] Out: 250 [Urine:250]  Physical Exam: General: NAD, laying in bed  Head: Normocephalic, atraumatic. Moist oral mucosal membranes  Eyes: Anicteric, PERRL  Neck: Supple, trachea midline  Lungs:  Clear to auscultation  Heart: Regular rate and rhythm  Abdomen:  Soft, nontender,   Extremities: no peripheral edema.  Neurologic: Nonfocal, moving all four extremities  Skin: No lesions       Basic Metabolic Panel:  Recent Labs Lab 04/28/17 0617 04/28/17 0954 04/28/17 1753 04/29/17 0152 04/29/17 1458 04/30/17 0347 05/01/17 0530  NA 118* 120* 121* 123* 123* 126* 126*  K 3.4* 3.4*  --   --  4.1 4.5 4.7  CL 85* 86*  --   --  88* 92* 89*  CO2 25 26  --   --  26 28 28   GLUCOSE 80 125*  --   --  154* 110* 126*  BUN 17 16  --   --  15 16 18   CREATININE 1.15* 0.97  --   --  1.06* 1.05* 1.07*  CALCIUM 7.6* 7.6*  --   --  7.8* 8.0* 8.1*    Liver Function Tests:  Recent Labs Lab 04/27/17 1612  AST 17  ALT 12*  ALKPHOS 84  BILITOT 0.4  PROT 5.9*  ALBUMIN 3.3*   No results for input(s): LIPASE, AMYLASE in the last 168 hours. No results for input(s): AMMONIA in the last 168 hours.  CBC:  Recent  Labs Lab 04/27/17 1612 04/28/17 0617 05/01/17 0530  WBC 7.0 5.1 7.6  NEUTROABS 5.5  --  6.4  HGB 9.5* 8.8* 9.5*  HCT 27.0* 25.3* 28.3*  MCV 84.9 83.7 87.6  PLT 160 149* 173    Cardiac Enzymes:  Recent Labs Lab 04/27/17 1612  TROPONINI 0.04*    BNP: Invalid input(s): POCBNP  CBG: No results for input(s): GLUCAP in the last 168 hours.  Microbiology: Results for orders placed or performed in visit on 12/07/16  Urine culture     Status: None   Collection Time: 12/07/16  3:56 PM  Result Value Ref Range Status   Culture KLEBSIELLA PNEUMONIAE  Final   Colony Count Greater than 100,000 CFU/mL  Final   Organism ID, Bacteria KLEBSIELLA PNEUMONIAE  Final      Susceptibility   Klebsiella pneumoniae -  (no method available)    AMPICILLIN >=32 Resistant     AMOX/CLAVULANIC <=2 Sensitive     AMPICILLIN/SULBACTAM 8 Sensitive     PIP/TAZO <=4 Sensitive     IMIPENEM <=0.25 Sensitive     CEFAZOLIN <=4 Not Reportable     CEFTRIAXONE <=1 Sensitive  CEFTAZIDIME <=1 Sensitive     CEFEPIME <=1 Sensitive     GENTAMICIN <=1 Sensitive     TOBRAMYCIN <=1 Sensitive     CIPROFLOXACIN <=0.25 Sensitive     LEVOFLOXACIN <=0.12 Sensitive     NITROFURANTOIN 64 Intermediate     TRIMETH/SULFA* <=20 Sensitive      * NR=NOT REPORTABLE,SEE COMMENTORAL therapy:A cefazolin MIC of <32 predicts susceptibility to the oral agents cefaclor,cefdinir,cefpodoxime,cefprozil,cefuroxime,cephalexin,and loracarbef when used for therapy of uncomplicated UTIs due to E.coli,K.pneumomiae,and P.mirabilis. PARENTERAL therapy: A cefazolinMIC of >8 indicates resistance to parenteralcefazolin. An alternate test method must beperformed to confirm susceptibility to parenteralcefazolin.    Coagulation Studies: No results for input(s): LABPROT, INR in the last 72 hours.  Urinalysis: No results for input(s): COLORURINE, LABSPEC, PHURINE, GLUCOSEU, HGBUR, BILIRUBINUR, KETONESUR, PROTEINUR, UROBILINOGEN, NITRITE,  LEUKOCYTESUR in the last 72 hours.  Invalid input(s): APPERANCEUR    Imaging: Dg Chest Port 1 View  Result Date: 04/30/2017 CLINICAL DATA:  Cough EXAM: PORTABLE CHEST 1 VIEW COMPARISON:  April 27, 2017 FINDINGS: There is stable elevation of the right hemidiaphragm. There is no edema or consolidation. There is cardiomegaly with pulmonary vascular within normal limits. There is aortic atherosclerosis. No adenopathy. No bone lesions evident. Calcification is noted in the left carotid artery. IMPRESSION: No edema or consolidation. Stable cardiac prominence. There is aortic atherosclerosis as well as calcification in the left carotid artery. Aortic Atherosclerosis (ICD10-I70.0). Electronically Signed   By: Lowella Grip III M.D.   On: 04/30/2017 12:40     Medications:    . aspirin EC  81 mg Oral Daily  . carvedilol  3.125 mg Oral BID WC  . docusate sodium  100 mg Oral BID  . enoxaparin (LOVENOX) injection  40 mg Subcutaneous Q24H  . levothyroxine  25 mcg Oral QAC breakfast  . losartan  25 mg Oral Daily  . methylPREDNISolone (SOLU-MEDROL) injection  60 mg Intravenous Q24H  . pantoprazole  40 mg Oral Daily  . sodium chloride flush  3 mL Intravenous Q12H   acetaminophen **OR** acetaminophen, albuterol, fluticasone, guaiFENesin, ondansetron **OR** ondansetron (ZOFRAN) IV, oxyCODONE, polyethylene glycol, zolpidem  Assessment/ Plan:  Kimberly [name] is a 83 y.o. white female with congestive heart failure systolic, hypertension, anemia, hypothyroidism, GERD, hyperlipidemia, GERD, coronary artery disease, who was admitted to [facility] on 04/27/2017   1. Hyponatremia: acute on chronic. Hypervolemic. Hypo-osmolar.  Status post hypertonic saline on admission 9/25 Secondary to congestive heart failure.  Tolvaptan 15mg  given on 9/26 and 9/27 - fluid restriction - Continue furosemide.   2. Hypertension and systolic congestive heart failure: acute exacerbation - fluid restriction - Furosemide.    3. Anemia: hemoglobin 8.8. Normocytic.    LOS: [place], Kimberly [name] 9/29/20182:03 PM

## 2017-05-01 NOTE — Clinical Social Work Placement (Signed)
   CLINICAL SOCIAL WORK PLACEMENT  NOTE  Date:  05/01/2017  Patient Details  Name: MARILEA GWYNNE MRN: [id number] Date of Birth: 10/22/1927  Clinical Social Work is seeking post-discharge placement for this patient at the [place] level of care (*CSW will initial, date and re-position this form in  chart as items are completed):  Yes   Patient/family provided with [place] Work Department's list of facilities offering this level of care within the geographic area requested by the patient (or if unable, by the patient's family).  Yes   Patient/family informed of their freedom to choose among providers that offer the needed level of care, that participate in Medicare, Medicaid or managed care program needed by the patient, have an available bed and are willing to accept the patient.  Yes   Patient/family informed of [place]'s ownership interest in [facility] and [facility], as well as of the fact that they are under no obligation to receive care at these facilities.  PASRR submitted to EDS on 04/29/17     PASRR number received on 04/29/17     Existing PASRR number confirmed on       FL2 transmitted to all facilities in geographic area requested by pt/family on 04/29/17     FL2 transmitted to all facilities within larger geographic area on       Patient informed that his/her managed care company has contracts with or will negotiate with certain facilities, including the following:        Yes   Patient/family informed of bed offers received.  Patient chooses bed at [place], [place]     Physician recommends and patient chooses bed at      Patient to be transferred to [place], [place] on  .  Patient to be transferred to facility by       Patient family notified on   of transfer.  Name of family member notified:        PHYSICIAN Please sign FL2, Please sign DNR     Additional Comment:     _______________________________________________ Zettie Pho, LCSW 05/01/2017, 12:48 PM

## 2017-05-01 NOTE — Progress Notes (Signed)
Physical Therapy Treatment Patient Details Name: Kimberly [name] MRN: [id number] DOB: 1928-06-29 Today's Date: 05/01/2017    History of Present Illness Pt is a 83 y.o.femalewith a known history of chronic systolic CHF with ejection fraction of 35%, hypertension, anemia of chronic disease presents to the emergency room after patient had episode of syncope and was found fallen on the floor by family.  She had pain in left shoulder area. Here in the emergency room patient has been found to have left humerus fracture, sodium severely low at 113. Orthopedics consulted and suggested non-surgical intervention with a sling placed.  Per neuro pt also has a h/o T11 burst fracture with back pain that has worsened minimally since recent fall.    PT Comments    Pt agreed to session and was able to get out of bed with mod assist and transfer to recliner with min assist.  Balance and pain in LUE remain limiting factors for mobility.  She was able to tolerate increased standing time this session.  Discussed at length with pt and daughter SNF expectations and experience.  Pt voiced feeling anxious about transfer as she did not know where she was going and had never been to rehab before.  Encouragement given.  Anticipate discharge to rehab today.   Follow Up Recommendations  SNF     Equipment Recommendations       Recommendations for Other Services       Precautions / Restrictions Precautions Precautions: Fall Required Braces or Orthoses: Sling;Other Brace/Splint Restrictions Weight Bearing Restrictions: No LUE Weight Bearing: Non weight bearing    Mobility  Bed Mobility Overal bed mobility: Needs Assistance Bed Mobility: Supine to Sit     Supine to sit: Mod assist     General bed mobility comments: Mod A to get BLEs off of the bed and to sit upright; log roll training education provided secondary to T11 burst fracture  Transfers Overall transfer level: Needs assistance Equipment used:  Hemi-walker Transfers: Sit to/from Stand Sit to Stand: Min assist            Ambulation/Gait Ambulation/Gait assistance: Min assist Ambulation Distance (Feet): 3 Feet Assistive device: Hemi-walker Gait Pattern/deviations: Step-to pattern   Gait velocity interpretation: <1.8 ft/sec, indicative of risk for recurrent falls General Gait Details: forward flexed posture and cues for posture placement of HW   Stairs            Wheelchair Mobility    Modified Rankin (Stroke Patients Only)       Balance Overall balance assessment: Needs assistance Sitting-balance support: Feet supported Sitting balance-Leahy Scale: Fair     Standing balance support: Single extremity supported Standing balance-Leahy Scale: Poor                              Cognition Arousal/Alertness: Awake/alert Behavior During Therapy: WFL for tasks assessed/performed Overall Cognitive Status: Within Functional Limits for tasks assessed                                        Exercises Other Exercises Other Exercises: Pt was able to stand with HW after transfer x 3 minutes before needing to sit.  While standing marching in place x 10    General Comments        Pertinent Vitals/Pain Pain Assessment: 0-10 Pain Score: 6  Pain Location: L  arm/shoulder Pain Descriptors / Indicators: Aching;Constant;Discomfort    Home Living                      Prior Function            PT Goals (current goals can now be found in the care plan section) Progress towards PT goals: Progressing toward goals    Frequency    7X/week      PT Plan Current plan remains appropriate    Co-evaluation              AM-PAC PT "6 Clicks" Daily Activity  Outcome Measure  Difficulty turning over in bed (including adjusting bedclothes, sheets and blankets)?: Unable Difficulty moving from lying on back to sitting on the side of the bed? : Unable Difficulty sitting down  on and standing up from a chair with arms (e.g., wheelchair, bedside commode, etc,.)?: Unable Help needed moving to and from a bed to chair (including a wheelchair)?: A Little Help needed walking in hospital room?: A Little Help needed climbing 3-5 steps with a railing? : A Lot 6 Click Score: 11    End of Session Equipment Utilized During Treatment: Oxygen;Gait belt Activity Tolerance: Patient tolerated treatment well;Patient limited by fatigue Patient left: in chair;with chair alarm set;with call bell/phone within reach;with family/visitor present         Time: 2841-3244 PT Time Calculation (min) (ACUTE ONLY): 16 min  Charges:  $Therapeutic Activity: 8-22 mins                    G Codes:       Chesley Noon, PTA 05/01/17, 11:41 AM

## 2017-05-01 NOTE — Discharge Summary (Signed)
[facility] [place] NAME: Kimberly [name]    MR#:  [id number]  DATE OF BIRTH:  1928/07/26  DATE OF ADMISSION:  04/27/2017 ADMITTING PHYSICIAN: Hillary Bow, MD  DATE OF DISCHARGE: 05/01/2017  PRIMARY CARE PHYSICIAN: Tower, Wynelle Fanny, MD   ADMISSION DIAGNOSIS:  Hyponatremia [E87.1] Closed stable burst fracture of twelfth thoracic vertebra with routine healing, subsequent encounter [S22.081D] Closed fracture of proximal end of left humerus, unspecified fracture morphology, initial encounter [S42.202A]  DISCHARGE DIAGNOSIS:  Active Problems:   CHF (congestive heart failure) ([place])   SECONDARY DIAGNOSIS:   Past Medical History:  Diagnosis Date  . Allergy    allergic rhinitis  . Chronic combined systolic and diastolic CHF (congestive heart failure) ([place])    a. 01/2014 Echo: EF 50-55%, no rwma, Gr1 DD, triv AI, mild MR;  b. 01/2017 Echo: EF 35%, diff HK, gr2  DD, fibrocalcific AoV w/o AS, mild AI/MR, sev dil LA, PASP 20mmHg.  Marland Kitchen GERD (gastroesophageal reflux disease)   . Hyperlipidemia   . Hypertension   . Hypothyroid   . Insomnia   . Non-obstructive CAD (coronary artery disease)    a. Non-obstructive dzs by caths in 2001 and 2006;  b. 03/2017 Cath: LM nl, LAD 30p, LCX nl, RCA 89m/d, EF 45-50%.     ADMITTING HISTORY   HISTORY OF PRESENT ILLNESS:  Kimberly [name]  is a 83 y.o. female with a known history of chronic systolic CHF with ejection fraction of 35%, hypertension, anemia of chronic disease presents to the emergency room after patient had episode of syncope and was found fallen on the floor by family.She had pain in left shoulder area. Here in the emergency room patient has been found to have left humerus fracture, sodium severely low at 113. Orthopedics consulted and suggested a sling being placed. Hypertonic normal saline started. Patient's sodium has been low up to 126 reviewing recent blood numbers from primary care physician. Her weight during  last discharge was 170 pounds. Today she is 180 pounds. Her Lasix was recently increased from 20 mg once a day to 40 mg once a day by PCP on the 19th.    HOSPITAL COURSE:   83 year old female with past medical history of hypertension, hypothyroidism, hyperlipidemia, GERD, history of chronic systolic CHF who presented to the hospital after a fall and noted to have a left humeral fracture and also noted to be severely hyponatremic.  * Hypovolemic hyponatremia due to acute on chronic systolic congestive heart failure  Improved with Lasix. But due to severely low sodium started on Tolvaptan. Changed to lasix . Sodium improving but still low. Stable at 126. - Discussed with Dr. Juleen China of nephrology Has chronic hyponatremia. Continue Lasix 40 mg twice a day. Potassium supplements added. Daily weight. Extra dose of Lasix if greater than 3 pound weight gain. Follow-up with nephrology and cardiology in 1-2 weeks.  * Left humeral fracture-seen by orthopedics and no acute surgical intervention.  Sling in place. Needs outpatient follow-up. Dr. Roland Rack  * T11 burst fracture-patient has a history of chronic back issues and pain. Neurosurgery was consulted and appreciate input. Continue supportive care for now and there is no need for acute surgical intervention presently.  * Essential HTN - continue carvedilol, losartan.  * Hypothyroidism-continue Synthroid.  * Mild cognitive impairment. Stable  Patient has poor long-term prognosis due to congestive heart failure and hyponatremia. Presently he is being discharged to skilled nursing facility for physical therapy. Will need palliative care  following at the facility.  CONSULTS OBTAINED:  Treatment Team:  Poggi, Marshall Cork, MD Lavonia Dana, MD  DRUG ALLERGIES:   Allergies  Allergen Reactions  . Ciprofloxacin Other (See Comments)    REACTION: burning of mouth and skin  . Lisinopril Cough  . Remeron [Mirtazapine] Other (See Comments)     constipation  . Sulfonamide Derivatives Other (See Comments)    REACTION: malaise  . Tetanus Toxoid Swelling  . Vesicare [Solifenacin Succinate] Other (See Comments)    Blurred vision and weakness  . Zoloft [Sertraline Hcl] Nausea And Vomiting  . Cephalexin Rash and Other (See Comments)    REACTION: rash and burning sensation to skin on arms and legs with itching.  Madelin Headings [Amitriptyline] Palpitations  . Penicillins Rash and Other (See Comments)    Has patient had a PCN reaction causing immediate rash, facial/tongue/throat swelling, SOB or lightheadedness with hypotension: Unknown Has patient had a PCN reaction causing severe rash involving mucus membranes or skin necrosis: Unknown Has patient had a PCN reaction that required hospitalization: Unknown Has patient had a PCN reaction occurring within the last 10 years: No If all of the above answers are "NO", then may proceed with Cephalosporin use. Has since take Augmentin with no problem    DISCHARGE MEDICATIONS:   Current Discharge Medication List    START taking these medications   Details  oxyCODONE (OXY IR/ROXICODONE) 5 MG immediate release tablet Take 1 tablet (5 mg total) by mouth every 6 (six) hours as needed for moderate pain or severe pain. Qty: 20 tablet, Refills: 0    potassium chloride (K-DUR) 10 MEQ tablet Take 1 tablet (10 mEq total) by mouth daily.      CONTINUE these medications which have CHANGED   Details  furosemide (LASIX) 40 MG tablet Take 1 tablet (40 mg total) by mouth 2 (two) times daily.      CONTINUE these medications which have NOT CHANGED   Details  aspirin 81 MG tablet Take 81 mg by mouth daily.      carvedilol (COREG) 3.125 MG tablet Take 1 tablet (3.125 mg total) by mouth 2 (two) times daily with a meal. Please keep 05/20/17 appt for future refills Qty: 60 tablet, Refills: 0    cetirizine (ZYRTEC) 10 MG tablet Take 10 mg by mouth daily as needed for allergies.     docusate sodium 100 MG  CAPS Take 100 mg by mouth 2 (two) times daily. For constipation Qty: 60 capsule, Refills: 3    esomeprazole (NEXIUM) 40 MG capsule TAKE 1 CAPSULE (40 MG TOTAL) BY MOUTH DAILY. Qty: 90 capsule, Refills: 2    fluticasone (FLONASE) 50 MCG/ACT nasal spray Place 2 sprays into both nostrils daily. Qty: 48 g, Refills: 3    guaiFENesin (MUCINEX) 600 MG 12 hr tablet Take 600 mg by mouth 2 (two) times daily as needed for cough or to loosen phlegm.     levothyroxine (SYNTHROID, LEVOTHROID) 25 MCG tablet Take 1 tablet (25 mcg total) by mouth daily. Qty: 90 tablet, Refills: 3    losartan (COZAAR) 25 MG tablet TAKE 1 TABLET BY MOUTH EVERY DAY Qty: 30 tablet, Refills: 3    Multiple Vitamin (MULTIVITAMIN WITH MINERALS) TABS tablet Take 1 tablet by mouth daily.    zolpidem (AMBIEN) 10 MG tablet TAKE 1/2 TO 1 TABLET BY MOUTH DAILY AT BEDTIME AS NEEDED Qty: 30 tablet, Refills: 3      STOP taking these medications     spironolactone (ALDACTONE) 25 MG tablet  Today   VITAL SIGNS:  Blood pressure (!) 140/55, pulse 83, temperature 98.7 F (37.1 C), temperature source Oral, resp. rate 16, height 5\' 3"  (1.6 m), weight 77.9 kg (171 lb 12.8 oz), last menstrual period 08/03/1978, SpO2 95 %.  I/O:   Intake/Output Summary (Last 24 hours) at 05/01/17 1128 Last data filed at 05/01/17 0900  Gross per 24 hour  Intake              480 ml  Output             1250 ml  Net             -770 ml    PHYSICAL EXAMINATION:  Physical Exam  GENERAL:  83 y.o.-year-old patient lying in the bed with no acute distress.  LUNGS: Normal breath sounds bilaterally, no wheezing, rales,rhonchi or crepitation. No use of accessory muscles of respiration.  CARDIOVASCULAR: S1, S2 normal. No murmurs, rubs, or gallops.  ABDOMEN: Soft, non-tender, non-distended. Bowel sounds present. No organomegaly or mass.  NEUROLOGIC: Moves all 4 extremities. PSYCHIATRIC: The patient is alert and oriented x 3.  SKIN: No obvious  rash, lesion, or ulcer.  Left arm sling with brusing from fracture  DATA REVIEW:   CBC  Recent Labs Lab 05/01/17 0530  WBC 7.6  HGB 9.5*  HCT 28.3*  PLT 173    Chemistries   Recent Labs Lab 04/27/17 1612  05/01/17 0530  NA 114*  < > 126*  K 3.9  < > 4.7  CL 79*  < > 89*  CO2 24  < > 28  GLUCOSE 95  < > 126*  BUN 18  < > 18  CREATININE 1.11*  < > 1.07*  CALCIUM 7.9*  < > 8.1*  AST 17  --   --   ALT 12*  --   --   ALKPHOS 84  --   --   BILITOT 0.4  --   --   < > = values in this interval not displayed.  Cardiac Enzymes  Recent Labs Lab 04/27/17 1612  TROPONINI 0.04*    Microbiology Results  Results for orders placed or performed in visit on 12/07/16  Urine culture     Status: None   Collection Time: 12/07/16  3:56 PM  Result Value Ref Range Status   Culture KLEBSIELLA PNEUMONIAE  Final   Colony Count Greater than 100,000 CFU/mL  Final   Organism ID, Bacteria KLEBSIELLA PNEUMONIAE  Final      Susceptibility   Klebsiella pneumoniae -  (no method available)    AMPICILLIN >=32 Resistant     AMOX/CLAVULANIC <=2 Sensitive     AMPICILLIN/SULBACTAM 8 Sensitive     PIP/TAZO <=4 Sensitive     IMIPENEM <=0.25 Sensitive     CEFAZOLIN <=4 Not Reportable     CEFTRIAXONE <=1 Sensitive     CEFTAZIDIME <=1 Sensitive     CEFEPIME <=1 Sensitive     GENTAMICIN <=1 Sensitive     TOBRAMYCIN <=1 Sensitive     CIPROFLOXACIN <=0.25 Sensitive     LEVOFLOXACIN <=0.12 Sensitive     NITROFURANTOIN 64 Intermediate     TRIMETH/SULFA* <=20 Sensitive      * NR=NOT REPORTABLE,SEE COMMENTORAL therapy:A cefazolin MIC of <32 predicts susceptibility to the oral agents cefaclor,cefdinir,cefpodoxime,cefprozil,cefuroxime,cephalexin,and loracarbef when used for therapy of uncomplicated UTIs due to E.coli,K.pneumomiae,and P.mirabilis. PARENTERAL therapy: A cefazolinMIC of >8 indicates resistance to parenteralcefazolin. An alternate test method must beperformed to confirm susceptibility to  parenteralcefazolin.  RADIOLOGY:  Dg Chest Port 1 View  Result Date: 04/30/2017 CLINICAL DATA:  Cough EXAM: PORTABLE CHEST 1 VIEW COMPARISON:  April 27, 2017 FINDINGS: There is stable elevation of the right hemidiaphragm. There is no edema or consolidation. There is cardiomegaly with pulmonary vascular within normal limits. There is aortic atherosclerosis. No adenopathy. No bone lesions evident. Calcification is noted in the left carotid artery. IMPRESSION: No edema or consolidation. Stable cardiac prominence. There is aortic atherosclerosis as well as calcification in the left carotid artery. Aortic Atherosclerosis (ICD10-I70.0). Electronically Signed   By: Lowella Grip III M.D.   On: 04/30/2017 12:40    Follow up with PCP in 1 week.  Management plans discussed with the patient, family and they are in agreement.  CODE STATUS:     Code Status Orders        Start     Ordered   04/27/17 2008  Do not attempt resuscitation (DNR)  Continuous    Question Answer Comment  In the event of cardiac or respiratory ARREST Do not call a "code blue"   In the event of cardiac or respiratory ARREST Do not perform Intubation, CPR, defibrillation or ACLS   In the event of cardiac or respiratory ARREST Use medication by any route, position, wound care, and other measures to relive pain and suffering. May use oxygen, suction and manual treatment of airway obstruction as needed for comfort.      04/27/17 2009    Code Status History    Date Active Date Inactive Code Status Order ID Comments User Context   02/27/2017  2:05 AM 03/03/2017  8:57 PM DNR [id number]  Lance Coon, MD Inpatient   01/31/2014  8:11 PM 02/02/2014 12:50 PM DNR [id number]  Annita Brod, MD Inpatient    Advance Directive Documentation     Most Recent Value  Type of Advance Directive  Out of facility DNR (pink MOST or yellow form)  Pre-existing out of facility DNR order (yellow form or pink MOST form)  Yellow form placed in  chart (order not valid for inpatient use)  "MOST" Form in Place?  -      TOTAL TIME TAKING CARE OF THIS PATIENT ON DAY OF DISCHARGE: more than 30 minutes.   Hillary Bow R M.D on 05/01/2017 at 11:28 AM  Between 7am to 6pm - Pager - [phone]  After 6pm go to [email or web address] - password [email or web address] [facility]  Office  [phone]  CC: Primary care physician; Tower, Wynelle Fanny, MD  Note: This dictation was prepared with Dragon dictation along with smaller phrase technology. Any transcriptional errors that result from this process are unintentional.

## 2017-05-01 NOTE — Progress Notes (Signed)
Advance care planning  Discusse d with patient and her healthcare power of attorney daughter at bedside regarding patient's chronic systolic CHF and associated hyponatremia. Hyponatremia associated with CHF carries a poor prognosis. Patient has also had on and off confusion. Presently is being discharged to rehabilitation. Discussed regarding CHF instructions. Confirm CODE STATUS is DO NOT RESUSCITATE. Explained that due to poor prognosis would like palliative care following patient at rehabilitation. Daughter agrees. Will request palliative care to follow at rehabilitation.  Patient is being discharged to rehabilitation later today.  Time spent 20 minutes

## 2017-05-03 ENCOUNTER — Emergency Department (HOSPITAL_COMMUNITY): Payer: Medicare Other

## 2017-05-03 ENCOUNTER — Encounter (HOSPITAL_COMMUNITY): Payer: Self-pay | Admitting: Emergency Medicine

## 2017-05-03 ENCOUNTER — Telehealth: Payer: Self-pay | Admitting: Cardiovascular Disease

## 2017-05-03 ENCOUNTER — Inpatient Hospital Stay (HOSPITAL_COMMUNITY)
Admission: EM | Admit: 2017-05-03 | Discharge: 2017-06-03 | DRG: 871 | Disposition: E | Payer: Medicare Other | Attending: Internal Medicine | Admitting: Internal Medicine

## 2017-05-03 DIAGNOSIS — Z66 Do not resuscitate: Secondary | ICD-10-CM | POA: Diagnosis present

## 2017-05-03 DIAGNOSIS — S42292D Other displaced fracture of upper end of left humerus, subsequent encounter for fracture with routine healing: Secondary | ICD-10-CM

## 2017-05-03 DIAGNOSIS — E039 Hypothyroidism, unspecified: Secondary | ICD-10-CM | POA: Diagnosis present

## 2017-05-03 DIAGNOSIS — E871 Hypo-osmolality and hyponatremia: Secondary | ICD-10-CM | POA: Diagnosis present

## 2017-05-03 DIAGNOSIS — I5042 Chronic combined systolic (congestive) and diastolic (congestive) heart failure: Secondary | ICD-10-CM | POA: Diagnosis present

## 2017-05-03 DIAGNOSIS — S22071G Stable burst fracture of T9-T10 vertebra, subsequent encounter for fracture with delayed healing: Secondary | ICD-10-CM

## 2017-05-03 DIAGNOSIS — E785 Hyperlipidemia, unspecified: Secondary | ICD-10-CM | POA: Diagnosis present

## 2017-05-03 DIAGNOSIS — R748 Abnormal levels of other serum enzymes: Secondary | ICD-10-CM | POA: Diagnosis present

## 2017-05-03 DIAGNOSIS — Z79890 Hormone replacement therapy (postmenopausal): Secondary | ICD-10-CM

## 2017-05-03 DIAGNOSIS — A419 Sepsis, unspecified organism: Secondary | ICD-10-CM | POA: Diagnosis present

## 2017-05-03 DIAGNOSIS — S42291A Other displaced fracture of upper end of right humerus, initial encounter for closed fracture: Secondary | ICD-10-CM | POA: Diagnosis not present

## 2017-05-03 DIAGNOSIS — Z880 Allergy status to penicillin: Secondary | ICD-10-CM

## 2017-05-03 DIAGNOSIS — R7881 Bacteremia: Secondary | ICD-10-CM | POA: Diagnosis not present

## 2017-05-03 DIAGNOSIS — F419 Anxiety disorder, unspecified: Secondary | ICD-10-CM | POA: Diagnosis present

## 2017-05-03 DIAGNOSIS — Z887 Allergy status to serum and vaccine status: Secondary | ICD-10-CM

## 2017-05-03 DIAGNOSIS — D638 Anemia in other chronic diseases classified elsewhere: Secondary | ICD-10-CM | POA: Diagnosis present

## 2017-05-03 DIAGNOSIS — I5022 Chronic systolic (congestive) heart failure: Secondary | ICD-10-CM | POA: Diagnosis not present

## 2017-05-03 DIAGNOSIS — J90 Pleural effusion, not elsewhere classified: Secondary | ICD-10-CM | POA: Diagnosis present

## 2017-05-03 DIAGNOSIS — Z87440 Personal history of urinary (tract) infections: Secondary | ICD-10-CM | POA: Diagnosis not present

## 2017-05-03 DIAGNOSIS — N390 Urinary tract infection, site not specified: Secondary | ICD-10-CM | POA: Diagnosis present

## 2017-05-03 DIAGNOSIS — Y92230 Patient room in hospital as the place of occurrence of the external cause: Secondary | ICD-10-CM | POA: Diagnosis not present

## 2017-05-03 DIAGNOSIS — E875 Hyperkalemia: Secondary | ICD-10-CM | POA: Diagnosis present

## 2017-05-03 DIAGNOSIS — I110 Hypertensive heart disease with heart failure: Secondary | ICD-10-CM | POA: Diagnosis not present

## 2017-05-03 DIAGNOSIS — Z881 Allergy status to other antibiotic agents status: Secondary | ICD-10-CM

## 2017-05-03 DIAGNOSIS — Y92009 Unspecified place in unspecified non-institutional (private) residence as the place of occurrence of the external cause: Secondary | ICD-10-CM

## 2017-05-03 DIAGNOSIS — E669 Obesity, unspecified: Secondary | ICD-10-CM | POA: Diagnosis present

## 2017-05-03 DIAGNOSIS — I447 Left bundle-branch block, unspecified: Secondary | ICD-10-CM | POA: Diagnosis present

## 2017-05-03 DIAGNOSIS — Z79899 Other long term (current) drug therapy: Secondary | ICD-10-CM

## 2017-05-03 DIAGNOSIS — I130 Hypertensive heart and chronic kidney disease with heart failure and stage 1 through stage 4 chronic kidney disease, or unspecified chronic kidney disease: Secondary | ICD-10-CM | POA: Diagnosis present

## 2017-05-03 DIAGNOSIS — Z7189 Other specified counseling: Secondary | ICD-10-CM | POA: Diagnosis not present

## 2017-05-03 DIAGNOSIS — T361X5A Adverse effect of cephalosporins and other beta-lactam antibiotics, initial encounter: Secondary | ICD-10-CM | POA: Diagnosis not present

## 2017-05-03 DIAGNOSIS — N183 Chronic kidney disease, stage 3 (moderate): Secondary | ICD-10-CM | POA: Diagnosis present

## 2017-05-03 DIAGNOSIS — I080 Rheumatic disorders of both mitral and aortic valves: Secondary | ICD-10-CM | POA: Diagnosis present

## 2017-05-03 DIAGNOSIS — R6521 Severe sepsis with septic shock: Secondary | ICD-10-CM | POA: Diagnosis present

## 2017-05-03 DIAGNOSIS — Z6836 Body mass index (BMI) 36.0-36.9, adult: Secondary | ICD-10-CM | POA: Diagnosis not present

## 2017-05-03 DIAGNOSIS — I481 Persistent atrial fibrillation: Secondary | ICD-10-CM | POA: Diagnosis not present

## 2017-05-03 DIAGNOSIS — Z515 Encounter for palliative care: Secondary | ICD-10-CM | POA: Diagnosis not present

## 2017-05-03 DIAGNOSIS — I5043 Acute on chronic combined systolic (congestive) and diastolic (congestive) heart failure: Secondary | ICD-10-CM | POA: Diagnosis present

## 2017-05-03 DIAGNOSIS — R52 Pain, unspecified: Secondary | ICD-10-CM

## 2017-05-03 DIAGNOSIS — M7989 Other specified soft tissue disorders: Secondary | ICD-10-CM | POA: Diagnosis present

## 2017-05-03 DIAGNOSIS — I248 Other forms of acute ischemic heart disease: Secondary | ICD-10-CM | POA: Diagnosis present

## 2017-05-03 DIAGNOSIS — G9341 Metabolic encephalopathy: Secondary | ICD-10-CM | POA: Diagnosis present

## 2017-05-03 DIAGNOSIS — I4891 Unspecified atrial fibrillation: Secondary | ICD-10-CM | POA: Diagnosis present

## 2017-05-03 DIAGNOSIS — F0390 Unspecified dementia without behavioral disturbance: Secondary | ICD-10-CM | POA: Diagnosis present

## 2017-05-03 DIAGNOSIS — A4102 Sepsis due to Methicillin resistant Staphylococcus aureus: Secondary | ICD-10-CM | POA: Diagnosis present

## 2017-05-03 DIAGNOSIS — I272 Pulmonary hypertension, unspecified: Secondary | ICD-10-CM | POA: Diagnosis present

## 2017-05-03 DIAGNOSIS — N179 Acute kidney failure, unspecified: Secondary | ICD-10-CM | POA: Diagnosis present

## 2017-05-03 DIAGNOSIS — I2510 Atherosclerotic heart disease of native coronary artery without angina pectoris: Secondary | ICD-10-CM | POA: Diagnosis present

## 2017-05-03 DIAGNOSIS — M5126 Other intervertebral disc displacement, lumbar region: Secondary | ICD-10-CM | POA: Diagnosis present

## 2017-05-03 DIAGNOSIS — B9562 Methicillin resistant Staphylococcus aureus infection as the cause of diseases classified elsewhere: Secondary | ICD-10-CM | POA: Diagnosis present

## 2017-05-03 DIAGNOSIS — R21 Rash and other nonspecific skin eruption: Secondary | ICD-10-CM | POA: Diagnosis not present

## 2017-05-03 DIAGNOSIS — E6609 Other obesity due to excess calories: Secondary | ICD-10-CM | POA: Diagnosis not present

## 2017-05-03 DIAGNOSIS — W010XXD Fall on same level from slipping, tripping and stumbling without subsequent striking against object, subsequent encounter: Secondary | ICD-10-CM | POA: Diagnosis present

## 2017-05-03 DIAGNOSIS — J811 Chronic pulmonary edema: Secondary | ICD-10-CM | POA: Diagnosis present

## 2017-05-03 DIAGNOSIS — Z882 Allergy status to sulfonamides status: Secondary | ICD-10-CM

## 2017-05-03 DIAGNOSIS — R34 Anuria and oliguria: Secondary | ICD-10-CM | POA: Diagnosis present

## 2017-05-03 DIAGNOSIS — I10 Essential (primary) hypertension: Secondary | ICD-10-CM | POA: Diagnosis present

## 2017-05-03 DIAGNOSIS — R197 Diarrhea, unspecified: Secondary | ICD-10-CM | POA: Diagnosis not present

## 2017-05-03 DIAGNOSIS — M48061 Spinal stenosis, lumbar region without neurogenic claudication: Secondary | ICD-10-CM | POA: Diagnosis present

## 2017-05-03 DIAGNOSIS — I5023 Acute on chronic systolic (congestive) heart failure: Secondary | ICD-10-CM | POA: Diagnosis not present

## 2017-05-03 DIAGNOSIS — R0902 Hypoxemia: Secondary | ICD-10-CM

## 2017-05-03 DIAGNOSIS — R000 Tachycardia, unspecified: Secondary | ICD-10-CM | POA: Diagnosis present

## 2017-05-03 DIAGNOSIS — Z888 Allergy status to other drugs, medicaments and biological substances status: Secondary | ICD-10-CM

## 2017-05-03 DIAGNOSIS — Z7982 Long term (current) use of aspirin: Secondary | ICD-10-CM

## 2017-05-03 DIAGNOSIS — Z6833 Body mass index (BMI) 33.0-33.9, adult: Secondary | ICD-10-CM | POA: Diagnosis not present

## 2017-05-03 DIAGNOSIS — Z8249 Family history of ischemic heart disease and other diseases of the circulatory system: Secondary | ICD-10-CM

## 2017-05-03 DIAGNOSIS — K219 Gastro-esophageal reflux disease without esophagitis: Secondary | ICD-10-CM | POA: Diagnosis present

## 2017-05-03 DIAGNOSIS — Z79891 Long term (current) use of opiate analgesic: Secondary | ICD-10-CM

## 2017-05-03 DIAGNOSIS — J9601 Acute respiratory failure with hypoxia: Secondary | ICD-10-CM | POA: Diagnosis present

## 2017-05-03 HISTORY — DX: Unspecified fracture of shaft of humerus, unspecified arm, initial encounter for closed fracture: S42.309A

## 2017-05-03 HISTORY — DX: Hyperkalemia: E87.5

## 2017-05-03 HISTORY — DX: Hypoxemia: R09.02

## 2017-05-03 HISTORY — DX: Sepsis, unspecified organism: A41.9

## 2017-05-03 HISTORY — DX: Hypo-osmolality and hyponatremia: E87.1

## 2017-05-03 LAB — COMPREHENSIVE METABOLIC PANEL
ALT: 10 U/L — AB (ref 14–54)
AST: 19 U/L (ref 15–41)
Albumin: 2.6 g/dL — ABNORMAL LOW (ref 3.5–5.0)
Alkaline Phosphatase: 56 U/L (ref 38–126)
Anion gap: 11 (ref 5–15)
BILIRUBIN TOTAL: 1.1 mg/dL (ref 0.3–1.2)
BUN: 44 mg/dL — AB (ref 6–20)
CO2: 25 mmol/L (ref 22–32)
CREATININE: 2.63 mg/dL — AB (ref 0.44–1.00)
Calcium: 7.8 mg/dL — ABNORMAL LOW (ref 8.9–10.3)
Chloride: 83 mmol/L — ABNORMAL LOW (ref 101–111)
GFR, EST AFRICAN AMERICAN: 18 mL/min — AB (ref >60)
GFR, EST NON AFRICAN AMERICAN: 15 mL/min — AB (ref >60)
Glucose, Bld: 148 mg/dL — ABNORMAL HIGH (ref 65–99)
Potassium: 6 mmol/L — ABNORMAL HIGH (ref 3.5–5.1)
Sodium: 119 mmol/L — CL (ref 135–145)
TOTAL PROTEIN: 5.5 g/dL — AB (ref 6.5–8.1)

## 2017-05-03 LAB — BASIC METABOLIC PANEL
Anion gap: 8 (ref 5–15)
BUN: 44 mg/dL — ABNORMAL HIGH (ref 6–20)
CHLORIDE: 88 mmol/L — AB (ref 101–111)
CO2: 23 mmol/L (ref 22–32)
CREATININE: 2.5 mg/dL — AB (ref 0.44–1.00)
Calcium: 7.7 mg/dL — ABNORMAL LOW (ref 8.9–10.3)
GFR calc non Af Amer: 16 mL/min — ABNORMAL LOW (ref >60)
GFR, EST AFRICAN AMERICAN: 19 mL/min — AB (ref >60)
Glucose, Bld: 151 mg/dL — ABNORMAL HIGH (ref 65–99)
POTASSIUM: 5.6 mmol/L — AB (ref 3.5–5.1)
Sodium: 119 mmol/L — CL (ref 135–145)

## 2017-05-03 LAB — I-STAT CG4 LACTIC ACID, ED: Lactic Acid, Venous: 1.79 mmol/L (ref 0.5–1.9)

## 2017-05-03 LAB — I-STAT ARTERIAL BLOOD GAS, ED
ACID-BASE EXCESS: 1 mmol/L (ref 0.0–2.0)
Bicarbonate: 27.1 mmol/L (ref 20.0–28.0)
O2 Saturation: 97 %
PH ART: 7.373 (ref 7.350–7.450)
PO2 ART: 99 mmHg (ref 83.0–108.0)
Patient temperature: 98.6
TCO2: 28 mmol/L (ref 22–32)
pCO2 arterial: 46.6 mmHg (ref 32.0–48.0)

## 2017-05-03 LAB — CBC
HEMATOCRIT: 26.9 % — AB (ref 36.0–46.0)
HEMOGLOBIN: 8.7 g/dL — AB (ref 12.0–15.0)
MCH: 27.9 pg (ref 26.0–34.0)
MCHC: 32.3 g/dL (ref 30.0–36.0)
MCV: 86.2 fL (ref 78.0–100.0)
Platelets: 193 10*3/uL (ref 150–400)
RBC: 3.12 MIL/uL — ABNORMAL LOW (ref 3.87–5.11)
RDW: 14 % (ref 11.5–15.5)
WBC: 21.9 10*3/uL — AB (ref 4.0–10.5)

## 2017-05-03 LAB — I-STAT CHEM 8, ED
BUN: 57 mg/dL — ABNORMAL HIGH (ref 6–20)
CALCIUM ION: 1 mmol/L — AB (ref 1.15–1.40)
CHLORIDE: 81 mmol/L — AB (ref 101–111)
Creatinine, Ser: 2.7 mg/dL — ABNORMAL HIGH (ref 0.44–1.00)
Glucose, Bld: 145 mg/dL — ABNORMAL HIGH (ref 65–99)
HEMATOCRIT: 27 % — AB (ref 36.0–46.0)
Hemoglobin: 9.2 g/dL — ABNORMAL LOW (ref 12.0–15.0)
POTASSIUM: 6.3 mmol/L — AB (ref 3.5–5.1)
SODIUM: 118 mmol/L — AB (ref 135–145)
TCO2: 30 mmol/L (ref 22–32)

## 2017-05-03 LAB — CBC WITH DIFFERENTIAL/PLATELET
BASOS ABS: 0 10*3/uL (ref 0.0–0.1)
BASOS PCT: 0 %
EOS ABS: 0 10*3/uL (ref 0.0–0.7)
EOS PCT: 0 %
HCT: 26.7 % — ABNORMAL LOW (ref 36.0–46.0)
Hemoglobin: 8.6 g/dL — ABNORMAL LOW (ref 12.0–15.0)
Lymphocytes Relative: 2 %
Lymphs Abs: 0.3 10*3/uL — ABNORMAL LOW (ref 0.7–4.0)
MCH: 27.6 pg (ref 26.0–34.0)
MCHC: 32.2 g/dL (ref 30.0–36.0)
MCV: 85.6 fL (ref 78.0–100.0)
Monocytes Absolute: 0.6 10*3/uL (ref 0.1–1.0)
Monocytes Relative: 4 %
Neutro Abs: 14.7 10*3/uL — ABNORMAL HIGH (ref 1.7–7.7)
Neutrophils Relative %: 94 %
PLATELETS: 187 10*3/uL (ref 150–400)
RBC: 3.12 MIL/uL — AB (ref 3.87–5.11)
RDW: 14 % (ref 11.5–15.5)
WBC: 15.7 10*3/uL — AB (ref 4.0–10.5)

## 2017-05-03 LAB — APTT: aPTT: 33 seconds (ref 24–36)

## 2017-05-03 LAB — PROTIME-INR
INR: 1.31
Prothrombin Time: 16.1 seconds — ABNORMAL HIGH (ref 11.4–15.2)

## 2017-05-03 LAB — PROCALCITONIN: PROCALCITONIN: 5.79 ng/mL

## 2017-05-03 LAB — LACTIC ACID, PLASMA: LACTIC ACID, VENOUS: 1.3 mmol/L (ref 0.5–1.9)

## 2017-05-03 LAB — TROPONIN I: Troponin I: 0.54 ng/mL (ref <0.03)

## 2017-05-03 MED ORDER — DEXTROSE 5 % IV SOLN: 2.0000 g | Freq: Once | INTRAVENOUS | Status: DC

## 2017-05-03 MED ORDER — ONDANSETRON HCL 4 MG/2ML IJ SOLN: 4.0000 mg | Freq: Four times a day (QID) | INTRAMUSCULAR | Status: DC | PRN

## 2017-05-03 MED ORDER — DEXTROSE 5 % IV SOLN: 1.0000 g | Freq: Once | INTRAVENOUS | Status: DC

## 2017-05-03 MED ORDER — CALCITONIN (SALMON) 200 UNIT/ACT NA SOLN
1.0000 | Freq: Every day | NASAL | Status: DC
  Administered 2017-05-05 – 2017-05-07 (×2): 1 via NASAL
  Filled 2017-05-03: qty 3.7

## 2017-05-03 MED ORDER — HEPARIN SODIUM (PORCINE) 5000 UNIT/ML IJ SOLN
5000.0000 [IU] | Freq: Three times a day (TID) | INTRAMUSCULAR | Status: DC
  Administered 2017-05-03 – 2017-05-07 (×11): 5000 [IU] via SUBCUTANEOUS
  Filled 2017-05-03 (×11): qty 1

## 2017-05-03 MED ORDER — DEXTROSE 5 % IV SOLN
1.0000 g | Freq: Three times a day (TID) | INTRAVENOUS | Status: DC
  Administered 2017-05-04: 1 g via INTRAVENOUS
  Filled 2017-05-03 (×2): qty 1

## 2017-05-03 MED ORDER — DEXTROSE 5 % IV SOLN
2.0000 g | Freq: Once | INTRAVENOUS | Status: AC
  Administered 2017-05-03: 2 g via INTRAVENOUS
  Filled 2017-05-03: qty 2

## 2017-05-03 MED ORDER — INSULIN ASPART 100 UNIT/ML IV SOLN
5.0000 [IU] | Freq: Once | INTRAVENOUS | Status: AC
  Administered 2017-05-03: 5 [IU] via INTRAVENOUS
  Filled 2017-05-03: qty 0.05

## 2017-05-03 MED ORDER — SODIUM CHLORIDE 0.9 % IV SOLN
1.0000 g | Freq: Once | INTRAVENOUS | Status: AC
  Administered 2017-05-03: 1 g via INTRAVENOUS
  Filled 2017-05-03: qty 10

## 2017-05-03 MED ORDER — ASPIRIN EC 81 MG PO TBEC
81.0000 mg | DELAYED_RELEASE_TABLET | Freq: Every day | ORAL | Status: DC
  Administered 2017-05-03 – 2017-05-05 (×3): 81 mg via ORAL
  Filled 2017-05-03 (×3): qty 1

## 2017-05-03 MED ORDER — VANCOMYCIN HCL 10 G IV SOLR
1500.0000 mg | Freq: Once | INTRAVENOUS | Status: AC
  Administered 2017-05-03: 1500 mg via INTRAVENOUS
  Filled 2017-05-03: qty 1500

## 2017-05-03 MED ORDER — DEXTROSE 50 % IV SOLN
1.0000 | Freq: Once | INTRAVENOUS | Status: AC
  Administered 2017-05-03: 50 mL via INTRAVENOUS
  Filled 2017-05-03: qty 50

## 2017-05-03 MED ORDER — ALBUTEROL SULFATE (2.5 MG/3ML) 0.083% IN NEBU
10.0000 mg | INHALATION_SOLUTION | Freq: Once | RESPIRATORY_TRACT | Status: AC
  Administered 2017-05-03: 10 mg via RESPIRATORY_TRACT
  Filled 2017-05-03: qty 12

## 2017-05-03 MED ORDER — SODIUM CHLORIDE 0.9 % IV SOLN
1.0000 g | Freq: Once | INTRAVENOUS | Status: DC
  Filled 2017-05-03: qty 10

## 2017-05-03 MED ORDER — SODIUM CHLORIDE 0.9 % IV BOLUS (SEPSIS)
2500.0000 mL | Freq: Once | INTRAVENOUS | Status: AC
  Administered 2017-05-03: 2500 mL via INTRAVENOUS

## 2017-05-03 MED ORDER — SODIUM CHLORIDE 0.9 % IV SOLN
INTRAVENOUS | Status: AC
  Administered 2017-05-03: 22:00:00 via INTRAVENOUS

## 2017-05-03 MED ORDER — LEVOTHYROXINE SODIUM 25 MCG PO TABS
25.0000 ug | ORAL_TABLET | Freq: Every day | ORAL | Status: DC
  Administered 2017-05-05: 25 ug via ORAL
  Filled 2017-05-03 (×2): qty 1

## 2017-05-03 MED ORDER — OXYCODONE HCL 5 MG PO TABS
5.0000 mg | ORAL_TABLET | ORAL | Status: DC | PRN
Start: 2017-05-03 — End: 2017-05-07
  Administered 2017-05-03 – 2017-05-05 (×5): 5 mg via ORAL
  Filled 2017-05-03 (×5): qty 1

## 2017-05-03 MED ORDER — ONDANSETRON HCL 4 MG PO TABS: 4.0000 mg | ORAL_TABLET | Freq: Four times a day (QID) | ORAL | Status: DC | PRN

## 2017-05-03 MED ORDER — VANCOMYCIN HCL IN DEXTROSE 1-5 GM/200ML-% IV SOLN: 1000.0000 mg | INTRAVENOUS | Status: DC

## 2017-05-03 MED ORDER — SODIUM CHLORIDE 0.9 % IV SOLN
2000.0000 mg | Freq: Once | INTRAVENOUS | Status: DC
  Filled 2017-05-03: qty 2000

## 2017-05-03 MED ORDER — VANCOMYCIN HCL IN DEXTROSE 1-5 GM/200ML-% IV SOLN: 1000.0000 mg | Freq: Once | INTRAVENOUS | Status: DC

## 2017-05-03 NOTE — ED Triage Notes (Signed)
Per eMS:  Pt from [facility] where she is in rehab from a fall recently with a left broken arm.  Pt discharged a few days ago from here.  Pt lethargic during transport, sometimes combative.  Pt given 125 solumedrol 10 albuterol and 0.5 of atrovent en route.

## 2017-05-03 NOTE — Progress Notes (Signed)
Pharmacy Antibiotic Note  Kimberly [name] is a 83 y.o. female admitted on 05/14/2017 with sepsis.  Pharmacy has been consulted for Vancomycin/Aztreonam dosing. WBC 15.7,  Temp 102.6, Scr 2.63- Baseline~0.9-1.2. Noted allergy to Cephalexin(burning of skin/rash), and Penicillin (Rash). No history of cephalosporins or beta lactams being given in Epic.   Plan: Vancomycin loading dose of 1500mg  IV X1. Then, Vancomycin 1000mg  IV every 48 hours.  Goal trough 15-20 mcg/mL.   Aztreonam 1gm  IV every 8 hours. Patient already received 2g IV X1.  Monitor for Scr, c/s, clinical resolution. F/u de-escalation plan/LOT, vancomycin trough as indicated      Temp (24hrs), Avg:102.6 F (39.2 C), Min:102.6 F (39.2 C), Max:102.6 F (39.2 C)   Recent Labs Lab 04/27/17 1612 04/28/17 0617  04/29/17 1458 04/30/17 0347 05/01/17 0530 05/11/2017 1616 05/12/2017 1645  WBC 7.0 5.1  --   --   --  7.6 15.7*  --   CREATININE 1.11* 1.15*  < > 1.06* 1.05* 1.07* 2.63* 2.70*  LATICACIDVEN  --   --   --   --   --   --   --  1.79  < > = values in this interval not displayed.  Estimated Creatinine Clearance: 14.5 mL/min (A) (by C-G formula based on SCr of 2.7 mg/dL (H)).    Allergies  Allergen Reactions  . Ciprofloxacin Other (See Comments)    REACTION: burning of mouth and skin  . Lisinopril Cough  . Remeron [Mirtazapine] Other (See Comments)    constipation  . Sulfonamide Derivatives Other (See Comments)    REACTION: malaise  . Tetanus Toxoid Swelling  . Vesicare [Solifenacin Succinate] Other (See Comments)    Blurred vision and weakness  . Zoloft [Sertraline Hcl] Nausea And Vomiting  . Cephalexin Rash and Other (See Comments)    REACTION: rash and burning sensation to skin on arms and legs with itching.  Madelin Headings [Amitriptyline] Palpitations  . Penicillins Rash and Other (See Comments)    Has patient had a PCN reaction causing immediate rash, facial/tongue/throat swelling, SOB or lightheadedness with  hypotension: Unknown Has patient had a PCN reaction causing severe rash involving mucus membranes or skin necrosis: Unknown Has patient had a PCN reaction that required hospitalization: Unknown Has patient had a PCN reaction occurring within the last 10 years: No If all of the above answers are "NO", then may proceed with Cephalosporin use. Has since take Augmentin with no problem    Antimicrobials this admission: 10/1 Vancomycin >> 1500mg  IV X1 Maintenance Vancomycin: 1000mg  IV every 48 hours 10/1 Aztreonam >> 2g IV X1 Maintenance Aztreonam: 1g IV every 8 hours   Thank you for allowing pharmacy to be a part of this patient's care.  Jerrye Noble, PharmD Candidate 05/19/2017 8:03 PM

## 2017-05-03 NOTE — ED Notes (Signed)
Chem 8-reported to Dr. Tyrone Nine

## 2017-05-03 NOTE — H&P (Signed)
History and Physical    MAKENA MURDOCK NWG:[id number] DOB: 10/27/1927 DOA: 05/08/2017  PCP: Abner Greenspan, MD  Patient coming from:  rehab  Chief Complaint:   hypoxic  HPI: Kimberly [name] is a 83 y.o. female with medical history significant of chf, chronic hyponatremia, htn, cad just d/c from [place] 2 days ago to rehab brought in by ems from rehab center for oxygen sats 65% on RA earlier.  Pt chronically confused without formal dx of dementia but worse today per dtr.  All history obtained from daughter.  No n/vd.  Not been eating or drinking well.  No cough.  No mention of pain.  Temp was over 102 on arrival.  Had to be put on bipap due to altered mental status.  Improving overall in ED and almost returned to baseline.  Pt septic and referred for admission for such.     Review of Systems: As per HPI otherwise 10 point review of systems negative per dtr  Past Medical History:  Diagnosis Date  . Allergy    allergic rhinitis  . Chronic combined systolic and diastolic CHF (congestive heart failure) ([place])    a. 01/2014 Echo: EF 50-55%, no rwma, Gr1 DD, triv AI, mild MR;  b. 01/2017 Echo: EF 35%, diff HK, gr2  DD, fibrocalcific AoV w/o AS, mild AI/MR, sev dil LA, PASP 58mmHg.  Marland Kitchen GERD (gastroesophageal reflux disease)   . Hyperlipidemia   . Hypertension   . Hypothyroid   . Insomnia   . Non-obstructive CAD (coronary artery disease)    a. Non-obstructive dzs by caths in 2001 and 2006;  b. 03/2017 Cath: LM nl, LAD 30p, LCX nl, RCA 54m/d, EF 45-50%.    Past Surgical History:  Procedure Laterality Date  . [place] CATH AND CORONARY ANGIOGRAPHY N/A 03/03/2017   Procedure: Left Heart Cath and Coronary Angiography;  Surgeon: Minna Merritts, MD;  Location: [facility];  Service: Cardiovascular;  Laterality: N/A;  . OOPHORECTOMY    . THYROID SURGERY       reports that she has never smoked. She has never used smokeless  tobacco. She reports that she does not drink alcohol or use drugs.  Allergies  Allergen Reactions  . Ciprofloxacin Other (See Comments)    REACTION: burning of mouth and skin  . Lisinopril Cough  . Remeron [Mirtazapine] Other (See Comments)    constipation  . Sulfonamide Derivatives Other (See Comments)    REACTION: malaise  . Tetanus Toxoid Swelling  . Vesicare [Solifenacin Succinate] Other (See Comments)    Blurred vision and weakness  . Zoloft [Sertraline Hcl] Nausea And Vomiting  . Cephalexin Rash and Other (See Comments)    REACTION: rash and burning sensation to skin on arms and legs with itching.  Madelin Headings [Amitriptyline] Palpitations  . Penicillins Rash and Other (See Comments)    Has patient had a PCN reaction causing immediate rash, facial/tongue/throat swelling, SOB or lightheadedness with hypotension: Unknown Has patient had a PCN reaction causing severe rash involving mucus membranes or skin necrosis: Unknown Has patient had a PCN reaction that required hospitalization: Unknown Has patient had a PCN reaction occurring within the last 10 years: No If all of the above answers are "NO", then may proceed with Cephalosporin use. Has since take Augmentin with no problem    Family History  Problem Relation Age of Onset  .  Heart disease Mother   . Hypertension Mother     Prior to Admission medications   Medication Sig Start Date End Date Taking? Authorizing Provider  aspirin EC 81 MG tablet Take 81 mg by mouth daily.   Yes [provider]  calcitonin, salmon, (MIACALCIN/FORTICAL) 200 UNIT/ACT nasal spray Place 1 spray into alternate nostrils daily.   Yes [provider]  carvedilol (COREG) 3.125 MG tablet Take 1 tablet (3.125 mg total) by mouth 2 (two) times daily with a meal. Please keep 05/20/17 appt for future refills 04/27/17  Yes Gollan, Kathlene November, MD  cetirizine (ZYRTEC) 10 MG tablet Take 10 mg by mouth daily as needed for allergies.    Yes [provider]  docusate sodium 100 MG CAPS Take 100 mg by mouth 2 (two) times daily. For constipation 02/02/14  Yes Rai, Ripudeep K, MD  esomeprazole (NEXIUM) 40 MG capsule TAKE 1 CAPSULE (40 MG TOTAL) BY MOUTH DAILY. 10/12/16  Yes Tower, Wynelle Fanny, MD  fluticasone (FLONASE) 50 MCG/ACT nasal spray Place 2 sprays into both nostrils daily. 04/15/17  Yes Ria Bush, MD  furosemide (LASIX) 40 MG tablet Take 1 tablet (40 mg total) by mouth 2 (two) times daily. Patient taking differently: Take 40 mg by mouth See admin instructions. Take 1 tablet (40 mg) by mouth twice daily - at 8:30am and 1:30pm 05/01/17  Yes Sudini, Alveta Heimlich, MD  guaiFENesin (MUCINEX) 600 MG 12 hr tablet Take 600 mg by mouth 2 (two) times daily as needed for cough or to loosen phlegm.    Yes [provider]  levothyroxine (SYNTHROID, LEVOTHROID) 25 MCG tablet Take 1 tablet (25 mcg total) by mouth daily. Patient taking differently: Take 25 mcg by mouth daily before breakfast.  06/16/16  Yes Tower, Wynelle Fanny, MD  losartan (COZAAR) 25 MG tablet TAKE 1 TABLET BY MOUTH EVERY DAY Patient taking differently: TAKE 1 TABLET (25 MG) BY MOUTH EVERY DAY 03/25/17  Yes Gollan, Kathlene November, MD  Multiple Vitamin (MULTIVITAMIN WITH MINERALS) TABS tablet Take 1 tablet by mouth daily.   Yes [provider]  oxyCODONE (OXY IR/ROXICODONE) 5 MG immediate release tablet Take 1 tablet (5 mg total) by mouth every 6 (six) hours as needed for moderate pain or severe pain. Patient taking differently: Take 5 mg by mouth every 4 (four) hours as needed for severe pain.  05/01/17  Yes Sudini, Alveta Heimlich, MD  potassium chloride (K-DUR) 10 MEQ tablet Take 1 tablet (10 mEq total) by mouth daily. 05/01/17  Yes Sudini, Alveta Heimlich, MD  zolpidem (AMBIEN) 10 MG tablet TAKE 1/2 TO 1 TABLET BY MOUTH DAILY AT BEDTIME AS NEEDED Patient taking differently: TAKE 1/2 TO 1 TABLET (5-10 MG) BY MOUTH DAILY AT BEDTIME AS NEEDED FOR SLEEP 02/18/17  Yes Abner Greenspan, MD    Physical  Exam: Vitals:   05/08/2017 4268 06/01/2017 1749 05/19/2017 1830 05/30/2017 1845  BP: (!) 120/49  (!) 118/51 (!) 121/56  Pulse: 77  77 72  Resp: 15  16 19   Temp:      TempSrc:      SpO2: 98% 98% 98% 97%      Constitutional: NAD, calm, comfortable Vitals:   05/18/2017 1745 05/14/2017 1749 05/06/2017 1830 05/06/2017 1845  BP: (!) 120/49  (!) 118/51 (!) 121/56  Pulse: 77  77 72  Resp: 15  16 19   Temp:      TempSrc:      SpO2: 98% 98% 98% 97%   Eyes: PERRL, lids and conjunctivae  normal ENMT: Mucous membranes are moist. Posterior pharynx clear of any exudate or lesions.Normal dentition.  Neck: normal, supple, no masses, no thyromegaly Respiratory: clear to auscultation bilaterally, no wheezing, no crackles. Normal respiratory effort. No accessory muscle use.  Cardiovascular: Regular rate and rhythm, no murmurs / rubs / gallops. No extremity edema. 2+ pedal pulses. No carotid bruits.  Abdomen: no tenderness, no masses palpated. No hepatosplenomegaly. Bowel sounds positive.  Musculoskeletal: no clubbing / cyanosis. No joint deformity upper and lower extremities x left arm in sling from recent injury. Good ROM, no contractures. Normal muscle tone.  Skin: no rashes, lesions, ulcers. No induration Neurologic: CN 2-12 grossly intact. Sensation intact, DTR normal. Strength 5/5 in all 4.  Psychiatric: pleasantely confused no agitation  Labs on Admission: I have personally reviewed following labs and imaging studies  CBC:  Recent Labs Lab 04/27/17 1612 04/28/17 0617 05/01/17 0530 05/19/2017 1616 05/10/2017 1645  WBC 7.0 5.1 7.6 15.7*  --   NEUTROABS 5.5  --  6.4 14.7*  --   HGB 9.5* 8.8* 9.5* 8.6* 9.2*  HCT 27.0* 25.3* 28.3* 26.7* 27.0*  MCV 84.9 83.7 87.6 85.6  --   PLT 160 149* 173 187  --    Basic Metabolic Panel:  Recent Labs Lab 04/28/17 0954  04/29/17 1458 04/30/17 0347 05/01/17 0530 05/23/2017 1616 05/30/2017 1645  NA 120*  < > 123* 126* 126* 119* 118*  K 3.4*  --  4.1 4.5 4.7 6.0*  6.3*  CL 86*  --  88* 92* 89* 83* 81*  CO2 26  --  26 28 28 25   --   GLUCOSE 125*  --  154* 110* 126* 148* 145*  BUN 16  --  15 16 18  44* 57*  CREATININE 0.97  --  1.06* 1.05* 1.07* 2.63* 2.70*  CALCIUM 7.6*  --  7.8* 8.0* 8.1* 7.8*  --   < > = values in this interval not displayed. GFR: Estimated Creatinine Clearance: 14.5 mL/min (A) (by C-G formula based on SCr of 2.7 mg/dL (H)). Liver Function Tests:  Recent Labs Lab 04/27/17 1612 05/05/2017 1616  AST 17 19  ALT 12* 10*  ALKPHOS 84 56  BILITOT 0.4 1.1  PROT 5.9* 5.5*  ALBUMIN 3.3* 2.6*    Cardiac Enzymes:  Recent Labs Lab 04/27/17 1612  TROPONINI 0.04*   BNP (last 3 results)  Urine analysis:    Component Value Date/Time   COLORURINE YELLOW (A) 04/27/2017 1612   APPEARANCEUR CLEAR (A) 04/27/2017 1612   LABSPEC 1.008 04/27/2017 1612   PHURINE 5.0 04/27/2017 1612   GLUCOSEU NEGATIVE 04/27/2017 1612   HGBUR NEGATIVE 04/27/2017 1612   HGBUR trace-intact 06/25/2010 1028   BILIRUBINUR NEGATIVE 04/27/2017 1612   BILIRUBINUR Negative 12/07/2016 1535   KETONESUR NEGATIVE 04/27/2017 1612   PROTEINUR 30 (A) 04/27/2017 1612   UROBILINOGEN 0.2 12/07/2016 1535   UROBILINOGEN 0.2 06/25/2010 1028   NITRITE NEGATIVE 04/27/2017 1612   LEUKOCYTESUR NEGATIVE 04/27/2017 1612   Radiological Exams on Admission: Dg Chest Port 1 View  Result Date: 05/13/2017 CLINICAL DATA:  Severe dyspnea EXAM: PORTABLE CHEST 1 VIEW COMPARISON:  04/30/2017 chest radiograph. FINDINGS: Low lung volumes. Stable cardiomediastinal silhouette with mild cardiomegaly and aortic atherosclerosis. No pneumothorax. Small right pleural effusion appears new. No left pleural effusion. Hazy and linear parahilar lung opacities, asymmetric to the right, favor mild pulmonary edema. Mild bibasilar atelectasis. IMPRESSION: 1. Stable cardiomegaly with hazy and linear parahilar lung opacities asymmetric to the right, favor mild pulmonary edema due  to mild congestive heart  failure. 2. Small right pleural effusion. 3. Low lung volumes with mild bibasilar atelectasis. Electronically Signed   By: Ilona Sorrel M.D.   On: 05/30/2017 17:14    EKG: Independently reviewed. LBBB old  Old chart reviewed Case discussed with ed provider   Assessment/Plan 83 yo female with multiple medical problems comes in with acute hypoxic respiratory failure found to have sepsis with unclear source along with AKI, hyperkalemia and hyponatremia  Principal Problem:   Sepsis ([place])- source unclear.  pcn and cephalosporin allergic.  Place on vanc/azactam.  Pan culture.  ua and uc pending.  Asked staff to do I/o cath to obtain sample.  Temp over 101.  Blood cx pending.  cxr neg.  Lactic nml.  30cc/kg ivf bolus given to pt in ed per sepsis protocol.  Active Problems:   Hyponatremia- ns ivf at this time, d/c na level from [place] was 123, low was 112.     Acute hypoxemic respiratory failure ([place])- improving on bipap, cont bipap. dnr.   Chronic systolic heart failure ([place])- EF 35%.  Hold all diuretics due to above issues   Anxiety- noted   Essential hypertension- noted   LBBB (left bundle branch block)- old, same   Frequent UTI- obtain ua, urine cx   Obesity- noted   Hyperkalemia- rrepeat stat bmp now,  Treated appropriately in ED.  Likely improved after ivf also On kcl pills at home, hold these     DVT prophylaxis:  Scd, heparin Code Status:   DNR confirmed with family at bedside Family Communication:  daughter Disposition Plan:  Per day team Consults called:  none Admission status:  admission   Oleta Gunnoe A MD [facility]  If 7PM-7AM, please contact night-coverage [email or web address] Password [email or web address]  05/24/2017, 7:40 PM

## 2017-05-03 NOTE — ED Provider Notes (Signed)
[facility] DEPT Provider Note   CSN: [id number] Arrival date & time: 05/08/2017  1553     History   Chief Complaint Chief Complaint  Patient presents with  . Shortness of Breath    HPI Kimberly [name] is a 83 y.o. female.  83 yo F with a chief complaint of shortness of breath. Patient was found to be increasingly confused and sleepy at the nursing home. Upon EMS arrival she was noted to be hypoxic with an O2 sat in the upper 70s on room air. Started on nonrebreather mask with some improvement. Was spontaneously agitated en route.  Nursing home denied any infectious symptoms. Old records were reviewed and the patient was recently in the hospital and discharged 2 days ago for a CHF exacerbation.   The history is provided by the patient, the nursing home, the EMS personnel and a relative.  Shortness of Breath  This is a new problem. The average episode lasts 1 day. The problem occurs continuously.The current episode started 3 to 5 hours ago. The problem has not changed since onset.Associated symptoms include a fever and cough. Pertinent negatives include no headaches, no rhinorrhea, no wheezing, no chest pain and no vomiting. She has tried nothing for the symptoms. The treatment provided no relief. She has had prior hospitalizations. She has had prior ED visits. Associated medical issues include heart failure.    Past Medical History:  Diagnosis Date  . Allergy    allergic rhinitis  . Chronic combined systolic and diastolic CHF (congestive heart failure) ([place])    a. 01/2014 Echo: EF 50-55%, no rwma, Gr1 DD, triv AI, mild MR;  b. 01/2017 Echo: EF 35%, diff HK, gr2  DD, fibrocalcific AoV w/o AS, mild AI/MR, sev dil LA, PASP 44mmHg.  Marland Kitchen GERD (gastroesophageal reflux disease)   . Hyperlipidemia   . Hypertension   . Hypothyroid   . Insomnia   . Non-obstructive CAD (coronary artery disease)    a. Non-obstructive dzs by caths in 2001 and 2006;  b. 03/2017 Cath: LM nl, LAD 30p, LCX nl, RCA  74m/d, EF 45-50%.    Patient Active Problem List   Diagnosis Date Noted  . Sepsis ([place]) 05/15/2017  . Hyperkalemia 05/19/2017  . Acute hypoxemic respiratory failure ([place]) 05/12/2017  . CHF (congestive heart failure) ([place]) 04/27/2017  . Shortness of breath 04/15/2017  . Chronic systolic heart failure ([place]) 03/11/2017  . AKI (acute kidney injury) ([place]) 03/02/2017  . Orthostatic hypotension 03/02/2017  . Frequent PVCs 03/02/2017  . Frequent urination 12/07/2016  . Low back pain 12/07/2016  . Fall 12/07/2016  . Thoracic compression fracture ([place]) 12/07/2016  . Estrogen deficiency 12/07/2016  . Lower extremity edema 06/19/2016  . Carotid atherosclerosis 04/29/2016  . Medicare annual wellness visit, initial 05/27/2015  . Routine general medical examination at a health care facility 05/27/2015  . Colon cancer screening 05/27/2015  . Hyposmolality and/or hyponatremia 05/28/2014  . Hypokalemia 05/28/2014  . Hyponatremia 01/31/2014  . Unspecified constipation 01/31/2014  . Left thyroid nodule 08/04/2012  . Obesity 12/04/2011  . Gastritis 12/04/2011  . Heart palpitations 09/01/2011  . Depression 05/26/2011  . Frequent UTI 12/30/2010  . Lower abdominal pain 11/27/2010  . COLONIC POLYPS 04/09/2010  . Anemia 04/09/2010  . HEMORRHOIDS 03/04/2010  . Anxiety 12/10/2009  . PERSONAL HX COLONIC POLYPS 06/19/2008  . IRRITABLE BOWEL SYNDROME 03/28/2008  . 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 GOITER 03/29/2007  . Hypothyroidism 03/29/2007  . Hyperlipidemia 03/29/2007  . Essential hypertension 03/29/2007  .  Coronary atherosclerosis 03/29/2007  . LBBB (left bundle branch block) 03/29/2007  . ALLERGIC RHINITIS 03/29/2007  . GERD 03/29/2007  . HYPERGLYCEMIA 03/29/2007    Past Surgical History:  Procedure Laterality Date  . [place] CATH AND CORONARY ANGIOGRAPHY N/A 03/03/2017   Procedure: Left Heart Cath and  Coronary Angiography;  Surgeon: Minna Merritts, MD;  Location: [facility];  Service: Cardiovascular;  Laterality: N/A;  . OOPHORECTOMY    . THYROID SURGERY      OB History    No data available       Home Medications    Prior to Admission medications   Medication Sig Start Date End Date Taking? Authorizing Provider  aspirin EC 81 MG tablet Take 81 mg by mouth daily.   Yes [provider]  calcitonin, salmon, (MIACALCIN/FORTICAL) 200 UNIT/ACT nasal spray Place 1 spray into alternate nostrils daily.   Yes [provider]  carvedilol (COREG) 3.125 MG tablet Take 1 tablet (3.125 mg total) by mouth 2 (two) times daily with a meal. Please keep 05/20/17 appt for future refills 04/27/17  Yes Gollan, Kathlene November, MD  cetirizine (ZYRTEC) 10 MG tablet Take 10 mg by mouth daily as needed for allergies.    Yes [provider]  docusate sodium 100 MG CAPS Take 100 mg by mouth 2 (two) times daily. For constipation 02/02/14  Yes Rai, Ripudeep K, MD  esomeprazole (NEXIUM) 40 MG capsule TAKE 1 CAPSULE (40 MG TOTAL) BY MOUTH DAILY. 10/12/16  Yes Tower, Wynelle Fanny, MD  fluticasone (FLONASE) 50 MCG/ACT nasal spray Place 2 sprays into both nostrils daily. 04/15/17  Yes Ria Bush, MD  furosemide (LASIX) 40 MG tablet Take 1 tablet (40 mg total) by mouth 2 (two) times daily. Patient taking differently: Take 40 mg by mouth See admin instructions. Take 1 tablet (40 mg) by mouth twice daily - at 8:30am and 1:30pm 05/01/17  Yes Sudini, Alveta Heimlich, MD  guaiFENesin (MUCINEX) 600 MG 12 hr tablet Take 600 mg by mouth 2 (two) times daily as needed for cough or to loosen phlegm.    Yes [provider]  levothyroxine (SYNTHROID, LEVOTHROID) 25 MCG tablet Take 1 tablet (25 mcg total) by mouth daily. Patient taking differently: Take 25 mcg by mouth daily before breakfast.  06/16/16  Yes Tower, Wynelle Fanny, MD  losartan (COZAAR) 25 MG tablet TAKE 1 TABLET BY MOUTH EVERY DAY Patient taking  differently: TAKE 1 TABLET (25 MG) BY MOUTH EVERY DAY 03/25/17  Yes Gollan, Kathlene November, MD  Multiple Vitamin (MULTIVITAMIN WITH MINERALS) TABS tablet Take 1 tablet by mouth daily.   Yes [provider]  oxyCODONE (OXY IR/ROXICODONE) 5 MG immediate release tablet Take 1 tablet (5 mg total) by mouth every 6 (six) hours as needed for moderate pain or severe pain. Patient taking differently: Take 5 mg by mouth every 4 (four) hours as needed for severe pain.  05/01/17  Yes Sudini, Alveta Heimlich, MD  potassium chloride (K-DUR) 10 MEQ tablet Take 1 tablet (10 mEq total) by mouth daily. 05/01/17  Yes Sudini, Alveta Heimlich, MD  zolpidem (AMBIEN) 10 MG tablet TAKE 1/2 TO 1 TABLET BY MOUTH DAILY AT BEDTIME AS NEEDED Patient taking differently: TAKE 1/2 TO 1 TABLET (5-10 MG) BY MOUTH DAILY AT BEDTIME AS NEEDED FOR SLEEP 02/18/17  Yes Tower, Wynelle Fanny, MD    Family History Family History  Problem Relation Age  of Onset  . Heart disease Mother   . Hypertension Mother     Social History Social History  Substance Use Topics  . Smoking status: Never Smoker  . Smokeless tobacco: Never Used  . Alcohol use No     Allergies   Ciprofloxacin; Lisinopril; Remeron [mirtazapine]; Sulfonamide derivatives; Tetanus toxoid; Vesicare [solifenacin succinate]; Zoloft [sertraline hcl]; Cephalexin; Elavil [amitriptyline]; and Penicillins   Review of Systems Review of Systems  Unable to perform ROS: Mental status change  Constitutional: Positive for fever. Negative for chills.  HENT: Negative for congestion and rhinorrhea.   Eyes: Negative for redness and visual disturbance.  Respiratory: Positive for cough. Negative for shortness of breath and wheezing.   Cardiovascular: Negative for chest pain and palpitations.  Gastrointestinal: Negative for nausea and vomiting.  Genitourinary: Negative for dysuria and urgency.  Musculoskeletal: Negative for arthralgias and myalgias.  Skin: Negative for pallor and wound.  Neurological:  Positive for weakness. Negative for dizziness and headaches.     Physical Exam Updated Vital Signs BP (!) 132/57   Pulse 81   Temp (!) 102.6 F (39.2 C) (Oral)   Resp 13   LMP 08/03/1978   SpO2 99%   Physical Exam  Constitutional: She appears well-developed and well-nourished. She appears lethargic. No distress.  Hot to touch  HENT:  Head: Normocephalic and atraumatic.  Eyes: Pupils are equal, round, and reactive to light. EOM are normal.  Neck: Normal range of motion. Neck supple.  Cardiovascular: Normal rate and regular rhythm.  Exam reveals no gallop and no friction rub.   No murmur heard. Pulmonary/Chest: Effort normal. She has no wheezes. She has no rales.  Abdominal: Soft. She exhibits no distension and no mass. There is no tenderness. There is no guarding.  Musculoskeletal: She exhibits no edema or tenderness.  Neurological: She appears lethargic. GCS eye subscore is 2. GCS verbal subscore is 4. GCS motor subscore is 5.  Skin: Skin is warm and dry. She is not diaphoretic.  Psychiatric: She has a normal mood and affect. Her behavior is normal.  Nursing note and vitals reviewed.    ED Treatments / Results  Labs (all labs ordered are listed, but only abnormal results are displayed) Labs Reviewed  COMPREHENSIVE METABOLIC PANEL - Abnormal; Notable for the following:       Result Value   Sodium 119 (*)    Potassium 6.0 (*)    Chloride 83 (*)    Glucose, Bld 148 (*)    BUN 44 (*)    Creatinine, Ser 2.63 (*)    Calcium 7.8 (*)    Total Protein 5.5 (*)    Albumin 2.6 (*)    ALT 10 (*)    GFR calc non Af Amer 15 (*)    GFR calc Af Amer 18 (*)    All other components within normal limits  CBC WITH DIFFERENTIAL/PLATELET - Abnormal; Notable for the following:    WBC 15.7 (*)    RBC 3.12 (*)    Hemoglobin 8.6 (*)    HCT 26.7 (*)    Neutro Abs 14.7 (*)    Lymphs Abs 0.3 (*)    All other components within normal limits  PROTIME-INR - Abnormal; Notable for the  following:    Prothrombin Time 16.1 (*)    All other components within normal limits  CBC - Abnormal; Notable for the following:    WBC 21.9 (*)    RBC 3.12 (*)    Hemoglobin 8.7 (*)  HCT 26.9 (*)    All other components within normal limits  TROPONIN I - Abnormal; Notable for the following:    Troponin I 0.54 (*)    All other components within normal limits  BASIC METABOLIC PANEL - Abnormal; Notable for the following:    Sodium 119 (*)    Potassium 5.6 (*)    Chloride 88 (*)    Glucose, Bld 151 (*)    BUN 44 (*)    Creatinine, Ser 2.50 (*)    Calcium 7.7 (*)    GFR calc non Af Amer 16 (*)    GFR calc Af Amer 19 (*)    All other components within normal limits  I-STAT CHEM 8, ED - Abnormal; Notable for the following:    Sodium 118 (*)    Potassium 6.3 (*)    Chloride 81 (*)    BUN 57 (*)    Creatinine, Ser 2.70 (*)    Glucose, Bld 145 (*)    Calcium, Ion 1.00 (*)    Hemoglobin 9.2 (*)    HCT 27.0 (*)    All other components within normal limits  CULTURE, BLOOD (ROUTINE X 2)  CULTURE, BLOOD (ROUTINE X 2)  URINE CULTURE  LACTIC ACID, PLASMA  PROCALCITONIN  APTT  URINALYSIS, ROUTINE W REFLEX MICROSCOPIC  LACTIC ACID, PLASMA  TROPONIN I  TROPONIN I  BASIC METABOLIC PANEL  CBC  I-STAT CG4 LACTIC ACID, ED  I-STAT ARTERIAL BLOOD GAS, ED    EKG  EKG Interpretation  Date/Time:  Monday May 03 2017 15:55:05 EDT Ventricular Rate:  81 PR Interval:    QRS Duration: 158 QT Interval:  402 QTC Calculation: 467 R Axis:   -54 Text Interpretation:  Sinus rhythm Prolonged PR interval Left bundle branch block No significant change since last tracing Confirmed by Deno Etienne [phone]) on 06/01/2017 4:32:37 PM       Radiology Dg Chest Port 1 View  Result Date: 05/30/2017 CLINICAL DATA:  Severe dyspnea EXAM: PORTABLE CHEST 1 VIEW COMPARISON:  04/30/2017 chest radiograph. FINDINGS: Low lung volumes. Stable cardiomediastinal silhouette with mild cardiomegaly and aortic  atherosclerosis. No pneumothorax. Small right pleural effusion appears new. No left pleural effusion. Hazy and linear parahilar lung opacities, asymmetric to the right, favor mild pulmonary edema. Mild bibasilar atelectasis. IMPRESSION: 1. Stable cardiomegaly with hazy and linear parahilar lung opacities asymmetric to the right, favor mild pulmonary edema due to mild congestive heart failure. 2. Small right pleural effusion. 3. Low lung volumes with mild bibasilar atelectasis. Electronically Signed   By: Ilona Sorrel M.D.   On: 05/15/2017 17:14    Procedures Procedures (including critical care time)  Medications Ordered in ED Medications  aspirin EC tablet 81 mg (81 mg Oral Given 05/24/2017 2209)  calcitonin (salmon) (MIACALCIN/FORTICAL) nasal spray 1 spray (not administered)  oxyCODONE (Oxy IR/ROXICODONE) immediate release tablet 5 mg (5 mg Oral Given 05/12/2017 2005)  levothyroxine (SYNTHROID, LEVOTHROID) tablet 25 mcg (not administered)  heparin injection 5,000 Units (5,000 Units Subcutaneous Given 05/30/2017 2211)  0.9 %  sodium chloride infusion ( Intravenous New Bag/Given 05/14/2017 2214)  ondansetron (ZOFRAN) tablet 4 mg (not administered)    Or  ondansetron (ZOFRAN) injection 4 mg (not administered)  vancomycin (VANCOCIN) 1,500 mg in sodium chloride 0.9 % 500 mL IVPB (1,500 mg Intravenous New Bag/Given 05/11/2017 2213)  vancomycin (VANCOCIN) IVPB 1000 mg/200 mL premix (not administered)  aztreonam (AZACTAM) 1 g in dextrose 5 % 50 mL IVPB (not administered)  sodium chloride 0.9 %  bolus 2,500 mL (0 mLs Intravenous Stopped 05/18/2017 2001)  aztreonam (AZACTAM) 2 g in dextrose 5 % 50 mL IVPB (0 g Intravenous Stopped 05/30/2017 1721)  albuterol (PROVENTIL) (2.5 MG/3ML) 0.083% nebulizer solution 10 mg (10 mg Nebulization Given 05/31/2017 1749)  insulin aspart (novoLOG) injection 5 Units (5 Units Intravenous Given 05/31/2017 1736)  dextrose 50 % solution 50 mL (50 mLs Intravenous Given 05/31/2017 1731)  calcium  gluconate 1 g in sodium chloride 0.9 % 100 mL IVPB (0 g Intravenous Stopped 05/29/2017 1815)     Initial Impression / Assessment and Plan / ED Course  I have reviewed the triage vital signs and the nursing notes.  Pertinent labs & imaging results that were available during my care of the patient were reviewed by me and considered in my medical decision making (see chart for details).     38 y oF with a chief complaint of decreased level of consciousness. Patient was started on BiPAP with significant improvement of her symptoms. She was hypertensive on arrival and sepsis was initiated she was given 30 mL/kg of IV fluids with improvement of her mental status and blood pressure.Chest x-ray with no overt pneumonia. She has coughed a few times. She was started on broad-spectrum antibiotics. She was noted to have acute kidney injury with hyperkalemia. EKG had peak T waves. She was temporized in the ED.   Sepsis - Repeat Assessment  Performed at:    1745  Vitals     Blood pressure (!) 97/53, pulse 71, temperature (!) 102.6 F (39.2 C), temperature source Oral, resp. rate 15, last menstrual period 08/03/1978, SpO2 96 %.  Heart:     Regular rate and rhythm  Lungs:    CTA  Capillary Refill:   <2 sec  Peripheral Pulse:   Radial pulse palpable  Skin:     Normal Color     CRITICAL CARE Performed by: Cecilio Asper   Total critical care time: 80 minutes  Critical care time was exclusive of separately billable procedures and treating other patients.  Critical care was necessary to treat or prevent imminent or life-threatening deterioration.  Critical care was time spent personally by me on the following activities: development of treatment plan with patient and/or surrogate as well as nursing, discussions with consultants, evaluation of patient's response to treatment, examination of patient, obtaining history from patient or surrogate, ordering and performing treatments and  interventions, ordering and review of laboratory studies, ordering and review of radiographic studies, pulse oximetry and re-evaluation of patient's condition.  The patients results and plan were reviewed and discussed.   Any x-rays performed were independently reviewed by myself.   Differential diagnosis were considered with the presenting HPI.  Medications  aspirin EC tablet 81 mg (81 mg Oral Given 05/30/2017 2209)  calcitonin (salmon) (MIACALCIN/FORTICAL) nasal spray 1 spray (not administered)  oxyCODONE (Oxy IR/ROXICODONE) immediate release tablet 5 mg (5 mg Oral Given 05/22/2017 2005)  levothyroxine (SYNTHROID, LEVOTHROID) tablet 25 mcg (not administered)  heparin injection 5,000 Units (5,000 Units Subcutaneous Given 05/24/2017 2211)  0.9 %  sodium chloride infusion ( Intravenous New Bag/Given 05/16/2017 2214)  ondansetron (ZOFRAN) tablet 4 mg (not administered)    Or  ondansetron (ZOFRAN) injection 4 mg (not administered)  vancomycin (VANCOCIN) 1,500 mg in sodium chloride 0.9 % 500 mL IVPB (1,500 mg Intravenous New Bag/Given 05/15/2017 2213)  vancomycin (VANCOCIN) IVPB 1000 mg/200 mL premix (not administered)  aztreonam (AZACTAM) 1 g in dextrose 5 % 50 mL IVPB (not administered)  sodium chloride 0.9 % bolus 2,500 mL (0 mLs Intravenous Stopped 05/28/2017 2001)  aztreonam (AZACTAM) 2 g in dextrose 5 % 50 mL IVPB (0 g Intravenous Stopped 05/23/2017 1721)  albuterol (PROVENTIL) (2.5 MG/3ML) 0.083% nebulizer solution 10 mg (10 mg Nebulization Given 05/17/2017 1749)  insulin aspart (novoLOG) injection 5 Units (5 Units Intravenous Given 05/18/2017 1736)  dextrose 50 % solution 50 mL (50 mLs Intravenous Given 05/06/2017 1731)  calcium gluconate 1 g in sodium chloride 0.9 % 100 mL IVPB (0 g Intravenous Stopped 05/16/2017 1815)    Vitals:   05/15/2017 2145 05/06/2017 2230 05/30/2017 2245 05/11/2017 2300  BP: (!) 132/59 (!) 133/57 134/64 (!) 132/57  Pulse: 73 74 75 81  Resp: 16 14 15 13   Temp:      TempSrc:      SpO2: 99% 98%  98% 99%    Final diagnoses:  Septic shock ([place])    Admission/ observation were discussed with the admitting physician, patient and/or family and they are comfortable with the plan.    Final Clinical Impressions(s) / ED Diagnoses   Final diagnoses:  Septic shock [facility])    New Prescriptions New Prescriptions   No medications on file     Deno Etienne, DO 05/14/2017 2332

## 2017-05-03 NOTE — ED Notes (Signed)
Respiratory made aware of need for breathing treatment

## 2017-05-03 NOTE — Telephone Encounter (Signed)
Received records request from [employer], forwarded to [facility] for processing.

## 2017-05-04 ENCOUNTER — Encounter (HOSPITAL_COMMUNITY): Payer: Self-pay | Admitting: General Practice

## 2017-05-04 DIAGNOSIS — A419 Sepsis, unspecified organism: Secondary | ICD-10-CM

## 2017-05-04 DIAGNOSIS — I5022 Chronic systolic (congestive) heart failure: Secondary | ICD-10-CM

## 2017-05-04 DIAGNOSIS — J9601 Acute respiratory failure with hypoxia: Secondary | ICD-10-CM

## 2017-05-04 DIAGNOSIS — I110 Hypertensive heart disease with heart failure: Secondary | ICD-10-CM

## 2017-05-04 DIAGNOSIS — S22071G Stable burst fracture of T9-T10 vertebra, subsequent encounter for fracture with delayed healing: Secondary | ICD-10-CM

## 2017-05-04 DIAGNOSIS — R6521 Severe sepsis with septic shock: Secondary | ICD-10-CM

## 2017-05-04 DIAGNOSIS — R7881 Bacteremia: Secondary | ICD-10-CM

## 2017-05-04 DIAGNOSIS — S42291A Other displaced fracture of upper end of right humerus, initial encounter for closed fracture: Secondary | ICD-10-CM

## 2017-05-04 LAB — BASIC METABOLIC PANEL
ANION GAP: 15 (ref 5–15)
BUN: 44 mg/dL — ABNORMAL HIGH (ref 6–20)
CALCIUM: 7.9 mg/dL — AB (ref 8.9–10.3)
CHLORIDE: 90 mmol/L — AB (ref 101–111)
CO2: 19 mmol/L — AB (ref 22–32)
CREATININE: 2.21 mg/dL — AB (ref 0.44–1.00)
GFR calc Af Amer: 22 mL/min — ABNORMAL LOW (ref >60)
GFR calc non Af Amer: 19 mL/min — ABNORMAL LOW (ref >60)
GLUCOSE: 133 mg/dL — AB (ref 65–99)
Potassium: 5.9 mmol/L — ABNORMAL HIGH (ref 3.5–5.1)
Sodium: 124 mmol/L — ABNORMAL LOW (ref 135–145)

## 2017-05-04 LAB — CBC
HCT: 29.6 % — ABNORMAL LOW (ref 36.0–46.0)
HEMOGLOBIN: 9.6 g/dL — AB (ref 12.0–15.0)
MCH: 28 pg (ref 26.0–34.0)
MCHC: 32.4 g/dL (ref 30.0–36.0)
MCV: 86.3 fL (ref 78.0–100.0)
Platelets: 175 10*3/uL (ref 150–400)
RBC: 3.43 MIL/uL — ABNORMAL LOW (ref 3.87–5.11)
RDW: 14.1 % (ref 11.5–15.5)
WBC: 20.6 10*3/uL — ABNORMAL HIGH (ref 4.0–10.5)

## 2017-05-04 LAB — TROPONIN I
TROPONIN I: 0.31 ng/mL — AB (ref <0.03)
Troponin I: 0.33 ng/mL (ref <0.03)

## 2017-05-04 LAB — BLOOD CULTURE ID PANEL (REFLEXED)
Acinetobacter baumannii: NOT DETECTED
CANDIDA ALBICANS: NOT DETECTED
CANDIDA GLABRATA: NOT DETECTED
CANDIDA KRUSEI: NOT DETECTED
CANDIDA TROPICALIS: NOT DETECTED
Candida parapsilosis: NOT DETECTED
ENTEROBACTER CLOACAE COMPLEX: NOT DETECTED
ENTEROCOCCUS SPECIES: NOT DETECTED
ESCHERICHIA COLI: NOT DETECTED
Enterobacteriaceae species: NOT DETECTED
Haemophilus influenzae: NOT DETECTED
KLEBSIELLA PNEUMONIAE: NOT DETECTED
Klebsiella oxytoca: NOT DETECTED
Listeria monocytogenes: NOT DETECTED
Methicillin resistance: DETECTED — AB
Neisseria meningitidis: NOT DETECTED
PROTEUS SPECIES: NOT DETECTED
Pseudomonas aeruginosa: NOT DETECTED
STAPHYLOCOCCUS SPECIES: DETECTED — AB
STREPTOCOCCUS AGALACTIAE: NOT DETECTED
STREPTOCOCCUS PYOGENES: NOT DETECTED
Serratia marcescens: NOT DETECTED
Staphylococcus aureus (BCID): DETECTED — AB
Streptococcus pneumoniae: NOT DETECTED
Streptococcus species: NOT DETECTED

## 2017-05-04 LAB — POTASSIUM: POTASSIUM: 5.5 mmol/L — AB (ref 3.5–5.1)

## 2017-05-04 LAB — URINALYSIS, ROUTINE W REFLEX MICROSCOPIC
BILIRUBIN URINE: NEGATIVE
Glucose, UA: NEGATIVE mg/dL
Hgb urine dipstick: NEGATIVE
KETONES UR: NEGATIVE mg/dL
NITRITE: NEGATIVE
PROTEIN: NEGATIVE mg/dL
Specific Gravity, Urine: 1.012 (ref 1.005–1.030)
pH: 6 (ref 5.0–8.0)

## 2017-05-04 LAB — LACTIC ACID, PLASMA: LACTIC ACID, VENOUS: 1.1 mmol/L (ref 0.5–1.9)

## 2017-05-04 LAB — MRSA PCR SCREENING: MRSA BY PCR: POSITIVE — AB

## 2017-05-04 MED ORDER — CHLORHEXIDINE GLUCONATE CLOTH 2 % EX PADS
6.0000 | MEDICATED_PAD | Freq: Every day | CUTANEOUS | Status: DC
  Administered 2017-05-05 – 2017-05-07 (×3): 6 via TOPICAL

## 2017-05-04 MED ORDER — SENNOSIDES-DOCUSATE SODIUM 8.6-50 MG PO TABS
1.0000 | ORAL_TABLET | Freq: Two times a day (BID) | ORAL | Status: DC
  Administered 2017-05-05: 1 via ORAL
  Filled 2017-05-04 (×3): qty 1

## 2017-05-04 MED ORDER — SODIUM CHLORIDE 0.9 % IV SOLN
1.0000 g | Freq: Once | INTRAVENOUS | Status: AC
  Administered 2017-05-04: 1 g via INTRAVENOUS
  Filled 2017-05-04: qty 10

## 2017-05-04 MED ORDER — SODIUM POLYSTYRENE SULFONATE 15 GM/60ML PO SUSP
30.0000 g | Freq: Once | ORAL | Status: AC
  Administered 2017-05-04: 30 g via ORAL
  Filled 2017-05-04: qty 120

## 2017-05-04 MED ORDER — OXYCODONE HCL 5 MG PO TABS
5.0000 mg | ORAL_TABLET | Freq: Once | ORAL | Status: AC
  Administered 2017-05-04: 5 mg via ORAL
  Filled 2017-05-04: qty 1

## 2017-05-04 MED ORDER — POLYETHYLENE GLYCOL 3350 17 G PO PACK: 17.0000 g | PACK | Freq: Every day | ORAL | Status: DC | PRN

## 2017-05-04 MED ORDER — MUPIROCIN 2 % EX OINT
1.0000 "application " | TOPICAL_OINTMENT | Freq: Two times a day (BID) | CUTANEOUS | Status: DC
  Administered 2017-05-04 – 2017-05-07 (×6): 1 via NASAL
  Filled 2017-05-04 (×2): qty 22

## 2017-05-04 MED ORDER — ORAL CARE MOUTH RINSE
15.0000 mL | Freq: Two times a day (BID) | OROMUCOSAL | Status: DC
  Administered 2017-05-04 – 2017-05-07 (×6): 15 mL via OROMUCOSAL

## 2017-05-04 NOTE — Progress Notes (Signed)
Came by room to check on pt.  Pt denies SOB, no increased WOB noted.  Pt states her breathing is good.  Family at bedside, notified pt and family that if pt feels SOB- notify RN/RT.

## 2017-05-04 NOTE — ED Notes (Signed)
Report attempted 

## 2017-05-04 NOTE — Care Management Note (Addendum)
Case Management Note  Patient Details  Name: Kimberly [name] MRN: [id number] Date of Birth: [birth year]  Subjective/Objective:  From [facility], presents with  MRSA Bacteremia, CHF, confusion, left humerus fx. Patient will benefit from pt/ot eval.    10/5 1244 Tomi Bamberger RN, BSN - Palliative to meet with family today.                Action/Plan:  NCM will follow for dc needs.   Expected Discharge Date:                  Expected Discharge Plan:  Home/Self Care  In-House Referral:     Discharge planning Services  CM Consult  Post Acute Care Choice:    Choice offered to:     DME Arranged:    DME Agency:     HH Arranged:    HH Agency:     Status of Service:  In process, will continue to follow  If discussed at Long Length of Stay Meetings, dates discussed:    Additional Comments:  Zenon Mayo, RN 05/04/2017, 4:50 PM

## 2017-05-04 NOTE — ED Notes (Signed)
Attempted to draw lactic off of both IVs without success.  CC, RN, notified.

## 2017-05-04 NOTE — Progress Notes (Signed)
PHARMACY - PHYSICIAN COMMUNICATION CRITICAL VALUE ALERT - BLOOD CULTURE IDENTIFICATION (BCID)  Results for orders placed or performed during the hospital encounter of 05/08/2017  Blood Culture ID Panel (Reflexed) (Collected: 05/13/2017  4:12 PM)  Result Value Ref Range   Enterococcus species NOT DETECTED NOT DETECTED   Listeria monocytogenes NOT DETECTED NOT DETECTED   Staphylococcus species DETECTED (A) NOT DETECTED   Staphylococcus aureus DETECTED (A) NOT DETECTED   Methicillin resistance DETECTED (A) NOT DETECTED   Streptococcus species NOT DETECTED NOT DETECTED   Streptococcus agalactiae NOT DETECTED NOT DETECTED   Streptococcus pneumoniae NOT DETECTED NOT DETECTED   Streptococcus pyogenes NOT DETECTED NOT DETECTED   Acinetobacter baumannii NOT DETECTED NOT DETECTED   Enterobacteriaceae species NOT DETECTED NOT DETECTED   Enterobacter cloacae complex NOT DETECTED NOT DETECTED   Escherichia coli NOT DETECTED NOT DETECTED   Klebsiella oxytoca NOT DETECTED NOT DETECTED   Klebsiella pneumoniae NOT DETECTED NOT DETECTED   Proteus species NOT DETECTED NOT DETECTED   Serratia marcescens NOT DETECTED NOT DETECTED   Haemophilus influenzae NOT DETECTED NOT DETECTED   Neisseria meningitidis NOT DETECTED NOT DETECTED   Pseudomonas aeruginosa NOT DETECTED NOT DETECTED   Candida albicans NOT DETECTED NOT DETECTED   Candida glabrata NOT DETECTED NOT DETECTED   Candida krusei NOT DETECTED NOT DETECTED   Candida parapsilosis NOT DETECTED NOT DETECTED   Candida tropicalis NOT DETECTED NOT DETECTED    Name of physician (or Provider) Contacted:  N/A  Changes to prescribed antibiotics required:   MRSA in 4/4 bottles.  Already receiving Vancomycin.   Consider d/c Aztreonam.    Caryl Pina 05/04/2017  6:53 AM

## 2017-05-04 NOTE — Consult Note (Signed)
[facility] for Infectious Disease    Date of Admission:  05/21/2017     Total days of antibiotics  2  Vancomycin 05/13/2017 -   Aztreonam 05/16/2017 - 05/04/17               Reason for Consult: MRSA Bacteremia    Referring Provider: CHAMP  Assessment: MRSA Bacteremia  CHF exacerbation  Confused baseline/impaired memory Left humerus Fx   Plan: 1. Stop aztreonam  2. Continue Vancomycin with close monitoring of renal function  3. Check TTE 4. May need to further image that left humerus - very swollen, bruised and tender with limited ROM  5. Repeat cultures in AM    Principal Problem:   Sepsis ([place]) Active Problems:   Anxiety   Essential hypertension   LBBB (left bundle branch block)   Frequent UTI   Obesity   Hyponatremia   Chronic systolic heart failure (HCC)   Hyperkalemia   Acute hypoxemic respiratory failure ([place])   . aspirin EC  81 mg Oral Daily  . calcitonin (salmon)  1 spray Alternating Nares Daily  . heparin  5,000 Units Subcutaneous Q8H  . levothyroxine  25 mcg Oral QAC breakfast    HPI: Kimberly [name] is a 83 y.o. female with a pmhx significant for CHF (LVEF 35%), NICM, chronic hyponatremia, HTN, CAD.   Kimberly [name] was transported from [facility] on the afternoon of 05/09/2017 with shortness of breath and altered mental status - she is apparently baseline confused but now combative at times but mostly increasingly confused and sleepy). She was found to be hypoxic with POx in upper 70s on room air; she was started on non-rebreather en route which progressed to BiPAP. Upon arrival to the ED she was found to be hypotensive (90s/50s) and febrile to 102.6 deg, WBC 21.9k. Sepsis protocol was initiated, hemodynamics and mental status improved after initial bolus of IV fluids. She was started on Aztreonam (rash to cephalexin?) and vancomycin.   BCx were obtained growing 2/2 for GPC in clusters with MRSA on BCID panel.   CXR showing cardiomegaly with right sided hazy  and linear parahilar lung opacities with small pleural effusion favoring pulmonary edema.   Of note she was just discharged from [facility] 2 days prior for CHF exacerbation. Prior to that required hospitalization in July for hypoxia/CHF exacerbation and in April 2018 sustained a fall and was evaluated for lumbar back pain - XR with mild wedge compression deformity of T11 vertebral body.   FU CT of her spine done 04/27/17 - T11 moderate/severe burst fracture with mild canal stenosis, L3-4 disc protrusion with moderate foraminal narrowing. She is not complaining of any back pain presently or in the last few days leading up to admission.   04/27/17 Left Shoulder Fx - displaced fx of proximal left humerus after fall at home (daughter reports non-displaced fx). Currently in a sling.  She was just taken off BiPAP. Breathing OK for now on 5L. Uncertain as to where she is or what day it is.   Review of Systems: Review of Systems  Unable to perform ROS: Dementia  Daughter Maudie Mercury is at bedside to help with history.   Past Medical History:  Diagnosis Date  . Allergy    allergic rhinitis  . Chronic combined systolic and diastolic CHF (congestive heart failure) ([place])    a. 01/2014 Echo: EF 50-55%, no rwma, Gr1 DD, triv AI, mild MR;  b. 01/2017 Echo: EF  35%, diff HK, gr2  DD, fibrocalcific AoV w/o AS, mild AI/MR, sev dil LA, PASP 69mmHg.  Marland Kitchen GERD (gastroesophageal reflux disease)   . Hyperlipidemia   . Hypertension   . Hypothyroid   . Insomnia   . Non-obstructive CAD (coronary artery disease)    a. Non-obstructive dzs by caths in 2001 and 2006;  b. 03/2017 Cath: LM nl, LAD 30p, LCX nl, RCA 55m/d, EF 45-50%.    Social History  Substance Use Topics  . Smoking status: Never Smoker  . Smokeless tobacco: Never Used  . Alcohol use No    Family History  Problem Relation Age of Onset  . Heart disease Mother   . Hypertension Mother    Allergies  Allergen Reactions  . Ciprofloxacin Other (See Comments)     REACTION: burning of mouth and skin  . Lisinopril Cough  . Remeron [Mirtazapine] Other (See Comments)    constipation  . Sulfonamide Derivatives Other (See Comments)    REACTION: malaise  . Tetanus Toxoid Swelling  . Vesicare [Solifenacin Succinate] Other (See Comments)    Blurred vision and weakness  . Zoloft [Sertraline Hcl] Nausea And Vomiting  . Cephalexin Rash and Other (See Comments)    REACTION: rash and burning sensation to skin on arms and legs with itching.  Madelin Headings [Amitriptyline] Palpitations  . Penicillins Rash and Other (See Comments)    Has patient had a PCN reaction causing immediate rash, facial/tongue/throat swelling, SOB or lightheadedness with hypotension: Unknown Has patient had a PCN reaction causing severe rash involving mucus membranes or skin necrosis: Unknown Has patient had a PCN reaction that required hospitalization: Unknown Has patient had a PCN reaction occurring within the last 10 years: No If all of the above answers are "NO", then may proceed with Cephalosporin use. Has since take Augmentin with no problem    OBJECTIVE: Blood pressure (!) 148/71, pulse 87, temperature (!) 102.6 F (39.2 C), temperature source Oral, resp. rate 20, last menstrual period 08/03/1978, SpO2 100 %.  Physical Exam  Constitutional:  Elderly woman seated upright on stretcher. Daughter at bedside.   Pulmonary/Chest: Effort normal.  Diffusely diminished. Recently off bipap. Minimal accessory use. Controlled rate.   Abdominal: Soft. Bowel sounds are normal. She exhibits no distension.  Musculoskeletal:       Left shoulder: She exhibits decreased range of motion, tenderness, swelling and pain. She exhibits normal pulse.       Arms: Neurological: She is alert.  Pleasant. Confused needing frequent reorientation.   Skin: Skin is warm and dry.  Feels warm.     Lab Results Lab Results  Component Value Date   WBC 20.6 (H) 05/04/2017   HGB 9.6 (L) 05/04/2017   HCT 29.6 (L)  05/04/2017   MCV 86.3 05/04/2017   PLT 175 05/04/2017    Lab Results  Component Value Date   CREATININE 2.50 (H) 05/11/2017   BUN 44 (H) 05/10/2017   NA 119 (LL) 05/25/2017   K 5.6 (H) 05/20/2017   CL 88 (L) 05/20/2017   CO2 23 06/02/2017    Lab Results  Component Value Date   ALT 10 (L) 05/14/2017   AST 19 05/24/2017   ALKPHOS 56 05/30/2017   BILITOT 1.1 05/26/2017     Microbiology: Recent Results (from the past 240 hour(s))  Blood Culture (routine x 2)     Status: None (Preliminary result)   Collection Time: 05/04/2017  4:12 PM  Result Value Ref Range Status   Specimen Description BLOOD RIGHT  ANTECUBITAL  Final   Special Requests   Final    BOTTLES DRAWN AEROBIC AND ANAEROBIC Blood Culture adequate volume   Culture  Setup Time   Final    GRAM POSITIVE COCCI IN CLUSTERS IN BOTH AEROBIC AND ANAEROBIC BOTTLES CRITICAL RESULT CALLED TO, READ BACK BY AND VERIFIED WITH: G.ABBOTT PHARMD 05/04/17 0643 L.CHAMPION    Culture GRAM POSITIVE COCCI IN CLUSTERS  Final   Report Status PENDING  Incomplete  Blood Culture ID Panel (Reflexed)     Status: Abnormal   Collection Time: 05/13/2017  4:12 PM  Result Value Ref Range Status   Enterococcus species NOT DETECTED NOT DETECTED Final   Listeria monocytogenes NOT DETECTED NOT DETECTED Final   Staphylococcus species DETECTED (A) NOT DETECTED Final    Comment: CRITICAL RESULT CALLED TO, READ BACK BY AND VERIFIED WITH: G.ABBOTT PHARMD 05/04/17 0643 L.CHAMPION    Staphylococcus aureus DETECTED (A) NOT DETECTED Final    Comment: Methicillin (oxacillin)-resistant Staphylococcus aureus (MRSA). MRSA is predictably resistant to beta-lactam antibiotics (except ceftaroline). Preferred therapy is vancomycin unless clinically contraindicated. Patient requires contact precautions if  hospitalized. CRITICAL RESULT CALLED TO, READ BACK BY AND VERIFIED WITH: G.ABBOTT PHARMD 05/04/17 0643 L.CHAMPION    Methicillin resistance DETECTED (A) NOT DETECTED Final      Comment: CRITICAL RESULT CALLED TO, READ BACK BY AND VERIFIED WITH: G.ABBOTT PHARMD 05/04/17 0643 L.CHAMPION    Streptococcus species NOT DETECTED NOT DETECTED Final   Streptococcus agalactiae NOT DETECTED NOT DETECTED Final   Streptococcus pneumoniae NOT DETECTED NOT DETECTED Final   Streptococcus pyogenes NOT DETECTED NOT DETECTED Final   Acinetobacter baumannii NOT DETECTED NOT DETECTED Final   Enterobacteriaceae species NOT DETECTED NOT DETECTED Final   Enterobacter cloacae complex NOT DETECTED NOT DETECTED Final   Escherichia coli NOT DETECTED NOT DETECTED Final   Klebsiella oxytoca NOT DETECTED NOT DETECTED Final   Klebsiella pneumoniae NOT DETECTED NOT DETECTED Final   Proteus species NOT DETECTED NOT DETECTED Final   Serratia marcescens NOT DETECTED NOT DETECTED Final   Haemophilus influenzae NOT DETECTED NOT DETECTED Final   Neisseria meningitidis NOT DETECTED NOT DETECTED Final   Pseudomonas aeruginosa NOT DETECTED NOT DETECTED Final   Candida albicans NOT DETECTED NOT DETECTED Final   Candida glabrata NOT DETECTED NOT DETECTED Final   Candida krusei NOT DETECTED NOT DETECTED Final   Candida parapsilosis NOT DETECTED NOT DETECTED Final   Candida tropicalis NOT DETECTED NOT DETECTED Final  Blood Culture (routine x 2)     Status: None (Preliminary result)   Collection Time: 05/19/2017  4:25 PM  Result Value Ref Range Status   Specimen Description BLOOD RIGHT HAND  Final   Special Requests   Final    BOTTLES DRAWN AEROBIC AND ANAEROBIC Blood Culture adequate volume   Culture  Setup Time   Final    GRAM POSITIVE COCCI IN CLUSTERS IN BOTH AEROBIC AND ANAEROBIC BOTTLES CRITICAL VALUE NOTED.  VALUE IS CONSISTENT WITH PREVIOUSLY REPORTED AND CALLED VALUE.    Culture GRAM POSITIVE COCCI IN CLUSTERS  Final   Report Status PENDING  Incomplete          Antimicrobial Management Team Staphylococcus aureus bacteremia   Staphylococcus aureus bacteremia (SAB) is  associated with a high rate of complications and mortality.  Specific aspects of clinical management are critical to optimizing the outcome of patients with SAB.  Therefore, the [facility] Antimicrobial Management Team [facility]) has initiated an intervention aimed at improving the management of SAB at  [place].  To do so, Infectious Diseases physicians are providing an evidence-based consult for the management of all patients with SAB.     Yes No Comments  Perform follow-up blood cultures (even if the patient is afebrile) to ensure clearance of bacteremia []  []  Ordered for 05/05/17  Remove vascular catheter and obtain follow-up blood cultures after the removal of the catheter []  []  N/A  Perform echocardiography to evaluate for endocarditis (transthoracic ECHO is 40-50% sensitive, TEE is > 90% sensitive) []  []  Please keep in mind, that neither test can definitively EXCLUDE endocarditis, and that should clinical suspicion remain high for endocarditis the patient should then still be treated with an "endocarditis" duration of therapy = 6 weeks  Consult electrophysiologist to evaluate implanted cardiac device (pacemaker, ICD) []  []  N/A  Ensure source control []  []  Have all abscesses been drained effectively? Have deep seeded infections (septic joints or osteomyelitis) had appropriate surgical debridement?  Investigate for "metastatic" sites of infection []  []  Does the patient have ANY symptom or physical exam finding that would suggest a deeper infection (back or neck pain that may be suggestive of vertebral osteomyelitis or epidural abscess, muscle pain that could be a symptom of pyomyositis)?  Keep in mind that for deep seeded infections MRI imaging with contrast is preferred rather than other often insensitive tests such as plain x-rays, especially early in a patient's presentation.  Change antibiotic therapy to Vancomycin  [x]  []  Beta-lactam antibiotics are preferred for MSSA due to higher cure rates.     If on Vancomycin, goal trough should be 15 - 20 mcg/mL  Estimated duration of IV antibiotic therapy:   []  []  Consult case management for probably prolonged outpatient IV antibiotic therapy     Janene Madeira, MSN, NP-C [place] for Infectious [place] Pager: [phone]  05/04/2017 8:59 AM

## 2017-05-04 NOTE — Progress Notes (Signed)
PROGRESS NOTE    Kimberly [name]  GQQ:[id number] DOB: May 31, 1928 DOA: 05/16/2017 PCP: Abner Greenspan, MD    Brief Narrative:83 year old lady with prior h/o chronic hyponatremia, hypertension, CAD,  Dementia, chronic systolic and diastolic heart failure recently discharged from [facility], comes in for acute respiratory failure with hypoxia and acute encephalopathy.   Assessment & Plan:   Principal Problem:   Sepsis ([place]) Active Problems:   Anxiety   Essential hypertension   LBBB (left bundle branch block)   Frequent UTI   Obesity   Hyponatremia   Chronic systolic heart failure (HCC)   Hyperkalemia   Acute hypoxemic respiratory failure (HCC)   Acute respiratory failure with hypoxia possibly from mild pulmonary edema from acute on chronic systolic heart failure.  - pt came in hypotensive on arrival and did not receive any diuretics.  Restart lasix in am and follow urine output.  Strict intake and output.  Follow echocardiogram.   Sepsis from MRSA bacteremia:  On admission, she was hypotensive, febrile, tachypnea, lactic acid and acute renal failure. Elevated pro calcitonin.  On IV vancomycin, ID on board.  Echocardiogram to evaluate for endocarditis.  Follow urine and blood cultures.    Acute renal failure, hyperkalemia, and hyponatremia:  Hyponatremia is chronic, worse on admission from pulmonary edema and fluid over load.   Hyperkalemia: pt is on potassium supplements at home. Stop the supplements.  ekg reviewed.  One dose of calcium gluconate ordered, kayexalate ordered.     Nutrition: on admission, she was encephalopathic.  Get SLP eval.    Acute encephalopathy: possibly from acute respiratory failure and sepsis, improved.    Acute renal failure:  Unclear etiology, possibly fluid overload vs ATN from sepsis.  Get US RENAL.  UA shows small leukocytes and many bacteria. Urine cultures are pending.    Hypertension: controlled.   Hypothyroidism: Resume synthroid.     DVT prophylaxis: heparin sq Code Status:DNR  Family Communication: family  at bedside.  Disposition Plan: pending echocardiogram.    Consultants:   Infectious disease.   Procedures: none.  Antimicrobials: vancomycin.   Subjective: No chest pain or sob. No nausea or vomiting.   Objective: Vitals:   05/04/17 1300 05/04/17 1315 05/04/17 1400 05/04/17 1619  BP: (!) 111/44 (!) 118/46 (!) 118/46 (!) 134/56  Pulse: 77 75 76 90  Resp: 13 13 16  (!) 24  Temp:    98.8 F (37.1 C)  TempSrc:    Oral  SpO2: 96% 96% 98% 100%    Intake/Output Summary (Last 24 hours) at 05/04/17 1639 Last data filed at 05/04/17 0253  Gross per 24 hour  Intake             3160 ml  Output                0 ml  Net             3160 ml   There were no vitals filed for this visit.  Examination:  General exam: Appears calm and comfortable  Respiratory system: Clear to auscultation. Respiratory effort normal. Cardiovascular system: S1 & S2 heard, RRR. No JVD, murmurs, rubs, gallops or clicks. No pedal edema. Gastrointestinal system: Abdomen is nondistended, soft and nontender. No organomegaly or masses felt. Normal bowel sounds heard. Central nervous system: Alert and answering some simple questions. Able to move all extremities.  Extremities: left arm sling, trace edema. Skin: No rashes, lesions or ulcers Psychiatry: Judgement and insight appear normal. Mood & affect  appropriate.     Data Reviewed: I have personally reviewed following labs and imaging studies  CBC:  Recent Labs Lab 04/28/17 0617 05/01/17 0530 05/26/2017 1616 05/29/2017 1645 05/17/2017 2127 05/04/17 0802  WBC 5.1 7.6 15.7*  --  21.9* 20.6*  NEUTROABS  --  6.4 14.7*  --   --   --   HGB 8.8* 9.5* 8.6* 9.2* 8.7* 9.6*  HCT 25.3* 28.3* 26.7* 27.0* 26.9* 29.6*  MCV 83.7 87.6 85.6  --  86.2 86.3  PLT 149* 173 187  --  193 322   Basic Metabolic Panel:  Recent Labs Lab 04/30/17 0347 05/01/17 0530 05/25/2017 1616 05/25/2017 1645  05/31/2017 2127 05/04/17 0802  NA 126* 126* 119* 118* 119* 124*  K 4.5 4.7 6.0* 6.3* 5.6* 5.9*  CL 92* 89* 83* 81* 88* 90*  CO2 28 28 25   --  23 19*  GLUCOSE 110* 126* 148* 145* 151* 133*  BUN 16 18 44* 57* 44* 44*  CREATININE 1.05* 1.07* 2.63* 2.70* 2.50* 2.21*  CALCIUM 8.0* 8.1* 7.8*  --  7.7* 7.9*   GFR: Estimated Creatinine Clearance: 17.7 mL/min (A) (by C-G formula based on SCr of 2.21 mg/dL (H)). Liver Function Tests:  Recent Labs Lab 05/19/2017 1616  AST 19  ALT 10*  ALKPHOS 56  BILITOT 1.1  PROT 5.5*  ALBUMIN 2.6*   No results for input(s): LIPASE, AMYLASE in the last 168 hours. No results for input(s): AMMONIA in the last 168 hours. Coagulation Profile:  Recent Labs Lab 05/24/2017 2127  INR 1.31   Cardiac Enzymes:  Recent Labs Lab 05/26/2017 2127 05/04/17 0314 05/04/17 0802  TROPONINI 0.54* 0.31* 0.33*   BNP (last 3 results)  Recent Labs  04/15/17 1307  PROBNP 3,962.0*   HbA1C: No results for input(s): HGBA1C in the last 72 hours. CBG: No results for input(s): GLUCAP in the last 168 hours. Lipid Profile: No results for input(s): CHOL, HDL, LDLCALC, TRIG, CHOLHDL, LDLDIRECT in the last 72 hours. Thyroid Function Tests: No results for input(s): TSH, T4TOTAL, FREET4, T3FREE, THYROIDAB in the last 72 hours. Anemia Panel: No results for input(s): VITAMINB12, FOLATE, FERRITIN, TIBC, IRON, RETICCTPCT in the last 72 hours. Sepsis Labs:  Recent Labs Lab 05/09/2017 1645 05/08/2017 2127 05/04/17 0314  PROCALCITON  --  5.79  --   LATICACIDVEN 1.79 1.3 1.1    Recent Results (from the past 240 hour(s))  Blood Culture (routine x 2)     Status: None (Preliminary result)   Collection Time: 05/26/2017  4:12 PM  Result Value Ref Range Status   Specimen Description BLOOD RIGHT ANTECUBITAL  Final   Special Requests   Final    BOTTLES DRAWN AEROBIC AND ANAEROBIC Blood Culture adequate volume   Culture  Setup Time   Final    GRAM POSITIVE COCCI IN CLUSTERS IN BOTH  AEROBIC AND ANAEROBIC BOTTLES CRITICAL RESULT CALLED TO, READ BACK BY AND VERIFIED WITH: G.ABBOTT PHARMD 05/04/17 0643 L.CHAMPION    Culture GRAM POSITIVE COCCI IN CLUSTERS  Final   Report Status PENDING  Incomplete  Blood Culture ID Panel (Reflexed)     Status: Abnormal   Collection Time: 06/01/2017  4:12 PM  Result Value Ref Range Status   Enterococcus species NOT DETECTED NOT DETECTED Final   Listeria monocytogenes NOT DETECTED NOT DETECTED Final   Staphylococcus species DETECTED (A) NOT DETECTED Final    Comment: CRITICAL RESULT CALLED TO, READ BACK BY AND VERIFIED WITH: G.ABBOTT PHARMD 05/04/17 0643 L.CHAMPION    Staphylococcus  aureus DETECTED (A) NOT DETECTED Final    Comment: Methicillin (oxacillin)-resistant Staphylococcus aureus (MRSA). MRSA is predictably resistant to beta-lactam antibiotics (except ceftaroline). Preferred therapy is vancomycin unless clinically contraindicated. Patient requires contact precautions if  hospitalized. CRITICAL RESULT CALLED TO, READ BACK BY AND VERIFIED WITH: G.ABBOTT PHARMD 05/04/17 0643 L.CHAMPION    Methicillin resistance DETECTED (A) NOT DETECTED Final    Comment: CRITICAL RESULT CALLED TO, READ BACK BY AND VERIFIED WITH: G.ABBOTT PHARMD 05/04/17 0643 L.CHAMPION    Streptococcus species NOT DETECTED NOT DETECTED Final   Streptococcus agalactiae NOT DETECTED NOT DETECTED Final   Streptococcus pneumoniae NOT DETECTED NOT DETECTED Final   Streptococcus pyogenes NOT DETECTED NOT DETECTED Final   Acinetobacter baumannii NOT DETECTED NOT DETECTED Final   Enterobacteriaceae species NOT DETECTED NOT DETECTED Final   Enterobacter cloacae complex NOT DETECTED NOT DETECTED Final   Escherichia coli NOT DETECTED NOT DETECTED Final   Klebsiella oxytoca NOT DETECTED NOT DETECTED Final   Klebsiella pneumoniae NOT DETECTED NOT DETECTED Final   Proteus species NOT DETECTED NOT DETECTED Final   Serratia marcescens NOT DETECTED NOT DETECTED Final   Haemophilus  influenzae NOT DETECTED NOT DETECTED Final   Neisseria meningitidis NOT DETECTED NOT DETECTED Final   Pseudomonas aeruginosa NOT DETECTED NOT DETECTED Final   Candida albicans NOT DETECTED NOT DETECTED Final   Candida glabrata NOT DETECTED NOT DETECTED Final   Candida krusei NOT DETECTED NOT DETECTED Final   Candida parapsilosis NOT DETECTED NOT DETECTED Final   Candida tropicalis NOT DETECTED NOT DETECTED Final  Blood Culture (routine x 2)     Status: None (Preliminary result)   Collection Time: 05/31/2017  4:25 PM  Result Value Ref Range Status   Specimen Description BLOOD RIGHT HAND  Final   Special Requests   Final    BOTTLES DRAWN AEROBIC AND ANAEROBIC Blood Culture adequate volume   Culture  Setup Time   Final    GRAM POSITIVE COCCI IN CLUSTERS IN BOTH AEROBIC AND ANAEROBIC BOTTLES CRITICAL VALUE NOTED.  VALUE IS CONSISTENT WITH PREVIOUSLY REPORTED AND CALLED VALUE.    Culture GRAM POSITIVE COCCI IN CLUSTERS  Final   Report Status PENDING  Incomplete         Radiology Studies: Dg Chest Port 1 View  Result Date: 05/17/2017 CLINICAL DATA:  Severe dyspnea EXAM: PORTABLE CHEST 1 VIEW COMPARISON:  04/30/2017 chest radiograph. FINDINGS: Low lung volumes. Stable cardiomediastinal silhouette with mild cardiomegaly and aortic atherosclerosis. No pneumothorax. Small right pleural effusion appears new. No left pleural effusion. Hazy and linear parahilar lung opacities, asymmetric to the right, favor mild pulmonary edema. Mild bibasilar atelectasis. IMPRESSION: 1. Stable cardiomegaly with hazy and linear parahilar lung opacities asymmetric to the right, favor mild pulmonary edema due to mild congestive heart failure. 2. Small right pleural effusion. 3. Low lung volumes with mild bibasilar atelectasis. Electronically Signed   By: Ilona Sorrel M.D.   On: 05/27/2017 17:14        Scheduled Meds: . aspirin EC  81 mg Oral Daily  . calcitonin (salmon)  1 spray Alternating Nares Daily  .  heparin  5,000 Units Subcutaneous Q8H  . levothyroxine  25 mcg Oral QAC breakfast  . sodium polystyrene  30 g Oral Once   Continuous Infusions: . [START ON 05/05/2017] vancomycin       LOS: 1 day    Time spent: 35 minutes.    Hosie Poisson, MD [facility] Pager [phone]  If 7PM-7AM, please contact night-coverage [email or web address] Password [email or web address]  05/04/2017, 4:39 PM

## 2017-05-04 NOTE — Progress Notes (Signed)
RN called requesting trial off bipap.  Pt is awake, and oriented.  Pt placed on 5 lpm [place], no distress noted.  Pt states only mild SOB, but states she doesn't feel she needs bipap at this time.  RN aware.  NP at bedside currently w/ pt and daughter.

## 2017-05-05 ENCOUNTER — Other Ambulatory Visit (HOSPITAL_COMMUNITY): Payer: Medicare Other

## 2017-05-05 ENCOUNTER — Inpatient Hospital Stay (HOSPITAL_COMMUNITY): Payer: Medicare Other

## 2017-05-05 DIAGNOSIS — Z6833 Body mass index (BMI) 33.0-33.9, adult: Secondary | ICD-10-CM

## 2017-05-05 DIAGNOSIS — I4891 Unspecified atrial fibrillation: Secondary | ICD-10-CM

## 2017-05-05 DIAGNOSIS — I447 Left bundle-branch block, unspecified: Secondary | ICD-10-CM

## 2017-05-05 DIAGNOSIS — E6609 Other obesity due to excess calories: Secondary | ICD-10-CM

## 2017-05-05 DIAGNOSIS — E871 Hypo-osmolality and hyponatremia: Secondary | ICD-10-CM

## 2017-05-05 DIAGNOSIS — R7881 Bacteremia: Secondary | ICD-10-CM

## 2017-05-05 DIAGNOSIS — I10 Essential (primary) hypertension: Secondary | ICD-10-CM

## 2017-05-05 DIAGNOSIS — A4102 Sepsis due to Methicillin resistant Staphylococcus aureus: Principal | ICD-10-CM

## 2017-05-05 LAB — COMPREHENSIVE METABOLIC PANEL
ALK PHOS: 60 U/L (ref 38–126)
ALT: 13 U/L — ABNORMAL LOW (ref 14–54)
ANION GAP: 11 (ref 5–15)
AST: 15 U/L (ref 15–41)
Albumin: 2.1 g/dL — ABNORMAL LOW (ref 3.5–5.0)
BILIRUBIN TOTAL: 0.9 mg/dL (ref 0.3–1.2)
BUN: 58 mg/dL — ABNORMAL HIGH (ref 6–20)
CALCIUM: 7.9 mg/dL — AB (ref 8.9–10.3)
CO2: 24 mmol/L (ref 22–32)
Chloride: 89 mmol/L — ABNORMAL LOW (ref 101–111)
Creatinine, Ser: 1.97 mg/dL — ABNORMAL HIGH (ref 0.44–1.00)
GFR, EST AFRICAN AMERICAN: 25 mL/min — AB (ref >60)
GFR, EST NON AFRICAN AMERICAN: 22 mL/min — AB (ref >60)
Glucose, Bld: 103 mg/dL — ABNORMAL HIGH (ref 65–99)
Potassium: 5.1 mmol/L (ref 3.5–5.1)
Sodium: 124 mmol/L — ABNORMAL LOW (ref 135–145)
TOTAL PROTEIN: 5.3 g/dL — AB (ref 6.5–8.1)

## 2017-05-05 LAB — CBC
HCT: 25.5 % — ABNORMAL LOW (ref 36.0–46.0)
HEMOGLOBIN: 8.1 g/dL — AB (ref 12.0–15.0)
MCH: 27.1 pg (ref 26.0–34.0)
MCHC: 31.8 g/dL (ref 30.0–36.0)
MCV: 85.3 fL (ref 78.0–100.0)
Platelets: 203 10*3/uL (ref 150–400)
RBC: 2.99 MIL/uL — ABNORMAL LOW (ref 3.87–5.11)
RDW: 14.3 % (ref 11.5–15.5)
WBC: 15.1 10*3/uL — ABNORMAL HIGH (ref 4.0–10.5)

## 2017-05-05 LAB — URINE CULTURE: Culture: <10000 — AB

## 2017-05-05 MED ORDER — VANCOMYCIN HCL IN DEXTROSE 1-5 GM/200ML-% IV SOLN
1000.0000 mg | INTRAVENOUS | Status: DC
  Administered 2017-05-05: 1000 mg via INTRAVENOUS
  Filled 2017-05-05 (×2): qty 200

## 2017-05-05 MED ORDER — METOPROLOL TARTRATE 25 MG PO TABS
25.0000 mg | ORAL_TABLET | Freq: Three times a day (TID) | ORAL | Status: DC
  Administered 2017-05-05: 25 mg via ORAL
  Filled 2017-05-05 (×2): qty 1

## 2017-05-05 MED ORDER — METOPROLOL TARTRATE 5 MG/5ML IV SOLN
2.5000 mg | Freq: Once | INTRAVENOUS | Status: AC
  Administered 2017-05-05: 2.5 mg via INTRAVENOUS
  Filled 2017-05-05: qty 5

## 2017-05-05 MED ORDER — SODIUM CHLORIDE 0.9 % IV BOLUS (SEPSIS)
250.0000 mL | Freq: Once | INTRAVENOUS | Status: AC
  Administered 2017-05-05: 250 mL via INTRAVENOUS

## 2017-05-05 MED ORDER — DILTIAZEM HCL 25 MG/5ML IV SOLN
10.0000 mg | Freq: Once | INTRAVENOUS | Status: AC
  Administered 2017-05-05: 10 mg via INTRAVENOUS
  Filled 2017-05-05: qty 5

## 2017-05-05 MED ORDER — METOPROLOL TARTRATE 5 MG/5ML IV SOLN
2.5000 mg | Freq: Four times a day (QID) | INTRAVENOUS | Status: DC
  Administered 2017-05-05: 2.5 mg via INTRAVENOUS
  Filled 2017-05-05: qty 5

## 2017-05-05 MED ORDER — TRAMADOL HCL 50 MG PO TABS: 50.0000 mg | ORAL_TABLET | Freq: Four times a day (QID) | ORAL | Status: DC | PRN

## 2017-05-05 NOTE — Progress Notes (Signed)
Pharmacy Antibiotic Note  Kimberly [name] is a 83 y.o. female admitted on 05/17/2017 with bacteremia.  Pharmacy has been consulted for Vancomycin dosing. She is on Day #3 of Vancomycin for MRSA bacteremia. Her creatinine is elevated but improving and her Vancomycin regimen requires adjustment. Will continue to monitor her renal function closely.  Plan: Change Vancomycin to 1g IV q24h Check Vancomycin trough before the 4th dose BMET with AM labs and daily while creatinine is changing  Height: 5\' 2"  (157.5 cm) Weight: 187 lb (84.8 kg) IBW/kg (Calculated) : 50.1  Temp (24hrs), Avg:98.9 F (37.2 C), Min:96.7 F (35.9 C), Max:100.1 F (37.8 C)   Recent Labs Lab 05/01/17 0530 05/22/2017 1616 05/13/2017 1645 05/13/2017 2127 05/04/17 0314 05/04/17 0802 05/05/17 0652  WBC 7.6 15.7*  --  21.9*  --  20.6* 15.1*  CREATININE 1.07* 2.63* 2.70* 2.50*  --  2.21* 1.97*  LATICACIDVEN  --   --  1.79 1.3 1.1  --   --     Estimated Creatinine Clearance: 20.3 mL/min (A) (by C-G formula based on SCr of 1.97 mg/dL (H)).    Allergies  Allergen Reactions  . Ciprofloxacin Other (See Comments)    REACTION: burning of mouth and skin  . Lisinopril Cough  . Remeron [Mirtazapine] Other (See Comments)    constipation  . Sulfonamide Derivatives Other (See Comments)    REACTION: malaise  . Tetanus Toxoid Swelling  . Vesicare [Solifenacin Succinate] Other (See Comments)    Blurred vision and weakness  . Zoloft [Sertraline Hcl] Nausea And Vomiting  . Cephalexin Rash and Other (See Comments)    REACTION: rash and burning sensation to skin on arms and legs with itching.  Madelin Headings [Amitriptyline] Palpitations  . Penicillins Rash and Other (See Comments)    Has patient had a PCN reaction causing immediate rash, facial/tongue/throat swelling, SOB or lightheadedness with hypotension: Unknown Has patient had a PCN reaction causing severe rash involving mucus membranes or skin necrosis: Unknown Has patient had a PCN  reaction that required hospitalization: Unknown Has patient had a PCN reaction occurring within the last 10 years: No If all of the above answers are "NO", then may proceed with Cephalosporin use. Has since take Augmentin with no problem    Antimicrobials this admission: Vanc 10/1>> Aztreo 10/1>>10/2  Microbiology results: 10/1 Blood - MRSA 10/3 Blood:   Thank you for allowing pharmacy to be a part of this patient's care.  Norva Riffle 05/05/2017 11:27 AM

## 2017-05-05 NOTE — NC FL2 (Signed)
[place] MEDICAID FL2 LEVEL OF CARE SCREENING TOOL     IDENTIFICATION  Patient Name: Kimberly [name] Birthdate: [birth year] Sex: female Admission Date (Current Location): 05/09/2017  [facility] and [place] Number:  Engineering geologist and Address:  The . [facility], [place] [address], [place], [place] [address]      Provider Number: [id number]  Attending Physician Name and Address:  Caren Griffins, MD  Relative Name and Phone Number:       Current Level of Care: Hospital Recommended Level of Care: Many Prior Approval Number:    Date Approved/Denied:   PASRR Number: [id number]  Discharge Plan: SNF    Current Diagnoses: Patient Active Problem List   Diagnosis Date Noted  . New onset atrial fibrillation ([place]) 05/05/2017  . Septic shock ([place])   . MRSA bacteremia   . Fracture of anatomical neck of humerus, right, closed, initial encounter   . Stable burst fracture of T10 vertebra with delayed healing   . Sepsis ([place]) 05/16/2017  . Hyperkalemia 05/20/2017  . Acute hypoxemic respiratory failure ([place]) 05/14/2017  . CHF (congestive heart failure) ([place]) 04/27/2017  . Shortness of breath 04/15/2017  . Chronic combined systolic and diastolic heart failure ([place]) 03/11/2017  . AKI (acute kidney injury) ([place]) 03/02/2017  . Orthostatic hypotension 03/02/2017  . Frequent PVCs 03/02/2017  . Frequent urination 12/07/2016  . Low back pain 12/07/2016  . Fall 12/07/2016  . Thoracic compression fracture ([place]) 12/07/2016  . Estrogen deficiency 12/07/2016  . Lower extremity edema 06/19/2016  . Carotid atherosclerosis 04/29/2016  . Medicare annual wellness visit, initial 05/27/2015  . Routine general medical examination at a health care facility 05/27/2015  . Colon cancer screening 05/27/2015  . Hyposmolality and/or hyponatremia 05/28/2014  . Hypokalemia 05/28/2014  . Hyponatremia 01/31/2014  . Unspecified constipation 01/31/2014  . Left thyroid  nodule 08/04/2012  . Obesity 12/04/2011  . Gastritis 12/04/2011  . Heart palpitations 09/01/2011  . Depression 05/26/2011  . Frequent UTI 12/30/2010  . Lower abdominal pain 11/27/2010  . COLONIC POLYPS 04/09/2010  . Anemia 04/09/2010  . HEMORRHOIDS 03/04/2010  . Anxiety 12/10/2009  . PERSONAL HX COLONIC POLYPS 06/19/2008  . IRRITABLE BOWEL SYNDROME 03/28/2008  . 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 GOITER 03/29/2007  . Hypothyroidism 03/29/2007  . Hyperlipidemia 03/29/2007  . Essential hypertension 03/29/2007  . Coronary atherosclerosis 03/29/2007  . LBBB (left bundle branch block) 03/29/2007  . ALLERGIC RHINITIS 03/29/2007  . GERD 03/29/2007  . HYPERGLYCEMIA 03/29/2007    Orientation RESPIRATION BLADDER Height & Weight     Self, Time, Place, Situation  O2 (Nasal cannula 3L) Continent, External catheter Weight: 84.8 kg (187 lb) Height:  5\' 2"  (157.5 cm)  BEHAVIORAL SYMPTOMS/MOOD NEUROLOGICAL BOWEL NUTRITION STATUS      Incontinent Diet (Please see DC Summary)  AMBULATORY STATUS COMMUNICATION OF NEEDS Skin   Limited Assist Verbally Normal                       Personal Care Assistance Level of Assistance  Bathing, Feeding, Dressing Bathing Assistance: Maximum assistance Feeding assistance: Limited assistance Dressing Assistance: Limited assistance     Functional Limitations Info  Sight, Hearing, Speech Sight Info: Adequate Hearing Info: Adequate Speech Info: Adequate    SPECIAL CARE FACTORS FREQUENCY  PT (By licensed PT)     PT Frequency: 5x/week              Contractures Contractures Info: Not present  Additional Factors Info  Code Status, Allergies, Isolation Precautions Code Status Info: DNR Allergies Info: Ciprofloxacin, Lisinopril, Remeron Mirtazapine, Sulfonamide Derivatives, Tetanus Toxoid, Vesicare Solifenacin Succinate, Zoloft Sertraline Hcl, Cephalexin, Elavil Amitriptyline, Penicillins     Isolation Precautions Info: MRSA      Current Medications (05/05/2017):  This is the current hospital active medication list Current Facility-Administered Medications  Medication Dose Route Frequency Provider Last Rate Last Dose  . aspirin EC tablet 81 mg  81 mg Oral Daily Derrill Kay A, MD   81 mg at 05/05/17 0915  . calcitonin (salmon) (MIACALCIN/FORTICAL) nasal spray 1 spray  1 spray Alternating Nares Daily Derrill Kay A, MD      . Chlorhexidine Gluconate Cloth 2 % PADS 6 each  6 each Topical Q0600 Hosie Poisson, MD   6 each at 05/05/17 0438  . heparin injection 5,000 Units  5,000 Units Subcutaneous Q8H Phillips Grout, MD   5,000 Units at 05/05/17 0438  . levothyroxine (SYNTHROID, LEVOTHROID) tablet 25 mcg  25 mcg Oral QAC breakfast Phillips Grout, MD   25 mcg at 05/05/17 0914  . MEDLINE mouth rinse  15 mL Mouth Rinse BID Hosie Poisson, MD   15 mL at 05/05/17 0916  . metoprolol tartrate (LOPRESSOR) injection 2.5 mg  2.5 mg Intravenous Q6H Strader, [place] M, PA-C   2.5 mg at 05/05/17 1513  . mupirocin ointment (BACTROBAN) 2 % 1 application  1 application Nasal BID Hosie Poisson, MD   1 application at 03/47/42 0916  . ondansetron (ZOFRAN) tablet 4 mg  4 mg Oral Q6H PRN Phillips Grout, MD       Or  . ondansetron [facility]) injection 4 mg  4 mg Intravenous Q6H PRN Derrill Kay A, MD      . oxyCODONE (Oxy IR/ROXICODONE) immediate release tablet 5 mg  5 mg Oral Q4H PRN Phillips Grout, MD   5 mg at 05/05/17 0422  . polyethylene glycol (MIRALAX / GLYCOLAX) packet 17 g  17 g Oral Daily PRN Hosie Poisson, MD      . senna-docusate (Senokot-S) tablet 1 tablet  1 tablet Oral BID Hosie Poisson, MD   1 tablet at 05/05/17 0915  . vancomycin (VANCOCIN) IVPB 1000 mg/200 mL premix  1,000 mg Intravenous Q24H Norva Riffle, [facility]         Discharge Medications: Please see discharge summary for a list of discharge medications.  Relevant Imaging Results:  Relevant Lab Results:   Additional Information SSN [id number]  Benard Halsted, LCSWA

## 2017-05-05 NOTE — Progress Notes (Signed)
PROGRESS NOTE  Kimberly [name] JKD:[id number] DOB: Aug 23, 1927 DOA: 05/19/2017 PCP: Abner Greenspan, MD   LOS: 2 days   Brief Narrative / Interim history: 83 year old lady with prior h/o chronic hyponatremia, hypertension, CAD,  Dementia, chronic systolic and diastolic heart failure recently discharged from [facility], comes in for acute respiratory failure with hypoxia and acute encephalopathy.  She was found to have MRSA bacteremia.  Assessment & Plan: Principal Problem:   Sepsis ([place]) Active Problems:   Anxiety   Essential hypertension   LBBB (left bundle branch block)   Frequent UTI   Obesity   Hyponatremia   Chronic systolic heart failure (HCC)   Hyperkalemia   Acute hypoxemic respiratory failure (H[facility])   Septic shock (H[facility])   MRSA bacteremia   Fracture of anatomical neck of humerus, right, closed, initial encounter   Stable burst fracture of T10 vertebra with delayed healing   Acute respiratory failure with hypoxia possibly from mild pulmonary edema from acute on chronic systolic heart failure -Most recent 2D echo was done in July 2018 and showed an EF of 35% -pt came in hypotensive on arrival and did not receive any diuretics.  -Remains hypotensive overnight, unable to use Lasix this morning. -Repeat 2D echo is pending  Severe sepsis due to MRSA bacteremia -Patient was hypotensive, febrile, tachypneic on admission meeting criteria. -On vancomycin since 10/1, infectious disease following, appreciate input.  Atrial fibrillation with RVR -patient's CHA2DS2-VASc Score for Stroke Risk is > 2 -This appears to be new as I do not see any evidence of A. fib in outpatient cardiology notes. -Doubt she is a candidate for anticoagulation with her deconditioning and recent fall -Consulted cardiology, appreciate input. -She received bolus overnight as well as a dose of 2.5 IV metoprolol which improved her rates some however they are elevated again this morning  Coronary artery  disease -Followed by Dr. Rockey Situ in [place], also recently had a cardiac catheterization in August 2018 which showed RCA 30% stenosed, proximal 30% LAD stenosis -Reports no chest pain, cardiology to see -Troponin elevation likely due to demand ischemia in the setting of sepsis  Acute kidney injury on CKD III, hyperkalemia, and hyponatremia -Hyponatremia is somewhat chronic, likely worse due to fluid overload -Baseline creatinine 1.1-1.2, worsening now in the setting of sepsis, with hypotension concern for cardiorenal syndrome.  Cardiology to see.  Acute encephalopathy  -possibly from acute respiratory failure and sepsis, remains somewhat drowsy today  Hypertension -Now hypotensive as above   Hypothyroidism -Resume synthroid.    DVT prophylaxis: heparin Code Status: DNR Family Communication: no family at bedside Disposition Plan: TBD  Consultants:   ID  Cardiology   Procedures:   2D echo: pending  Antimicrobials:  Vancomycin 05/26/2017 >>  Subjective: -No major complaints, denies any chest pain or shortness of breath.  She is somewhat lethargic and confused.  Objective: Vitals:   05/05/17 0653 05/05/17 0726 05/05/17 0751 05/05/17 0753  BP:  106/72 106/72   Pulse: (!) 114 (!) 108 (!) 120   Resp: 18 19 16    Temp:  (!) 96.7 F (35.9 C)    TempSrc:  Axillary    SpO2: 98% 98% 97% 96%  Weight:      Height:        Intake/Output Summary (Last 24 hours) at 05/05/17 1058 Last data filed at 05/05/17 0900  Gross per 24 hour  Intake              110 ml  Output  250 ml  Net             -140 ml   Filed Weights   05/04/17 1619 05/05/17 0438  Weight: 84.2 kg (185 lb 11.2 oz) 84.8 kg (187 lb)    Examination:  Constitutional: pale, ill appearing caucasian female  Eyes: lids and conjunctivae normal ENMT: Mucous membranes are moist. No oropharyngeal exudates Neck: normal, supple, no masses, no thyromegaly Respiratory: shallow breathing, no wheezing,  no crackles  Cardiovascular: irregular, no murmurs. Trace LE edema.  Abdomen: no tenderness. Bowel sounds positive.  Musculoskeletal: no clubbing / cyanosis. Left wrist swelling, tender to palpation Neurologic: grossly non focal    Data Reviewed: I have independently reviewed following labs and imaging studies   CBC:  Recent Labs Lab 05/01/17 0530 05/16/2017 1616 05/27/2017 1645 05/13/2017 2127 05/04/17 0802 05/05/17 0652  WBC 7.6 15.7*  --  21.9* 20.6* 15.1*  NEUTROABS 6.4 14.7*  --   --   --   --   HGB 9.5* 8.6* 9.2* 8.7* 9.6* 8.1*  HCT 28.3* 26.7* 27.0* 26.9* 29.6* 25.5*  MCV 87.6 85.6  --  86.2 86.3 85.3  PLT 173 187  --  193 175 269   Basic Metabolic Panel:  Recent Labs Lab 05/01/17 0530 05/12/2017 1616 05/10/2017 1645 05/16/2017 2127 05/04/17 0802 05/04/17 2049 05/05/17 0652  NA 126* 119* 118* 119* 124*  --  124*  K 4.7 6.0* 6.3* 5.6* 5.9* 5.5* 5.1  CL 89* 83* 81* 88* 90*  --  89*  CO2 28 25  --  23 19*  --  24  GLUCOSE 126* 148* 145* 151* 133*  --  103*  BUN 18 44* 57* 44* 44*  --  58*  CREATININE 1.07* 2.63* 2.70* 2.50* 2.21*  --  1.97*  CALCIUM 8.1* 7.8*  --  7.7* 7.9*  --  7.9*   GFR: Estimated Creatinine Clearance: 20.3 mL/min (A) (by C-G formula based on SCr of 1.97 mg/dL (H)). Liver Function Tests:  Recent Labs Lab 06/01/2017 1616 05/05/17 0652  AST 19 15  ALT 10* 13*  ALKPHOS 56 60  BILITOT 1.1 0.9  PROT 5.5* 5.3*  ALBUMIN 2.6* 2.1*   No results for input(s): LIPASE, AMYLASE in the last 168 hours. No results for input(s): AMMONIA in the last 168 hours. Coagulation Profile:  Recent Labs Lab 05/22/2017 2127  INR 1.31   Cardiac Enzymes:  Recent Labs Lab 05/08/2017 2127 05/04/17 0314 05/04/17 0802  TROPONINI 0.54* 0.31* 0.33*   BNP (last 3 results)  Recent Labs  04/15/17 1307  PROBNP 3,962.0*   HbA1C: No results for input(s): HGBA1C in the last 72 hours. CBG: No results for input(s): GLUCAP in the last 168 hours. Lipid Profile: No  results for input(s): CHOL, HDL, LDLCALC, TRIG, CHOLHDL, LDLDIRECT in the last 72 hours. Thyroid Function Tests: No results for input(s): TSH, T4TOTAL, FREET4, T3FREE, THYROIDAB in the last 72 hours. Anemia Panel: No results for input(s): VITAMINB12, FOLATE, FERRITIN, TIBC, IRON, RETICCTPCT in the last 72 hours. Urine analysis:    Component Value Date/Time   COLORURINE YELLOW 05/04/2017 0110   APPEARANCEUR CLEAR 05/04/2017 0110   LABSPEC 1.012 05/04/2017 0110   PHURINE 6.0 05/04/2017 0110   GLUCOSEU NEGATIVE 05/04/2017 0110   HGBUR NEGATIVE 05/04/2017 0110   HGBUR trace-intact 06/25/2010 1028   BILIRUBINUR NEGATIVE 05/04/2017 0110   BILIRUBINUR Negative 12/07/2016 1535   KETONESUR NEGATIVE 05/04/2017 0110   PROTEINUR NEGATIVE 05/04/2017 0110   UROBILINOGEN 0.2 12/07/2016 1535   UROBILINOGEN  0.2 06/25/2010 1028   NITRITE NEGATIVE 05/04/2017 0110   LEUKOCYTESUR SMALL (A) 05/04/2017 0110   Sepsis Labs: Invalid input(s): PROCALCITONIN, LACTICIDVEN  Recent Results (from the past 240 hour(s))  Blood Culture (routine x 2)     Status: Abnormal (Preliminary result)   Collection Time: 05/13/2017  4:12 PM  Result Value Ref Range Status   Specimen Description BLOOD RIGHT ANTECUBITAL  Final   Special Requests   Final    BOTTLES DRAWN AEROBIC AND ANAEROBIC Blood Culture adequate volume   Culture  Setup Time   Final    GRAM POSITIVE COCCI IN CLUSTERS IN BOTH AEROBIC AND ANAEROBIC BOTTLES CRITICAL RESULT CALLED TO, READ BACK BY AND VERIFIED WITH: G.ABBOTT PHARMD 05/04/17 0643 L.CHAMPION    Culture (A)  Final    STAPHYLOCOCCUS AUREUS SUSCEPTIBILITIES TO FOLLOW    Report Status PENDING  Incomplete  Blood Culture ID Panel (Reflexed)     Status: Abnormal   Collection Time: 05/27/2017  4:12 PM  Result Value Ref Range Status   Enterococcus species NOT DETECTED NOT DETECTED Final   Listeria monocytogenes NOT DETECTED NOT DETECTED Final   Staphylococcus species DETECTED (A) NOT DETECTED Final     Comment: CRITICAL RESULT CALLED TO, READ BACK BY AND VERIFIED WITH: G.ABBOTT PHARMD 05/04/17 0643 L.CHAMPION    Staphylococcus aureus DETECTED (A) NOT DETECTED Final    Comment: Methicillin (oxacillin)-resistant Staphylococcus aureus (MRSA). MRSA is predictably resistant to beta-lactam antibiotics (except ceftaroline). Preferred therapy is vancomycin unless clinically contraindicated. Patient requires contact precautions if  hospitalized. CRITICAL RESULT CALLED TO, READ BACK BY AND VERIFIED WITH: G.ABBOTT PHARMD 05/04/17 0643 L.CHAMPION    Methicillin resistance DETECTED (A) NOT DETECTED Final    Comment: CRITICAL RESULT CALLED TO, READ BACK BY AND VERIFIED WITH: G.ABBOTT PHARMD 05/04/17 0643 L.CHAMPION    Streptococcus species NOT DETECTED NOT DETECTED Final   Streptococcus agalactiae NOT DETECTED NOT DETECTED Final   Streptococcus pneumoniae NOT DETECTED NOT DETECTED Final   Streptococcus pyogenes NOT DETECTED NOT DETECTED Final   Acinetobacter baumannii NOT DETECTED NOT DETECTED Final   Enterobacteriaceae species NOT DETECTED NOT DETECTED Final   Enterobacter cloacae complex NOT DETECTED NOT DETECTED Final   Escherichia coli NOT DETECTED NOT DETECTED Final   Klebsiella oxytoca NOT DETECTED NOT DETECTED Final   Klebsiella pneumoniae NOT DETECTED NOT DETECTED Final   Proteus species NOT DETECTED NOT DETECTED Final   Serratia marcescens NOT DETECTED NOT DETECTED Final   Haemophilus influenzae NOT DETECTED NOT DETECTED Final   Neisseria meningitidis NOT DETECTED NOT DETECTED Final   Pseudomonas aeruginosa NOT DETECTED NOT DETECTED Final   Candida albicans NOT DETECTED NOT DETECTED Final   Candida glabrata NOT DETECTED NOT DETECTED Final   Candida krusei NOT DETECTED NOT DETECTED Final   Candida parapsilosis NOT DETECTED NOT DETECTED Final   Candida tropicalis NOT DETECTED NOT DETECTED Final  Blood Culture (routine x 2)     Status: Abnormal (Preliminary result)   Collection Time:  05/26/2017  4:25 PM  Result Value Ref Range Status   Specimen Description BLOOD RIGHT HAND  Final   Special Requests   Final    BOTTLES DRAWN AEROBIC AND ANAEROBIC Blood Culture adequate volume   Culture  Setup Time   Final    GRAM POSITIVE COCCI IN CLUSTERS IN BOTH AEROBIC AND ANAEROBIC BOTTLES CRITICAL VALUE NOTED.  VALUE IS CONSISTENT WITH PREVIOUSLY REPORTED AND CALLED VALUE.    Culture (A)  Final    STAPHYLOCOCCUS AUREUS SUSCEPTIBILITIES TO FOLLOW  Report Status PENDING  Incomplete  Urine culture     Status: Abnormal   Collection Time: 05/04/17  1:10 AM  Result Value Ref Range Status   Specimen Description URINE, CLEAN CATCH  Final   Special Requests NONE  Final   Culture <10,000 COLONIES/mL (A)  Final   Report Status 05/05/2017 FINAL  Final  MRSA PCR Screening     Status: Abnormal   Collection Time: 05/04/17  3:07 PM  Result Value Ref Range Status   MRSA by PCR POSITIVE (A) NEGATIVE Final    Comment:        The GeneXpert MRSA Assay (FDA approved for NASAL specimens only), is one component of a comprehensive MRSA colonization surveillance program. It is not intended to diagnose MRSA infection nor to guide or monitor treatment for MRSA infections. RESULT CALLED TO, READ BACK BY AND VERIFIED WITH: Candiss Norse RN 17:40 05/04/17 ([email or web address])       Radiology Studies: Dg Chest Port 1 View  Result Date: 05/27/2017 CLINICAL DATA:  Severe dyspnea EXAM: PORTABLE CHEST 1 VIEW COMPARISON:  04/30/2017 chest radiograph. FINDINGS: Low lung volumes. Stable cardiomediastinal silhouette with mild cardiomegaly and aortic atherosclerosis. No pneumothorax. Small right pleural effusion appears new. No left pleural effusion. Hazy and linear parahilar lung opacities, asymmetric to the right, favor mild pulmonary edema. Mild bibasilar atelectasis. IMPRESSION: 1. Stable cardiomegaly with hazy and linear parahilar lung opacities asymmetric to the right, favor mild pulmonary edema due to mild  congestive heart failure. 2. Small right pleural effusion. 3. Low lung volumes with mild bibasilar atelectasis. Electronically Signed   By: Ilona Sorrel M.D.   On: 05/20/2017 17:14     Scheduled Meds: . aspirin EC  81 mg Oral Daily  . calcitonin (salmon)  1 spray Alternating Nares Daily  . Chlorhexidine Gluconate Cloth  6 each Topical Q0600  . heparin  5,000 Units Subcutaneous Q8H  . levothyroxine  25 mcg Oral QAC breakfast  . mouth rinse  15 mL Mouth Rinse BID  . mupirocin ointment  1 application Nasal BID  . senna-docusate  1 tablet Oral BID   Continuous Infusions: . vancomycin      Marzetta Board, MD, PhD [facility] Pager [phone]  If 7PM-7AM, please contact night-coverage [email or web address] Password [email or web address] 05/05/2017, 10:58 AM

## 2017-05-05 NOTE — Evaluation (Signed)
Clinical/Bedside Swallow Evaluation Patient Details  Name: Kimberly [name] MRN: [id number] Date of Birth: 06-19-1928  Today's Date: 05/05/2017 Time: SLP Start Time (ACUTE ONLY): 8416 SLP Stop Time (ACUTE ONLY): 0956 SLP Time Calculation (min) (ACUTE ONLY): 18 min  Past Medical History:  Past Medical History:  Diagnosis Date  . Allergy    allergic rhinitis  . Chronic combined systolic and diastolic CHF (congestive heart failure) ([place])    a. 01/2014 Echo: EF 50-55%, no rwma, Gr1 DD, triv AI, mild MR;  b. 01/2017 Echo: EF 35%, diff HK, gr2  DD, fibrocalcific AoV w/o AS, mild AI/MR, sev dil LA, PASP 35mmHg.  . Fracture, humerus 04/2017  . GERD (gastroesophageal reflux disease)   . Hyperkalemia   . Hyperlipidemia   . Hypertension   . Hyponatremia   . Hypothyroid   . Hypoxia 06/01/2017  . Insomnia   . Non-obstructive CAD (coronary artery disease)    a. Non-obstructive dzs by caths in 2001 and 2006;  b. 03/2017 Cath: LM nl, LAD 30p, LCX nl, RCA 83m/d, EF 45-50%.  . Sepsis ([place]) 05/24/2017   Past Surgical History:  Past Surgical History:  Procedure Laterality Date  . CARDIAC CATHETERIZATION     [place]     [facility]  . CATARACT EXTRACTION    . LEFT HEART CATH AND CORONARY ANGIOGRAPHY N/A 03/03/2017   Procedure: Left Heart Cath and Coronary Angiography;  Surgeon: Minna Merritts, MD;  Location: [facility];  Service: Cardiovascular;  Laterality: N/A;  . OOPHORECTOMY    . THYROID SURGERY     HPI:  Pt is an 83 year old lady with prior h/o chronic hyponatremia, hypertension, CAD, GERD, Dementia, chronic systolic and diastolic heart failure recently discharged from [facility], who comes in for sepsis and acute respiratory failure with hypoxia, possible from mild pulmonary edema. BiPAP required initially.   Assessment / Plan / Recommendation Clinical Impression  Pt's ability to take in POs is limited by her mentation and current lethargy. RN does report that she took meds  orally this morning without overt difficulty, but at this time it takes Max cues for her to consume minimal water and applesauce. When she does accept them orally, she has no overt s/s of aspiration and her oral cavity is cleared well. Recommend leaving in current diet so that pt may have sips of clears when she is optimally alert. Daughter at bedside was educated about the need to hold POs unless she is alert and accepting. Will f/u for readiness to advance diet as mentation improves. Would provide meds in puree until that time. SLP Visit Diagnosis: Dysphagia, oral phase (R13.11)    Aspiration Risk  Moderate aspiration risk    Diet Recommendation Thin liquid (when fully alert)   Liquid Administration via: Straw Medication Administration: Whole meds with puree (crush if needed) Supervision: Full supervision/cueing for compensatory strategies;Staff to assist with self feeding Compensations: Slow rate;Small sips/bites Postural Changes: Seated upright at 90 degrees    Other  Recommendations Oral Care Recommendations: Oral care QID Other Recommendations: Have oral suction available   Follow up Recommendations Skilled Nursing facility      Frequency and Duration min 2x/week  2 weeks       Prognosis Prognosis for Safe Diet Advancement: Good Barriers to Reach Goals: Cognitive deficits      Swallow Study   General HPI: Pt is an 83 year old lady with prior h/o chronic hyponatremia, hypertension, CAD, GERD, Dementia, chronic systolic and  diastolic heart failure recently discharged from [facility], who comes in for sepsis and acute respiratory failure with hypoxia, possible from mild pulmonary edema. BiPAP required initially. Type of Study: Bedside Swallow Evaluation Previous Swallow Assessment: none in chart Diet Prior to this Study: Thin liquids Temperature Spikes Noted: Yes (100.1) Respiratory Status: Nasal cannula History of Recent Intubation: No Behavior/Cognition:  Lethargic/Drowsy;Requires cueing Oral Cavity Assessment: Within Functional Limits Oral Care Completed by SLP: Recent completion by staff Oral Cavity - Dentition: Dentures, top;Dentures, bottom Vision:  (mostly keeps eyes closed) Self-Feeding Abilities: Total assist Patient Positioning: Upright in bed Baseline Vocal Quality: Normal Volitional Cough: Cognitively unable to elicit Volitional Swallow: Able to elicit    Oral/Motor/Sensory Function Overall Oral Motor/Sensory Function: Generalized oral weakness   Ice Chips Ice chips: Impaired Presentation: Spoon Oral Phase Impairments: Other (comment) (no oral acceptance)   Thin Liquid Thin Liquid: Impaired Presentation: Straw Oral Phase Impairments: Poor awareness of bolus    Nectar Thick Nectar Thick Liquid: Not tested   Honey Thick Honey Thick Liquid: Not tested   Puree Puree: Impaired Presentation: Spoon Oral Phase Impairments: Poor awareness of bolus Oral Phase Functional Implications: Prolonged oral transit   Solid   GO   Solid: Not tested        Germain Osgood 05/05/2017,10:05 AM   Germain Osgood, M.A. CCC-SLP [phone]

## 2017-05-05 NOTE — Progress Notes (Signed)
12 lead EKG done, showing new a fib RVR. Schorr, NP notified, orders placed.

## 2017-05-05 NOTE — Progress Notes (Signed)
Kimberly [name] for Infectious Disease  Date of Admission:  05/31/2017     Total days of antibiotics 3  Vancomycin 05/26/2017 -              Aztreonam 05/06/2017 - 05/04/17         ASSESSMENT: MRSA Bacteremia  BCx 05/20/17 2/2 +MRSA  BCx 05/05/17 - pending  CHF Exacerbation/Respiratory Failure  BiPAP QHS and PRN - tolerating nasal cannula well   Left humerus Fx   Very tender distally at wrist  Swollen and decreased radial pulse compared to right   AKI   Creatinine 2.7 > 1.97   PLAN:  1. Continue Vancomycin and await repeat cultures.  2. LUE is very tender down at her wrist/forearm. Very swollen and pulse decreased. Only imaging done to humerus - I worry she has a fracture of the lower arm with known fall at home. May be a good idea to consider films of lower left arm/wrist as well as doppler to r/o DVT.  3. TTE to be done today - will await.  4. Do not place PICC line yet please.  5. Creatinine improving - continue to watch closely on Vanc.   Kimberly Madeira, MSN, NP-C [facility] [place] [facility] Pager: [phone]  05/05/2017  9:51 AM     Principal Problem:   Sepsis ([place]) Active Problems:   Anxiety   Essential hypertension   LBBB (left bundle branch block)   Frequent UTI   Obesity   Hyponatremia   Chronic systolic heart failure ([facility])   Hyperkalemia   Acute hypoxemic respiratory failure ([facility])   Septic shock ([facility])   MRSA bacteremia   Fracture of anatomical neck of humerus, right, closed, initial encounter   Stable burst fracture of T10 vertebra with delayed healing   . aspirin EC  81 mg Oral Daily  . calcitonin (salmon)  1 spray Alternating Nares Daily  . Chlorhexidine Gluconate Cloth  6 each Topical Q0600  . heparin  5,000 Units Subcutaneous Q8H  . levothyroxine  25 mcg Oral QAC breakfast  . mouth rinse  15 mL Mouth Rinse BID  . mupirocin ointment  1 application Nasal BID  . senna-docusate  1 tablet Oral BID     SUBJECTIVE: Interval History - WBC improved. No further fevers however temp was 96.7 deg this AM. Now with AFib with RVR with rate 110 - 120s. Echo not done yet. Repeat BCx drawn this AM and pending. Kimberly [name] tells me she is feeling OK but having some diarrhea (got something to make her go yesterday).  Daughter Kimberly [name] at the bedside. Reports she seems more confused.   Review of Systems: Review of Systems  Constitutional: Negative for chills and fever.  Respiratory: Negative for cough and shortness of breath.   Cardiovascular: Negative for chest pain.  Gastrointestinal: Positive for diarrhea (in setting of laxative use). Negative for abdominal pain and nausea.  Musculoskeletal: Positive for joint pain (RUE from shoulder to wrist ).  Neurological: Negative for headaches.    Allergies  Allergen Reactions  . Ciprofloxacin Other (See Comments)    REACTION: burning of mouth and skin  . Lisinopril Cough  . Remeron [Mirtazapine] Other (See Comments)    constipation  . Sulfonamide Derivatives Other (See Comments)    REACTION: malaise  . Tetanus Toxoid Swelling  . Vesicare [Solifenacin Succinate] Other (See Comments)    Blurred vision and weakness  . Zoloft [Sertraline Hcl] Nausea And  Vomiting  . Cephalexin Rash and Other (See Comments)    REACTION: rash and burning sensation to skin on arms and legs with itching.  Madelin Headings [Amitriptyline] Palpitations  . Penicillins Rash and Other (See Comments)    Has patient had a PCN reaction causing immediate rash, facial/tongue/throat swelling, SOB or lightheadedness with hypotension: Unknown Has patient had a PCN reaction causing severe rash involving mucus membranes or skin necrosis: Unknown Has patient had a PCN reaction that required hospitalization: Unknown Has patient had a PCN reaction occurring within the last 10 years: No If all of the above answers are "NO", then may proceed with Cephalosporin use. Has since take Augmentin with no problem     OBJECTIVE: Vitals:   05/05/17 0653 05/05/17 0726 05/05/17 0751 05/05/17 0753  BP:  106/72 106/72   Pulse: (!) 114 (!) 108 (!) 120   Resp: 18 19 16    Temp:  (!) 96.7 F (35.9 C)    TempSrc:  Axillary    SpO2: 98% 98% 97% 96%  Weight:      Height:       Body mass index is 34.2 kg/m.  Physical Exam  Constitutional:  Lying in bed on nasal cannula with eyes closed. Awakens easily.   Cardiovascular: S1 normal, S2 normal and normal heart sounds.  An irregularly irregular rhythm present. Tachycardia present.   No murmur heard. Pulses:      Radial pulses are 3+ on the right side, and 1+ on the left side.  Pulmonary/Chest: Effort normal and breath sounds normal. No respiratory distress. She has no rales.  Musculoskeletal: She exhibits edema.       Left wrist: She exhibits tenderness and swelling. She exhibits normal range of motion.     Neurological: She is alert.  Skin: Skin is warm and dry. No rash noted.    Lab Results Lab Results  Component Value Date   WBC 15.1 (H) 05/05/2017   HGB 8.1 (L) 05/05/2017   HCT 25.5 (L) 05/05/2017   MCV 85.3 05/05/2017   PLT 203 05/05/2017    Lab Results  Component Value Date   CREATININE 1.97 (H) 05/05/2017   BUN 58 (H) 05/05/2017   NA 124 (L) 05/05/2017   K 5.1 05/05/2017   CL 89 (L) 05/05/2017   CO2 24 05/05/2017    Lab Results  Component Value Date   ALT 13 (L) 05/05/2017   AST 15 05/05/2017   ALKPHOS 60 05/05/2017   BILITOT 0.9 05/05/2017     Microbiology: Recent Results (from the past 240 hour(s))  Blood Culture (routine x 2)     Status: None (Preliminary result)   Collection Time: 05/06/2017  4:12 PM  Result Value Ref Range Status   Specimen Description BLOOD RIGHT ANTECUBITAL  Final   Special Requests   Final    BOTTLES DRAWN AEROBIC AND ANAEROBIC Blood Culture adequate volume   Culture  Setup Time   Final    GRAM POSITIVE COCCI IN CLUSTERS IN BOTH AEROBIC AND ANAEROBIC BOTTLES CRITICAL RESULT CALLED TO, READ BACK  BY AND VERIFIED WITH: G.ABBOTT PHARMD 05/04/17 0643 L.CHAMPION    Culture GRAM POSITIVE COCCI IN CLUSTERS  Final   Report Status PENDING  Incomplete  Blood Culture ID Panel (Reflexed)     Status: Abnormal   Collection Time: 05/09/2017  4:12 PM  Result Value Ref Range Status   Enterococcus species NOT DETECTED NOT DETECTED Final   Listeria monocytogenes NOT DETECTED NOT DETECTED Final   Staphylococcus species  DETECTED (A) NOT DETECTED Final    Comment: CRITICAL RESULT CALLED TO, READ BACK BY AND VERIFIED WITH: G.ABBOTT PHARMD 05/04/17 0643 L.CHAMPION    Staphylococcus aureus DETECTED (A) NOT DETECTED Final    Comment: Methicillin (oxacillin)-resistant Staphylococcus aureus (MRSA). MRSA is predictably resistant to beta-lactam antibiotics (except ceftaroline). Preferred therapy is vancomycin unless clinically contraindicated. Patient requires contact precautions if  hospitalized. CRITICAL RESULT CALLED TO, READ BACK BY AND VERIFIED WITH: G.ABBOTT PHARMD 05/04/17 0643 L.CHAMPION    Methicillin resistance DETECTED (A) NOT DETECTED Final    Comment: CRITICAL RESULT CALLED TO, READ BACK BY AND VERIFIED WITH: G.ABBOTT PHARMD 05/04/17 0643 L.CHAMPION    Streptococcus species NOT DETECTED NOT DETECTED Final   Streptococcus agalactiae NOT DETECTED NOT DETECTED Final   Streptococcus pneumoniae NOT DETECTED NOT DETECTED Final   Streptococcus pyogenes NOT DETECTED NOT DETECTED Final   Acinetobacter baumannii NOT DETECTED NOT DETECTED Final   Enterobacteriaceae species NOT DETECTED NOT DETECTED Final   Enterobacter cloacae complex NOT DETECTED NOT DETECTED Final   Escherichia coli NOT DETECTED NOT DETECTED Final   Klebsiella oxytoca NOT DETECTED NOT DETECTED Final   Klebsiella pneumoniae NOT DETECTED NOT DETECTED Final   Proteus species NOT DETECTED NOT DETECTED Final   Serratia marcescens NOT DETECTED NOT DETECTED Final   Haemophilus influenzae NOT DETECTED NOT DETECTED Final   Neisseria meningitidis  NOT DETECTED NOT DETECTED Final   Pseudomonas aeruginosa NOT DETECTED NOT DETECTED Final   Candida albicans NOT DETECTED NOT DETECTED Final   Candida glabrata NOT DETECTED NOT DETECTED Final   Candida krusei NOT DETECTED NOT DETECTED Final   Candida parapsilosis NOT DETECTED NOT DETECTED Final   Candida tropicalis NOT DETECTED NOT DETECTED Final  Blood Culture (routine x 2)     Status: None (Preliminary result)   Collection Time: 05/10/2017  4:25 PM  Result Value Ref Range Status   Specimen Description BLOOD RIGHT HAND  Final   Special Requests   Final    BOTTLES DRAWN AEROBIC AND ANAEROBIC Blood Culture adequate volume   Culture  Setup Time   Final    GRAM POSITIVE COCCI IN CLUSTERS IN BOTH AEROBIC AND ANAEROBIC BOTTLES CRITICAL VALUE NOTED.  VALUE IS CONSISTENT WITH PREVIOUSLY REPORTED AND CALLED VALUE.    Culture GRAM POSITIVE COCCI IN CLUSTERS  Final   Report Status PENDING  Incomplete  Urine culture     Status: Abnormal   Collection Time: 05/04/17  1:10 AM  Result Value Ref Range Status   Specimen Description URINE, CLEAN CATCH  Final   Special Requests NONE  Final   Culture <10,000 COLONIES/mL (A)  Final   Report Status 05/05/2017 FINAL  Final  MRSA PCR Screening     Status: Abnormal   Collection Time: 05/04/17  3:07 PM  Result Value Ref Range Status   MRSA by PCR POSITIVE (A) NEGATIVE Final    Comment:        The GeneXpert MRSA Assay (FDA approved for NASAL specimens only), is one component of a comprehensive MRSA colonization surveillance program. It is not intended to diagnose MRSA infection nor to guide or monitor treatment for MRSA infections. RESULT CALLED TO, READ BACK BY AND VERIFIED WITH: Candiss Norse RN 17:40 05/04/17 ([email or web address])     Kimberly Madeira, MSN, NP-C [facility] [place] Pager: [phone]  05/05/2017 9:28 AM

## 2017-05-05 NOTE — Consult Note (Signed)
Cardiology Consult    Patient ID: Kimberly [name]; [id number]; 1928/04/15   Admit date: 05/04/2017 Date of Consult: 05/05/2017  Primary Care Provider: Abner Greenspan, MD Primary Cardiologist: Dr. Rockey Situ   Patient Profile    Kimberly [name] is a 83 y.o. female with past medical history of CAD (nonobstructive CAD by cath in 03/2017), chronic combined systolic and diastolic CHF (EF 93% by echo in 01/2017), HTN, HLD, and LBBB who is being seen today for the evaluation of atrial fibrillation at the request of Dr. Cruzita Lederer.   History of Present Illness    Ms. Honeycutt was last evaluated by Ignacia Bayley, NP in 03/2017 and reported doing well from a cardiac perspective at that time with a stable weight of 170 lbs. She was continued on her current medication regimen of ASA, Coreg 3.125mg  BID, Lasix 20mg  daily, Losartan 25mg  daily, and Spironolactone 25mg  daily.   She was admitted from 9/25 - 05/01/2017 for evaluation of a recent fall and was found to have a left humerus fracture. She was significantly hyponatremic on admission with Na+ of 113 on admission but this was improved to 126 at the time of discharge. She was discharged to [facility] but returned to [facility] ED on 05/27/2017 for worsening lethargy and dyspnea. Oxygen saturations were in the 70's initially and improved with placement of NRB.   While in the ED, she was noted to be febrile with a temp > 101, therefore CODE SEPSIS was activated and she was started on broad-spectrum antibiotics. Blood cultures have resulted and are positive for MRSA bacteremia. ID is following.   Overnight, she was found to have gone into atrial fibrillation with RVR. Was given IV Cardizem and IV Lopressor for one dose with improvement in her HR. HR now in the low-100's to 120's. She notes palpitations and continued dyspnea. No chest discomfort. Her daughter who is at the bedside reports she is usually more active and previously lived at home independently prior to her  recent hospitalization.   Past Medical History   Past Medical History:  Diagnosis Date  . Allergy    allergic rhinitis  . Chronic combined systolic and diastolic CHF (congestive heart failure) ([place])    a. 01/2014 Echo: EF 50-55%, no rwma, Gr1 DD, triv AI, mild MR;  b. 01/2017 Echo: EF 35%, diff HK, gr2  DD, fibrocalcific AoV w/o AS, mild AI/MR, sev dil LA, PASP 35mmHg.  . Fracture, humerus 04/2017  . GERD (gastroesophageal reflux disease)   . Hyperkalemia   . Hyperlipidemia   . Hypertension   . Hyponatremia   . Hypothyroid   . Hypoxia 05/27/2017  . Insomnia   . Non-obstructive CAD (coronary artery disease)    a. Non-obstructive dzs by caths in 2001 and 2006;  b. 03/2017 Cath: LM nl, LAD 30p, LCX nl, RCA 64m/d, EF 45-50%.  . Sepsis ([place]) 05/31/2017     Allergies:   Allergies  Allergen Reactions  . Ciprofloxacin Other (See Comments)    REACTION: burning of mouth and skin  . Lisinopril Cough  . Remeron [Mirtazapine] Other (See Comments)    constipation  . Sulfonamide Derivatives Other (See Comments)    REACTION: malaise  . Tetanus Toxoid Swelling  . Vesicare [Solifenacin Succinate] Other (See Comments)    Blurred vision and weakness  . Zoloft [Sertraline Hcl] Nausea And Vomiting  . Cephalexin Rash and Other (See Comments)    REACTION: rash and burning sensation to skin on arms and legs  with itching.  Madelin Headings [Amitriptyline] Palpitations  . Penicillins Rash and Other (See Comments)    Has patient had a PCN reaction causing immediate rash, facial/tongue/throat swelling, SOB or lightheadedness with hypotension: Unknown Has patient had a PCN reaction causing severe rash involving mucus membranes or skin necrosis: Unknown Has patient had a PCN reaction that required hospitalization: Unknown Has patient had a PCN reaction occurring within the last 10 years: No If all of the above answers are "NO", then may proceed with Cephalosporin use. Has since take Augmentin with no problem     Home Medications:   Home Medications:  Prior to Admission medications   Medication Sig Start Date End Date Taking? Authorizing Provider  aspirin EC 81 MG tablet Take 81 mg by mouth daily.   Yes [provider]  calcitonin, salmon, (MIACALCIN/FORTICAL) 200 UNIT/ACT nasal spray Place 1 spray into alternate nostrils daily.   Yes [provider]  carvedilol (COREG) 3.125 MG tablet Take 1 tablet (3.125 mg total) by mouth 2 (two) times daily with a meal. Please keep 05/20/17 appt for future refills 04/27/17  Yes Gollan, Kathlene November, MD  cetirizine (ZYRTEC) 10 MG tablet Take 10 mg by mouth daily as needed for allergies.    Yes [provider]  docusate sodium 100 MG CAPS Take 100 mg by mouth 2 (two) times daily. For constipation 02/02/14  Yes Rai, Ripudeep K, MD  esomeprazole (NEXIUM) 40 MG capsule TAKE 1 CAPSULE (40 MG TOTAL) BY MOUTH DAILY. 10/12/16  Yes Tower, Wynelle Fanny, MD  fluticasone (FLONASE) 50 MCG/ACT nasal spray Place 2 sprays into both nostrils daily. 04/15/17  Yes Ria Bush, MD  furosemide (LASIX) 40 MG tablet Take 1 tablet (40 mg total) by mouth 2 (two) times daily. Patient taking differently: Take 40 mg by mouth See admin instructions. Take 1 tablet (40 mg) by mouth twice daily - at 8:30am and 1:30pm 05/01/17  Yes Sudini, Alveta Heimlich, MD  guaiFENesin (MUCINEX) 600 MG 12 hr tablet Take 600 mg by mouth 2 (two) times daily as needed for cough or to loosen phlegm.    Yes [provider]  levothyroxine (SYNTHROID, LEVOTHROID) 25 MCG tablet Take 1 tablet (25 mcg total) by mouth daily. Patient taking differently: Take 25 mcg by mouth daily before breakfast.  06/16/16  Yes Tower, Wynelle Fanny, MD  losartan (COZAAR) 25 MG tablet TAKE 1 TABLET BY MOUTH EVERY DAY Patient taking differently: TAKE 1 TABLET (25 MG) BY MOUTH EVERY DAY 03/25/17  Yes Gollan, Kathlene November, MD  Multiple Vitamin (MULTIVITAMIN WITH MINERALS) TABS tablet Take 1 tablet by mouth daily.   Yes [provider]  oxyCODONE (OXY IR/ROXICODONE) 5 MG immediate release tablet Take 1 tablet (5 mg total) by mouth every 6 (six) hours as needed for moderate pain or severe pain. Patient taking differently: Take 5 mg by mouth every 4 (four) hours as needed for severe pain.  05/01/17  Yes Sudini, Alveta Heimlich, MD  potassium chloride (K-DUR) 10 MEQ tablet Take 1 tablet (10 mEq total) by mouth daily. 05/01/17  Yes Sudini, Alveta Heimlich, MD  zolpidem (AMBIEN) 10 MG tablet TAKE 1/2 TO 1 TABLET BY MOUTH DAILY AT BEDTIME AS NEEDED Patient taking differently: TAKE 1/2 TO 1 TABLET (5-10 MG) BY MOUTH DAILY AT BEDTIME AS NEEDED FOR SLEEP 02/18/17  Yes Tower, Wynelle Fanny, MD    Inpatient Medications    Scheduled Meds: . aspirin EC  81 mg Oral Daily  . calcitonin (salmon)  1 spray Alternating Nares Daily  .  Chlorhexidine Gluconate Cloth  6 each Topical Q0600  . heparin  5,000 Units Subcutaneous Q8H  . levothyroxine  25 mcg Oral QAC breakfast  . mouth rinse  15 mL Mouth Rinse BID  . metoprolol tartrate  2.5 mg Intravenous Q6H  . mupirocin ointment  1 application Nasal BID  . senna-docusate  1 tablet Oral BID   Continuous Infusions: . vancomycin     PRN Meds: ondansetron **OR** ondansetron (ZOFRAN) IV, oxyCODONE, polyethylene glycol  Family History    Family History  Problem Relation Age of Onset  . Heart disease Mother   . Hypertension Mother     Social History    Social History   Social History  . Marital status: Widowed    Spouse name: N/A  . Number of children: N/A  . Years of education: N/A   Occupational History  . Not on file.   Social History Main Topics  . Smoking status: Never Smoker  . Smokeless tobacco: Never Used  . Alcohol use No  . Drug use: No  . Sexual activity: No   Other Topics Concern  . Not on file   Social History Narrative  . No narrative on file     Review of Systems    General:  No chills, fever, night sweats or weight changes.  Cardiovascular:  No chest pain,  edema, orthopnea, paroxysmal nocturnal dyspnea. Positive for dyspnea on exertion and palpitations.  Dermatological: No rash, lesions/masses Respiratory: No cough, dyspnea Urologic: No hematuria, dysuria Abdominal:   No nausea, vomiting, diarrhea, bright red blood per rectum, melena, or hematemesis Neurologic:  No visual changes, Positive for wkns and changes in mental status. All other systems reviewed and are otherwise negative except as noted above.  Physical Exam/Data    Blood pressure (!) 97/46, pulse (!) 38, temperature 98.5 F (36.9 C), temperature source Oral, resp. rate (!) 22, height 5\' 2"  (1.575 m), weight 187 lb (84.8 kg), last menstrual period 08/03/1978, SpO2 93 %.  General: Pleasant, ill-appearing elderly Caucasian female.  Psych: Normal affect. Neuro: Alert and oriented X 3. Moves all extremities spontaneously. HEENT: Normal  Neck: Supple without bruits. JVD at 9cm. Lungs:  Resp regular and unlabored, rales along bases bilaterally. Heart: Irregularly irregular no s3, s4, or murmurs. Abdomen: Soft, non-tender, non-distended, BS + x 4.  Extremities: No clubbing or cyanosis. Trace lower extremity edema. DP/PT/Radials 2+ and equal bilaterally.   EKG:  The EKG was personally reviewed and demonstrates:  Atrial fibrillation with RVR, HR 118, with known LBBB.   Labs/Studies     Relevant CV Studies:  Echocardiogram: 01/2017 Study Conclusions  - Left ventricle: The cavity size was severely dilated. There was   mild concentric hypertrophy. Systolic function was moderately to   severely reduced. The estimated ejection fraction was 35%.   Diffuse hypokinesis. - Aortic valve: There was mild regurgitation. - Mitral valve: There was mild regurgitation. - Pulmonary arteries: PA peak pressure: 36 mm Hg (S).  Impressions:  - The right ventricular systolic pressure was increased consistent   with mild pulmonary hypertension. Severely dilated left atrium   and left  ventricle with severe left ventricular systolic   dysfunction] LVEF 33-35%, with diffuse hypokinesis. Severe mitral   annular calcification with mild to moderate mitral regurgitation   and mild pulmonary hypertension. Fibrocalcific 5 aortic valve   without any significant aortic stenosis but mild aortic   regurgitation. There is grade 2 diastolic dysfunction.   Cardiac Catheterization: 03/03/2017  Mid RCA to [facility]  RCA lesion, 30 %stenosed.  The left ventricular ejection fraction is 45-50% by visual estimate.  There is no mitral valve regurgitation.  There is mild left ventricular systolic dysfunction.  LV end diastolic pressure is normal.  Prox LAD lesion, 30 %stenosed.    Laboratory Data:  Chemistry  Recent Labs Lab 05/04/2017 2127 05/04/17 0802 05/04/17 2049 05/05/17 0652  NA 119* 124*  --  124*  K 5.6* 5.9* 5.5* 5.1  CL 88* 90*  --  89*  CO2 23 19*  --  24  GLUCOSE 151* 133*  --  103*  BUN 44* 44*  --  58*  CREATININE 2.50* 2.21*  --  1.97*  CALCIUM 7.7* 7.9*  --  7.9*  GFRNONAA 16* 19*  --  22*  GFRAA 19* 22*  --  25*  ANIONGAP 8 15  --  11     Recent Labs Lab 05/22/2017 1616 05/05/17 0652  PROT 5.5* 5.3*  ALBUMIN 2.6* 2.1*  AST 19 15  ALT 10* 13*  ALKPHOS 56 60  BILITOT 1.1 0.9   Hematology  Recent Labs Lab 05/04/2017 2127 05/04/17 0802 05/05/17 0652  WBC 21.9* 20.6* 15.1*  RBC 3.12* 3.43* 2.99*  HGB 8.7* 9.6* 8.1*  HCT 26.9* 29.6* 25.5*  MCV 86.2 86.3 85.3  MCH 27.9 28.0 27.1  MCHC 32.3 32.4 31.8  RDW 14.0 14.1 14.3  PLT 193 175 203   Cardiac Enzymes  Recent Labs Lab 05/26/2017 2127 05/04/17 0314 05/04/17 0802  TROPONINI 0.54* 0.31* 0.33*   No results for input(s): TROPIPOC in the last 168 hours.  BNPNo results for input(s): BNP, PROBNP in the last 168 hours.  DDimer No results for input(s): DDIMER in the last 168 hours.  Radiology/Studies:  Dg Chest Port 1 View  Result Date: 05/06/2017 CLINICAL DATA:  Severe dyspnea EXAM:  PORTABLE CHEST 1 VIEW COMPARISON:  04/30/2017 chest radiograph. FINDINGS: Low lung volumes. Stable cardiomediastinal silhouette with mild cardiomegaly and aortic atherosclerosis. No pneumothorax. Small right pleural effusion appears new. No left pleural effusion. Hazy and linear parahilar lung opacities, asymmetric to the right, favor mild pulmonary edema. Mild bibasilar atelectasis. IMPRESSION: 1. Stable cardiomegaly with hazy and linear parahilar lung opacities asymmetric to the right, favor mild pulmonary edema due to mild congestive heart failure. 2. Small right pleural effusion. 3. Low lung volumes with mild bibasilar atelectasis. Electronically Signed   By: Ilona Sorrel M.D.   On: 05/30/2017 17:14    Assessment & Plan   1. New-onset Atrial Fibrillation - admitted for sepsis and found to have MRSA bacteremia. She went into atrial fibrillation with RVR at 0330 today with HR initially in the 140's, improving to the low-100's with IV Lopressor and IV Cardizem. Arrhythmia likely triggered by her acute illness.  - K+ 5.1 this AM. Na+ 124. TSH WNL on 9/25. Will recheck Mg.  - she was on Coreg 3.125mg  BID prior to admission but this has been held secondary to hypotension. Mental status has been variable this admission. Will put in an order for IV Lopressor 2.5mg  Q6H with hold parameters in place. Can switch to PO once mental status improves.  - This patients CHA2DS2-VASc Score and unadjusted Ischemic Stroke Rate (% per year) is equal to 9.7 % stroke rate/year from a score of 6 (CHF, HTN, Coronary Calcifications, Female, Age (2)). Not an ideal anticoagulation candidate with her acute illness, anemia (Hgb dropping to 8.1 this AM), and frequent falls. Could consider pending improvement of her current state. Continue ASA 81mg   daily.   2. CAD/ Elevated Troponin  - she had nonobstructive CAD by cath in 89/1694.  - cyclic troponin values have been flat at 0.51, 0.31, and 0.33. Likely secondary to demand ischemia  in the setting of her acute illness. No plans for further ischemic evaluation with her recent cath showing minimal CAD.   3. Chronic combined systolic and diastolic CHF - EF 50% by echo in 01/2017. Repeat echocardiogram is being performed today to evaluate for endocarditis.  - baseline weight is approximately 170 lbs, up to 187 lbs today.  - was above her baseline weight prior to admission and received additional fluids due to her sepsis. Will require IV diuresis prior to discharge.  - PTA Spironolactone and Losartan held secondary to hypotension and AKI. Restart BB as above.   4. Acute on Chronic Stage 3 CKD - baseline creatinine 1.0-1.1. - creatinine elevated to 2.63 on admission, improved to 1.97 today.   5. HTN - actually hypotensive this admission. Starting IV Lopressor as above. Hold parameters in place.   6. MRSA bacteremia - ID is following.  - TTE scheduled for later today to assess for endocarditis.   7. Hyponatremia/ Hyperkalemia - Na+ 119 on admission, improved to 124 today.  - K+ 6.3 on admission, at 5.1 today.  - repeat BMET in AM.   Signed, Erma Heritage, PA-C 05/05/2017, 2:44 PM Pager: [phone]  I have seen and examined the patient along with Erma Heritage, PA-C.  I have reviewed the chart, notes and new data.  I agree with PA/NP's note.  Key new complaints: left wrist is very tender, sleepy, but easily awoken, oriented Key examination changes: irregular rhythm, paradoxically split S2, no murmur Key new findings / data: chronic LBBB; Afib w RVR; MRSA bacteremia; severe, but improving Na; moderate normocytic anemia  PLAN: Increase metoprolol for better rate control. If TTE clearly shows vegetation and no severe valvular abnormality, can forego TEE. Unfortunately, for several reasons (unexplained anemia, acute renal failure, frequent falls) she is a poor candidate for anticoagulation, despite increased embolic stroke risk.  Sanda Klein, MD,  [place] [phone] 05/05/2017, 3:53 PM

## 2017-05-06 ENCOUNTER — Ambulatory Visit: Payer: Medicare Other | Admitting: Family

## 2017-05-06 ENCOUNTER — Inpatient Hospital Stay (HOSPITAL_COMMUNITY): Payer: Medicare Other

## 2017-05-06 DIAGNOSIS — N179 Acute kidney failure, unspecified: Secondary | ICD-10-CM

## 2017-05-06 DIAGNOSIS — R7881 Bacteremia: Secondary | ICD-10-CM

## 2017-05-06 DIAGNOSIS — N183 Chronic kidney disease, stage 3 (moderate): Secondary | ICD-10-CM

## 2017-05-06 DIAGNOSIS — I5043 Acute on chronic combined systolic (congestive) and diastolic (congestive) heart failure: Secondary | ICD-10-CM

## 2017-05-06 DIAGNOSIS — I481 Persistent atrial fibrillation: Secondary | ICD-10-CM

## 2017-05-06 DIAGNOSIS — R6521 Severe sepsis with septic shock: Secondary | ICD-10-CM

## 2017-05-06 LAB — CULTURE, BLOOD (ROUTINE X 2)
SPECIAL REQUESTS: ADEQUATE
SPECIAL REQUESTS: ADEQUATE
SPECIAL REQUESTS: ADEQUATE
Special Requests: ADEQUATE

## 2017-05-06 LAB — ECHOCARDIOGRAM COMPLETE
FS: 14 % — AB (ref 28–44)
Height: 62 in
IV/PV OW: 1
LA ID, A-P, ES: 53 mm
LA diam index: 2.77 cm/m2
LA vol A4C: 80.9 ml
LA vol index: 42.9 mL/m2
LA vol: 81.9 mL
LDCA: 2.27 cm2
LEFT ATRIUM END SYS DIAM: 53 mm
LVOT diameter: 17 mm
PW: 9.77 mm — AB (ref 0.6–1.1)
Weight: 3174.62 oz

## 2017-05-06 LAB — BASIC METABOLIC PANEL
ANION GAP: 13 (ref 5–15)
BUN: 72 mg/dL — ABNORMAL HIGH (ref 6–20)
CALCIUM: 7.6 mg/dL — AB (ref 8.9–10.3)
CO2: 23 mmol/L (ref 22–32)
CREATININE: 2.49 mg/dL — AB (ref 0.44–1.00)
Chloride: 89 mmol/L — ABNORMAL LOW (ref 101–111)
GFR, EST AFRICAN AMERICAN: 19 mL/min — AB (ref >60)
GFR, EST NON AFRICAN AMERICAN: 16 mL/min — AB (ref >60)
GLUCOSE: 105 mg/dL — AB (ref 65–99)
Potassium: 4.7 mmol/L (ref 3.5–5.1)
Sodium: 125 mmol/L — ABNORMAL LOW (ref 135–145)

## 2017-05-06 LAB — CBC
HCT: 22.4 % — ABNORMAL LOW (ref 36.0–46.0)
Hemoglobin: 7.6 g/dL — ABNORMAL LOW (ref 12.0–15.0)
MCH: 28.5 pg (ref 26.0–34.0)
MCHC: 33.9 g/dL (ref 30.0–36.0)
MCV: 83.9 fL (ref 78.0–100.0)
Platelets: 178 10*3/uL (ref 150–400)
RBC: 2.67 MIL/uL — ABNORMAL LOW (ref 3.87–5.11)
RDW: 14.8 % (ref 11.5–15.5)
WBC: 14.7 10*3/uL — ABNORMAL HIGH (ref 4.0–10.5)

## 2017-05-06 LAB — MAGNESIUM: MAGNESIUM: 1.7 mg/dL (ref 1.7–2.4)

## 2017-05-06 MED ORDER — AMIODARONE HCL IN DEXTROSE 360-4.14 MG/200ML-% IV SOLN
60.0000 mg/h | INTRAVENOUS | Status: AC
  Administered 2017-05-06: 60 mg/h via INTRAVENOUS
  Filled 2017-05-06: qty 400

## 2017-05-06 MED ORDER — AMIODARONE HCL IN DEXTROSE 360-4.14 MG/200ML-% IV SOLN
30.0000 mg/h | INTRAVENOUS | Status: DC
  Administered 2017-05-07 (×2): 30 mg/h via INTRAVENOUS
  Filled 2017-05-06 (×2): qty 200

## 2017-05-06 MED ORDER — AMIODARONE LOAD VIA INFUSION
150.0000 mg | Freq: Once | INTRAVENOUS | Status: AC
  Administered 2017-05-06: 150 mg via INTRAVENOUS
  Filled 2017-05-06: qty 83.34

## 2017-05-06 MED ORDER — SODIUM CHLORIDE 0.9 % IV SOLN
8.0000 mg/kg | INTRAVENOUS | Status: DC
  Administered 2017-05-06: 720 mg via INTRAVENOUS
  Filled 2017-05-06: qty 14.4

## 2017-05-06 MED ORDER — FUROSEMIDE 10 MG/ML IJ SOLN
40.0000 mg | Freq: Once | INTRAMUSCULAR | Status: AC
  Administered 2017-05-06: 40 mg via INTRAVENOUS
  Filled 2017-05-06: qty 4

## 2017-05-06 NOTE — Progress Notes (Signed)
PROGRESS NOTE  Kimberly [name] ALP:[id number] DOB: [birth year] DOA: 05/06/2017 PCP: Abner Greenspan, MD   LOS: 3 days   Brief Narrative / Interim history: 83 year old lady with prior h/o chronic hyponatremia, hypertension, CAD,  Dementia, chronic systolic and diastolic heart failure recently discharged from [facility], comes in for acute respiratory failure with hypoxia and acute encephalopathy.  She was found to have MRSA bacteremia.  Hospital course complicated by new onset A. fib with RVR for which cardiology was consulted.  She is also having worsening renal failure.  Assessment & Plan: Principal Problem:   Sepsis ([place]) Active Problems:   Anxiety   Essential hypertension   LBBB (left bundle branch block)   Frequent UTI   Obesity   Hyponatremia   Chronic combined systolic and diastolic heart failure (HCC)   Hyperkalemia   Acute hypoxemic respiratory failure (HCC)   Septic shock (HCC)   MRSA bacteremia   Fracture of anatomical neck of humerus, right, closed, initial encounter   Stable burst fracture of T10 vertebra with delayed healing   New onset atrial fibrillation (HCC)   Acute respiratory failure with hypoxia possibly from mild pulmonary edema from acute on chronic systolic heart failure -Most recent 2D echo was done in July 2018 and showed an EF of 35% -pt came in hypotensive on arrival and did not receive any diuretics.  -Repeat 2D echo is pending  Severe sepsis due to MRSA bacteremia -Patient was hypotensive, febrile, tachypneic on admission meeting criteria. -On vancomycin since 10/1, infectious disease following, appreciate input, changed to daptomycin today due to worsening renal failure  Atrial fibrillation with RVR -patient's CHA2DS2-VASc Score for Stroke Risk is > 2 -This appears to be new as I do not see any evidence of A. fib in outpatient cardiology notes. -Doubt she is a candidate for anticoagulation with her deconditioning and recent fall -Consulted cardiology,  appreciate input, poorly controlled rates and patient was started on amiodarone today  Coronary artery disease -Followed by Dr. Rockey Situ in [place], also recently had a cardiac catheterization in August 2018 which showed RCA 30% stenosed, proximal 30% LAD stenosis -Troponin elevation likely due to demand ischemia in the setting of sepsis  Acute kidney injury on CKD III, hyperkalemia, and hyponatremia -Hyponatremia is somewhat chronic, likely worse due to fluid overload -Baseline creatinine 1.1-1.2, worsening now in the setting of sepsis, hypertension, A. fib with RVR, vancomycin use  Acute encephalopathy  -possibly from acute respiratory failure and sepsis  Hypertension -Now hypotensive as above   Hypothyroidism -Resume synthroid.   Goals of care -Discussed with daughter at bedside, the patient has had progressive decline in the setting of MRSA bacteremia, heart failure decompensation, new onset A. fib with RVR and renal failure.  She is becoming more encephalopathic, and her prognosis is very guarded at this point.  I recommended palliative care consultation to further discuss goals of care, consulted today   DVT prophylaxis: heparin Code Status: DNR Family Communication: no family at bedside Disposition Plan: TBD  Consultants:   ID  Cardiology   Procedures:   2D echo: pending  Antimicrobials:  Vancomycin 05/17/2017 >>  Subjective: -Lethargic, less responsive than yesterday.  BiPAP on.  Objective: Vitals:   05/06/17 1200 05/06/17 1215 05/06/17 1235 05/06/17 1300  BP: (!) 141/52  (!) 143/59 129/63  Pulse: (!) 113 (!) 104 (!) 105 (!) 121  Resp: (!) 29 (!) 25 (!) 30 (!) 29  Temp:      TempSrc:  SpO2: 98% 99% 98% 98%  Weight:      Height:        Intake/Output Summary (Last 24 hours) at 05/06/17 1323 Last data filed at 05/06/17 1141  Gross per 24 hour  Intake              200 ml  Output             1125 ml  Net             -925 ml   Filed Weights    05/04/17 1619 05/05/17 0438 05/06/17 0500  Weight: 84.2 kg (185 lb 11.2 oz) 84.8 kg (187 lb) 90 kg (198 lb 6.6 oz)    Examination:  Constitutional: Lethargic, BiPAP on Eyes: lids and conjunctivae normal Respiratory: Poor respiratory effort with the BiPAP on, overall decreased breath sounds Cardiovascular: Irregular, tachycardic, trace lower extremity edema Abdomen: no tenderness. Bowel sounds positive.  Skin: no rashes, lesions, ulcers. Neurologic: non focal, following commands intermittently but moving all 4 extremities    Data Reviewed: I have independently reviewed following labs and imaging studies   CBC:  Recent Labs Lab 05/01/17 0530 05/12/2017 1616 05/20/2017 1645 05/28/2017 2127 05/04/17 0802 05/05/17 0652 05/06/17 0527  WBC 7.6 15.7*  --  21.9* 20.6* 15.1* 14.7*  NEUTROABS 6.4 14.7*  --   --   --   --   --   HGB 9.5* 8.6* 9.2* 8.7* 9.6* 8.1* 7.6*  HCT 28.3* 26.7* 27.0* 26.9* 29.6* 25.5* 22.4*  MCV 87.6 85.6  --  86.2 86.3 85.3 83.9  PLT 173 187  --  193 175 203 573   Basic Metabolic Panel:  Recent Labs Lab 05/14/2017 1616 05/23/2017 1645 05/05/2017 2127 05/04/17 0802 05/04/17 2049 05/05/17 0652 05/06/17 0527  NA 119* 118* 119* 124*  --  124* 125*  K 6.0* 6.3* 5.6* 5.9* 5.5* 5.1 4.7  CL 83* 81* 88* 90*  --  89* 89*  CO2 25  --  23 19*  --  24 23  GLUCOSE 148* 145* 151* 133*  --  103* 105*  BUN 44* 57* 44* 44*  --  58* 72*  CREATININE 2.63* 2.70* 2.50* 2.21*  --  1.97* 2.49*  CALCIUM 7.8*  --  7.7* 7.9*  --  7.9* 7.6*  MG  --   --   --   --   --   --  1.7   GFR: Estimated Creatinine Clearance: 16.6 mL/min (A) (by C-G formula based on SCr of 2.49 mg/dL (H)). Liver Function Tests:  Recent Labs Lab 05/28/2017 1616 05/05/17 0652  AST 19 15  ALT 10* 13*  ALKPHOS 56 60  BILITOT 1.1 0.9  PROT 5.5* 5.3*  ALBUMIN 2.6* 2.1*   No results for input(s): LIPASE, AMYLASE in the last 168 hours. No results for input(s): AMMONIA in the last 168 hours. Coagulation  Profile:  Recent Labs Lab 05/09/2017 2127  INR 1.31   Cardiac Enzymes:  Recent Labs Lab 05/19/2017 2127 05/04/17 0314 05/04/17 0802  TROPONINI 0.54* 0.31* 0.33*   BNP (last 3 results)  Recent Labs  04/15/17 1307  PROBNP 3,962.0*   HbA1C: No results for input(s): HGBA1C in the last 72 hours. CBG: No results for input(s): GLUCAP in the last 168 hours. Lipid Profile: No results for input(s): CHOL, HDL, LDLCALC, TRIG, CHOLHDL, LDLDIRECT in the last 72 hours. Thyroid Function Tests: No results for input(s): TSH, T4TOTAL, FREET4, T3FREE, THYROIDAB in the last 72 hours. Anemia Panel: No results for  input(s): VITAMINB12, FOLATE, FERRITIN, TIBC, IRON, RETICCTPCT in the last 72 hours. Urine analysis:    Component Value Date/Time   COLORURINE YELLOW 05/04/2017 0110   APPEARANCEUR CLEAR 05/04/2017 0110   LABSPEC 1.012 05/04/2017 0110   PHURINE 6.0 05/04/2017 0110   GLUCOSEU NEGATIVE 05/04/2017 0110   HGBUR NEGATIVE 05/04/2017 0110   HGBUR trace-intact 06/25/2010 1028   BILIRUBINUR NEGATIVE 05/04/2017 0110   BILIRUBINUR Negative 12/07/2016 1535   KETONESUR NEGATIVE 05/04/2017 0110   PROTEINUR NEGATIVE 05/04/2017 0110   UROBILINOGEN 0.2 12/07/2016 1535   UROBILINOGEN 0.2 06/25/2010 1028   NITRITE NEGATIVE 05/04/2017 0110   LEUKOCYTESUR SMALL (A) 05/04/2017 0110   Sepsis Labs: Invalid input(s): PROCALCITONIN, LACTICIDVEN  Recent Results (from the past 240 hour(s))  Blood Culture (routine x 2)     Status: Abnormal   Collection Time: 05/13/2017  4:12 PM  Result Value Ref Range Status   Specimen Description BLOOD RIGHT ANTECUBITAL  Final   Special Requests   Final    BOTTLES DRAWN AEROBIC AND ANAEROBIC Blood Culture adequate volume   Culture  Setup Time   Final    GRAM POSITIVE COCCI IN CLUSTERS IN BOTH AEROBIC AND ANAEROBIC BOTTLES CRITICAL RESULT CALLED TO, READ BACK BY AND VERIFIED WITH: G.ABBOTT PHARMD 05/04/17 0643 L.CHAMPION    Culture METHICILLIN RESISTANT  STAPHYLOCOCCUS AUREUS (A)  Final   Report Status 05/06/2017 FINAL  Final   Organism ID, Bacteria METHICILLIN RESISTANT STAPHYLOCOCCUS AUREUS  Final      Susceptibility   Methicillin resistant staphylococcus aureus - MIC*    CIPROFLOXACIN >=8 RESISTANT Resistant     ERYTHROMYCIN >=8 RESISTANT Resistant     GENTAMICIN <=0.5 SENSITIVE Sensitive     OXACILLIN >=4 RESISTANT Resistant     TETRACYCLINE <=1 SENSITIVE Sensitive     VANCOMYCIN <=0.5 SENSITIVE Sensitive     TRIMETH/SULFA <=10 SENSITIVE Sensitive     CLINDAMYCIN <=0.25 SENSITIVE Sensitive     RIFAMPIN <=0.5 SENSITIVE Sensitive     Inducible Clindamycin NEGATIVE Sensitive     * METHICILLIN RESISTANT STAPHYLOCOCCUS AUREUS  Blood Culture ID Panel (Reflexed)     Status: Abnormal   Collection Time: 05/27/2017  4:12 PM  Result Value Ref Range Status   Enterococcus species NOT DETECTED NOT DETECTED Final   Listeria monocytogenes NOT DETECTED NOT DETECTED Final   Staphylococcus species DETECTED (A) NOT DETECTED Final    Comment: CRITICAL RESULT CALLED TO, READ BACK BY AND VERIFIED WITH: G.ABBOTT PHARMD 05/04/17 0643 L.CHAMPION    Staphylococcus aureus DETECTED (A) NOT DETECTED Final    Comment: Methicillin (oxacillin)-resistant Staphylococcus aureus (MRSA). MRSA is predictably resistant to beta-lactam antibiotics (except ceftaroline). Preferred therapy is vancomycin unless clinically contraindicated. Patient requires contact precautions if  hospitalized. CRITICAL RESULT CALLED TO, READ BACK BY AND VERIFIED WITH: G.ABBOTT PHARMD 05/04/17 0643 L.CHAMPION    Methicillin resistance DETECTED (A) NOT DETECTED Final    Comment: CRITICAL RESULT CALLED TO, READ BACK BY AND VERIFIED WITH: G.ABBOTT PHARMD 05/04/17 0643 L.CHAMPION    Streptococcus species NOT DETECTED NOT DETECTED Final   Streptococcus agalactiae NOT DETECTED NOT DETECTED Final   Streptococcus pneumoniae NOT DETECTED NOT DETECTED Final   Streptococcus pyogenes NOT DETECTED NOT  DETECTED Final   Acinetobacter baumannii NOT DETECTED NOT DETECTED Final   Enterobacteriaceae species NOT DETECTED NOT DETECTED Final   Enterobacter cloacae complex NOT DETECTED NOT DETECTED Final   Escherichia coli NOT DETECTED NOT DETECTED Final   Klebsiella oxytoca NOT DETECTED NOT DETECTED Final   Klebsiella pneumoniae NOT DETECTED  NOT DETECTED Final   Proteus species NOT DETECTED NOT DETECTED Final   Serratia marcescens NOT DETECTED NOT DETECTED Final   Haemophilus influenzae NOT DETECTED NOT DETECTED Final   Neisseria meningitidis NOT DETECTED NOT DETECTED Final   Pseudomonas aeruginosa NOT DETECTED NOT DETECTED Final   Candida albicans NOT DETECTED NOT DETECTED Final   Candida glabrata NOT DETECTED NOT DETECTED Final   Candida krusei NOT DETECTED NOT DETECTED Final   Candida parapsilosis NOT DETECTED NOT DETECTED Final   Candida tropicalis NOT DETECTED NOT DETECTED Final  Blood Culture (routine x 2)     Status: Abnormal   Collection Time: 05/06/2017  4:25 PM  Result Value Ref Range Status   Specimen Description BLOOD RIGHT HAND  Final   Special Requests   Final    BOTTLES DRAWN AEROBIC AND ANAEROBIC Blood Culture adequate volume   Culture  Setup Time   Final    GRAM POSITIVE COCCI IN CLUSTERS IN BOTH AEROBIC AND ANAEROBIC BOTTLES CRITICAL VALUE NOTED.  VALUE IS CONSISTENT WITH PREVIOUSLY REPORTED AND CALLED VALUE.    Culture (A)  Final    STAPHYLOCOCCUS AUREUS SUSCEPTIBILITIES PERFORMED ON PREVIOUS CULTURE WITHIN THE LAST 5 DAYS.    Report Status 05/06/2017 FINAL  Final  Urine culture     Status: Abnormal   Collection Time: 05/04/17  1:10 AM  Result Value Ref Range Status   Specimen Description URINE, CLEAN CATCH  Final   Special Requests NONE  Final   Culture <10,000 COLONIES/mL (A)  Final   Report Status 05/05/2017 FINAL  Final  MRSA PCR Screening     Status: Abnormal   Collection Time: 05/04/17  3:07 PM  Result Value Ref Range Status   MRSA by PCR POSITIVE (A)  NEGATIVE Final    Comment:        The GeneXpert MRSA Assay (FDA approved for NASAL specimens only), is one component of a comprehensive MRSA colonization surveillance program. It is not intended to diagnose MRSA infection nor to guide or monitor treatment for MRSA infections. RESULT CALLED TO, READ BACK BY AND VERIFIED WITH: Candiss Norse RN 17:40 05/04/17 ([email or web address])   Culture, blood (routine x 2)     Status: Abnormal   Collection Time: 05/05/17  6:52 AM  Result Value Ref Range Status   Specimen Description BLOOD RIGHT HAND  Final   Special Requests IN PEDIATRIC BOTTLE Blood Culture adequate volume  Final   Culture  Setup Time   Final    GRAM POSITIVE COCCI IN CLUSTERS IN PEDIATRIC BOTTLE CRITICAL VALUE NOTED.  VALUE IS CONSISTENT WITH PREVIOUSLY REPORTED AND CALLED VALUE.    Culture (A)  Final    STAPHYLOCOCCUS AUREUS SUSCEPTIBILITIES PERFORMED ON PREVIOUS CULTURE WITHIN THE LAST 5 DAYS.    Report Status 05/06/2017 FINAL  Final  Culture, blood (routine x 2)     Status: Abnormal   Collection Time: 05/05/17  6:58 AM  Result Value Ref Range Status   Specimen Description BLOOD RIGHT THUMB  Final   Special Requests IN PEDIATRIC BOTTLE Blood Culture adequate volume  Final   Culture  Setup Time   Final    GRAM POSITIVE COCCI IN CLUSTERS IN PEDIATRIC BOTTLE CRITICAL VALUE NOTED.  VALUE IS CONSISTENT WITH PREVIOUSLY REPORTED AND CALLED VALUE.    Culture (A)  Final    STAPHYLOCOCCUS AUREUS SUSCEPTIBILITIES PERFORMED ON PREVIOUS CULTURE WITHIN THE LAST 5 DAYS.    Report Status 05/06/2017 FINAL  Final      Radiology Studies: Dg Wrist  2 Views Left  Result Date: 05/05/2017 CLINICAL DATA:  Left wrist pain and swelling. EXAM: LEFT WRIST - 2 VIEW COMPARISON:  None. FINDINGS: There is no evidence of fracture or dislocation. There is no evidence of arthropathy or other focal bone abnormality. Soft tissues are unremarkable. IMPRESSION: Normal left wrist. Electronically Signed   By: Marijo Conception, M.D.   On: 05/05/2017 16:43     Scheduled Meds: . aspirin EC  81 mg Oral Daily  . calcitonin (salmon)  1 spray Alternating Nares Daily  . Chlorhexidine Gluconate Cloth  6 each Topical Q0600  . heparin  5,000 Units Subcutaneous Q8H  . levothyroxine  25 mcg Oral QAC breakfast  . mouth rinse  15 mL Mouth Rinse BID  . mupirocin ointment  1 application Nasal BID  . senna-docusate  1 tablet Oral BID   Continuous Infusions: . amiodarone 60 mg/hr (05/06/17 1247)   Followed by  . amiodarone    . DAPTOmycin (CUBICIN)  IV Stopped (05/06/17 1300)    Marzetta Board, MD, PhD [facility] Pager [phone]  If 7PM-7AM, please contact night-coverage [email or web address] Password [email or web address] 05/06/2017, 1:23 PM

## 2017-05-06 NOTE — Progress Notes (Signed)
  Amiodarone Drug - Drug Interaction Consult Note  Currently just getting one time dose of Lasix. K today is fine at 4.7.  Recommendations: Monitor potassium closely with diuretics Rx will continue to monitor for interactions  Amiodarone is metabolized by the cytochrome P450 system and therefore has the potential to cause many drug interactions. Amiodarone has an average plasma half-life of 50 days (range 20 to 100 days).   There is potential for drug interactions to occur several weeks or months after stopping treatment and the onset of drug interactions may be slow after initiating amiodarone.   []  Statins: Increased risk of myopathy. Simvastatin- restrict dose to 20mg  daily. Other statins: counsel patients to report any muscle pain or weakness immediately.  []  Anticoagulants: Amiodarone can increase anticoagulant effect. Consider warfarin dose reduction. Patients should be monitored closely and the dose of anticoagulant altered accordingly, remembering that amiodarone levels take several weeks to stabilize.  []  Antiepileptics: Amiodarone can increase plasma concentration of phenytoin, the dose should be reduced. Note that small changes in phenytoin dose can result in large changes in levels. Monitor patient and counsel on signs of toxicity.  []  Beta blockers: increased risk of bradycardia, AV block and myocardial depression. Sotalol - avoid concomitant use.  []   Calcium channel blockers (diltiazem and verapamil): increased risk of bradycardia, AV block and myocardial depression.  []   Cyclosporine: Amiodarone increases levels of cyclosporine. Reduced dose of cyclosporine is recommended.  []  Digoxin dose should be halved when amiodarone is started.  [x]  Diuretics: increased risk of cardiotoxicity if hypokalemia occurs.  []  Oral hypoglycemic agents (glyburide, glipizide, glimepiride): increased risk of hypoglycemia. Patient's glucose levels should be monitored closely when initiating  amiodarone therapy.   []  Drugs that prolong the QT interval:  Torsades de pointes risk may be increased with concurrent use - avoid if possible.  Monitor QTc, also keep magnesium/potassium WNL if concurrent therapy can't be avoided. Marland Kitchen Antibiotics: e.g. fluoroquinolones, erythromycin. . Antiarrhythmics: e.g. quinidine, procainamide, disopyramide, sotalol. . Antipsychotics: e.g. phenothiazines, haloperidol.  . Lithium, tricyclic antidepressants, and methadone.   Thank Mable Fill  05/06/2017 12:00 PM

## 2017-05-06 NOTE — Progress Notes (Signed)
  Echocardiogram 2D Echocardiogram has been performed.  Kimberly [name] 05/06/2017, 12:55 PM

## 2017-05-06 NOTE — Progress Notes (Signed)
SLP Cancellation Note  Patient Details Name: Kimberly [name] MRN: [id number] DOB: January 21, 1928   Cancelled treatment:       Reason Eval/Treat Not Completed: Medical issues which prohibited therapy (on BiPAP). Will f/u as able.   Germain Osgood 05/06/2017, 8:57 AM  Germain Osgood, M.A. CCC-SLP [phone]

## 2017-05-06 NOTE — Progress Notes (Signed)
Pharmacy Antibiotic Note  Kimberly [name] is a 83 y.o. female admitted on 05/26/2017 with bacteremia.  Pharmacy has been consulted for Vancomycin dosing.  Vanc D#4 for MRSA bacteremia - Tmax of 100.5 yesterday which is improving. WBC 14.7. Will need TEE and possible MRI of shoulder but probably plan for long treatment.  Plan: Hold vancomycin dose this afternoon Will check vanc random tonight and adjust regimen as needed  Monitor clinical picture, renal function, VR today F/U LOT   ADDENDUM:  ID switching to daptomycin today with fluctuating renal function  Plan: Stop vancomycin Start daptomycin 720mg  (~8mg /kg) IV Q48h Monitor clinical picture, renal function, CK weekly F/U C&S, abx deescalation / LOT   Height: 5\' 2"  (157.5 cm) Weight: 198 lb 6.6 oz (90 kg) IBW/kg (Calculated) : 50.1  Temp (24hrs), Avg:99.2 F (37.3 C), Min:98.4 F (36.9 C), Max:100.5 F (38.1 C)   Recent Labs Lab 05/15/2017 1616 05/20/2017 1645 05/05/2017 2127 05/04/17 0314 05/04/17 0802 05/05/17 0652 05/06/17 0527  WBC 15.7*  --  21.9*  --  20.6* 15.1* 14.7*  CREATININE 2.63* 2.70* 2.50*  --  2.21* 1.97* 2.49*  LATICACIDVEN  --  1.79 1.3 1.1  --   --   --     Estimated Creatinine Clearance: 16.6 mL/min (A) (by C-G formula based on SCr of 2.49 mg/dL (H)).    Allergies  Allergen Reactions  . Ciprofloxacin Other (See Comments)    REACTION: burning of mouth and skin  . Lisinopril Cough  . Remeron [Mirtazapine] Other (See Comments)    constipation  . Sulfonamide Derivatives Other (See Comments)    REACTION: malaise  . Tetanus Toxoid Swelling  . Vesicare [Solifenacin Succinate] Other (See Comments)    Blurred vision and weakness  . Zoloft [Sertraline Hcl] Nausea And Vomiting  . Cephalexin Rash and Other (See Comments)    REACTION: rash and burning sensation to skin on arms and legs with itching.  Madelin Headings [Amitriptyline] Palpitations  . Penicillins Rash and Other (See Comments)    Has patient had a  PCN reaction causing immediate rash, facial/tongue/throat swelling, SOB or lightheadedness with hypotension: Unknown Has patient had a PCN reaction causing severe rash involving mucus membranes or skin necrosis: Unknown Has patient had a PCN reaction that required hospitalization: Unknown Has patient had a PCN reaction occurring within the last 10 years: No If all of the above answers are "NO", then may proceed with Cephalosporin use. Has since take Augmentin with no problem   Thank you for allowing pharmacy to be a part of this patient's care.  Kimberly [name] 05/06/2017 10:28 AM

## 2017-05-06 NOTE — Progress Notes (Signed)
[facility] for Infectious Disease  Date of Admission:  05/30/2017     Total days of antibiotics 3  Vancomycin 05/22/2017 -              Aztreonam 05/30/2017 - 05/04/17         ASSESSMENT: MRSA Bacteremia  BCx 05/20/17 2/2 +MRSA  BCx 05/05/17 2/2 + MRSA  CHF Exacerbation/Respiratory Failure  BiPAP QHS and PRN - tolerating nasal cannula intermittently   Left humerus Fx   Very tender distally at wrist  Swollen and decreased radial pulse compared to right - no wrist Fx per XRay  AKI   Creatinine 2.7 > 1.97 > 2.4  PLAN:   Will change to Daptomycin with her worsening kidney function  Do not place PICC line yet please  Repeat blood cultures still positive MRSA - recheck again later today (vancomycin dose was increased last PM as she  was getting Q48h dosing)  Still awaiting TTE - on schedule for today  Will await TTE before ordering follow up imaging on left shoulder to evaluate. Doubtful she will be able to tolerate MRI  study with respiratory status. May need to consider non-contrast CT. Not certain TEE is the best idea given her overall  condition and tenuous pulmonary status  Agree with palliative care to discuss overall goals and expectations given severity of MRSA bacteremia in this elderly  woman with multiple other co-morbidities. Longer discussion with Maudie Mercury and her husband regarding purpose of  Palliative care team support given her mother's infection. Discussed MRSA is very dangerous and serious even  in young people that do not have other underlying conditions like her mother have so it makes it even more concerning  given her mother's underlying heart, kidney and respiratory conditions. Answered all questions.   Janene Madeira, MSN, NP-C [facility] for Infectious Disease [facility] Pager: [phone]  05/06/2017  10:15 AM     Principal Problem:   Sepsis ([place]) Active Problems:   Anxiety   Essential hypertension   LBBB (left bundle  branch block)   Frequent UTI   Obesity   Hyponatremia   Chronic combined systolic and diastolic heart failure (HCC)   Hyperkalemia   Acute hypoxemic respiratory failure (HCC)   Septic shock (HCC)   MRSA bacteremia   Fracture of anatomical neck of humerus, right, closed, initial encounter   Stable burst fracture of T10 vertebra with delayed healing   New onset atrial fibrillation ([place])   . aspirin EC  81 mg Oral Daily  . calcitonin (salmon)  1 spray Alternating Nares Daily  . Chlorhexidine Gluconate Cloth  6 each Topical Q0600  . heparin  5,000 Units Subcutaneous Q8H  . levothyroxine  25 mcg Oral QAC breakfast  . mouth rinse  15 mL Mouth Rinse BID  . metoprolol tartrate  25 mg Oral TID  . mupirocin ointment  1 application Nasal BID  . senna-docusate  1 tablet Oral BID    SUBJECTIVE: Interval History - WBC improved. No further fevers however temp was 96.7 deg this AM. Now with AFib with RVR with rate 110 - 120s. Echo not done yet. Repeat BCx drawn this AM and pending. Keeghan tells me she is feeling OK but having some diarrhea (got something to make her go yesterday).  Not very responsive today. Kim and her husband are at the bedside concerned over palliative care discussion - impression she is shutting down and won't make it.  Review of Systems: Review of Systems  Unable to perform ROS: Critical illness    Allergies  Allergen Reactions  . Ciprofloxacin Other (See Comments)    REACTION: burning of mouth and skin  . Lisinopril Cough  . Remeron [Mirtazapine] Other (See Comments)    constipation  . Sulfonamide Derivatives Other (See Comments)    REACTION: malaise  . Tetanus Toxoid Swelling  . Vesicare [Solifenacin Succinate] Other (See Comments)    Blurred vision and weakness  . Zoloft [Sertraline Hcl] Nausea And Vomiting  . Cephalexin Rash and Other (See Comments)    REACTION: rash and burning sensation to skin on arms and legs with itching.  Madelin Headings [Amitriptyline]  Palpitations  . Penicillins Rash and Other (See Comments)    Has patient had a PCN reaction causing immediate rash, facial/tongue/throat swelling, SOB or lightheadedness with hypotension: Unknown Has patient had a PCN reaction causing severe rash involving mucus membranes or skin necrosis: Unknown Has patient had a PCN reaction that required hospitalization: Unknown Has patient had a PCN reaction occurring within the last 10 years: No If all of the above answers are "NO", then may proceed with Cephalosporin use. Has since take Augmentin with no problem    OBJECTIVE: Vitals:   05/06/17 0500 05/06/17 0600 05/06/17 0729 05/06/17 0730  BP: (!) 115/58 (!) 116/58 101/64 101/64  Pulse: 99 (!) 103 (!) 140 (!) 128  Resp: (!) 24 (!) 25 (!) 22 (!) 28  Temp:   99.6 F (37.6 C)   TempSrc:   Axillary   SpO2: 98% 96% 98% 97%  Weight: 198 lb 6.6 oz (90 kg)     Height:       Body mass index is 36.29 kg/m.  Physical Exam  Constitutional:  Lying in bed on BiPAP. Minimally responsive. Grimaces and groans while assessing LUE.   Cardiovascular: S1 normal, S2 normal and normal heart sounds.  An irregularly irregular rhythm present. Tachycardia present.   No murmur heard. Pulses:      Radial pulses are 3+ on the right side, and 1+ on the left side.  Pulmonary/Chest: Effort normal and breath sounds normal. No respiratory distress. She has no rales.  Musculoskeletal: She exhibits edema.       Left wrist: She exhibits tenderness and swelling. She exhibits normal range of motion.     Neurological: She is alert.  Skin: Skin is warm and dry. No rash noted.    Lab Results Lab Results  Component Value Date   WBC 14.7 (H) 05/06/2017   HGB 7.6 (L) 05/06/2017   HCT 22.4 (L) 05/06/2017   MCV 83.9 05/06/2017   PLT 178 05/06/2017    Lab Results  Component Value Date   CREATININE 2.49 (H) 05/06/2017   BUN 72 (H) 05/06/2017   NA 125 (L) 05/06/2017   K 4.7 05/06/2017   CL 89 (L) 05/06/2017   CO2 23  05/06/2017    Lab Results  Component Value Date   ALT 13 (L) 05/05/2017   AST 15 05/05/2017   ALKPHOS 60 05/05/2017   BILITOT 0.9 05/05/2017     Microbiology: Recent Results (from the past 240 hour(s))  Blood Culture (routine x 2)     Status: Abnormal   Collection Time: 05/25/2017  4:12 PM  Result Value Ref Range Status   Specimen Description BLOOD RIGHT ANTECUBITAL  Final   Special Requests   Final    BOTTLES DRAWN AEROBIC AND ANAEROBIC Blood Culture adequate volume   Culture  Setup  Time   Final    GRAM POSITIVE COCCI IN CLUSTERS IN BOTH AEROBIC AND ANAEROBIC BOTTLES CRITICAL RESULT CALLED TO, READ BACK BY AND VERIFIED WITH: G.ABBOTT PHARMD 05/04/17 0643 L.CHAMPION    Culture METHICILLIN RESISTANT STAPHYLOCOCCUS AUREUS (A)  Final   Report Status 05/06/2017 FINAL  Final   Organism ID, Bacteria METHICILLIN RESISTANT STAPHYLOCOCCUS AUREUS  Final      Susceptibility   Methicillin resistant staphylococcus aureus - MIC*    CIPROFLOXACIN >=8 RESISTANT Resistant     ERYTHROMYCIN >=8 RESISTANT Resistant     GENTAMICIN <=0.5 SENSITIVE Sensitive     OXACILLIN >=4 RESISTANT Resistant     TETRACYCLINE <=1 SENSITIVE Sensitive     VANCOMYCIN <=0.5 SENSITIVE Sensitive     TRIMETH/SULFA <=10 SENSITIVE Sensitive     CLINDAMYCIN <=0.25 SENSITIVE Sensitive     RIFAMPIN <=0.5 SENSITIVE Sensitive     Inducible Clindamycin NEGATIVE Sensitive     * METHICILLIN RESISTANT STAPHYLOCOCCUS AUREUS  Blood Culture ID Panel (Reflexed)     Status: Abnormal   Collection Time: 06/01/2017  4:12 PM  Result Value Ref Range Status   Enterococcus species NOT DETECTED NOT DETECTED Final   Listeria monocytogenes NOT DETECTED NOT DETECTED Final   Staphylococcus species DETECTED (A) NOT DETECTED Final    Comment: CRITICAL RESULT CALLED TO, READ BACK BY AND VERIFIED WITH: G.ABBOTT PHARMD 05/04/17 0643 L.CHAMPION    Staphylococcus aureus DETECTED (A) NOT DETECTED Final    Comment: Methicillin (oxacillin)-resistant  Staphylococcus aureus (MRSA). MRSA is predictably resistant to beta-lactam antibiotics (except ceftaroline). Preferred therapy is vancomycin unless clinically contraindicated. Patient requires contact precautions if  hospitalized. CRITICAL RESULT CALLED TO, READ BACK BY AND VERIFIED WITH: G.ABBOTT PHARMD 05/04/17 0643 L.CHAMPION    Methicillin resistance DETECTED (A) NOT DETECTED Final    Comment: CRITICAL RESULT CALLED TO, READ BACK BY AND VERIFIED WITH: G.ABBOTT PHARMD 05/04/17 0643 L.CHAMPION    Streptococcus species NOT DETECTED NOT DETECTED Final   Streptococcus agalactiae NOT DETECTED NOT DETECTED Final   Streptococcus pneumoniae NOT DETECTED NOT DETECTED Final   Streptococcus pyogenes NOT DETECTED NOT DETECTED Final   Acinetobacter baumannii NOT DETECTED NOT DETECTED Final   Enterobacteriaceae species NOT DETECTED NOT DETECTED Final   Enterobacter cloacae complex NOT DETECTED NOT DETECTED Final   Escherichia coli NOT DETECTED NOT DETECTED Final   Klebsiella oxytoca NOT DETECTED NOT DETECTED Final   Klebsiella pneumoniae NOT DETECTED NOT DETECTED Final   Proteus species NOT DETECTED NOT DETECTED Final   Serratia marcescens NOT DETECTED NOT DETECTED Final   Haemophilus influenzae NOT DETECTED NOT DETECTED Final   Neisseria meningitidis NOT DETECTED NOT DETECTED Final   Pseudomonas aeruginosa NOT DETECTED NOT DETECTED Final   Candida albicans NOT DETECTED NOT DETECTED Final   Candida glabrata NOT DETECTED NOT DETECTED Final   Candida krusei NOT DETECTED NOT DETECTED Final   Candida parapsilosis NOT DETECTED NOT DETECTED Final   Candida tropicalis NOT DETECTED NOT DETECTED Final  Blood Culture (routine x 2)     Status: Abnormal   Collection Time: 05/29/2017  4:25 PM  Result Value Ref Range Status   Specimen Description BLOOD RIGHT HAND  Final   Special Requests   Final    BOTTLES DRAWN AEROBIC AND ANAEROBIC Blood Culture adequate volume   Culture  Setup Time   Final    GRAM  POSITIVE COCCI IN CLUSTERS IN BOTH AEROBIC AND ANAEROBIC BOTTLES CRITICAL VALUE NOTED.  VALUE IS CONSISTENT WITH PREVIOUSLY REPORTED AND CALLED VALUE.  Culture (A)  Final    STAPHYLOCOCCUS AUREUS SUSCEPTIBILITIES PERFORMED ON PREVIOUS CULTURE WITHIN THE LAST 5 DAYS.    Report Status 05/06/2017 FINAL  Final  Urine culture     Status: Abnormal   Collection Time: 05/04/17  1:10 AM  Result Value Ref Range Status   Specimen Description URINE, CLEAN CATCH  Final   Special Requests NONE  Final   Culture <10,000 COLONIES/mL (A)  Final   Report Status 05/05/2017 FINAL  Final  MRSA PCR Screening     Status: Abnormal   Collection Time: 05/04/17  3:07 PM  Result Value Ref Range Status   MRSA by PCR POSITIVE (A) NEGATIVE Final    Comment:        The GeneXpert MRSA Assay (FDA approved for NASAL specimens only), is one component of a comprehensive MRSA colonization surveillance program. It is not intended to diagnose MRSA infection nor to guide or monitor treatment for MRSA infections. RESULT CALLED TO, READ BACK BY AND VERIFIED WITH: Candiss Norse RN 17:40 05/04/17 ([email or web address])   Culture, blood (routine x 2)     Status: None (Preliminary result)   Collection Time: 05/05/17  6:52 AM  Result Value Ref Range Status   Specimen Description BLOOD RIGHT HAND  Final   Special Requests IN PEDIATRIC BOTTLE Blood Culture adequate volume  Final   Culture  Setup Time   Final    GRAM POSITIVE COCCI IN CLUSTERS IN PEDIATRIC BOTTLE CRITICAL VALUE NOTED.  VALUE IS CONSISTENT WITH PREVIOUSLY REPORTED AND CALLED VALUE.    Culture GRAM POSITIVE COCCI  Final   Report Status PENDING  Incomplete  Culture, blood (routine x 2)     Status: None (Preliminary result)   Collection Time: 05/05/17  6:58 AM  Result Value Ref Range Status   Specimen Description BLOOD RIGHT THUMB  Final   Special Requests IN PEDIATRIC BOTTLE Blood Culture adequate volume  Final   Culture  Setup Time   Final    GRAM POSITIVE COCCI IN  CLUSTERS IN PEDIATRIC BOTTLE CRITICAL VALUE NOTED.  VALUE IS CONSISTENT WITH PREVIOUSLY REPORTED AND CALLED VALUE.    Culture GRAM POSITIVE COCCI  Final   Report Status PENDING  Incomplete

## 2017-05-06 NOTE — Plan of Care (Signed)
Problem: Education: Goal: Knowledge of [place] [facility] Education information/materials will improve Outcome: Progressing Discussed plan of care for the evening and keeping bipap ion with some teach back displayed

## 2017-05-06 NOTE — Progress Notes (Signed)
Progress Note  Patient Name: Kimberly [name] Date of Encounter: 05/06/2017  Primary Cardiologist: Rockey Situ  Subjective   She is worse today. Breathing has worsened and she is requiring BiPAP. She is less alert. Renal function has deteriorated, with plan to switch to daptomycin. WBC is lower and she is afebrile. Persistently positive blood cultures for MRSA strongly increase the level of suspicion for endocarditis. Still waiting for the transthoracic echocardiogram. Ventricular rate control remains poor, even deteriorated somewhat from yesterday.  Inpatient Medications    Scheduled Meds: . aspirin EC  81 mg Oral Daily  . calcitonin (salmon)  1 spray Alternating Nares Daily  . Chlorhexidine Gluconate Cloth  6 each Topical Q0600  . heparin  5,000 Units Subcutaneous Q8H  . levothyroxine  25 mcg Oral QAC breakfast  . mouth rinse  15 mL Mouth Rinse BID  . metoprolol tartrate  25 mg Oral TID  . mupirocin ointment  1 application Nasal BID  . senna-docusate  1 tablet Oral BID   Continuous Infusions: . DAPTOmycin (CUBICIN)  IV     PRN Meds: ondansetron **OR** ondansetron (ZOFRAN) IV, oxyCODONE, polyethylene glycol, traMADol   Vital Signs    Vitals:   05/06/17 0500 05/06/17 0600 05/06/17 0729 05/06/17 0730  BP: (!) 115/58 (!) 116/58 101/64 101/64  Pulse: 99 (!) 103 (!) 140 (!) 128  Resp: (!) 24 (!) 25 (!) 22 (!) 28  Temp:   99.6 F (37.6 C)   TempSrc:   Axillary   SpO2: 98% 96% 98% 97%  Weight: 198 lb 6.6 oz (90 kg)     Height:        Intake/Output Summary (Last 24 hours) at 05/06/17 1048 Last data filed at 05/06/17 0500  Gross per 24 hour  Intake              200 ml  Output             1050 ml  Net             -850 ml   Filed Weights   05/04/17 1619 05/05/17 0438 05/06/17 0500  Weight: 185 lb 11.2 oz (84.2 kg) 187 lb (84.8 kg) 198 lb 6.6 oz (90 kg)    Telemetry    Atrial fibrillation with rapid ventricular response - Personally Reviewed  ECG    Atrial fibrillation  with rapid ventricular response, left bundle branch block, no changes - Personally Reviewed  Physical Exam  Responds to stimulation, but is stuporous GEN: No acute distress.   Neck: 10 cm JVP Cardiac: irregular, split S2, no murmurs, rubs, or gallops.  Respiratory: Clear to auscultation bilaterally. GI: Soft, nontender, non-distended  MS: Swollen left upper extremity; No deformity. Neuro:  Nonfocal  Psych: Normal affect   Labs    Chemistry Recent Labs Lab 05/13/2017 1616  05/04/17 0802 05/04/17 2049 05/05/17 0652 05/06/17 0527  NA 119*  < > 124*  --  124* 125*  K 6.0*  < > 5.9* 5.5* 5.1 4.7  CL 83*  < > 90*  --  89* 89*  CO2 25  < > 19*  --  24 23  GLUCOSE 148*  < > 133*  --  103* 105*  BUN 44*  < > 44*  --  58* 72*  CREATININE 2.63*  < > 2.21*  --  1.97* 2.49*  CALCIUM 7.8*  < > 7.9*  --  7.9* 7.6*  PROT 5.5*  --   --   --  5.3*  --  ALBUMIN 2.6*  --   --   --  2.1*  --   AST 19  --   --   --  15  --   ALT 10*  --   --   --  13*  --   ALKPHOS 56  --   --   --  60  --   BILITOT 1.1  --   --   --  0.9  --   GFRNONAA 15*  < > 19*  --  22* 16*  GFRAA 18*  < > 22*  --  25* 19*  ANIONGAP 11  < > 15  --  11 13  < > = values in this interval not displayed.   Hematology Recent Labs Lab 05/04/17 0802 05/05/17 0652 05/06/17 0527  WBC 20.6* 15.1* 14.7*  RBC 3.43* 2.99* 2.67*  HGB 9.6* 8.1* 7.6*  HCT 29.6* 25.5* 22.4*  MCV 86.3 85.3 83.9  MCH 28.0 27.1 28.5  MCHC 32.4 31.8 33.9  RDW 14.1 14.3 14.8  PLT 175 203 178    Cardiac Enzymes Recent Labs Lab 05/26/2017 2127 05/04/17 0314 05/04/17 0802  TROPONINI 0.54* 0.31* 0.33*   No results for input(s): TROPIPOC in the last 168 hours.   BNPNo results for input(s): BNP, PROBNP in the last 168 hours.   DDimer No results for input(s): DDIMER in the last 168 hours.   Radiology    Dg Wrist 2 Views Left  Result Date: 05/05/2017 CLINICAL DATA:  Left wrist pain and swelling. EXAM: LEFT WRIST - 2 VIEW COMPARISON:  None.  FINDINGS: There is no evidence of fracture or dislocation. There is no evidence of arthropathy or other focal bone abnormality. Soft tissues are unremarkable. IMPRESSION: Normal left wrist. Electronically Signed   By: Marijo Conception, M.D.   On: 05/05/2017 16:43    Cardiac Studies   Echocardiogram: 01/2017 Study Conclusions  - Left ventricle: The cavity size was severely dilated. There was mild concentric hypertrophy. Systolic function was moderately to severely reduced. The estimated ejection fraction was 35%. Diffuse hypokinesis. - Aortic valve: There was mild regurgitation. - Mitral valve: There was mild regurgitation. - Pulmonary arteries: PA peak pressure: 36 mm Hg (S).  Impressions:  - The right ventricular systolic pressure was increased consistent with mild pulmonary hypertension. Severely dilated left atrium and left ventricle with severe left ventricular systolic dysfunction] LVEF 33-35%, with diffuse hypokinesis. Severe mitral annular calcification with mild to moderate mitral regurgitation and mild pulmonary hypertension. Fibrocalcific 5 aortic valve without any significant aortic stenosis but mild aortic regurgitation. There is grade 2 diastolic dysfunction.   Cardiac Catheterization: 03/03/2017  Mid RCA to Dist RCA lesion, 30 %stenosed.  The left ventricular ejection fraction is 45-50% by visual estimate.  There is no mitral valve regurgitation.  There is mild left ventricular systolic dysfunction.  LV end diastolic pressure is normal.  Prox LAD lesion, 30 %stenosed.  Patient Profile     83 y.o. female with chronic systolic heart failure and new onset atrial fibrillation with rapid ventricular response in the setting of MRSA sepsis, acute renal insufficiency, severe hyponatremia  Assessment & Plan    1. New-onset Atrial Fibrillation - CHA2DS2-VASc Score and unadjusted Ischemic Stroke Rate (% per year) is equal to 9.7 % stroke  rate/year from a score of 6 (CHF, HTN, Coronary Calcifications, Female, Age (2), at the very poor candidate for long-term anticoagulation due to overall frailty, frequent falls, volatile renal function, acute anemia. - switch to amiodarone  for rate control since her blood pressure is borderline.Unable to swallow the oral metoprolol. 2. CAD/ Elevated Troponin  - she has not had angina and she only had nonobstructive CAD by cath in 03/2017.  - suspect demand ischemia, not a true acute coronary syndrome. 3. Acute on chronic combined systolic and diastolic CHF - EF 16% by echo in 01/2017.  - acute exacerbation related to atrial fibrillation and volume overload, secondary to intravenous fluids administrated for sepsis. I'm not sure that today's weight recording is correct, but if so she is roughly 28 pounds above her "dry weight". - she is clearly hypervolemic. Despite renal insufficiency will give one dose of diuretic and follow response. - RAAS inhibitors on hold due to acute renal insufficiency. 4. Acute on Chronic Stage 3 CKD - baseline creatinine 1.0-1.1. - creatinine elevated to 2.63 on admission, improved to 1.97 yesterday, but worse today at 2.49. Switching from vancomycin to daptomycin - borderline oliguric 5. MRSA bacteremia/sepsis - ID is following. Blood cultures from October 2 and October 3 persistently positive, raising concern for bloodstream infection/endocarditis. - he is very frail and her respiratory status is tenuous. I agree she is a marginal candidate for TEE. If the transthoracic echo clearly confirms the presence of vegetations and there is no reason to evaluate for other severe valvular pathology, would like to avoid any procedure that could put her respiratory status and further peril. 6. Hyponatremia - Na+ 119 on admission, improved to 125.  Overall prognosis appears poor. Family reaffirms the patient has repeatedly expressed a desire for DNR status.  For questions or  updates, please contact [place] Please consult [email or web address] for contact info under Cardiology/STEMI.      Signed, Sanda Klein, MD  05/06/2017, 10:48 AM

## 2017-05-06 NOTE — Progress Notes (Signed)
Pharmacy Antibiotic Note  Kimberly [name] is a 83 y.o. female admitted on 05/09/2017 with bacteremia.  Pharmacy has been consulted for Vancomycin dosing.  Vanc D#4 for MRSA bacteremia - Tmax of 100.5 yesterday which is improving. WBC 14.7. Will need TEE and possible MRI of shoulder but probably plan for long treatment.  Plan: Hold vancomycin dose this afternoon Will check vanc random tonight and adjust regimen as needed  Monitor clinical picture, renal function, VR today F/U LOT  Height: 5\' 2"  (157.5 cm) Weight: 198 lb 6.6 oz (90 kg) IBW/kg (Calculated) : 50.1  Temp (24hrs), Avg:99.2 F (37.3 C), Min:98.4 F (36.9 C), Max:100.5 F (38.1 C)   Recent Labs Lab 05/31/2017 1616 05/10/2017 1645 05/30/2017 2127 05/04/17 0314 05/04/17 0802 05/05/17 0652 05/06/17 0527  WBC 15.7*  --  21.9*  --  20.6* 15.1* 14.7*  CREATININE 2.63* 2.70* 2.50*  --  2.21* 1.97* 2.49*  LATICACIDVEN  --  1.79 1.3 1.1  --   --   --     Estimated Creatinine Clearance: 16.6 mL/min (A) (by C-G formula based on SCr of 2.49 mg/dL (H)).    Allergies  Allergen Reactions  . Ciprofloxacin Other (See Comments)    REACTION: burning of mouth and skin  . Lisinopril Cough  . Remeron [Mirtazapine] Other (See Comments)    constipation  . Sulfonamide Derivatives Other (See Comments)    REACTION: malaise  . Tetanus Toxoid Swelling  . Vesicare [Solifenacin Succinate] Other (See Comments)    Blurred vision and weakness  . Zoloft [Sertraline Hcl] Nausea And Vomiting  . Cephalexin Rash and Other (See Comments)    REACTION: rash and burning sensation to skin on arms and legs with itching.  Madelin Headings [Amitriptyline] Palpitations  . Penicillins Rash and Other (See Comments)    Has patient had a PCN reaction causing immediate rash, facial/tongue/throat swelling, SOB or lightheadedness with hypotension: Unknown Has patient had a PCN reaction causing severe rash involving mucus membranes or skin necrosis: Unknown Has patient had a  PCN reaction that required hospitalization: Unknown Has patient had a PCN reaction occurring within the last 10 years: No If all of the above answers are "NO", then may proceed with Cephalosporin use. Has since take Augmentin with no problem   Thank you for allowing pharmacy to be a part of this patient's care.  Kimberly [name] 05/06/2017 9:16 AM

## 2017-05-07 DIAGNOSIS — F419 Anxiety disorder, unspecified: Secondary | ICD-10-CM

## 2017-05-07 DIAGNOSIS — S42291A Other displaced fracture of upper end of right humerus, initial encounter for closed fracture: Secondary | ICD-10-CM

## 2017-05-07 DIAGNOSIS — Z515 Encounter for palliative care: Secondary | ICD-10-CM

## 2017-05-07 DIAGNOSIS — I5023 Acute on chronic systolic (congestive) heart failure: Secondary | ICD-10-CM

## 2017-05-07 DIAGNOSIS — I5042 Chronic combined systolic (congestive) and diastolic (congestive) heart failure: Secondary | ICD-10-CM

## 2017-05-07 DIAGNOSIS — Z7189 Other specified counseling: Secondary | ICD-10-CM

## 2017-05-07 LAB — CBC
HCT: 24.4 % — ABNORMAL LOW (ref 36.0–46.0)
Hemoglobin: 8 g/dL — ABNORMAL LOW (ref 12.0–15.0)
MCH: 27.1 pg (ref 26.0–34.0)
MCHC: 32.8 g/dL (ref 30.0–36.0)
MCV: 82.7 fL (ref 78.0–100.0)
PLATELETS: 188 10*3/uL (ref 150–400)
RBC: 2.95 MIL/uL — AB (ref 3.87–5.11)
RDW: 14.4 % (ref 11.5–15.5)
WBC: 17.6 10*3/uL — AB (ref 4.0–10.5)

## 2017-05-07 LAB — BASIC METABOLIC PANEL
Anion gap: 14 (ref 5–15)
BUN: 89 mg/dL — AB (ref 6–20)
CALCIUM: 7.9 mg/dL — AB (ref 8.9–10.3)
CHLORIDE: 90 mmol/L — AB (ref 101–111)
CO2: 23 mmol/L (ref 22–32)
CREATININE: 2.37 mg/dL — AB (ref 0.44–1.00)
GFR calc non Af Amer: 17 mL/min — ABNORMAL LOW (ref >60)
GFR, EST AFRICAN AMERICAN: 20 mL/min — AB (ref >60)
Glucose, Bld: 120 mg/dL — ABNORMAL HIGH (ref 65–99)
Potassium: 4.4 mmol/L (ref 3.5–5.1)
SODIUM: 127 mmol/L — AB (ref 135–145)

## 2017-05-07 LAB — CK: Total CK: 41 U/L (ref 38–234)

## 2017-05-07 MED ORDER — FUROSEMIDE 10 MG/ML IJ SOLN
80.0000 mg | Freq: Once | INTRAMUSCULAR | Status: AC
  Administered 2017-05-07: 80 mg via INTRAVENOUS
  Filled 2017-05-07: qty 8

## 2017-05-07 MED ORDER — SODIUM CHLORIDE 0.9 % IV SOLN
1.0000 mg/h | INTRAVENOUS | Status: DC
  Administered 2017-05-07: 1 mg/h via INTRAVENOUS
  Filled 2017-05-07: qty 10

## 2017-05-07 MED ORDER — MORPHINE BOLUS VIA INFUSION
1.0000 mg | INTRAVENOUS | Status: DC | PRN
  Administered 2017-05-07: 1 mg via INTRAVENOUS
  Filled 2017-05-07: qty 2

## 2017-05-07 MED ORDER — LORAZEPAM 2 MG/ML IJ SOLN
1.0000 mg | Freq: Once | INTRAMUSCULAR | Status: AC
  Administered 2017-05-07: 1 mg via INTRAVENOUS
  Filled 2017-05-07: qty 1

## 2017-05-07 MED ORDER — SODIUM CHLORIDE 0.9 % IV SOLN
INTRAVENOUS | Status: DC
  Administered 2017-05-07: 500 mL via INTRAVENOUS

## 2017-05-07 MED ORDER — ACETAMINOPHEN 650 MG RE SUPP
325.0000 mg | RECTAL | Status: DC | PRN
  Administered 2017-05-07: 325 mg via RECTAL
  Filled 2017-05-07: qty 1

## 2017-05-07 MED ORDER — GLYCOPYRROLATE 0.2 MG/ML IJ SOLN: 0.2000 mg | Freq: Four times a day (QID) | INTRAMUSCULAR | Status: DC | PRN

## 2017-05-07 MED ORDER — MORPHINE SULFATE (PF) 4 MG/ML IV SOLN
1.0000 mg | INTRAVENOUS | Status: AC
  Administered 2017-05-07: 1 mg via INTRAVENOUS
  Filled 2017-05-07: qty 1

## 2017-05-07 MED ORDER — LORAZEPAM 2 MG/ML IJ SOLN: 0.5000 mg | Freq: Four times a day (QID) | INTRAMUSCULAR | Status: DC | PRN

## 2017-05-08 LAB — CULTURE, BLOOD (ROUTINE X 2): SPECIAL REQUESTS: ADEQUATE

## 2017-05-08 NOTE — Progress Notes (Signed)
Wasted morphine drip in the sink with Angela Nevin T. RN approximately 255ml.

## 2017-05-08 NOTE — Progress Notes (Signed)
Tried calling family, left message to voicemail.

## 2017-05-08 NOTE — Progress Notes (Signed)
Tried calling family. No call back.

## 2017-05-08 NOTE — Progress Notes (Signed)
Called family again. Left message to voicemail.

## 2017-05-08 NOTE — Progress Notes (Signed)
Attempted to call family at this time. Left message to voicemail.

## 2017-05-08 NOTE — Progress Notes (Signed)
Called pt placement office, advised to wait a little longer till family comes to see the patient.

## 2017-05-08 NOTE — Discharge Summary (Addendum)
Death Summary  Kimberly [name] MPN:[id number] DOB: [birth year] DOA: May 10, 2017  PCP: Abner Greenspan, MD  Admit date: May 10, 2017 Death date: 05-15-2017   HPI: Per Dr. Shanon Brow, Kimberly [name] is a 83 y.o. female with medical history significant of chf, chronic hyponatremia, htn, cad just d/c from [place] 2 days ago to rehab brought in by ems from rehab center for oxygen sats 65% on RA earlier.  Pt chronically confused without formal dx of dementia but worse today per dtr.  All history obtained from daughter.  No n/vd.  Not been eating or drinking well.  No cough.  No mention of pain.  Temp was over 102 on arrival.  Had to be put on bipap due to altered mental status.  Improving overall in ED and almost returned to baseline.  Pt septic and referred for admission for such.    Hospital Course: Discharge Diagnoses:  Principal Problem:   Sepsis ([place]) Active Problems:   Anxiety   Essential hypertension   LBBB (left bundle branch block)   Frequent UTI   Obesity   Hyponatremia   Chronic combined systolic and diastolic heart failure (HCC)   Hyperkalemia   Acute hypoxemic respiratory failure (HCC)   Septic shock (HCC)   MRSA bacteremia   Fracture of anatomical neck of humerus, right, closed, initial encounter   Stable burst fracture of T10 vertebra with delayed healing   New onset atrial fibrillation ([place])   Palliative care by specialist  Severe sepsis/septic shock due to MRSA bacteremia -Patient was hypotensive, febrile, tachypneic on admission meeting criteria.  Patient was started on vancomycin, and infectious disease was consulted.  Patient developed multiorgan failure including renal failure, and vancomycin was changed to daptomycin.  Palliative care was consulted as well given progressive decline despite aggressive treatment, and decision was made on 10/5 for patient to be transitioned to comfort care.  Patient passed away shortly thereafter. Acute respiratory failure with hypoxia possibly from mild  pulmonary edema from acute on chronic systolic heart failure -Repeat 2D echo shows an EF of 30-35% Atrial fibrillation with RVR -new onset, triggered by sepsis Coronary artery disease -Troponin elevation likely due to demand ischemia in the setting of sepsis Acute kidney injury on CKD III, hyperkalemia, and hyponatremia -progressive renal failure Acute encephalopathy -from sepsis Hypothyroidism   Discharge Instructions  Consultations:  Cardiology   Palliative care  Procedures/Studies:  2D echo  Study Conclusions - Left ventricle: The cavity size was normal. Wall thickness was normal. Systolic function was moderately to severely reduced. The estimated ejection fraction was in the range of 30% to 35%. - Ventricular septum: Septal motion showed paradox. - Aortic valve: Moderately calcified annulus. - Mitral valve: Mildly calcified annulus. - Left atrium: The atrium was moderately dilated.   Dg Wrist 2 Views Left  Result Date: 05/05/2017 CLINICAL DATA:  Left wrist pain and swelling. EXAM: LEFT WRIST - 2 VIEW COMPARISON:  None. FINDINGS: There is no evidence of fracture or dislocation. There is no evidence of arthropathy or other focal bone abnormality. Soft tissues are unremarkable. IMPRESSION: Normal left wrist. Electronically Signed   By: Marijo Conception, M.D.   On: 05/05/2017 16:43   Ct Head Wo Contrast  Result Date: 04/27/2017 CLINICAL DATA:  Fall this morning, found down. Bruising along the back of the head. EXAM: CT HEAD WITHOUT CONTRAST CT CERVICAL SPINE WITHOUT CONTRAST TECHNIQUE: Multidetector CT imaging of the head and cervical spine was performed following the standard protocol without intravenous contrast. Multiplanar CT  image reconstructions of the cervical spine were also generated. COMPARISON:  04/28/2016 FINDINGS: CT HEAD FINDINGS Brain: Stable calcifications in the lentiform nuclei bilaterally, likely physiologic. Otherwise, the brainstem, cerebellum, cerebral peduncles,  thalami, basal ganglia, basilar cisterns, and ventricular system appear within normal limits. Periventricular white matter and corona radiata hypodensities favor chronic ischemic microvascular white matter disease. No intracranial hemorrhage, mass lesion, or acute CVA. Vascular: There is atherosclerotic calcification of the cavernous carotid arteries bilaterally. Skull: Unremarkable Sinuses/Orbits: Complete opacification of the left sphenoid sinus. Chronic ethmoid sinusitis. Other: No supplemental non-categorized findings. CT CERVICAL SPINE FINDINGS Alignment: Mild reversal the normal cervical lordosis. 1.5 mm degenerative anterolisthesis at C3-4. Skull base and vertebrae: Spurring and loss of articular space at the anterior C1- 2 articulation. Degenerative loss of intervertebral disc height at C3-4 and C4-5. No acute cervical spine fracture. Soft tissues and spinal canal: Confluent nodularity in the somewhat and large left thyroid gland. Right thyroid lobe is absent. Rightward deviation of the trachea. Disc levels: Uncinate and facet spurring cause at least moderate right foraminal stenosis at C4-5, and borderline left foraminal stenosis at C3-4 and C4-5. Upper chest: Unremarkable Other: No supplemental non-categorized findings. IMPRESSION: 1. No acute intracranial findings are acute cervical spine findings. 2. Paranasal sinusitis with complete opacification of the left sphenoid sinus and chronic because all thickening in the ethmoid air cells. 3. Cervical spondylosis, causing right foraminal impingement at C4-5. 4. Prior right thyroid lobe resection. Nodular and enlarged left thyroid lobe. This has been previously worked up for example on prior thyroid ultrasound 08/04/2012. Electronically Signed   By: Van Clines M.D.   On: 04/27/2017 17:12   Ct Cervical Spine Wo Contrast  Result Date: 04/27/2017 CLINICAL DATA:  Fall this morning, found down. Bruising along the back of the head. EXAM: CT HEAD WITHOUT  CONTRAST CT CERVICAL SPINE WITHOUT CONTRAST TECHNIQUE: Multidetector CT imaging of the head and cervical spine was performed following the standard protocol without intravenous contrast. Multiplanar CT image reconstructions of the cervical spine were also generated. COMPARISON:  04/28/2016 FINDINGS: CT HEAD FINDINGS Brain: Stable calcifications in the lentiform nuclei bilaterally, likely physiologic. Otherwise, the brainstem, cerebellum, cerebral peduncles, thalami, basal ganglia, basilar cisterns, and ventricular system appear within normal limits. Periventricular white matter and corona radiata hypodensities favor chronic ischemic microvascular white matter disease. No intracranial hemorrhage, mass lesion, or acute CVA. Vascular: There is atherosclerotic calcification of the cavernous carotid arteries bilaterally. Skull: Unremarkable Sinuses/Orbits: Complete opacification of the left sphenoid sinus. Chronic ethmoid sinusitis. Other: No supplemental non-categorized findings. CT CERVICAL SPINE FINDINGS Alignment: Mild reversal the normal cervical lordosis. 1.5 mm degenerative anterolisthesis at C3-4. Skull base and vertebrae: Spurring and loss of articular space at the anterior C1- 2 articulation. Degenerative loss of intervertebral disc height at C3-4 and C4-5. No acute cervical spine fracture. Soft tissues and spinal canal: Confluent nodularity in the somewhat and large left thyroid gland. Right thyroid lobe is absent. Rightward deviation of the trachea. Disc levels: Uncinate and facet spurring cause at least moderate right foraminal stenosis at C4-5, and borderline left foraminal stenosis at C3-4 and C4-5. Upper chest: Unremarkable Other: No supplemental non-categorized findings. IMPRESSION: 1. No acute intracranial findings are acute cervical spine findings. 2. Paranasal sinusitis with complete opacification of the left sphenoid sinus and chronic because all thickening in the ethmoid air cells. 3. Cervical  spondylosis, causing right foraminal impingement at C4-5. 4. Prior right thyroid lobe resection. Nodular and enlarged left thyroid lobe. This has been previously worked up for  example on prior thyroid ultrasound 08/04/2012. Electronically Signed   By: Van Clines M.D.   On: 04/27/2017 17:12   Ct Thoracic Spine Wo Contrast  Result Date: 04/27/2017 CLINICAL DATA:  Trip and fall at home. Possible multiple recent falls. EXAM: CT THORACIC AND LUMBAR SPINE WITHOUT CONTRAST TECHNIQUE: Multidetector CT imaging of the thoracic and lumbar spine was performed without contrast. Multiplanar CT image reconstructions were also generated. COMPARISON:  Lumbar spine radiographs November 29, 2016 and chest radiograph July 27 , 2018 FINDINGS: CT THORACIC SPINE FINDINGS ALIGNMENT: Maintained thoracic lordosis. No malalignment. VERTEBRAE: Moderate to severe T11 burst fracture with 50-75% height loss, 2 mm retropulsed bony fragments. Fracture was present July 2018, however fracture line present along superior endplate. The remaining thoracic vertebral bodies intact. Intervertebral disc heights generally preserved, multilevel intradiscal mineralization. Multilevel endplate sclerosis and spurring. Osteopenia. Scattered old Schmorl's nodes. Multilevel mild facet arthropathy. PARASPINAL AND OTHER SOFT TISSUES: Partially imaged thyromegaly displacing the trachea to the RIGHT. Calcific atherosclerosis aortic arch. Cardiomegaly. Small RIGHT pleural effusion. DISC LEVELS: Mild canal stenosis T10-11 due to retropulsed bony fragments. No osseous neural foraminal narrowing at any level. CT LUMBAR SPINE FINDINGS SEGMENTATION: For the purposes of this report the last well-formed intervertebral disc space is reported as L5-S1. ALIGNMENT: Maintained lumbar lordosis. No malalignment. VERTEBRAE: Vertebral bodies and posterior elements are intact. Intervertebral disc heights preserved, multilevel vacuum disc. No destructive bony lesions.  Osteopenia. PARASPINAL AND OTHER SOFT TISSUES: Included prevertebral and paraspinal soft tissues are unremarkable. DISC LEVELS: L1-2: No disc bulge, canal stenosis nor neural foraminal narrowing. L2-3: Small LEFT extraforaminal disc protrusion without canal stenosis or neural foraminal narrowing. L3-4: Moderate LEFT extraforaminal disc protrusion without canal stenosis. Moderate LEFT neural foraminal narrowing. L4-5: Small broad-based disc bulge, mild facet arthropathy and ligamentum flavum redundancy without canal stenosis or neural foraminal narrowing. L5-S1: Small broad-based disc bulge, mild facet arthropathy and ligamentum flavum redundancy without canal stenosis or neural foraminal narrowing. IMPRESSION: CT THORACIC SPINE IMPRESSION 1. Subacute T11 moderate to severe burst fracture resulting in mild canal stenosis. No malalignment. 2. Osteopenia. 3. Thyromegaly displacing the trachea to the RIGHT, incompletely characterized. CT LUMBAR SPINE IMPRESSION 1. No fracture or malalignment. 2. Osteopenia. 3. L3-4 disc protrusion results in moderate LEFT L3-4 neural foraminal narrowing. No canal stenosis at any level. Aortic Atherosclerosis (ICD10-I70.0). Electronically Signed   By: Elon Alas M.D.   On: 04/27/2017 17:06   Ct Lumbar Spine Wo Contrast  Result Date: 04/27/2017 CLINICAL DATA:  Trip and fall at home. Possible multiple recent falls. EXAM: CT THORACIC AND LUMBAR SPINE WITHOUT CONTRAST TECHNIQUE: Multidetector CT imaging of the thoracic and lumbar spine was performed without contrast. Multiplanar CT image reconstructions were also generated. COMPARISON:  Lumbar spine radiographs November 29, 2016 and chest radiograph July 27 , 2018 FINDINGS: CT THORACIC SPINE FINDINGS ALIGNMENT: Maintained thoracic lordosis. No malalignment. VERTEBRAE: Moderate to severe T11 burst fracture with 50-75% height loss, 2 mm retropulsed bony fragments. Fracture was present July 2018, however fracture line present along  superior endplate. The remaining thoracic vertebral bodies intact. Intervertebral disc heights generally preserved, multilevel intradiscal mineralization. Multilevel endplate sclerosis and spurring. Osteopenia. Scattered old Schmorl's nodes. Multilevel mild facet arthropathy. PARASPINAL AND OTHER SOFT TISSUES: Partially imaged thyromegaly displacing the trachea to the RIGHT. Calcific atherosclerosis aortic arch. Cardiomegaly. Small RIGHT pleural effusion. DISC LEVELS: Mild canal stenosis T10-11 due to retropulsed bony fragments. No osseous neural foraminal narrowing at any level. CT LUMBAR SPINE FINDINGS SEGMENTATION: For the purposes of this report  the last well-formed intervertebral disc space is reported as L5-S1. ALIGNMENT: Maintained lumbar lordosis. No malalignment. VERTEBRAE: Vertebral bodies and posterior elements are intact. Intervertebral disc heights preserved, multilevel vacuum disc. No destructive bony lesions. Osteopenia. PARASPINAL AND OTHER SOFT TISSUES: Included prevertebral and paraspinal soft tissues are unremarkable. DISC LEVELS: L1-2: No disc bulge, canal stenosis nor neural foraminal narrowing. L2-3: Small LEFT extraforaminal disc protrusion without canal stenosis or neural foraminal narrowing. L3-4: Moderate LEFT extraforaminal disc protrusion without canal stenosis. Moderate LEFT neural foraminal narrowing. L4-5: Small broad-based disc bulge, mild facet arthropathy and ligamentum flavum redundancy without canal stenosis or neural foraminal narrowing. L5-S1: Small broad-based disc bulge, mild facet arthropathy and ligamentum flavum redundancy without canal stenosis or neural foraminal narrowing. IMPRESSION: CT THORACIC SPINE IMPRESSION 1. Subacute T11 moderate to severe burst fracture resulting in mild canal stenosis. No malalignment. 2. Osteopenia. 3. Thyromegaly displacing the trachea to the RIGHT, incompletely characterized. CT LUMBAR SPINE IMPRESSION 1. No fracture or malalignment. 2.  Osteopenia. 3. L3-4 disc protrusion results in moderate LEFT L3-4 neural foraminal narrowing. No canal stenosis at any level. Aortic Atherosclerosis (ICD10-I70.0). Electronically Signed   By: Elon Alas M.D.   On: 04/27/2017 17:06   Dg Chest Port 1 View  Result Date: 05/21/2017 CLINICAL DATA:  Severe dyspnea EXAM: PORTABLE CHEST 1 VIEW COMPARISON:  04/30/2017 chest radiograph. FINDINGS: Low lung volumes. Stable cardiomediastinal silhouette with mild cardiomegaly and aortic atherosclerosis. No pneumothorax. Small right pleural effusion appears new. No left pleural effusion. Hazy and linear parahilar lung opacities, asymmetric to the right, favor mild pulmonary edema. Mild bibasilar atelectasis. IMPRESSION: 1. Stable cardiomegaly with hazy and linear parahilar lung opacities asymmetric to the right, favor mild pulmonary edema due to mild congestive heart failure. 2. Small right pleural effusion. 3. Low lung volumes with mild bibasilar atelectasis. Electronically Signed   By: Ilona Sorrel M.D.   On: 05/11/2017 17:14   Dg Chest Port 1 View  Result Date: 04/30/2017 CLINICAL DATA:  Cough EXAM: PORTABLE CHEST 1 VIEW COMPARISON:  April 27, 2017 FINDINGS: There is stable elevation of the right hemidiaphragm. There is no edema or consolidation. There is cardiomegaly with pulmonary vascular within normal limits. There is aortic atherosclerosis. No adenopathy. No bone lesions evident. Calcification is noted in the left carotid artery. IMPRESSION: No edema or consolidation. Stable cardiac prominence. There is aortic atherosclerosis as well as calcification in the left carotid artery. Aortic Atherosclerosis (ICD10-I70.0). Electronically Signed   By: Lowella Grip III M.D.   On: 04/30/2017 12:40   Dg Chest Portable 1 View  Result Date: 04/27/2017 CLINICAL DATA:  Status post fall today.  Weakness. EXAM: PORTABLE CHEST 1 VIEW COMPARISON:  March 01, 2017 FINDINGS: The mediastinal contour is stable. The heart  size is enlarged. Mild diffuse increased pulmonary interstitium is identified bilaterally. Chronic elevation of right hemidiaphragm is noted. There is no focal pneumonia or pleural effusion. The bony structures are stable. IMPRESSION: Mild increased pulmonary interstitium bilaterally which can be seen in mild interstitial edema. Cardiomegaly. Electronically Signed   By: Abelardo Diesel M.D.   On: 04/27/2017 17:10   Dg Shoulder Left  Result Date: 04/27/2017 CLINICAL DATA:  Status post fall today, pain of left shoulder. EXAM: LEFT SHOULDER - 2+ VIEW COMPARISON:  None. FINDINGS: There is mild displaced fracture of the proximal left humerus at the humeral neck. The visualized ribs and left lung are normal. IMPRESSION: Displaced fracture of proximal left humerus. Electronically Signed   By: Mallie Darting.D.  On: 04/27/2017 17:11   Dg Humerus Left  Result Date: 04/27/2017 CLINICAL DATA:  Status post fall today with left shoulder pain. EXAM: LEFT HUMERUS - 2+ VIEW COMPARISON:  None. FINDINGS: There is displaced fracture of the proximal left humerus. There is no dislocation. IMPRESSION: Fracture proximal left humerus. Electronically Signed   By: Abelardo Diesel M.D.   On: 04/27/2017 17:12     Microbiology: Recent Results (from the past 240 hour(s))  Blood Culture (routine x 2)     Status: Abnormal   Collection Time: 05/10/2017  4:12 PM  Result Value Ref Range Status   Specimen Description BLOOD RIGHT ANTECUBITAL  Final   Special Requests   Final    BOTTLES DRAWN AEROBIC AND ANAEROBIC Blood Culture adequate volume   Culture  Setup Time   Final    GRAM POSITIVE COCCI IN CLUSTERS IN BOTH AEROBIC AND ANAEROBIC BOTTLES CRITICAL RESULT CALLED TO, READ BACK BY AND VERIFIED WITH: G.ABBOTT PHARMD 05/04/17 0643 L.CHAMPION    Culture METHICILLIN RESISTANT STAPHYLOCOCCUS AUREUS (A)  Final   Report Status 05/06/2017 FINAL  Final   Organism ID, Bacteria METHICILLIN RESISTANT STAPHYLOCOCCUS AUREUS  Final       Susceptibility   Methicillin resistant staphylococcus aureus - MIC*    CIPROFLOXACIN >=8 RESISTANT Resistant     ERYTHROMYCIN >=8 RESISTANT Resistant     GENTAMICIN <=0.5 SENSITIVE Sensitive     OXACILLIN >=4 RESISTANT Resistant     TETRACYCLINE <=1 SENSITIVE Sensitive     VANCOMYCIN <=0.5 SENSITIVE Sensitive     TRIMETH/SULFA <=10 SENSITIVE Sensitive     CLINDAMYCIN <=0.25 SENSITIVE Sensitive     RIFAMPIN <=0.5 SENSITIVE Sensitive     Inducible Clindamycin NEGATIVE Sensitive     * METHICILLIN RESISTANT STAPHYLOCOCCUS AUREUS  Blood Culture ID Panel (Reflexed)     Status: Abnormal   Collection Time: 05/17/2017  4:12 PM  Result Value Ref Range Status   Enterococcus species NOT DETECTED NOT DETECTED Final   Listeria monocytogenes NOT DETECTED NOT DETECTED Final   Staphylococcus species DETECTED (A) NOT DETECTED Final    Comment: CRITICAL RESULT CALLED TO, READ BACK BY AND VERIFIED WITH: G.ABBOTT PHARMD 05/04/17 0643 L.CHAMPION    Staphylococcus aureus DETECTED (A) NOT DETECTED Final    Comment: Methicillin (oxacillin)-resistant Staphylococcus aureus (MRSA). MRSA is predictably resistant to beta-lactam antibiotics (except ceftaroline). Preferred therapy is vancomycin unless clinically contraindicated. Patient requires contact precautions if  hospitalized. CRITICAL RESULT CALLED TO, READ BACK BY AND VERIFIED WITH: G.ABBOTT PHARMD 05/04/17 0643 L.CHAMPION    Methicillin resistance DETECTED (A) NOT DETECTED Final    Comment: CRITICAL RESULT CALLED TO, READ BACK BY AND VERIFIED WITH: G.ABBOTT PHARMD 05/04/17 0643 L.CHAMPION    Streptococcus species NOT DETECTED NOT DETECTED Final   Streptococcus agalactiae NOT DETECTED NOT DETECTED Final   Streptococcus pneumoniae NOT DETECTED NOT DETECTED Final   Streptococcus pyogenes NOT DETECTED NOT DETECTED Final   Acinetobacter baumannii NOT DETECTED NOT DETECTED Final   Enterobacteriaceae species NOT DETECTED NOT DETECTED Final   Enterobacter cloacae  complex NOT DETECTED NOT DETECTED Final   Escherichia coli NOT DETECTED NOT DETECTED Final   Klebsiella oxytoca NOT DETECTED NOT DETECTED Final   Klebsiella pneumoniae NOT DETECTED NOT DETECTED Final   Proteus species NOT DETECTED NOT DETECTED Final   Serratia marcescens NOT DETECTED NOT DETECTED Final   Haemophilus influenzae NOT DETECTED NOT DETECTED Final   Neisseria meningitidis NOT DETECTED NOT DETECTED Final   Pseudomonas aeruginosa NOT DETECTED NOT DETECTED Final  Candida albicans NOT DETECTED NOT DETECTED Final   Candida glabrata NOT DETECTED NOT DETECTED Final   Candida krusei NOT DETECTED NOT DETECTED Final   Candida parapsilosis NOT DETECTED NOT DETECTED Final   Candida tropicalis NOT DETECTED NOT DETECTED Final  Blood Culture (routine x 2)     Status: Abnormal   Collection Time: 05/06/2017  4:25 PM  Result Value Ref Range Status   Specimen Description BLOOD RIGHT HAND  Final   Special Requests   Final    BOTTLES DRAWN AEROBIC AND ANAEROBIC Blood Culture adequate volume   Culture  Setup Time   Final    GRAM POSITIVE COCCI IN CLUSTERS IN BOTH AEROBIC AND ANAEROBIC BOTTLES CRITICAL VALUE NOTED.  VALUE IS CONSISTENT WITH PREVIOUSLY REPORTED AND CALLED VALUE.    Culture (A)  Final    STAPHYLOCOCCUS AUREUS SUSCEPTIBILITIES PERFORMED ON PREVIOUS CULTURE WITHIN THE LAST 5 DAYS.    Report Status 05/06/2017 FINAL  Final  Urine culture     Status: Abnormal   Collection Time: 05/04/17  1:10 AM  Result Value Ref Range Status   Specimen Description URINE, CLEAN CATCH  Final   Special Requests NONE  Final   Culture <10,000 COLONIES/mL (A)  Final   Report Status 05/05/2017 FINAL  Final  MRSA PCR Screening     Status: Abnormal   Collection Time: 05/04/17  3:07 PM  Result Value Ref Range Status   MRSA by PCR POSITIVE (A) NEGATIVE Final    Comment:        The GeneXpert MRSA Assay (FDA approved for NASAL specimens only), is one component of a comprehensive MRSA  colonization surveillance program. It is not intended to diagnose MRSA infection nor to guide or monitor treatment for MRSA infections. RESULT CALLED TO, READ BACK BY AND VERIFIED WITH: Candiss Norse RN 17:40 05/04/17 ([email or web address])   Culture, blood (routine x 2)     Status: Abnormal   Collection Time: 05/05/17  6:52 AM  Result Value Ref Range Status   Specimen Description BLOOD RIGHT HAND  Final   Special Requests IN PEDIATRIC BOTTLE Blood Culture adequate volume  Final   Culture  Setup Time   Final    GRAM POSITIVE COCCI IN CLUSTERS IN PEDIATRIC BOTTLE CRITICAL VALUE NOTED.  VALUE IS CONSISTENT WITH PREVIOUSLY REPORTED AND CALLED VALUE.    Culture (A)  Final    STAPHYLOCOCCUS AUREUS SUSCEPTIBILITIES PERFORMED ON PREVIOUS CULTURE WITHIN THE LAST 5 DAYS.    Report Status 05/06/2017 FINAL  Final  Culture, blood (routine x 2)     Status: Abnormal   Collection Time: 05/05/17  6:58 AM  Result Value Ref Range Status   Specimen Description BLOOD RIGHT THUMB  Final   Special Requests IN PEDIATRIC BOTTLE Blood Culture adequate volume  Final   Culture  Setup Time   Final    GRAM POSITIVE COCCI IN CLUSTERS IN PEDIATRIC BOTTLE CRITICAL VALUE NOTED.  VALUE IS CONSISTENT WITH PREVIOUSLY REPORTED AND CALLED VALUE.    Culture (A)  Final    STAPHYLOCOCCUS AUREUS SUSCEPTIBILITIES PERFORMED ON PREVIOUS CULTURE WITHIN THE LAST 5 DAYS.    Report Status 05/06/2017 FINAL  Final  Culture, blood (Routine X 2) w Reflex to ID Panel     Status: None (Preliminary result)   Collection Time: 05/06/17  8:42 AM  Result Value Ref Range Status   Specimen Description BLOOD RIGHT HAND  Final   Special Requests   Final    BOTTLES DRAWN AEROBIC AND ANAEROBIC Blood Culture  adequate volume   Culture NO GROWTH 2 DAYS  Final   Report Status PENDING  Incomplete  Culture, blood (Routine X 2) w Reflex to ID Panel     Status: Abnormal   Collection Time: 05/06/17  8:42 AM  Result Value Ref Range Status   Specimen  Description BLOOD RIGHT ANTECUBITAL  Final   Special Requests   Final    BOTTLES DRAWN AEROBIC AND ANAEROBIC Blood Culture adequate volume   Culture  Setup Time   Final    GRAM POSITIVE COCCI IN CLUSTERS ANAEROBIC BOTTLE ONLY CRITICAL RESULT CALLED TO, READ BACK BY AND VERIFIED WITH: M.MACCIA PHARMD May 23, 2017 0738 L.CHAMPION    Culture (A)  Final    STAPHYLOCOCCUS AUREUS SUSCEPTIBILITIES PERFORMED ON PREVIOUS CULTURE WITHIN THE LAST 5 DAYS.    Report Status 05/08/2017 FINAL  Final     Labs: BNP (last 3 results)  Recent Labs  02/26/17 1743  BNP 2,637.8*   Basic Metabolic Panel:  Recent Labs Lab 05/23/2017 2127 05/04/17 0802 05/04/17 2049 05/05/17 0652 05/06/17 0527 05/23/2017 0546  NA 119* 124*  --  124* 125* 127*  K 5.6* 5.9* 5.5* 5.1 4.7 4.4  CL 88* 90*  --  89* 89* 90*  CO2 23 19*  --  24 23 23   GLUCOSE 151* 133*  --  103* 105* 120*  BUN 44* 44*  --  58* 72* 89*  CREATININE 2.50* 2.21*  --  1.97* 2.49* 2.37*  CALCIUM 7.7* 7.9*  --  7.9* 7.6* 7.9*  MG  --   --   --   --  1.7  --    Liver Function Tests:  Recent Labs Lab 05/26/2017 1616 05/05/17 0652  AST 19 15  ALT 10* 13*  ALKPHOS 56 60  BILITOT 1.1 0.9  PROT 5.5* 5.3*  ALBUMIN 2.6* 2.1*   No results for input(s): LIPASE, AMYLASE in the last 168 hours. No results for input(s): AMMONIA in the last 168 hours. CBC:  Recent Labs Lab 05/06/2017 1616  05/30/2017 2127 05/04/17 0802 05/05/17 0652 05/06/17 0527 05/23/2017 0546  WBC 15.7*  --  21.9* 20.6* 15.1* 14.7* 17.6*  NEUTROABS 14.7*  --   --   --   --   --   --   HGB 8.6*  < > 8.7* 9.6* 8.1* 7.6* 8.0*  HCT 26.7*  < > 26.9* 29.6* 25.5* 22.4* 24.4*  MCV 85.6  --  86.2 86.3 85.3 83.9 82.7  PLT 187  --  193 175 203 178 188  < > = values in this interval not displayed. Cardiac Enzymes:  Recent Labs Lab 05/24/2017 2127 05/04/17 0314 05/04/17 0802 05/23/2017 0546  CKTOTAL  --   --   --  41  TROPONINI 0.54* 0.31* 0.33*  --    BNP: Invalid input(s):  POCBNP CBG: No results for input(s): GLUCAP in the last 168 hours. D-Dimer No results for input(s): DDIMER in the last 72 hours. Hgb A1c No results for input(s): HGBA1C in the last 72 hours. Lipid Profile No results for input(s): CHOL, HDL, LDLCALC, TRIG, CHOLHDL, LDLDIRECT in the last 72 hours. Thyroid function studies No results for input(s): TSH, T4TOTAL, T3FREE, THYROIDAB in the last 72 hours.  Invalid input(s): FREET3 Anemia work up No results for input(s): VITAMINB12, FOLATE, FERRITIN, TIBC, IRON, RETICCTPCT in the last 72 hours. Urinalysis    Component Value Date/Time   COLORURINE YELLOW 05/04/2017 0110   APPEARANCEUR CLEAR 05/04/2017 0110   LABSPEC 1.012 05/04/2017 0110  PHURINE 6.0 05/04/2017 0110   GLUCOSEU NEGATIVE 05/04/2017 0110   HGBUR NEGATIVE 05/04/2017 0110   HGBUR trace-intact 06/25/2010 1028   BILIRUBINUR NEGATIVE 05/04/2017 0110   BILIRUBINUR Negative 12/07/2016 1535   KETONESUR NEGATIVE 05/04/2017 0110   PROTEINUR NEGATIVE 05/04/2017 0110   UROBILINOGEN 0.2 12/07/2016 1535   UROBILINOGEN 0.2 06/25/2010 1028   NITRITE NEGATIVE 05/04/2017 0110   LEUKOCYTESUR SMALL (A) 05/04/2017 0110   Sepsis Labs Invalid input(s): PROCALCITONIN,  WBC,  LACTICIDVEN  SIGNED:  Marzetta Board, MD  [facility] 05/08/2017, 2:55 PM Pager [phone]  If 7PM-7AM, please contact night-coverage [email or web address] Password [email or web address]

## 2017-05-08 NOTE — Progress Notes (Signed)
Called family the third time. No response.

## 2017-05-08 NOTE — Progress Notes (Signed)
Attempted to call family again, still no response.

## 2017-05-08 NOTE — Progress Notes (Signed)
Called family left message to call back.

## 2017-05-08 NOTE — Progress Notes (Signed)
Called Medical Examiner on call to verify if pt is an ME case. Talked to [employer], denies pt a ME case.

## 2017-05-11 LAB — CULTURE, BLOOD (ROUTINE X 2)
CULTURE: NO GROWTH
SPECIAL REQUESTS: ADEQUATE

## 2017-05-16 ENCOUNTER — Other Ambulatory Visit: Payer: Self-pay | Admitting: Family Medicine

## 2017-05-19 ENCOUNTER — Telehealth: Payer: Self-pay | Admitting: Cardiovascular Disease

## 2017-05-19 NOTE — Telephone Encounter (Signed)
[employer] forms completed and placed in outgoing box.

## 2017-05-20 ENCOUNTER — Ambulatory Visit: Payer: Medicare Other | Admitting: Cardiovascular Disease

## 2017-05-20 NOTE — Telephone Encounter (Signed)
Sent completed [employer] forms via interoffice.

## 2017-06-03 NOTE — Progress Notes (Signed)
Called daughter Maudie Mercury 2X to let her know about her mothers transfer to [phone]. Message left to call RN back.

## 2017-06-03 NOTE — Care Management Important Message (Signed)
Important Message  Patient Details  Name: ALANEA WOOLRIDGE MRN: [id number] Date of Birth: [birth year]   Medicare Important Message Given:  Yes    Zenon Mayo, RN 06-06-2017, 12:43 [place] Message  Patient Details  Name: TAIJA MATHIAS MRN: [id number] Date of Birth: [birth year]   Medicare Important Message Given:  Yes    Zenon Mayo, RN 06/06/2017, 12:42 PM

## 2017-06-03 NOTE — Progress Notes (Signed)
SLP Cancellation Note  Patient Details Name: Kimberly [name] MRN: [id number] DOB: [birth year]   Cancelled treatment:       Reason Eval/Treat Not Completed: Medical issues which prohibited therapy - on BiPAP this morning. Would continue to hold all POs.    Germain Osgood 2017/05/19, 8:30 AM  Germain Osgood, M.A. CCC-SLP [phone]

## 2017-06-03 NOTE — Progress Notes (Signed)
Progress Note  Patient Name: Kimberly [name] Date of Encounter: 08-May-2017  Primary Cardiologist: Rockey Situ  Subjective   Less responsive, moves and groans to stimulation. AFib w borderline rate control (95-105). Echo without visible vegetations and without any significant valvular regurgitant lesions. EF unchanged at about 35%. Mdiocre urine output and unable to take PO. 2000 mL +ve since admission. Seems to be at least 10 lb, maybe 20 lb above usual "dry weight". WBC worsening. Abnormal renal function, anemia and hyponatremia largely unchanged.  Inpatient Medications    Scheduled Meds: . aspirin EC  81 mg Oral Daily  . calcitonin (salmon)  1 spray Alternating Nares Daily  . Chlorhexidine Gluconate Cloth  6 each Topical Q0600  . heparin  5,000 Units Subcutaneous Q8H  . levothyroxine  25 mcg Oral QAC breakfast  . mouth rinse  15 mL Mouth Rinse BID  . mupirocin ointment  1 application Nasal BID  . senna-docusate  1 tablet Oral BID   Continuous Infusions: . amiodarone 30 mg/hr (05/08/2017 0041)  . DAPTOmycin (CUBICIN)  IV Stopped (05/06/17 1300)   PRN Meds: acetaminophen, ondansetron **OR** ondansetron (ZOFRAN) IV, oxyCODONE, polyethylene glycol, traMADol   Vital Signs    Vitals:   May 08, 2017 0700 05/08/2017 0736 05-08-17 0744 2017-05-08 0800  BP: 113/60 113/60 113/60 120/76  Pulse: (!) 105 (!) 105 (!) 108 (!) 116  Resp: (!) 31 (!) 28 (!) 32 (!) 33  Temp:  (!) 101.9 F (38.8 C)    TempSrc:  Oral    SpO2: 97% 97% 97% 96%  Weight:      Height:        Intake/Output Summary (Last 24 hours) at 08-May-2017 0933 Last data filed at 2017/05/08 0600  Gross per 24 hour  Intake           649.41 ml  Output              775 ml  Net          -125.59 ml   Filed Weights   05/05/17 0438 05/06/17 0500 05-08-17 0208  Weight: 187 lb (84.8 kg) 198 lb 6.6 oz (90 kg) 190 lb (86.2 kg)    Telemetry    Afib - Personally Reviewed  ECG    No new tracing - Personally Reviewed  Physical Exam    Poorly responsive GEN: No acute distress.   Neck: No JVD Cardiac: irregular, no murmurs, rubs, or gallops.  Respiratory: Clear to auscultation bilaterally. GI: Soft, nontender, non-distended  MS: No edema; No deformity. Neuro:  unable to evaluate Psych: unable to evaluate  Labs    Chemistry Recent Labs Lab 05/16/2017 1616  05/05/17 0652 05/06/17 0527 05-08-17 0546  NA 119*  < > 124* 125* 127*  K 6.0*  < > 5.1 4.7 4.4  CL 83*  < > 89* 89* 90*  CO2 25  < > 24 23 23   GLUCOSE 148*  < > 103* 105* 120*  BUN 44*  < > 58* 72* 89*  CREATININE 2.63*  < > 1.97* 2.49* 2.37*  CALCIUM 7.8*  < > 7.9* 7.6* 7.9*  PROT 5.5*  --  5.3*  --   --   ALBUMIN 2.6*  --  2.1*  --   --   AST 19  --  15  --   --   ALT 10*  --  13*  --   --   ALKPHOS 56  --  60  --   --   BILITOT  1.1  --  0.9  --   --   GFRNONAA 15*  < > 22* 16* 17*  GFRAA 18*  < > 25* 19* 20*  ANIONGAP 11  < > 11 13 14   < > = values in this interval not displayed.   Hematology Recent Labs Lab 05/05/17 0652 05/06/17 0527 30-May-2017 0546  WBC 15.1* 14.7* 17.6*  RBC 2.99* 2.67* 2.95*  HGB 8.1* 7.6* 8.0*  HCT 25.5* 22.4* 24.4*  MCV 85.3 83.9 82.7  MCH 27.1 28.5 27.1  MCHC 31.8 33.9 32.8  RDW 14.3 14.8 14.4  PLT 203 178 188    Cardiac Enzymes Recent Labs Lab 05/14/2017 2127 05/04/17 0314 05/04/17 0802  TROPONINI 0.54* 0.31* 0.33*   No results for input(s): TROPIPOC in the last 168 hours.   BNPNo results for input(s): BNP, PROBNP in the last 168 hours.   DDimer No results for input(s): DDIMER in the last 168 hours.   Radiology    Dg Wrist 2 Views Left  Result Date: 05/05/2017 CLINICAL DATA:  Left wrist pain and swelling. EXAM: LEFT WRIST - 2 VIEW COMPARISON:  None. FINDINGS: There is no evidence of fracture or dislocation. There is no evidence of arthropathy or other focal bone abnormality. Soft tissues are unremarkable. IMPRESSION: Normal left wrist. Electronically Signed   By: Marijo Conception, M.D.   On: 05/05/2017  16:43    Cardiac Studies   ECHO 05/06/2017 - Left ventricle: The cavity size was normal. Wall thickness was   normal. Systolic function was moderately to severely reduced. The   estimated ejection fraction was in the range of 30% to 35%. - Ventricular septum: Septal motion showed paradox. - Aortic valve: Moderately calcified annulus. - Mitral valve: Mildly calcified annulus. - Left atrium: The atrium was moderately dilated.  ECHO 02/27/2017 - Left ventricle: The cavity size was severely dilated. There was   mild concentric hypertrophy. Systolic function was moderately to   severely reduced. The estimated ejection fraction was 35%.   Diffuse hypokinesis. - Aortic valve: There was mild regurgitation. - Mitral valve: There was mild regurgitation. - Pulmonary arteries: PA peak pressure: 36 mm Hg (S).  Impressions:  - The right ventricular systolic pressure was increased consistent   with mild pulmonary hypertension. Severely dilated left atrium   and left ventricle with severe left ventricular systolic   dysfunction] LVEF 33-35%, with diffuse hypokinesis. Severe mitral   annular calcification with mild to moderate mitral regurgitation   and mild pulmonary hypertension. Fibrocalcific 5 aortic valve   without any significant aortic stenosis but mild aortic   regurgitation. There is grade 2 diastolic dysfunction.  CATH 03/03/2017  Mid RCA to Dist RCA lesion, 30 %stenosed.  The left ventricular ejection fraction is 45-50% by visual estimate.  There is no mitral valve regurgitation.  There is mild left ventricular systolic dysfunction.  LV end diastolic pressure is normal.  Prox LAD lesion, 30 %stenosed.   Patient Profile     83 y.o. female with chronic systolic heart failure and new onset atrial fibrillation with rapid ventricular response in the setting of MRSA sepsis, acute renal insufficiency, severe hyponatremia.  Assessment & Plan    1. New-onset Atrial  Fibrillation - acceptable ventricular rate control on IV amiodarone (BP was low on metoprolol, unable to take PO meds) - CHA2DS2-VASc Score and unadjusted Ischemic Stroke Rate (% per year) is equal to 9.7 % stroke rate/year from a score of 6(CHF, HTN, Coronary Calcifications, Female, Age (2), but very  poor candidate for long-term anticoagulation due to overall frailty, frequent falls, volatile renal function, acute anemia. 2. CAD/ Elevated Troponin  - she has not had angina and she only had nonobstructive CAD by cath in 03/2017. Echo does not show regional wall motion abnormalities and EF is unchanged. - troponin abnormality is consistent with demand ischemia, not a true acute coronary syndrome. 3. Acute on chronic combined systolic and diastolic CHF - EF 26% by echo in 01/2017, 30-35% on current echo.  - acute exacerbation related to atrial fibrillation and volume overload, secondary to intravenous fluids administrated for sepsis.  - she is clearly hypervolemic.  - poor response to diuretic given yesterday, will try a higher dose today. If aggressive care is to be continued, would recommend Nephrology consultation. - RAAS inhibitors on hold due to acute renal insufficiency. 4. Acute on Chronic Stage 3 CKD - baseline creatinine 1.0-1.1. - borderline oliguric - If aggressive care is to be continued, would recommend Nephrology consultation. 5. MRSA bacteremia/sepsis - ID is following. Blood cultures from October 2 and October 3 persistently positive, raising concern for bloodstream infection/endocarditis. - no vegetations on TTE and no serious valve regurgitation. - endocarditis is definitely not excluded and TEE would be preferred, but her respiratory status will not allow that at this time. A firm diagnosis of endocarditis will only impact the duration of therapy, not the choice of therapy. Consider TEE next week if her respiratory status improves. 6. Hyponatremia - Na+ 119 on admission,  improved to 127.  Overall prognosis appears poor. Yesterday, her family reaffirmed the patient has repeatedly expressed a desire for DNR status.  For questions or updates, please contact [place] Please consult [email or web address] for contact info under Cardiology/STEMI.      Signed, Sanda Klein, MD  May 18, 2017, 9:33 AM

## 2017-06-03 NOTE — Progress Notes (Signed)
Attempted report. Room still being cleaned. Will call back.

## 2017-06-03 NOTE — Progress Notes (Signed)
PROGRESS NOTE  Kimberly [name] RXV:[id number] DOB: 05-12-1928 DOA: 05/04/2017 PCP: Abner Greenspan, MD   LOS: 4 days   Brief Narrative / Interim history: 83 year old lady with prior h/o chronic hyponatremia, hypertension, CAD,  Dementia, chronic systolic and diastolic heart failure recently discharged from [facility], comes in for acute respiratory failure with hypoxia and acute encephalopathy.  She was found to have MRSA bacteremia.  Hospital course complicated by new onset A. fib with RVR for which cardiology was consulted.  She is also having worsening renal failure.  Assessment & Plan: Principal Problem:   Sepsis ([place]) Active Problems:   Anxiety   Essential hypertension   LBBB (left bundle branch block)   Frequent UTI   Obesity   Hyponatremia   Chronic combined systolic and diastolic heart failure (HCC)   Hyperkalemia   Acute hypoxemic respiratory failure (HCC)   Septic shock (HCC)   MRSA bacteremia   Fracture of anatomical neck of humerus, right, closed, initial encounter   Stable burst fracture of T10 vertebra with delayed healing   New onset atrial fibrillation (HCC)   Acute respiratory failure with hypoxia possibly from mild pulmonary edema from acute on chronic systolic heart failure -Most recent 2D echo was done in July 2018 and showed an EF of 35% -pt came in hypotensive on arrival and did not receive any diuretics.  -Repeat 2D echo shows an EF of 30-35%  Severe sepsis due to MRSA bacteremia -Patient was hypotensive, febrile, tachypneic on admission meeting criteria. -On vancomycin since 10/1, infectious disease following, appreciate input, changed to daptomycin due to worsening renal failure -White count is increasing, patient's fevers persist  Atrial fibrillation with RVR -patient's CHA2DS2-VASc Score for Stroke Risk is > 2 -This appears to be new as I do not see any evidence of A. fib in outpatient cardiology notes. -Doubt she is a candidate for anticoagulation with her  deconditioning and recent fall -Consulted cardiology, appreciate input, poorly controlled rates and patient was started on amiodarone  -Rates improved today in the 90s  Coronary artery disease -Followed by Dr. Rockey Situ in [place], also recently had a cardiac catheterization in August 2018 which showed RCA 30% stenosed, proximal 30% LAD stenosis -Troponin elevation likely due to demand ischemia in the setting of sepsis  Acute kidney injury on CKD III, hyperkalemia, and hyponatremia -Hyponatremia is somewhat chronic, likely worse due to fluid overload -Baseline creatinine 1.1-1.2, worsening now in the setting of sepsis, hypertension, A. fib with RVR, vancomycin use -Creatinine worsened acutely yesterday, remains in the mid 2 range today as well  Acute encephalopathy  -possibly from acute respiratory failure and sepsis  Hypertension -Now hypotensive as above   Hypothyroidism -We will change to iv Synthroid tomorrow  Goals of care -Discussed with daughter at bedside 10/4, the patient has had progressive decline in the setting of MRSA bacteremia, heart failure decompensation, new onset A. fib with RVR and renal failure.  She is becoming more encephalopathic, and her prognosis is very guarded at this point.  I recommended palliative care consultation to further discuss goals of care, consulted today   DVT prophylaxis: heparin Code Status: DNR Family Communication: no family at bedside Disposition Plan: TBD  Consultants:   ID  Cardiology   Procedures:   2D echo: pending  Antimicrobials:  Vancomycin 05/22/2017 >>  Subjective: -lethargic, poorly responsive today  Objective: Vitals:   12-May-2017 1100 May 12, 2017 1132 12-May-2017 1140 12-May-2017 1200  BP: (!) 125/55 (!) 125/55  118/66  Pulse: 82 98  91 96  Resp: (!) 39 (!) 31 (!) 28 (!) 28  Temp:  98.4 F (36.9 C)    TempSrc:  Axillary    SpO2: 97% 96% 94% 95%  Weight:      Height:        Intake/Output Summary (Last 24  hours) at 2017-05-13 1343 Last data filed at May 13, 2017 0600  Gross per 24 hour  Intake           649.41 ml  Output              700 ml  Net           -50.59 ml   Filed Weights   05/05/17 0438 05/06/17 0500 05/13/17 0208  Weight: 84.8 kg (187 lb) 90 kg (198 lb 6.6 oz) 86.2 kg (190 lb)    Examination:  Constitutional: Lethargic Eyes: Lids and conjunctivae normal Respiratory: Decreased breath sounds, poor respiratory effort, BiPAP on Cardiovascular: Irregular, tachycardic, trace lower extremity edema Abdomen: Decreased bowel sounds, no apparent tenderness Skin: No rashes, ulcers Neurologic: Spontaneously moves all 4, does not follow commands    Data Reviewed: I have independently reviewed following labs and imaging studies   CBC:  Recent Labs Lab 05/01/17 0530 06/01/2017 1616  05/27/2017 2127 05/04/17 0802 05/05/17 0652 05/06/17 0527 May 13, 2017 0546  WBC 7.6 15.7*  --  21.9* 20.6* 15.1* 14.7* 17.6*  NEUTROABS 6.4 14.7*  --   --   --   --   --   --   HGB 9.5* 8.6*  < > 8.7* 9.6* 8.1* 7.6* 8.0*  HCT 28.3* 26.7*  < > 26.9* 29.6* 25.5* 22.4* 24.4*  MCV 87.6 85.6  --  86.2 86.3 85.3 83.9 82.7  PLT 173 187  --  193 175 203 178 188  < > = values in this interval not displayed. Basic Metabolic Panel:  Recent Labs Lab 05/22/2017 2127 05/04/17 0802 05/04/17 2049 05/05/17 0652 05/06/17 0527 05/13/2017 0546  NA 119* 124*  --  124* 125* 127*  K 5.6* 5.9* 5.5* 5.1 4.7 4.4  CL 88* 90*  --  89* 89* 90*  CO2 23 19*  --  24 23 23   GLUCOSE 151* 133*  --  103* 105* 120*  BUN 44* 44*  --  58* 72* 89*  CREATININE 2.50* 2.21*  --  1.97* 2.49* 2.37*  CALCIUM 7.7* 7.9*  --  7.9* 7.6* 7.9*  MG  --   --   --   --  1.7  --    GFR: Estimated Creatinine Clearance: 17 mL/min (A) (by C-G formula based on SCr of 2.37 mg/dL (H)). Liver Function Tests:  Recent Labs Lab 05/18/2017 1616 05/05/17 0652  AST 19 15  ALT 10* 13*  ALKPHOS 56 60  BILITOT 1.1 0.9  PROT 5.5* 5.3*  ALBUMIN 2.6* 2.1*    No results for input(s): LIPASE, AMYLASE in the last 168 hours. No results for input(s): AMMONIA in the last 168 hours. Coagulation Profile:  Recent Labs Lab 05/13/2017 2127  INR 1.31   Cardiac Enzymes:  Recent Labs Lab 05/26/2017 2127 05/04/17 0314 05/04/17 0802 13-May-2017 0546  CKTOTAL  --   --   --  41  TROPONINI 0.54* 0.31* 0.33*  --    BNP (last 3 results)  Recent Labs  04/15/17 1307  PROBNP 3,962.0*   HbA1C: No results for input(s): HGBA1C in the last 72 hours. CBG: No results for input(s): GLUCAP in the last 168 hours. Lipid Profile: No results  for input(s): CHOL, HDL, LDLCALC, TRIG, CHOLHDL, LDLDIRECT in the last 72 hours. Thyroid Function Tests: No results for input(s): TSH, T4TOTAL, FREET4, T3FREE, THYROIDAB in the last 72 hours. Anemia Panel: No results for input(s): VITAMINB12, FOLATE, FERRITIN, TIBC, IRON, RETICCTPCT in the last 72 hours. Urine analysis:    Component Value Date/Time   COLORURINE YELLOW 05/04/2017 0110   APPEARANCEUR CLEAR 05/04/2017 0110   LABSPEC 1.012 05/04/2017 0110   PHURINE 6.0 05/04/2017 0110   GLUCOSEU NEGATIVE 05/04/2017 0110   HGBUR NEGATIVE 05/04/2017 0110   HGBUR trace-intact 06/25/2010 1028   BILIRUBINUR NEGATIVE 05/04/2017 0110   BILIRUBINUR Negative 12/07/2016 1535   KETONESUR NEGATIVE 05/04/2017 0110   PROTEINUR NEGATIVE 05/04/2017 0110   UROBILINOGEN 0.2 12/07/2016 1535   UROBILINOGEN 0.2 06/25/2010 1028   NITRITE NEGATIVE 05/04/2017 0110   LEUKOCYTESUR SMALL (A) 05/04/2017 0110   Sepsis Labs: Invalid input(s): PROCALCITONIN, LACTICIDVEN  Recent Results (from the past 240 hour(s))  Blood Culture (routine x 2)     Status: Abnormal   Collection Time: 05/14/2017  4:12 PM  Result Value Ref Range Status   Specimen Description BLOOD RIGHT ANTECUBITAL  Final   Special Requests   Final    BOTTLES DRAWN AEROBIC AND ANAEROBIC Blood Culture adequate volume   Culture  Setup Time   Final    GRAM POSITIVE COCCI IN  CLUSTERS IN BOTH AEROBIC AND ANAEROBIC BOTTLES CRITICAL RESULT CALLED TO, READ BACK BY AND VERIFIED WITH: G.ABBOTT PHARMD 05/04/17 0643 L.CHAMPION    Culture METHICILLIN RESISTANT STAPHYLOCOCCUS AUREUS (A)  Final   Report Status 05/06/2017 FINAL  Final   Organism ID, Bacteria METHICILLIN RESISTANT STAPHYLOCOCCUS AUREUS  Final      Susceptibility   Methicillin resistant staphylococcus aureus - MIC*    CIPROFLOXACIN >=8 RESISTANT Resistant     ERYTHROMYCIN >=8 RESISTANT Resistant     GENTAMICIN <=0.5 SENSITIVE Sensitive     OXACILLIN >=4 RESISTANT Resistant     TETRACYCLINE <=1 SENSITIVE Sensitive     VANCOMYCIN <=0.5 SENSITIVE Sensitive     TRIMETH/SULFA <=10 SENSITIVE Sensitive     CLINDAMYCIN <=0.25 SENSITIVE Sensitive     RIFAMPIN <=0.5 SENSITIVE Sensitive     Inducible Clindamycin NEGATIVE Sensitive     * METHICILLIN RESISTANT STAPHYLOCOCCUS AUREUS  Blood Culture ID Panel (Reflexed)     Status: Abnormal   Collection Time: 05/09/2017  4:12 PM  Result Value Ref Range Status   Enterococcus species NOT DETECTED NOT DETECTED Final   Listeria monocytogenes NOT DETECTED NOT DETECTED Final   Staphylococcus species DETECTED (A) NOT DETECTED Final    Comment: CRITICAL RESULT CALLED TO, READ BACK BY AND VERIFIED WITH: G.ABBOTT PHARMD 05/04/17 0643 L.CHAMPION    Staphylococcus aureus DETECTED (A) NOT DETECTED Final    Comment: Methicillin (oxacillin)-resistant Staphylococcus aureus (MRSA). MRSA is predictably resistant to beta-lactam antibiotics (except ceftaroline). Preferred therapy is vancomycin unless clinically contraindicated. Patient requires contact precautions if  hospitalized. CRITICAL RESULT CALLED TO, READ BACK BY AND VERIFIED WITH: G.ABBOTT PHARMD 05/04/17 0643 L.CHAMPION    Methicillin resistance DETECTED (A) NOT DETECTED Final    Comment: CRITICAL RESULT CALLED TO, READ BACK BY AND VERIFIED WITH: G.ABBOTT PHARMD 05/04/17 0643 L.CHAMPION    Streptococcus species NOT DETECTED NOT  DETECTED Final   Streptococcus agalactiae NOT DETECTED NOT DETECTED Final   Streptococcus pneumoniae NOT DETECTED NOT DETECTED Final   Streptococcus pyogenes NOT DETECTED NOT DETECTED Final   Acinetobacter baumannii NOT DETECTED NOT DETECTED Final   Enterobacteriaceae species NOT DETECTED NOT DETECTED  Final   Enterobacter cloacae complex NOT DETECTED NOT DETECTED Final   Escherichia coli NOT DETECTED NOT DETECTED Final   Klebsiella oxytoca NOT DETECTED NOT DETECTED Final   Klebsiella pneumoniae NOT DETECTED NOT DETECTED Final   Proteus species NOT DETECTED NOT DETECTED Final   Serratia marcescens NOT DETECTED NOT DETECTED Final   Haemophilus influenzae NOT DETECTED NOT DETECTED Final   Neisseria meningitidis NOT DETECTED NOT DETECTED Final   Pseudomonas aeruginosa NOT DETECTED NOT DETECTED Final   Candida albicans NOT DETECTED NOT DETECTED Final   Candida glabrata NOT DETECTED NOT DETECTED Final   Candida krusei NOT DETECTED NOT DETECTED Final   Candida parapsilosis NOT DETECTED NOT DETECTED Final   Candida tropicalis NOT DETECTED NOT DETECTED Final  Blood Culture (routine x 2)     Status: Abnormal   Collection Time: 05/15/2017  4:25 PM  Result Value Ref Range Status   Specimen Description BLOOD RIGHT HAND  Final   Special Requests   Final    BOTTLES DRAWN AEROBIC AND ANAEROBIC Blood Culture adequate volume   Culture  Setup Time   Final    GRAM POSITIVE COCCI IN CLUSTERS IN BOTH AEROBIC AND ANAEROBIC BOTTLES CRITICAL VALUE NOTED.  VALUE IS CONSISTENT WITH PREVIOUSLY REPORTED AND CALLED VALUE.    Culture (A)  Final    STAPHYLOCOCCUS AUREUS SUSCEPTIBILITIES PERFORMED ON PREVIOUS CULTURE WITHIN THE LAST 5 DAYS.    Report Status 05/06/2017 FINAL  Final  Urine culture     Status: Abnormal   Collection Time: 05/04/17  1:10 AM  Result Value Ref Range Status   Specimen Description URINE, CLEAN CATCH  Final   Special Requests NONE  Final   Culture <10,000 COLONIES/mL (A)  Final    Report Status 05/05/2017 FINAL  Final  MRSA PCR Screening     Status: Abnormal   Collection Time: 05/04/17  3:07 PM  Result Value Ref Range Status   MRSA by PCR POSITIVE (A) NEGATIVE Final    Comment:        The GeneXpert MRSA Assay (FDA approved for NASAL specimens only), is one component of a comprehensive MRSA colonization surveillance program. It is not intended to diagnose MRSA infection nor to guide or monitor treatment for MRSA infections. RESULT CALLED TO, READ BACK BY AND VERIFIED WITH: Candiss Norse RN 17:40 05/04/17 ([email or web address])   Culture, blood (routine x 2)     Status: Abnormal   Collection Time: 05/05/17  6:52 AM  Result Value Ref Range Status   Specimen Description BLOOD RIGHT HAND  Final   Special Requests IN PEDIATRIC BOTTLE Blood Culture adequate volume  Final   Culture  Setup Time   Final    GRAM POSITIVE COCCI IN CLUSTERS IN PEDIATRIC BOTTLE CRITICAL VALUE NOTED.  VALUE IS CONSISTENT WITH PREVIOUSLY REPORTED AND CALLED VALUE.    Culture (A)  Final    STAPHYLOCOCCUS AUREUS SUSCEPTIBILITIES PERFORMED ON PREVIOUS CULTURE WITHIN THE LAST 5 DAYS.    Report Status 05/06/2017 FINAL  Final  Culture, blood (routine x 2)     Status: Abnormal   Collection Time: 05/05/17  6:58 AM  Result Value Ref Range Status   Specimen Description BLOOD RIGHT THUMB  Final   Special Requests IN PEDIATRIC BOTTLE Blood Culture adequate volume  Final   Culture  Setup Time   Final    GRAM POSITIVE COCCI IN CLUSTERS IN PEDIATRIC BOTTLE CRITICAL VALUE NOTED.  VALUE IS CONSISTENT WITH PREVIOUSLY REPORTED AND CALLED VALUE.    Culture (A)  Final    STAPHYLOCOCCUS AUREUS SUSCEPTIBILITIES PERFORMED ON PREVIOUS CULTURE WITHIN THE LAST 5 DAYS.    Report Status 05/06/2017 FINAL  Final  Culture, blood (Routine X 2) w Reflex to ID Panel     Status: None (Preliminary result)   Collection Time: 05/06/17  8:42 AM  Result Value Ref Range Status   Specimen Description BLOOD RIGHT ANTECUBITAL  Final    Special Requests   Final    BOTTLES DRAWN AEROBIC AND ANAEROBIC Blood Culture adequate volume   Culture  Setup Time   Final    GRAM POSITIVE COCCI IN CLUSTERS ANAEROBIC BOTTLE ONLY CRITICAL RESULT CALLED TO, READ BACK BY AND VERIFIED WITH: M.MACCIA PHARMD 05/25/17 0738 L.CHAMPION    Culture GRAM POSITIVE COCCI IN CLUSTERS  Final   Report Status PENDING  Incomplete      Radiology Studies: Dg Wrist 2 Views Left  Result Date: 05/05/2017 CLINICAL DATA:  Left wrist pain and swelling. EXAM: LEFT WRIST - 2 VIEW COMPARISON:  None. FINDINGS: There is no evidence of fracture or dislocation. There is no evidence of arthropathy or other focal bone abnormality. Soft tissues are unremarkable. IMPRESSION: Normal left wrist. Electronically Signed   By: Marijo Conception, M.D.   On: 05/05/2017 16:43     Scheduled Meds: . aspirin EC  81 mg Oral Daily  . calcitonin (salmon)  1 spray Alternating Nares Daily  . Chlorhexidine Gluconate Cloth  6 each Topical Q0600  . heparin  5,000 Units Subcutaneous Q8H  . levothyroxine  25 mcg Oral QAC breakfast  . mouth rinse  15 mL Mouth Rinse BID  . mupirocin ointment  1 application Nasal BID  . senna-docusate  1 tablet Oral BID   Continuous Infusions: . amiodarone 30 mg/hr (25-May-2017 1240)  . DAPTOmycin (CUBICIN)  IV Stopped (05/06/17 1300)    Marzetta Board, MD, PhD [facility] Pager [phone]  If 7PM-7AM, please contact night-coverage [email or web address] Password [email or web address] May 25, 2017, 1:43 PM

## 2017-06-03 NOTE — Progress Notes (Signed)
Text page to Dr Cruzita Lederer - pt's WBC elevated today and has an AX temp of 101.9 Still on Bipap

## 2017-06-03 NOTE — Telephone Encounter (Signed)
Received [employer] form for Dr. Rockey Situ to review and sign, given to Jule Ser, RN

## 2017-06-03 NOTE — Progress Notes (Signed)
Received pt in bed. Moaning in pain. Bolus administered. Oral care provided, suction swab done. Repositioned pt comfortably.

## 2017-06-03 NOTE — Progress Notes (Signed)
Subjective: Pt is unrepsonsive   Antibiotics:  Anti-infectives    Start     Dose/Rate Route Frequency Ordered Stop   05/06/17 1200  DAPTOmycin (CUBICIN) 720 mg in sodium chloride 0.9 % IVPB  Status:  Discontinued     8 mg/kg  90 kg 228.8 mL/hr over 30 Minutes Intravenous Every 48 hours 05/06/17 1032 May 17, 2017 1416   05/05/17 2030  vancomycin (VANCOCIN) IVPB 1000 mg/200 mL premix  Status:  Discontinued     1,000 mg 200 mL/hr over 60 Minutes Intravenous Every 48 hours 05/15/2017 2020 05/05/17 1129   05/05/17 1300  vancomycin (VANCOCIN) IVPB 1000 mg/200 mL premix  Status:  Discontinued     1,000 mg 200 mL/hr over 60 Minutes Intravenous Every 24 hours 05/05/17 1129 05/06/17 0915   05/04/17 0030  aztreonam (AZACTAM) 1 g in dextrose 5 % 50 mL IVPB  Status:  Discontinued     1 g 100 mL/hr over 30 Minutes Intravenous Every 8 hours 06/02/2017 2020 05/04/17 0741   05/04/2017 2030  vancomycin (VANCOCIN) 1,500 mg in sodium chloride 0.9 % 500 mL IVPB     1,500 mg 250 mL/hr over 120 Minutes Intravenous  Once 05/11/2017 2017 05/04/17 0013   05/06/2017 2000  aztreonam (AZACTAM) 2 g in dextrose 5 % 50 mL IVPB  Status:  Discontinued     2 g 100 mL/hr over 30 Minutes Intravenous  Once 05/16/2017 1954 05/10/2017 2018   05/08/2017 2000  vancomycin (VANCOCIN) IVPB 1000 mg/200 mL premix  Status:  Discontinued     1,000 mg 200 mL/hr over 60 Minutes Intravenous  Once 05/28/2017 1954 05/29/2017 2016   05/21/2017 1630  aztreonam (AZACTAM) 2 g in dextrose 5 % 50 mL IVPB     2 g 100 mL/hr over 30 Minutes Intravenous  Once 06/02/2017 1618 05/12/2017 1721   05/12/2017 1615  vancomycin (VANCOCIN) 2,000 mg in sodium chloride 0.9 % 500 mL IVPB  Status:  Discontinued     2,000 mg 250 mL/hr over 120 Minutes Intravenous  Once 06/02/2017 1605 05/30/2017 1954   05/30/2017 1615  ceFEPIme (MAXIPIME) 1 g in dextrose 5 % 50 mL IVPB  Status:  Discontinued     1 g 100 mL/hr over 30 Minutes Intravenous  Once 05/27/2017 1605 05/22/2017 1616       Medications: Scheduled Meds: . calcitonin (salmon)  1 spray Alternating Nares Daily  . Chlorhexidine Gluconate Cloth  6 each Topical Q0600  . mouth rinse  15 mL Mouth Rinse BID  . mupirocin ointment  1 application Nasal BID   Continuous Infusions: . sodium chloride 500 mL (05/17/2017 1505)  . morphine 1 mg/hr (May 17, 2017 1503)   PRN Meds:.acetaminophen, glycopyrrolate, LORazepam, morphine, [DISCONTINUED] ondansetron **OR** ondansetron (ZOFRAN) IV    Objective: Weight change: -8 lb 6.6 oz (-3.816 kg)  Intake/Output Summary (Last 24 hours) at 2017/05/17 2000 Last data filed at 2017-05-17 1954  Gross per 24 hour  Intake           220.17 ml  Output              610 ml  Net          -389.83 ml   Blood pressure (!) 104/55, pulse 81, temperature 98.1 F (36.7 C), temperature source Oral, resp. rate (!) 30, height 5\' 2"  (1.575 m), weight 190 lb (86.2 kg), last menstrual period 08/03/1978, SpO2 (!) 89 %. Temp:  [98.1 F (36.7 C)-101.9 F (38.8 C)] 98.1 F (36.7  C) (10/05 1954) Pulse Rate:  [63-116] 81 (10/05 1954) Resp:  [21-39] 30 (10/05 1954) BP: (97-149)/(38-87) 104/55 (10/05 1954) SpO2:  [89 %-99 %] 89 % (10/05 1954) Weight:  [190 lb (86.2 kg)] 190 lb (86.2 kg) (10/05 0208)  Physical Exam: General: pt unresponsive and on [place] Neuro: nonfocal  CBC: CBC Latest Ref Rng & Units 2017-05-09 05/06/2017 05/05/2017  WBC 4.0 - 10.5 K/uL 17.6(H) 14.7(H) 15.1(H)  Hemoglobin 12.0 - 15.0 g/dL 8.0(L) 7.6(L) 8.1(L)  Hematocrit 36.0 - 46.0 % 24.4(L) 22.4(L) 25.5(L)  Platelets 150 - 400 K/uL 188 178 203      BMET  Recent Labs  05/06/17 0527 2017-05-09 0546  NA 125* 127*  K 4.7 4.4  CL 89* 90*  CO2 23 23  GLUCOSE 105* 120*  BUN 72* 89*  CREATININE 2.49* 2.37*  CALCIUM 7.6* 7.9*     Liver Panel   Recent Labs  05/05/17 0652  PROT 5.3*  ALBUMIN 2.1*  AST 15  ALT 13*  ALKPHOS 60  BILITOT 0.9       Sedimentation Rate No results for input(s): ESRSEDRATE in the last  72 hours. C-Reactive Protein No results for input(s): CRP in the last 72 hours.  Micro Results: Recent Results (from the past 720 hour(s))  Blood Culture (routine x 2)     Status: Abnormal   Collection Time: 05/21/2017  4:12 PM  Result Value Ref Range Status   Specimen Description BLOOD RIGHT ANTECUBITAL  Final   Special Requests   Final    BOTTLES DRAWN AEROBIC AND ANAEROBIC Blood Culture adequate volume   Culture  Setup Time   Final    GRAM POSITIVE COCCI IN CLUSTERS IN BOTH AEROBIC AND ANAEROBIC BOTTLES CRITICAL RESULT CALLED TO, READ BACK BY AND VERIFIED WITH: G.ABBOTT PHARMD 05/04/17 0643 L.CHAMPION    Culture METHICILLIN RESISTANT STAPHYLOCOCCUS AUREUS (A)  Final   Report Status 05/06/2017 FINAL  Final   Organism ID, Bacteria METHICILLIN RESISTANT STAPHYLOCOCCUS AUREUS  Final      Susceptibility   Methicillin resistant staphylococcus aureus - MIC*    CIPROFLOXACIN >=8 RESISTANT Resistant     ERYTHROMYCIN >=8 RESISTANT Resistant     GENTAMICIN <=0.5 SENSITIVE Sensitive     OXACILLIN >=4 RESISTANT Resistant     TETRACYCLINE <=1 SENSITIVE Sensitive     VANCOMYCIN <=0.5 SENSITIVE Sensitive     TRIMETH/SULFA <=10 SENSITIVE Sensitive     CLINDAMYCIN <=0.25 SENSITIVE Sensitive     RIFAMPIN <=0.5 SENSITIVE Sensitive     Inducible Clindamycin NEGATIVE Sensitive     * METHICILLIN RESISTANT STAPHYLOCOCCUS AUREUS  Blood Culture ID Panel (Reflexed)     Status: Abnormal   Collection Time: 05/17/2017  4:12 PM  Result Value Ref Range Status   Enterococcus species NOT DETECTED NOT DETECTED Final   Listeria monocytogenes NOT DETECTED NOT DETECTED Final   Staphylococcus species DETECTED (A) NOT DETECTED Final    Comment: CRITICAL RESULT CALLED TO, READ BACK BY AND VERIFIED WITH: G.ABBOTT PHARMD 05/04/17 0643 L.CHAMPION    Staphylococcus aureus DETECTED (A) NOT DETECTED Final    Comment: Methicillin (oxacillin)-resistant Staphylococcus aureus (MRSA). MRSA is predictably resistant to  beta-lactam antibiotics (except ceftaroline). Preferred therapy is vancomycin unless clinically contraindicated. Patient requires contact precautions if  hospitalized. CRITICAL RESULT CALLED TO, READ BACK BY AND VERIFIED WITH: G.ABBOTT PHARMD 05/04/17 0643 L.CHAMPION    Methicillin resistance DETECTED (A) NOT DETECTED Final    Comment: CRITICAL RESULT CALLED TO, READ BACK BY AND VERIFIED WITH: G.ABBOTT PHARMD 05/04/17 0973  L.CHAMPION    Streptococcus species NOT DETECTED NOT DETECTED Final   Streptococcus agalactiae NOT DETECTED NOT DETECTED Final   Streptococcus pneumoniae NOT DETECTED NOT DETECTED Final   Streptococcus pyogenes NOT DETECTED NOT DETECTED Final   Acinetobacter baumannii NOT DETECTED NOT DETECTED Final   Enterobacteriaceae species NOT DETECTED NOT DETECTED Final   Enterobacter cloacae complex NOT DETECTED NOT DETECTED Final   Escherichia coli NOT DETECTED NOT DETECTED Final   Klebsiella oxytoca NOT DETECTED NOT DETECTED Final   Klebsiella pneumoniae NOT DETECTED NOT DETECTED Final   Proteus species NOT DETECTED NOT DETECTED Final   Serratia marcescens NOT DETECTED NOT DETECTED Final   Haemophilus influenzae NOT DETECTED NOT DETECTED Final   Neisseria meningitidis NOT DETECTED NOT DETECTED Final   Pseudomonas aeruginosa NOT DETECTED NOT DETECTED Final   Candida albicans NOT DETECTED NOT DETECTED Final   Candida glabrata NOT DETECTED NOT DETECTED Final   Candida krusei NOT DETECTED NOT DETECTED Final   Candida parapsilosis NOT DETECTED NOT DETECTED Final   Candida tropicalis NOT DETECTED NOT DETECTED Final  Blood Culture (routine x 2)     Status: Abnormal   Collection Time: 05/09/2017  4:25 PM  Result Value Ref Range Status   Specimen Description BLOOD RIGHT HAND  Final   Special Requests   Final    BOTTLES DRAWN AEROBIC AND ANAEROBIC Blood Culture adequate volume   Culture  Setup Time   Final    GRAM POSITIVE COCCI IN CLUSTERS IN BOTH AEROBIC AND ANAEROBIC  BOTTLES CRITICAL VALUE NOTED.  VALUE IS CONSISTENT WITH PREVIOUSLY REPORTED AND CALLED VALUE.    Culture (A)  Final    STAPHYLOCOCCUS AUREUS SUSCEPTIBILITIES PERFORMED ON PREVIOUS CULTURE WITHIN THE LAST 5 DAYS.    Report Status 05/06/2017 FINAL  Final  Urine culture     Status: Abnormal   Collection Time: 05/04/17  1:10 AM  Result Value Ref Range Status   Specimen Description URINE, CLEAN CATCH  Final   Special Requests NONE  Final   Culture <10,000 COLONIES/mL (A)  Final   Report Status 05/05/2017 FINAL  Final  MRSA PCR Screening     Status: Abnormal   Collection Time: 05/04/17  3:07 PM  Result Value Ref Range Status   MRSA by PCR POSITIVE (A) NEGATIVE Final    Comment:        The GeneXpert MRSA Assay (FDA approved for NASAL specimens only), is one component of a comprehensive MRSA colonization surveillance program. It is not intended to diagnose MRSA infection nor to guide or monitor treatment for MRSA infections. RESULT CALLED TO, READ BACK BY AND VERIFIED WITH: Candiss Norse RN 17:40 05/04/17 ([email or web address])   Culture, blood (routine x 2)     Status: Abnormal   Collection Time: 05/05/17  6:52 AM  Result Value Ref Range Status   Specimen Description BLOOD RIGHT HAND  Final   Special Requests IN PEDIATRIC BOTTLE Blood Culture adequate volume  Final   Culture  Setup Time   Final    GRAM POSITIVE COCCI IN CLUSTERS IN PEDIATRIC BOTTLE CRITICAL VALUE NOTED.  VALUE IS CONSISTENT WITH PREVIOUSLY REPORTED AND CALLED VALUE.    Culture (A)  Final    STAPHYLOCOCCUS AUREUS SUSCEPTIBILITIES PERFORMED ON PREVIOUS CULTURE WITHIN THE LAST 5 DAYS.    Report Status 05/06/2017 FINAL  Final  Culture, blood (routine x 2)     Status: Abnormal   Collection Time: 05/05/17  6:58 AM  Result Value Ref Range Status   Specimen Description BLOOD  RIGHT THUMB  Final   Special Requests IN PEDIATRIC BOTTLE Blood Culture adequate volume  Final   Culture  Setup Time   Final    GRAM POSITIVE COCCI IN  CLUSTERS IN PEDIATRIC BOTTLE CRITICAL VALUE NOTED.  VALUE IS CONSISTENT WITH PREVIOUSLY REPORTED AND CALLED VALUE.    Culture (A)  Final    STAPHYLOCOCCUS AUREUS SUSCEPTIBILITIES PERFORMED ON PREVIOUS CULTURE WITHIN THE LAST 5 DAYS.    Report Status 05/06/2017 FINAL  Final  Culture, blood (Routine X 2) w Reflex to ID Panel     Status: None (Preliminary result)   Collection Time: 05/06/17  8:42 AM  Result Value Ref Range Status   Specimen Description BLOOD RIGHT HAND  Final   Special Requests   Final    BOTTLES DRAWN AEROBIC AND ANAEROBIC Blood Culture adequate volume   Culture NO GROWTH 1 DAY  Final   Report Status PENDING  Incomplete  Culture, blood (Routine X 2) w Reflex to ID Panel     Status: None (Preliminary result)   Collection Time: 05/06/17  8:42 AM  Result Value Ref Range Status   Specimen Description BLOOD RIGHT ANTECUBITAL  Final   Special Requests   Final    BOTTLES DRAWN AEROBIC AND ANAEROBIC Blood Culture adequate volume   Culture  Setup Time   Final    GRAM POSITIVE COCCI IN CLUSTERS ANAEROBIC BOTTLE ONLY CRITICAL RESULT CALLED TO, READ BACK BY AND VERIFIED WITH: M.MACCIA PHARMD 2017/05/22 0738 L.CHAMPION    Culture GRAM POSITIVE COCCI IN CLUSTERS  Final   Report Status PENDING  Incomplete    Studies/Results: No results found.    Assessment/Plan:  INTERVAL HISTORY: patients status continues to worsen   Principal Problem:   Sepsis ([place]) Active Problems:   Anxiety   Essential hypertension   LBBB (left bundle branch block)   Frequent UTI   Obesity   Hyponatremia   Chronic combined systolic and diastolic heart failure (HCC)   Hyperkalemia   Acute hypoxemic respiratory failure (HCC)   Septic shock (HCC)   MRSA bacteremia   Fracture of anatomical neck of humerus, right, closed, initial encounter   Stable burst fracture of T10 vertebra with delayed healing   New onset atrial fibrillation ([place])   Palliative care by specialist    Kimberly [name] is a 83  y.o. female with  MRSA bactermia after fall with humerus fracture  #1 MRSA bacteremia with septic shock , renal failure and respiratory failure=:  I had EXTENDED conversation with the daughter and her husband re Mrs Zylka.  I spent greater than an hour including >50% of time face to face counselling of the patients surrogate, her daughter re fact that her mother's life was likely coming to an end and advocating for palliative care consult. I personally called Dr. Hilma Favors to expedite palliative care consult and patient has subsequently been been seen by them and transitioned to pure palliative care.      LOS: 4 days   Alcide Evener 05/22/17, 8:00 PM

## 2017-06-03 NOTE — Consult Note (Signed)
Consultation Note Date: 05/26/17   Patient Name: Kimberly [name]  DOB: Dec 28, 1927  MRN: [id number]  Age / Sex: 83 y.o., female  PCP: Tower, Wynelle Fanny, MD Referring Physician: Caren Griffins, MD  Reason for Consultation: Establishing goals of care, Non pain symptom management, Pain control and Psychosocial/spiritual support  HPI/Patient Profile: 83 y.o. female  with past medical history of Coronary artery disease, dementia, systolic heart failure with an ejection fraction of 30-35%  admitted on 05/19/2017 with altered mental status, shortness of breath. Upon admission patient was found to have positive MRSA bacteremia, with new onset of A. fib with RVR. Additionally her creatinine is worsening and is now 2.37 (baseline 1.1); as well as worsening leukocytosis, 17.6. Patient has been followed by cardiology and at this point endocarditis cannot be excluded, patient needs a TEE. Patient was febrile today, 101.9 and she is unable to take oral medications. She is moaning and responding only to touch.   Clinical Assessment and Goals of Care: Patient is unable to participate in assessment secondary to acuity of condition. She is moaning and appears to be both in pain as well as increased work of breathing. Despite antibiotics, IV fluids, oxygen and other supportive therapies patient has continued to deteriorate in terms of mental status as well as kidney function and leukocytosis.  Kimberly [name], is patient's daughter, and would be her healthcare proxy at this time. [phone]  Daughter reports a sharp decline over the past year. Pt's son died from cancer 66 months ago. She verbalizes comfort is her main focus and not to prolong her mother's suffering. She also describes her mother as having nearing death awareness; seeing her deceased sons, asking if it is time to go    SUMMARY OF RECOMMENDATIONS   Confirmed DO NOT  RESUSCITATE/DO NOT INTUBATE Comfort Care Started MS04 continuous infusion for pain as well as dyspnea Transfer to 6 N Stop abx and amiodarone as well as PO meds and labs, telelemtry Code Status/Advance Care Planning:  DNR    Symptom Management:  Pain: Start morphine continuous infusion at 1 mg an hour and 2 mg every 20 minutes as needed. Monitor and titrate for effect Dyspnea: No further BiPAP; O2 via nasal cannula, Lasix when necessary; primary management tool will be morphine continuous infusion Secretions: Robinul 0.2-0.4 IV every 6 as needed Anxiety: Ativan 0.5-1 mg every 6 hours as needed Palliative Prophylaxis:   Aspiration, Bowel Regimen, Delirium Protocol, Eye Care, Frequent Pain Assessment, Oral Care and Turn Reposition  Additional Recommendations (Limitations, Scope, Preferences):  Full Comfort Care  Psycho-social/Spiritual:   Desire for further Chaplaincy support:no  Additional Recommendations: Grief/Bereavement Support  Prognosis:   Hours - Days  Discharge Planning: Anticipated Hospital Death      Primary Diagnoses: Present on Admission: . Sepsis ([place]) . Anxiety . Chronic combined systolic and diastolic heart failure ([place]) . Essential hypertension . Frequent UTI . Hyponatremia . LBBB (left bundle branch block) . Hyperkalemia . Acute hypoxemic respiratory failure ([place]) . Obesity   I have reviewed the  medical record, interviewed the patient and family, and examined the patient. The following aspects are pertinent.  Past Medical History:  Diagnosis Date  . Allergy    allergic rhinitis  . Chronic combined systolic and diastolic CHF (congestive heart failure) ([place])    a. 01/2014 Echo: EF 50-55%, no rwma, Gr1 DD, triv AI, mild MR;  b. 01/2017 Echo: EF 35%, diff HK, gr2  DD, fibrocalcific AoV w/o AS, mild AI/MR, sev dil LA, PASP 56mmHg.  . Fracture, humerus 04/2017  . GERD (gastroesophageal reflux disease)   . Hyperkalemia   . Hyperlipidemia   .  Hypertension   . Hyponatremia   . Hypothyroid   . Hypoxia 05/28/2017  . Insomnia   . Non-obstructive CAD (coronary artery disease)    a. Non-obstructive dzs by caths in 2001 and 2006;  b. 03/2017 Cath: LM nl, LAD 30p, LCX nl, RCA 71m/d, EF 45-50%.  . Sepsis ([place]) 05/12/2017   Social History   Social History  . Marital status: Widowed    Spouse name: N/A  . Number of children: N/A  . Years of education: N/A   Social History Main Topics  . Smoking status: Never Smoker  . Smokeless tobacco: Never Used  . Alcohol use No  . Drug use: No  . Sexual activity: No   Other Topics Concern  . None   Social History Narrative  . None   Family History  Problem Relation Age of Onset  . Heart disease Mother   . Hypertension Mother    Scheduled Meds: . aspirin EC  81 mg Oral Daily  . calcitonin (salmon)  1 spray Alternating Nares Daily  . Chlorhexidine Gluconate Cloth  6 each Topical Q0600  . heparin  5,000 Units Subcutaneous Q8H  . levothyroxine  25 mcg Oral QAC breakfast  . mouth rinse  15 mL Mouth Rinse BID  . mupirocin ointment  1 application Nasal BID  . senna-docusate  1 tablet Oral BID   Continuous Infusions: . amiodarone 30 mg/hr (06/01/2017 1240)  . DAPTOmycin (CUBICIN)  IV Stopped (05/06/17 1300)   PRN Meds:.acetaminophen, ondansetron **OR** ondansetron (ZOFRAN) IV, oxyCODONE, polyethylene glycol, traMADol Medications Prior to Admission:  Prior to Admission medications   Medication Sig Start Date End Date Taking? Authorizing Provider  aspirin EC 81 MG tablet Take 81 mg by mouth daily.   Yes [provider]  calcitonin, salmon, (MIACALCIN/FORTICAL) 200 UNIT/ACT nasal spray Place 1 spray into alternate nostrils daily.   Yes [provider]  carvedilol (COREG) 3.125 MG tablet Take 1 tablet (3.125 mg total) by mouth 2 (two) times daily with a meal. Please keep 05/20/17 appt for future refills 04/27/17  Yes Gollan, Kathlene November, MD  cetirizine (ZYRTEC) 10 MG  tablet Take 10 mg by mouth daily as needed for allergies.    Yes [provider]  docusate sodium 100 MG CAPS Take 100 mg by mouth 2 (two) times daily. For constipation 02/02/14  Yes Rai, Ripudeep K, MD  esomeprazole (NEXIUM) 40 MG capsule TAKE 1 CAPSULE (40 MG TOTAL) BY MOUTH DAILY. 10/12/16  Yes Tower, Wynelle Fanny, MD  fluticasone (FLONASE) 50 MCG/ACT nasal spray Place 2 sprays into both nostrils daily. 04/15/17  Yes Ria Bush, MD  furosemide (LASIX) 40 MG tablet Take 1 tablet (40 mg total) by mouth 2 (two) times daily. Patient taking differently: Take 40 mg by mouth See admin instructions. Take 1 tablet (40 mg) by mouth twice daily - at 8:30am and 1:30pm 05/01/17  Yes Sudini,  Srikar, MD  guaiFENesin (MUCINEX) 600 MG 12 hr tablet Take 600 mg by mouth 2 (two) times daily as needed for cough or to loosen phlegm.    Yes [provider]  levothyroxine (SYNTHROID, LEVOTHROID) 25 MCG tablet Take 1 tablet (25 mcg total) by mouth daily. Patient taking differently: Take 25 mcg by mouth daily before breakfast.  06/16/16  Yes Tower, Wynelle Fanny, MD  losartan (COZAAR) 25 MG tablet TAKE 1 TABLET BY MOUTH EVERY DAY Patient taking differently: TAKE 1 TABLET (25 MG) BY MOUTH EVERY DAY 03/25/17  Yes Gollan, Kathlene November, MD  Multiple Vitamin (MULTIVITAMIN WITH MINERALS) TABS tablet Take 1 tablet by mouth daily.   Yes [provider]  oxyCODONE (OXY IR/ROXICODONE) 5 MG immediate release tablet Take 1 tablet (5 mg total) by mouth every 6 (six) hours as needed for moderate pain or severe pain. Patient taking differently: Take 5 mg by mouth every 4 (four) hours as needed for severe pain.  05/01/17  Yes Sudini, Alveta Heimlich, MD  potassium chloride (K-DUR) 10 MEQ tablet Take 1 tablet (10 mEq total) by mouth daily. 05/01/17  Yes Sudini, Alveta Heimlich, MD  zolpidem (AMBIEN) 10 MG tablet TAKE 1/2 TO 1 TABLET BY MOUTH DAILY AT BEDTIME AS NEEDED Patient taking differently: TAKE 1/2 TO 1 TABLET (5-10 MG) BY MOUTH DAILY AT  BEDTIME AS NEEDED FOR SLEEP 02/18/17  Yes Tower, Wynelle Fanny, MD   Allergies  Allergen Reactions  . Ciprofloxacin Other (See Comments)    REACTION: burning of mouth and skin  . Lisinopril Cough  . Remeron [Mirtazapine] Other (See Comments)    constipation  . Sulfonamide Derivatives Other (See Comments)    REACTION: malaise  . Tetanus Toxoid Swelling  . Vesicare [Solifenacin Succinate] Other (See Comments)    Blurred vision and weakness  . Zoloft [Sertraline Hcl] Nausea And Vomiting  . Cephalexin Rash and Other (See Comments)    REACTION: rash and burning sensation to skin on arms and legs with itching.  Madelin Headings [Amitriptyline] Palpitations  . Penicillins Rash and Other (See Comments)    Has patient had a PCN reaction causing immediate rash, facial/tongue/throat swelling, SOB or lightheadedness with hypotension: Unknown Has patient had a PCN reaction causing severe rash involving mucus membranes or skin necrosis: Unknown Has patient had a PCN reaction that required hospitalization: Unknown Has patient had a PCN reaction occurring within the last 10 years: No If all of the above answers are "NO", then may proceed with Cephalosporin use. Has since take Augmentin with no problem   Review of Systems  Unable to perform ROS: Acuity of condition    Physical Exam  Constitutional:  Acutely ill appearing elderly female. She is grimacing, increased work of breathing at rest; currently off of BiPAP  HENT:  Head: Normocephalic and atraumatic.  Cardiovascular:  Tachycardic  Pulmonary/Chest:  Increased work of breathing at rest. Currently off of BiPAP  Neurological:  Minimally responsive; moaning, grimacing  Skin:  Cool  Psychiatric:  Unable to test  Nursing note and vitals reviewed.   Vital Signs: BP 118/66   Pulse 96   Temp 98.4 F (36.9 C) (Axillary)   Resp (!) 28   Ht 5\' 2"  (1.575 m)   Wt 86.2 kg (190 lb)   LMP 08/03/1978   SpO2 95%   BMI 34.75 kg/m  Pain Assessment: CPOT    Pain Score: Asleep (pt. resting with eyes closed)   SpO2: SpO2: 95 % O2 Device:SpO2: 95 % O2 Flow Rate: .O2 Flow  Rate (L/min): 4 L/min  IO: Intake/output summary:  Intake/Output Summary (Last 24 hours) at 06-01-2017 1307 Last data filed at 06/01/2017 0600  Gross per 24 hour  Intake           649.41 ml  Output              700 ml  Net           -50.59 ml    LBM: Last BM Date: 05/05/17 Baseline Weight: Weight: 84.2 kg (185 lb 11.2 oz) Most recent weight: Weight: 86.2 kg (190 lb)     Palliative Assessment/Data:   Flowsheet Rows     Most Recent Value  Intake Tab  Referral Department  Hospitalist  Unit at Time of Referral  Intermediate Care Unit  Palliative Care Primary Diagnosis  Sepsis/Infectious Disease  Date Notified  05/06/17  Palliative Care Type  New Palliative care  Reason for referral  Clarify Goals of Care  Date of Admission  05/22/2017  Date first seen by Palliative Care  2017/06/01  # of days Palliative referral response time  1 Day(s)  # of days IP prior to Palliative referral  3  Clinical Assessment  Palliative Performance Scale Score  30%  Pain Max last 24 hours  Not able to report  Pain Min Last 24 hours  Not able to report  Dyspnea Max Last 24 Hours  Not able to report  Dyspnea Min Last 24 hours  Not able to report  Nausea Max Last 24 Hours  Not able to report  Nausea Min Last 24 Hours  Not able to report  Anxiety Max Last 24 Hours  Not able to report  Anxiety Min Last 24 Hours  Not able to report  Other Max Last 24 Hours  Not able to report  Psychosocial & Spiritual Assessment  Palliative Care Outcomes  Patient/Family meeting held?  Yes      Time In: 1300 Time Out: 1415 Time Total: 75 min Greater than 50%  of this time was spent counseling and coordinating care related to the above assessment and plan. Staffed with Dr. Cruzita Lederer  Signed by: Dory Horn, NP   Please contact Palliative Medicine Team phone at [phone] for questions and concerns.    For individual provider: See Shea Evans

## 2017-06-03 DEATH — deceased

## 2017-06-11 ENCOUNTER — Ambulatory Visit: Payer: Medicare Other

## 2017-06-18 ENCOUNTER — Encounter: Payer: Medicare Other | Admitting: Family Medicine

## 2019-01-20 IMAGING — CR DG CHEST 2V
2 series · 2 of 2 positions shown · non-contrast
Comparison: 04/27/2014, ultrasound 08/04/2012

CLINICAL DATA: Sinus congestion shortness of breath

EXAM:
CHEST  2 VIEW

[chest pa]
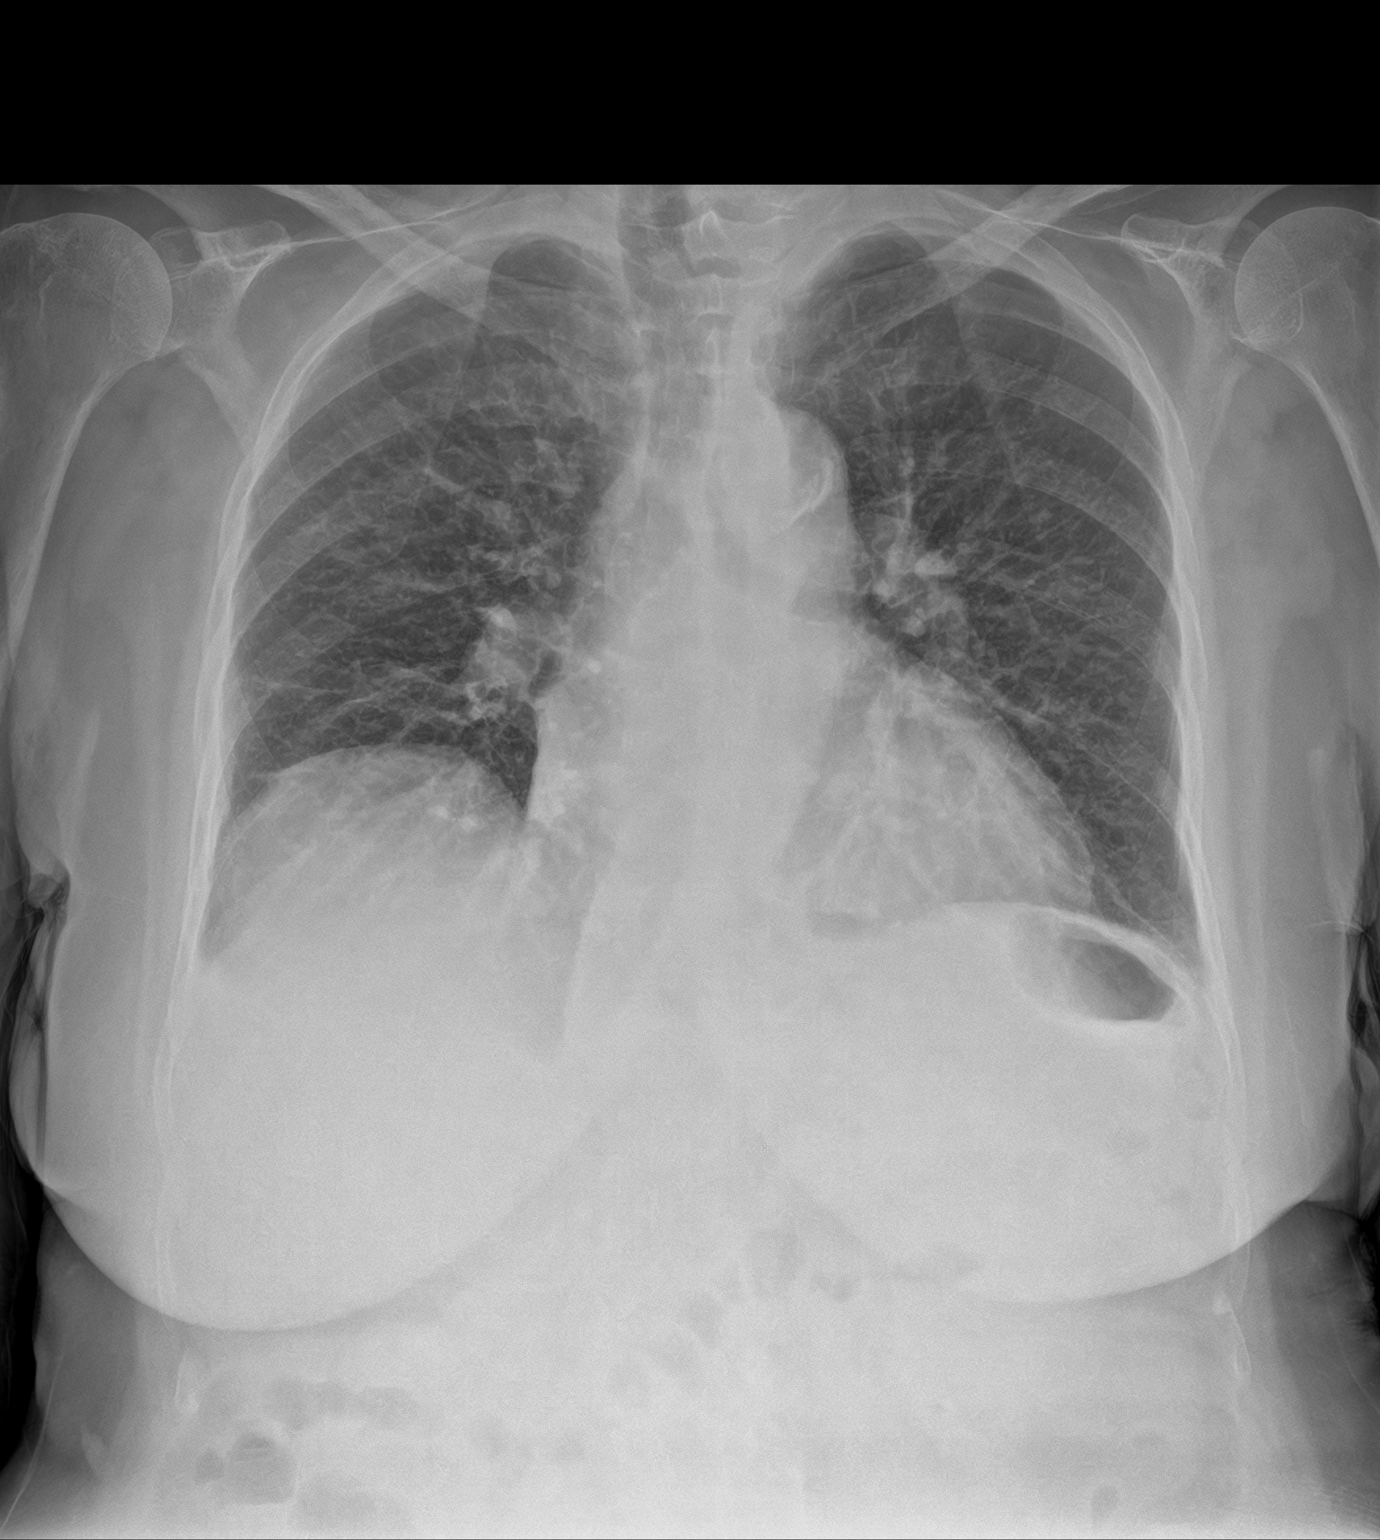

[chest lat]
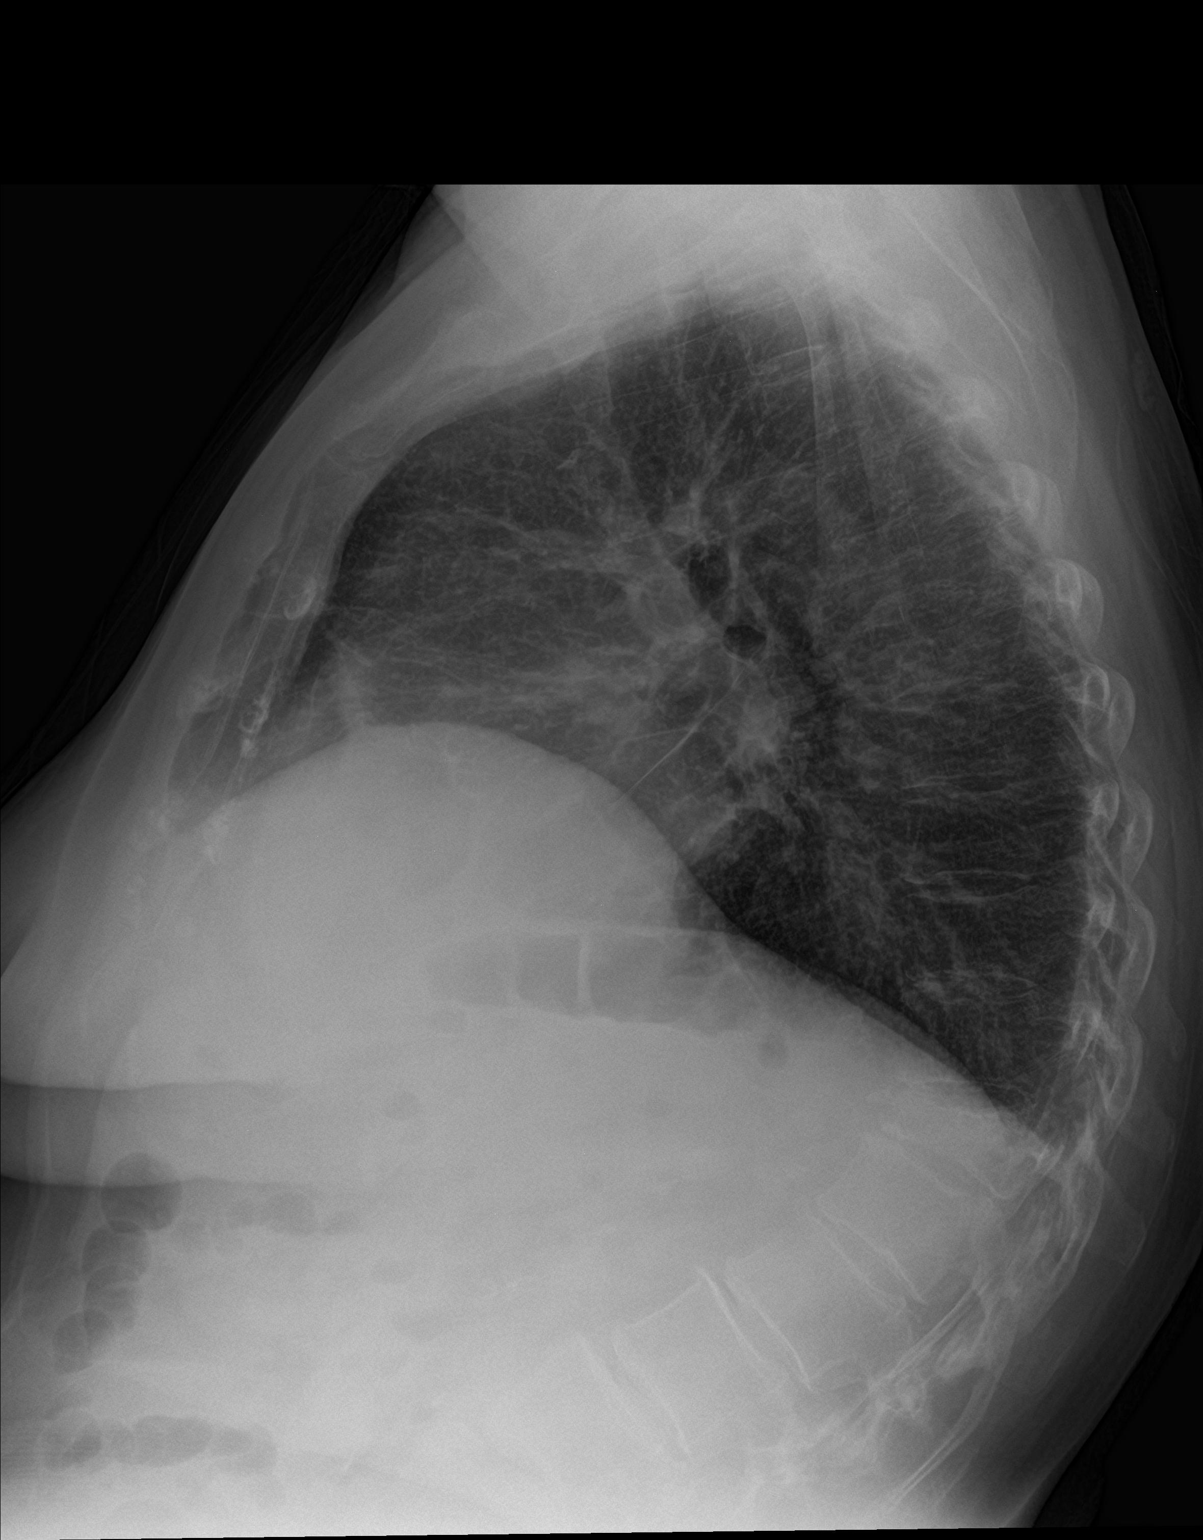

[2 of 2 positions shown; findings below may reference images not displayed]

FINDINGS: Low lung volumes with chronic elevation and lobulation of right
diaphragm. Hyperinflation. No focal consolidation or pleural
effusion is seen. Coarse diffuse interstitial opacity. Borderline
cardiomegaly with atherosclerosis. Tracheal deviation to the right,
likely corresponding to ultrasound demonstrated left thyroid mass.
No pneumothorax. Moderate lower thoracic compression, about 40% loss
of height anteriorly, progressed since prior radiograph.
IMPRESSION: 1. Diffuse coarse interstitial pulmonary opacity could relate to
bronchial inflammation. No focal infiltrate is seen
2. Borderline cardiomegaly
3. Progressed compression fracture of the lower thoracic spine.

## 2019-01-23 IMAGING — CR DG CHEST 2V
1 series · 2 of 2 positions shown · non-contrast
Comparison: 02/26/2017.

CLINICAL DATA: Lower extremity edema.  Shortness of breath .

EXAM:
CHEST  2 VIEW

[Series 1: dg chest 2 view · 0.14mm/px · 2 of 2 slices shown]
[im 1/2]
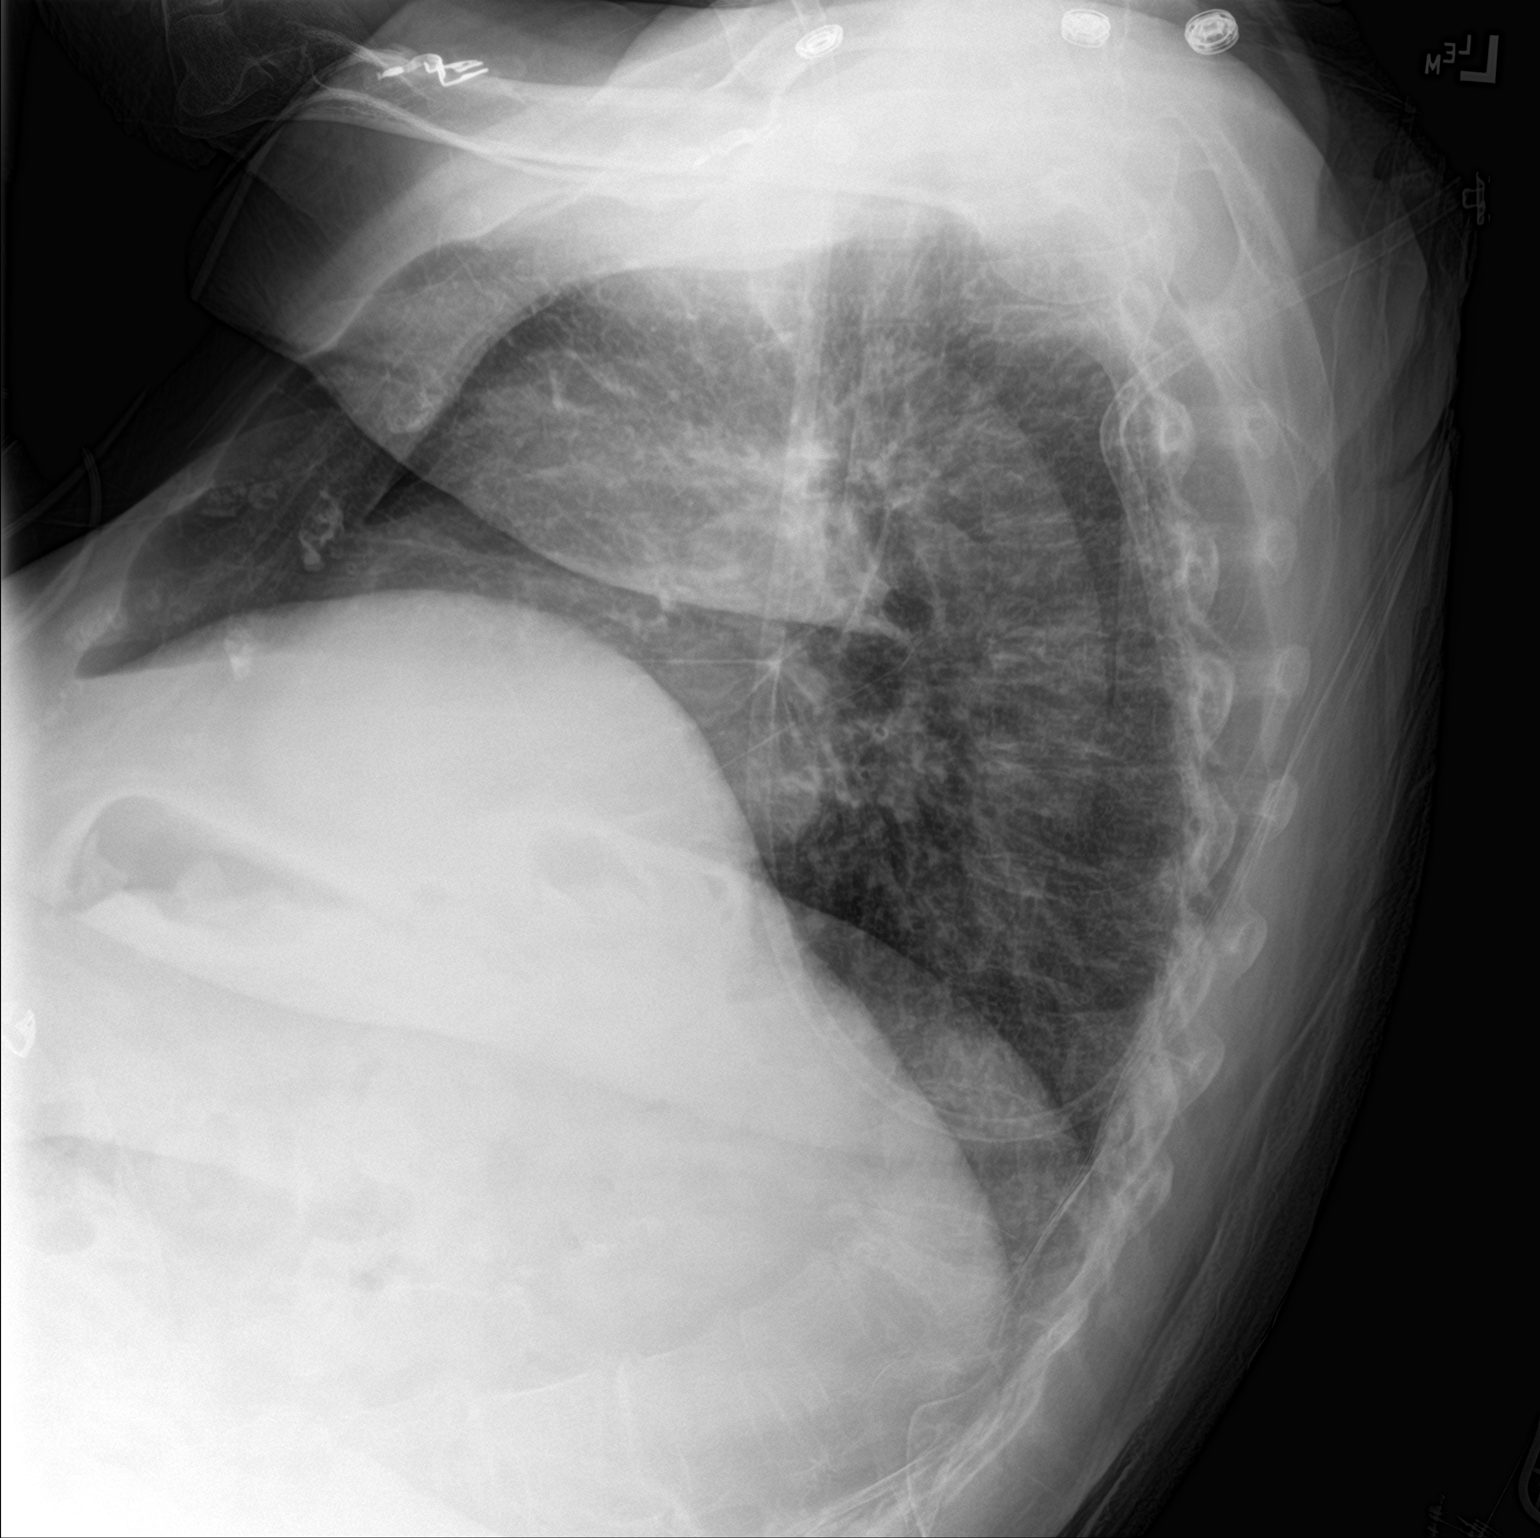
[im 2/2]
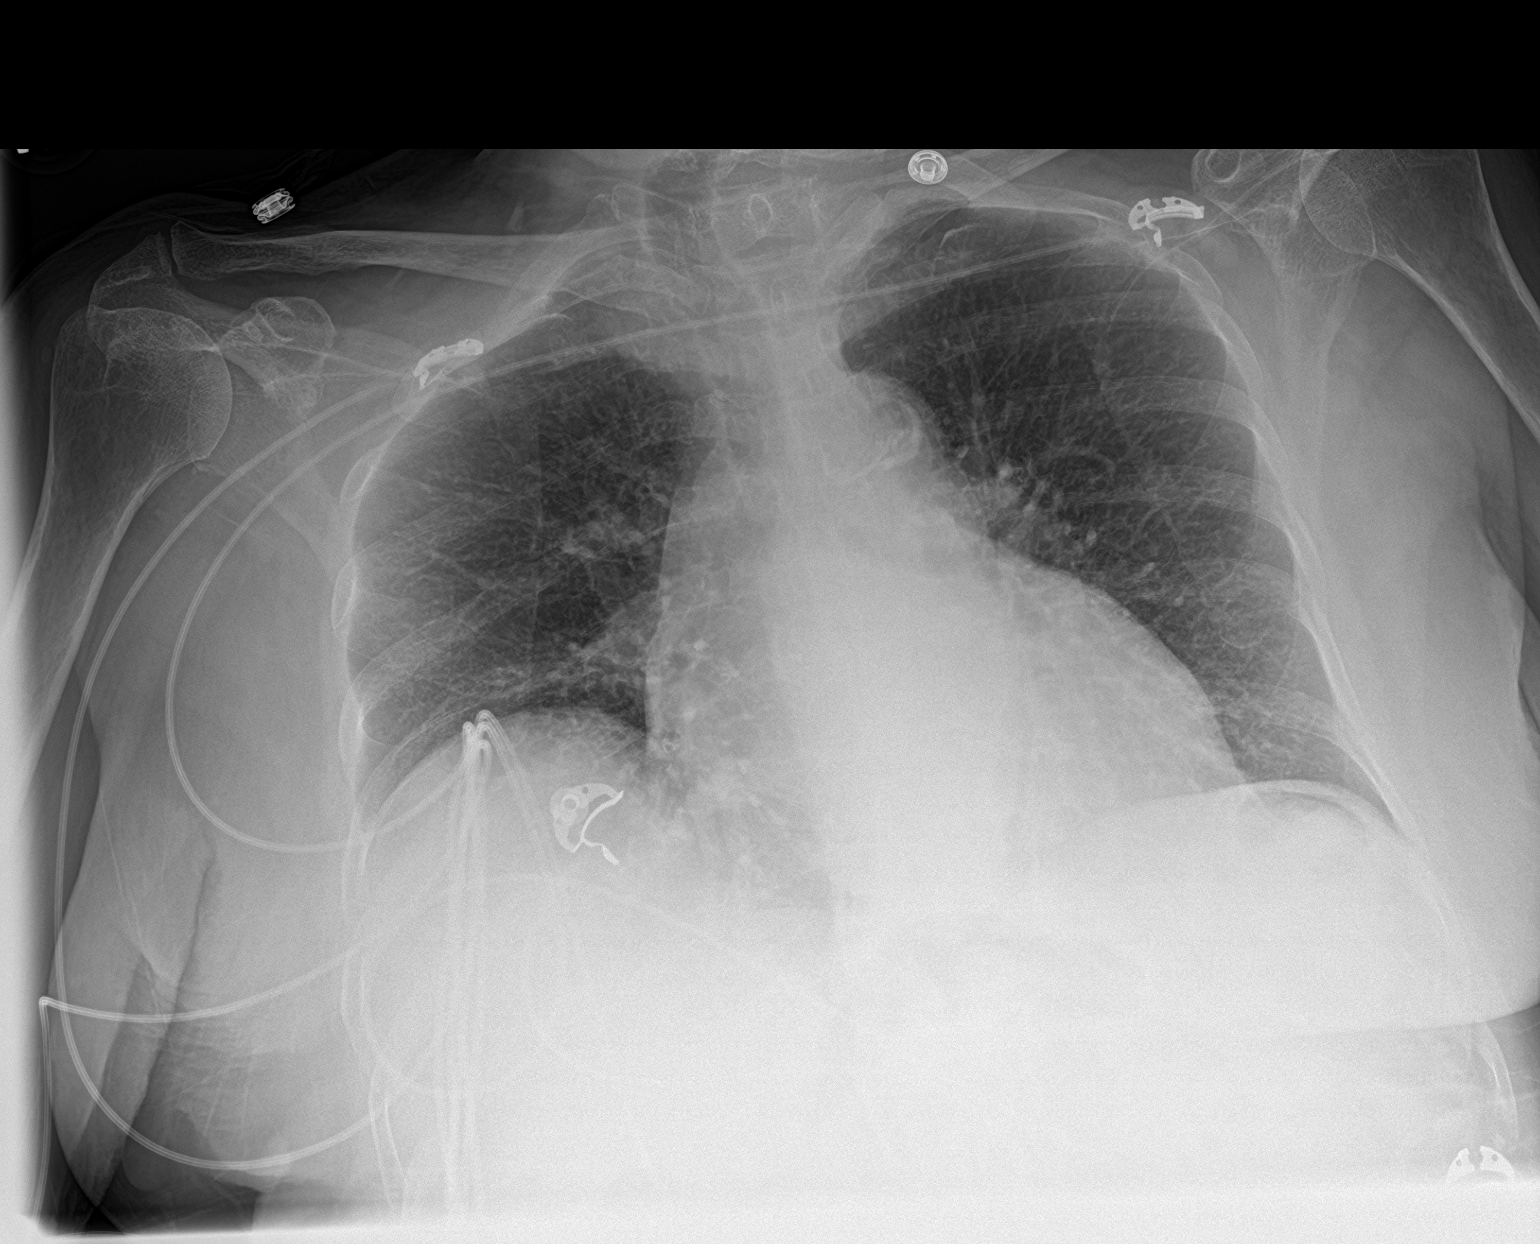

[2 of 2 positions shown; findings below may reference images not displayed]

FINDINGS: Cardiomegaly. Pulmonary vascularity is normal. Low lung volumes. No
focal infiltrate. No pleural effusion or pneumothorax. No acute bony
abnormality . Further compression of lower thoracic vertebral body
compression fracture.
IMPRESSION: 1. Interval improvement of right base atelectasis. Heart size
stable. No pulmonary venous congestion .

2. Further compression of lower thoracic vertebral body compression
fracture .
